# Patient Record
Sex: Male | Born: 1935
Health system: Southern US, Community
[De-identification: ages and names within clinical notes are randomized; demographics above are authoritative.]

## PROBLEM LIST (undated history)

## (undated) DIAGNOSIS — I714 Abdominal aortic aneurysm, without rupture, unspecified: Secondary | ICD-10-CM

## (undated) DIAGNOSIS — I493 Ventricular premature depolarization: Secondary | ICD-10-CM

## (undated) DIAGNOSIS — I4891 Unspecified atrial fibrillation: Secondary | ICD-10-CM

## (undated) DIAGNOSIS — N529 Male erectile dysfunction, unspecified: Secondary | ICD-10-CM

## (undated) DIAGNOSIS — I4510 Unspecified right bundle-branch block: Secondary | ICD-10-CM

## (undated) DIAGNOSIS — E785 Hyperlipidemia, unspecified: Secondary | ICD-10-CM

## (undated) DIAGNOSIS — I499 Cardiac arrhythmia, unspecified: Secondary | ICD-10-CM

## (undated) DIAGNOSIS — I77810 Thoracic aortic ectasia: Secondary | ICD-10-CM

## (undated) DIAGNOSIS — E669 Obesity, unspecified: Secondary | ICD-10-CM

## (undated) DIAGNOSIS — G4730 Sleep apnea, unspecified: Secondary | ICD-10-CM

## (undated) DIAGNOSIS — Z95828 Presence of other vascular implants and grafts: Secondary | ICD-10-CM

## (undated) DIAGNOSIS — I2510 Atherosclerotic heart disease of native coronary artery without angina pectoris: Secondary | ICD-10-CM

## (undated) DIAGNOSIS — Z8679 Personal history of other diseases of the circulatory system: Secondary | ICD-10-CM

## (undated) DIAGNOSIS — I429 Cardiomyopathy, unspecified: Secondary | ICD-10-CM

## (undated) DIAGNOSIS — Z9289 Personal history of other medical treatment: Secondary | ICD-10-CM

## (undated) DIAGNOSIS — R739 Hyperglycemia, unspecified: Secondary | ICD-10-CM

## (undated) DIAGNOSIS — K449 Diaphragmatic hernia without obstruction or gangrene: Secondary | ICD-10-CM

## (undated) DIAGNOSIS — E039 Hypothyroidism, unspecified: Secondary | ICD-10-CM

## (undated) DIAGNOSIS — N189 Chronic kidney disease, unspecified: Secondary | ICD-10-CM

## (undated) DIAGNOSIS — I10 Essential (primary) hypertension: Secondary | ICD-10-CM

## (undated) DIAGNOSIS — C679 Malignant neoplasm of bladder, unspecified: Secondary | ICD-10-CM

## (undated) DIAGNOSIS — M1990 Unspecified osteoarthritis, unspecified site: Secondary | ICD-10-CM

## (undated) DIAGNOSIS — C61 Malignant neoplasm of prostate: Secondary | ICD-10-CM

## (undated) DIAGNOSIS — J449 Chronic obstructive pulmonary disease, unspecified: Secondary | ICD-10-CM

## (undated) HISTORY — DX: Ventricular premature depolarization: I49.3

## (undated) HISTORY — DX: Atherosclerotic heart disease of native coronary artery without angina pectoris: I25.10

## (undated) HISTORY — DX: Cardiomyopathy, unspecified: I42.9

## (undated) HISTORY — DX: Essential (primary) hypertension: I10

## (undated) HISTORY — DX: Personal history of other medical treatment: Z92.89

## (undated) HISTORY — PX: CARDIOVASCULAR STRESS TEST: SHX262

## (undated) HISTORY — DX: Male erectile dysfunction, unspecified: N52.9

## (undated) HISTORY — DX: Abdominal aortic aneurysm, without rupture, unspecified: I71.40

## (undated) HISTORY — DX: Thoracic aortic ectasia: I77.810

## (undated) HISTORY — DX: Unspecified atrial fibrillation: I48.91

## (undated) HISTORY — DX: Unspecified right bundle-branch block: I45.10

## (undated) HISTORY — DX: Hyperglycemia, unspecified: R73.9

## (undated) HISTORY — DX: Hyperlipidemia, unspecified: E78.5

## (undated) HISTORY — DX: Hypothyroidism, unspecified: E03.9

## (undated) HISTORY — DX: Chronic obstructive pulmonary disease, unspecified: J44.9

## (undated) HISTORY — DX: Abdominal aortic aneurysm, without rupture: I71.4

## (undated) HISTORY — DX: Obesity, unspecified: E66.9

---

## 1990-02-28 HISTORY — PX: HIATAL HERNIA REPAIR: SHX195

## 1990-03-31 HISTORY — PX: CARDIAC CATHETERIZATION: SHX172

## 1990-05-30 HISTORY — PX: CORONARY ARTERY BYPASS GRAFT: SHX141

## 1995-03-01 HISTORY — PX: INGUINAL HERNIA REPAIR: SUR1180

## 1999-04-29 ENCOUNTER — Encounter: Payer: Self-pay | Admitting: Family Medicine

## 1999-04-29 LAB — CONVERTED CEMR LAB: PSA: 0.4 ng/mL

## 2000-04-28 ENCOUNTER — Encounter: Payer: Self-pay | Admitting: Family Medicine

## 2000-04-28 LAB — CONVERTED CEMR LAB: PSA: 0.33 ng/mL

## 2000-10-31 HISTORY — PX: OTHER SURGICAL HISTORY: SHX169

## 2001-05-29 ENCOUNTER — Encounter: Payer: Self-pay | Admitting: Family Medicine

## 2001-05-29 LAB — CONVERTED CEMR LAB: PSA: 0.3 ng/mL

## 2002-03-28 ENCOUNTER — Encounter: Payer: Self-pay | Admitting: Cardiology

## 2002-03-28 ENCOUNTER — Encounter: Payer: Self-pay | Admitting: Internal Medicine

## 2002-03-28 ENCOUNTER — Inpatient Hospital Stay (HOSPITAL_COMMUNITY): Admission: EM | Admit: 2002-03-28 | Discharge: 2002-03-29 | Payer: Self-pay | Admitting: Emergency Medicine

## 2002-03-28 HISTORY — PX: OTHER SURGICAL HISTORY: SHX169

## 2003-06-29 ENCOUNTER — Encounter: Payer: Self-pay | Admitting: Family Medicine

## 2003-06-29 LAB — CONVERTED CEMR LAB: PSA: 0.3 ng/mL

## 2003-12-31 ENCOUNTER — Ambulatory Visit: Payer: Self-pay | Admitting: Family Medicine

## 2004-06-28 ENCOUNTER — Ambulatory Visit: Payer: Self-pay | Admitting: Family Medicine

## 2004-06-28 LAB — CONVERTED CEMR LAB: PSA: 0.32 ng/mL

## 2004-06-29 ENCOUNTER — Ambulatory Visit: Payer: Self-pay | Admitting: Family Medicine

## 2004-07-09 ENCOUNTER — Ambulatory Visit: Payer: Self-pay | Admitting: Family Medicine

## 2005-01-04 ENCOUNTER — Ambulatory Visit: Payer: Self-pay | Admitting: Family Medicine

## 2005-07-05 ENCOUNTER — Ambulatory Visit: Payer: Self-pay | Admitting: Family Medicine

## 2005-07-05 LAB — CONVERTED CEMR LAB: PSA: 0.42 ng/mL

## 2005-07-06 ENCOUNTER — Ambulatory Visit: Payer: Self-pay | Admitting: Family Medicine

## 2005-10-24 ENCOUNTER — Ambulatory Visit: Payer: Self-pay | Admitting: Gastroenterology

## 2005-11-07 ENCOUNTER — Ambulatory Visit: Payer: Self-pay | Admitting: Gastroenterology

## 2006-01-09 ENCOUNTER — Ambulatory Visit: Payer: Self-pay | Admitting: Family Medicine

## 2006-06-29 ENCOUNTER — Encounter: Payer: Self-pay | Admitting: Family Medicine

## 2006-06-29 DIAGNOSIS — E039 Hypothyroidism, unspecified: Secondary | ICD-10-CM | POA: Insufficient documentation

## 2006-06-29 DIAGNOSIS — R739 Hyperglycemia, unspecified: Secondary | ICD-10-CM

## 2006-06-29 DIAGNOSIS — I10 Essential (primary) hypertension: Secondary | ICD-10-CM

## 2006-06-29 DIAGNOSIS — I2510 Atherosclerotic heart disease of native coronary artery without angina pectoris: Secondary | ICD-10-CM

## 2006-06-29 DIAGNOSIS — I4821 Permanent atrial fibrillation: Secondary | ICD-10-CM

## 2006-06-29 DIAGNOSIS — E669 Obesity, unspecified: Secondary | ICD-10-CM | POA: Insufficient documentation

## 2006-07-04 ENCOUNTER — Ambulatory Visit: Payer: Self-pay | Admitting: Family Medicine

## 2006-07-04 LAB — CONVERTED CEMR LAB
ALT: 15 units/L (ref 0–40)
AST: 15 units/L (ref 0–37)
Albumin: 3.8 g/dL (ref 3.5–5.2)
Alkaline Phosphatase: 63 units/L (ref 39–117)
BUN: 10 mg/dL (ref 6–23)
Bilirubin, Direct: 0.1 mg/dL (ref 0.0–0.3)
CO2: 28 meq/L (ref 19–32)
Calcium: 9.2 mg/dL (ref 8.4–10.5)
Chloride: 106 meq/L (ref 96–112)
Cholesterol: 168 mg/dL (ref 0–200)
Creatinine, Ser: 1 mg/dL (ref 0.4–1.5)
Creatinine,U: 155.3 mg/dL
Direct LDL: 94.3 mg/dL
Free T4: 0.7 ng/dL (ref 0.6–1.6)
GFR calc Af Amer: 95 mL/min
GFR calc non Af Amer: 79 mL/min
Glucose, Bld: 99 mg/dL (ref 70–99)
HDL: 32.8 mg/dL — ABNORMAL LOW (ref >39.0)
Microalb Creat Ratio: 5.8 mg/g (ref 0.0–30.0)
Microalb, Ur: 0.9 mg/dL (ref 0.0–1.9)
PSA: 0.44 ng/mL (ref 0.10–4.00)
Potassium: 4.7 meq/L (ref 3.5–5.1)
Sodium: 141 meq/L (ref 135–145)
TSH: 3.11 microintl units/mL (ref 0.35–5.50)
Total Bilirubin: 1.1 mg/dL (ref 0.3–1.2)
Total CHOL/HDL Ratio: 5.1
Total Protein: 7.4 g/dL (ref 6.0–8.3)
Triglycerides: 215 mg/dL (ref 0–149)
VLDL: 43 mg/dL — ABNORMAL HIGH (ref 0–40)

## 2006-07-11 ENCOUNTER — Ambulatory Visit: Payer: Self-pay | Admitting: Family Medicine

## 2006-09-27 ENCOUNTER — Ambulatory Visit: Payer: Self-pay | Admitting: Family Medicine

## 2007-10-09 ENCOUNTER — Ambulatory Visit: Payer: Self-pay | Admitting: Family Medicine

## 2007-10-11 LAB — CONVERTED CEMR LAB
ALT: 14 units/L (ref 0–53)
AST: 14 units/L (ref 0–37)
Albumin: 4 g/dL (ref 3.5–5.2)
Alkaline Phosphatase: 63 units/L (ref 39–117)
BUN: 13 mg/dL (ref 6–23)
Basophils Absolute: 0.1 10*3/uL (ref 0.0–0.1)
Basophils Relative: 0.8 % (ref 0.0–3.0)
Bilirubin, Direct: 0.2 mg/dL (ref 0.0–0.3)
CO2: 25 meq/L (ref 19–32)
Calcium: 9.2 mg/dL (ref 8.4–10.5)
Chloride: 106 meq/L (ref 96–112)
Cholesterol: 164 mg/dL (ref 0–200)
Creatinine, Ser: 1 mg/dL (ref 0.4–1.5)
Creatinine,U: 226.8 mg/dL
Eosinophils Absolute: 0.1 10*3/uL (ref 0.0–0.7)
Eosinophils Relative: 2.2 % (ref 0.0–5.0)
GFR calc Af Amer: 95 mL/min
GFR calc non Af Amer: 78 mL/min
Glucose, Bld: 97 mg/dL (ref 70–99)
HCT: 42.4 % (ref 39.0–52.0)
HDL: 32.9 mg/dL — ABNORMAL LOW (ref >39.0)
Hemoglobin: 14.6 g/dL (ref 13.0–17.0)
Hgb A1c MFr Bld: 6 % (ref 4.6–6.0)
LDL Cholesterol: 92 mg/dL (ref 0–99)
Lymphocytes Relative: 26.8 % (ref 12.0–46.0)
MCHC: 34.4 g/dL (ref 30.0–36.0)
MCV: 91.2 fL (ref 78.0–100.0)
Microalb Creat Ratio: 7.1 mg/g (ref 0.0–30.0)
Microalb, Ur: 1.6 mg/dL (ref 0.0–1.9)
Monocytes Absolute: 0.5 10*3/uL (ref 0.1–1.0)
Monocytes Relative: 8.3 % (ref 3.0–12.0)
Neutro Abs: 4 10*3/uL (ref 1.4–7.7)
Neutrophils Relative %: 61.9 % (ref 43.0–77.0)
PSA: 0.31 ng/mL (ref 0.10–4.00)
Platelets: 250 10*3/uL (ref 150–400)
Potassium: 4 meq/L (ref 3.5–5.1)
RBC: 4.65 M/uL (ref 4.22–5.81)
RDW: 13.9 % (ref 11.5–14.6)
Sodium: 140 meq/L (ref 135–145)
TSH: 3.33 microintl units/mL (ref 0.35–5.50)
Total Bilirubin: 1.2 mg/dL (ref 0.3–1.2)
Total CHOL/HDL Ratio: 5
Total Protein: 7.4 g/dL (ref 6.0–8.3)
Triglycerides: 195 mg/dL — ABNORMAL HIGH (ref 0–149)
VLDL: 39 mg/dL (ref 0–40)
WBC: 6.4 10*3/uL (ref 4.5–10.5)

## 2007-10-16 ENCOUNTER — Ambulatory Visit: Payer: Self-pay | Admitting: Family Medicine

## 2007-11-02 ENCOUNTER — Ambulatory Visit: Payer: Self-pay | Admitting: Family Medicine

## 2007-11-02 ENCOUNTER — Encounter (INDEPENDENT_AMBULATORY_CARE_PROVIDER_SITE_OTHER): Payer: Self-pay | Admitting: *Deleted

## 2007-11-02 LAB — FECAL OCCULT BLOOD, GUAIAC: Fecal Occult Blood: NEGATIVE

## 2007-11-02 LAB — CONVERTED CEMR LAB
OCCULT 1: NEGATIVE
OCCULT 2: NEGATIVE
OCCULT 3: NEGATIVE

## 2008-03-13 ENCOUNTER — Telehealth: Payer: Self-pay | Admitting: Family Medicine

## 2008-03-18 ENCOUNTER — Ambulatory Visit: Payer: Self-pay | Admitting: Family Medicine

## 2008-10-16 ENCOUNTER — Ambulatory Visit: Payer: Self-pay | Admitting: Family Medicine

## 2008-10-16 LAB — CONVERTED CEMR LAB
ALT: 13 units/L (ref 0–53)
Albumin: 4.1 g/dL (ref 3.5–5.2)
Alkaline Phosphatase: 63 units/L (ref 39–117)
Basophils Absolute: 0 10*3/uL (ref 0.0–0.1)
Calcium: 9 mg/dL (ref 8.4–10.5)
Cholesterol: 160 mg/dL (ref 0–200)
Creatinine,U: 168.9 mg/dL
Direct LDL: 91.5 mg/dL
Eosinophils Absolute: 0.1 10*3/uL (ref 0.0–0.7)
GFR calc non Af Amer: 77.92 mL/min (ref >60)
HDL: 33.6 mg/dL — ABNORMAL LOW (ref >39.00)
Hemoglobin: 14.6 g/dL (ref 13.0–17.0)
Lymphocytes Relative: 31.2 % (ref 12.0–46.0)
MCHC: 33.2 g/dL (ref 30.0–36.0)
Microalb Creat Ratio: 7.1 mg/g (ref 0.0–30.0)
Monocytes Relative: 7.4 % (ref 3.0–12.0)
Neutro Abs: 3.7 10*3/uL (ref 1.4–7.7)
Neutrophils Relative %: 59.4 % (ref 43.0–77.0)
PSA: 0.39 ng/mL (ref 0.10–4.00)
Potassium: 3.8 meq/L (ref 3.5–5.1)
RBC: 4.77 M/uL (ref 4.22–5.81)
RDW: 13 % (ref 11.5–14.6)
Sodium: 139 meq/L (ref 135–145)
Total Protein: 7.7 g/dL (ref 6.0–8.3)
VLDL: 43.4 mg/dL — ABNORMAL HIGH (ref 0.0–40.0)

## 2008-10-23 ENCOUNTER — Ambulatory Visit: Payer: Self-pay | Admitting: Family Medicine

## 2008-12-18 ENCOUNTER — Ambulatory Visit: Payer: Self-pay | Admitting: Family Medicine

## 2009-10-06 ENCOUNTER — Encounter (INDEPENDENT_AMBULATORY_CARE_PROVIDER_SITE_OTHER): Payer: Self-pay | Admitting: *Deleted

## 2009-10-20 ENCOUNTER — Ambulatory Visit: Payer: Self-pay | Admitting: Family Medicine

## 2009-10-20 ENCOUNTER — Telehealth (INDEPENDENT_AMBULATORY_CARE_PROVIDER_SITE_OTHER): Payer: Self-pay | Admitting: *Deleted

## 2009-10-20 LAB — CONVERTED CEMR LAB
ALT: 14 units/L (ref 0–53)
AST: 16 units/L (ref 0–37)
Albumin: 4.3 g/dL (ref 3.5–5.2)
Alkaline Phosphatase: 68 units/L (ref 39–117)
BUN: 13 mg/dL (ref 6–23)
CO2: 28 meq/L (ref 19–32)
Chloride: 105 meq/L (ref 96–112)
Cholesterol: 172 mg/dL (ref 0–200)
GFR calc non Af Amer: 80.49 mL/min (ref >60)
Glucose, Bld: 100 mg/dL — ABNORMAL HIGH (ref 70–99)
Potassium: 4.4 meq/L (ref 3.5–5.1)
Sodium: 143 meq/L (ref 135–145)
Total Protein: 7.5 g/dL (ref 6.0–8.3)
VLDL: 48.6 mg/dL — ABNORMAL HIGH (ref 0.0–40.0)

## 2009-10-27 ENCOUNTER — Ambulatory Visit: Payer: Self-pay | Admitting: Family Medicine

## 2010-02-28 HISTORY — PX: ABDOMINAL AORTIC ANEURYSM REPAIR: SUR1152

## 2010-04-01 NOTE — Letter (Signed)
Summary: Nadara Eaton letter  [place] at [facility]  [address] [place], [place] [address]   Phone: [phone]  Fax: [phone]       10/06/2009 MRN: [id number]  Zhyon Fernando [address] [place], [place]  [address]  Dear Mr. Lyla [facility] [place], and [facility] announce the retirement of Arta Silence, M.D., from full-time practice at the [facility] office effective August 27, 2009 and his plans of returning part-time.  It is important to Dr. Hetty Ely and to our practice that you understand that [facility] has seven physicians in our office for your health care needs.  We will continue to offer the same exceptional care that you have today.    Dr. Hetty Ely has spoken to many of you about his plans for retirement and returning part-time in the fall.   We will continue to work with you through the transition to schedule appointments for you in the office and meet the high standards that [place] is committed to.   Again, it is with great pleasure that we share the news that Dr. Hetty Ely will return to [facility] at [facility] in October of 2011 with a reduced schedule.    If you have any questions, or would like to request an appointment with one of our physicians, please call us at [phone] and press the option for Scheduling an appointment.  We take pleasure in providing you with excellent patient care and look forward to seeing you at your next office visit.  Our [facility] Physicians are:  Tillman Abide, M.D. Laurita Quint, M.D. Roxy Manns, M.D. Kerby Nora, M.D. Hannah Beat, M.D. Ruthe Mannan, M.D. We proudly welcomed Raechel Ache, M.D. and Eustaquio Boyden, M.D. to the practice in July/August 2011.  Sincerely,  [place] [facility]

## 2010-04-01 NOTE — Progress Notes (Signed)
----   Converted from flag ---- ---- 10/19/2009 9:58 PM, Crawford Givens MD wrote: tsh 244.9 psa v76.44 cmet 272.4 lipid 272.4  ---- 10/19/2009 10:51 AM, Mills Koller wrote: This patient is scheduled for CPX with you, I need lab orders with dx, please. Thanks, Terri ------------------------------

## 2010-04-01 NOTE — Assessment & Plan Note (Signed)
Summary: YEARLY CPX... PREVIOUS SCHALLER PT   Vital Signs:  Patient profile:   77 year old male Height:      71 inches Weight:      245.25 pounds BMI:     34.33 Temp:     98.3 degrees F oral Pulse rate:   80 / minute Pulse rhythm:   regular BP sitting:   130 / 80  (left arm) Cuff size:   large  Vitals Entered By: Delilah Shan CMA Duncan Dull) (October 27, 2009 8:31 AM) CC: CPX (RNS pt), Preventive Care  Vision Screening:Left eye with correction: 20 / 15 Right eye with correction: 20 / 15 Both eyes with correction: 20 / 15        Vision Entered By: Delilah Shan CMA (AAMA) (October 27, 2009 9:30 AM)   History of Present Illness: Here for Medicare AWV:  1.   Risk factors based on Past M, S, F history:see below 2.   Physical Activities: not impaired 3.   Depression/mood: no sx 4.   Hearing: intact 5.   ADL's: independent 6.   Fall Risk: low 7.   Home Safety: low risk  8.   Height, weight, &visual acuity: as below 9.   Counseling: see plan 10.   Labs ordered based on risk factors: see labs 11.           Referral Coordination: see plan 12.           Care Plan= see plan 13.           Cognitive Impairment assessment: Memory, motor skills, A&Ox 3, language test and attention span all intact   Hypertension:      Using medication without problems or lightheadedness: yes Chest pain with exertion:no Edema:no Short of breath:no Average home BPs:not checked except rarely at drug store- it was not elevated then Other issues: no  Elevated Cholesterol: Using medications without problems:yes Muscle aches: no Other complaints: no  Hypothyroid- no dysphagia, pain with swallowing.  Occ with "Petrovic" in throat, esp after cutting grass.  Occ hoarse after prolonged talking.  Former smoker.    CAD- Dr. Riley Kill, doing well w/o CP.   Allergies: No Known Drug Allergies  Past History:  Past Surgical History: Last updated: 07/11/2006 HOSP R/O 1988 L HERNIORRAPHY 1997 Flex. negative  06/97 CATH POS  02/92 CABG x 7 04/92 ETT WNL 10/31/00 HOSP AFIB 03/28/02 SPECT WALL MOTION CARDIOLITE NEG  INFEROBAS DEFECT  HTNSVE   2003 Colonoscopy multi polyps bx. neg., int. hemorrhoids 10/14/02 and 2007 Colonoscopy WNL 11/07/05          2012  Family History: Last updated: 06/29/2006 FATHER DEC UNKNOWN MOTHER DEC 68 DM HTN OBESITY SISTER DEC 46 ETOH  Social History: Last updated: 10/27/2009 Occupation: [employer]  Retired 2001 Married, wife had lung CA 2009 Divorced Remarried lives w/ wife 3 children (first Sports administrator) From [place] quit smoking 1992 minimal alcohol likes to play golf, travels with motor home  Past Medical History: Coronary artery disease- Dr. Riley Kill Hyperlipidemia Hypertension Hypothyroidism  Family History: Reviewed history from 06/29/2006 and no changes required. FATHER DEC UNKNOWN MOTHER DEC 68 DM HTN OBESITY SISTER DEC 46 ETOH  Social History: Reviewed history from 06/29/2006 and no changes required. Occupation: [employer]  Retired 2001 Married, wife had lung CA 2009 Divorced Remarried lives w/ wife 3 children (first marr) From [place] quit smoking 1992 minimal alcohol likes to play golf, travels with motor home  Review of Systems  See HPI.  Otherwise negative.    Physical Exam  General:  GEN: nad, alert and oriented HEENT: mucous membranes moist NECK: supple w/o LA CV: rrr.  no murmur PULM: ctab, no inc wob ABD: soft, +bs EXT: no edema SKIN: no acute rash  Rectal:  No external abnormalities noted. Normal sphincter tone. No rectal masses or tenderness. Prostate:  Prostate gland firm and smooth, mild enlargement,  but no nodularity, tenderness, mass, asymmetry or induration.     Impression & Recommendations:  Problem # 1:  Preventive Health Care (ICD-V70.0) See prev med.  I offered ENT eval today for pharyngeal symptoms even though exam was benign.  His symptoms are improving and he wanted to see if  they totally resolve.  If they don't, he'll call  back to set up referral.   Problem # 2:  SCREENING FOR MALIGNANT NEOPLASM, PROSTATE (ICD-V76.44) PSA wnl.  D/ wpatient   Problem # 3:  HYPOTHYROIDISM NOS (ICD-244.9) No change in meds.  His updated medication list for this problem includes:    Levoxyl 137 Mcg Tabs (Levothyroxine sodium) .Marland Kitchen... Take one by mouth daily  Problem # 4:  HYPERTENSION (ICD-401.9) Controlled and labs reviewed with patient.  His updated medication list for this problem includes:    Verapamil Hcl Cr 240 Mg Tbcr (Verapamil hcl) .Marland Kitchen... Take one by mouth daily    Altace 5 Mg Caps (Ramipril) .Marland Kitchen... 1 daily by mouth  Problem # 5:  HYPERLIPIDEMIA- (214/TRIG490) (ICD-272.4) d/w patient.  No change in meds.  Pt to increase his exercise routine.   His updated medication list for this problem includes:    Lipitor 40 Mg Tabs (Atorvastatin calcium) .Marland Kitchen... Takes 1/2 tablet at bedtime by mouth  Problem # 6:  CORONARY ARTERY DISEASE (ICD-414.00) Stable by symptoms.  No change in meds.  His updated medication list for this problem includes:    Verapamil Hcl Cr 240 Mg Tbcr (Verapamil hcl) .Marland Kitchen... Take one by mouth daily    Aspirin 325 Mg Tabs (Aspirin) .Marland Kitchen... Take one by mouth every other day    Altace 5 Mg Caps (Ramipril) .Marland Kitchen... 1 daily by mouth  Complete Medication List: 1)  Lipitor 40 Mg Tabs (Atorvastatin calcium) .... Takes 1/2 tablet at bedtime by mouth 2)  Verapamil Hcl Cr 240 Mg Tbcr (Verapamil hcl) .... Take one by mouth daily 3)  Levoxyl 137 Mcg Tabs (Levothyroxine sodium) .... Take one by mouth daily 4)  Aspirin 325 Mg Tabs (Aspirin) .... Take one by mouth every other day 5)  Altace 5 Mg Caps (Ramipril) .Marland Kitchen.. 1 daily by mouth  Other Orders: Medicare -1st Annual Wellness Visit [phone]) Flu Vaccine 58yrs + [phone]) Administration Flu vaccine - MCR [phone])  Colorectal Screening:  Current Recommendations:    Hemoccult: NEG X 1 today  Colonoscopy Results:    Date of  Exam: 11/07/2005    Results: benign polyps, int hemorrhoids  Next Colonoscopy Due:    11/08/2010  PSA Screening:    PSA: 0.47  (10/20/2009)  Immunization & Chemoprophylaxis:    Tetanus vaccine: Td  (09/27/2006)    Influenza vaccine: Fluvax 3+  (10/27/2009)    Pneumovax: Pneumovax  (03/01/1999)  Patient Instructions: 1)  Check with your insurance to see if they will cover the shingles shot.  Let me know if the symptoms in your throat don't resolve totally.  We can set you up with ENT at that point.  Take care.  Glad to see you.   2)  Please schedule a follow-up  appointment in 6 month with fasting lipid before OV (dx 272.0)   Current Allergies (reviewed today): No known allergies      Prevention & Chronic Care Immunizations   Influenza vaccine: Fluvax 3+  (10/27/2009)    Tetanus booster: 09/27/2006: Td   Tetanus booster due: 09/26/2016    Pneumococcal vaccine: Pneumovax  (03/01/1999)    H. zoster vaccine: Not documented  Colorectal Screening   Hemoccult: negative  (11/02/2007)   Hemoccult action/deferral: NEG X 1 today  (10/27/2009)    Colonoscopy: benign polyps, int hemorrhoids  (11/07/2005)   Colonoscopy due: 10/2010  Other Screening   PSA: 0.47  (10/20/2009)   PSA due due: 10/21/2010   Smoking status: quit  (10/23/2008)  Lipids   Total Cholesterol: 172  (10/20/2009)   LDL: 92  (10/09/2007)   LDL Direct: 99.5  (10/20/2009)   HDL: 36.10  (10/20/2009)   Triglycerides: 243.0  (10/20/2009)    SGOT (AST): 16  (10/20/2009)   SGPT (ALT): 14  (10/20/2009)   Alkaline phosphatase: 68  (10/20/2009)   Total bilirubin: 1.3  (10/20/2009)    Lipid flowsheet reviewed?: Yes  Hypertension   Last Blood Pressure: 130 / 80  (10/27/2009)   Serum creatinine: 1.0  (10/20/2009)   Serum potassium 4.4  (10/20/2009)    Hypertension flowsheet reviewed?: Yes   Progress toward BP goal: At goal  Self-Management Support :    Hypertension self-management support: Not  documented    Lipid self-management support: Not documented   Flu Vaccine Consent Questions     Do you have a history of severe allergic reactions to this vaccine? no    Any prior history of allergic reactions to egg and/or gelatin? no    Do you have a sensitivity to the preservative Thimersol? no    Do you have a past history of Guillan-Barre Syndrome? no    Do you currently have an acute febrile illness? no    Have you ever had a severe reaction to latex? no    Vaccine information given and explained to patient? yes    Are you currently pregnant? no    Lot Number:[id number]   Exp Date:08/28/2010   Site Given  Left Deltoid IMlu Lugene Fuquay CMA (AAMA)  October 27, 2009 9:31 AM

## 2010-04-26 ENCOUNTER — Other Ambulatory Visit (INDEPENDENT_AMBULATORY_CARE_PROVIDER_SITE_OTHER): Payer: Medicare Other

## 2010-04-26 ENCOUNTER — Encounter (INDEPENDENT_AMBULATORY_CARE_PROVIDER_SITE_OTHER): Payer: Self-pay | Admitting: *Deleted

## 2010-04-26 ENCOUNTER — Other Ambulatory Visit: Payer: Self-pay | Admitting: Family Medicine

## 2010-04-26 DIAGNOSIS — E785 Hyperlipidemia, unspecified: Secondary | ICD-10-CM

## 2010-04-26 LAB — LIPID PANEL
Cholesterol: 162 mg/dL (ref 0–200)
HDL: 34.4 mg/dL — ABNORMAL LOW (ref >39.00)
LDL Cholesterol: 91 mg/dL (ref 0–99)
Total CHOL/HDL Ratio: 5
Triglycerides: 185 mg/dL — ABNORMAL HIGH (ref 0.0–149.0)

## 2010-04-29 ENCOUNTER — Ambulatory Visit (INDEPENDENT_AMBULATORY_CARE_PROVIDER_SITE_OTHER): Payer: Medicare Other | Admitting: Family Medicine

## 2010-04-29 ENCOUNTER — Encounter: Payer: Self-pay | Admitting: Family Medicine

## 2010-04-29 DIAGNOSIS — I10 Essential (primary) hypertension: Secondary | ICD-10-CM

## 2010-04-29 DIAGNOSIS — N529 Male erectile dysfunction, unspecified: Secondary | ICD-10-CM | POA: Insufficient documentation

## 2010-04-29 DIAGNOSIS — I2510 Atherosclerotic heart disease of native coronary artery without angina pectoris: Secondary | ICD-10-CM

## 2010-04-29 DIAGNOSIS — Z23 Encounter for immunization: Secondary | ICD-10-CM

## 2010-04-29 DIAGNOSIS — E785 Hyperlipidemia, unspecified: Secondary | ICD-10-CM

## 2010-05-04 ENCOUNTER — Telehealth (INDEPENDENT_AMBULATORY_CARE_PROVIDER_SITE_OTHER): Payer: Self-pay | Admitting: *Deleted

## 2010-05-06 NOTE — Assessment & Plan Note (Signed)
Summary: 6 MTH FU/RBH   Vital Signs:  Patient profile:   77 year old male Height:      71 inches Weight:      237.50 pounds BMI:     33.24 Temp:     97.9 degrees F oral Pulse rate:   76 / minute Pulse rhythm:   regular BP sitting:   130 / 62  (left arm) Cuff size:   large  Vitals Entered By: Delilah Shan CMA Yordi Krager Dull) (April 29, 2010 8:07 AM) CC: 6 months follow up   History of Present Illness: ED- libido is good but function is not improved.  Dec in AM erection, but this is better than at night.   Elevated Cholesterol: Using medications without problems:yes Muscle aches: no Other complaints:no  Hypertension:      Using medication without problems or lightheadedness: yes Chest pain with exertion:no Edema:no Short of breath:no Other issues:no Exercising with walking and golf  Occ knee pain with exercise.  He will occ taking some advil, very minimal use.  Gi caution for nsaids.    PAF was only after CABG  ~20 years ago.  None in the meantime (for years) since he cut out caffeine.    Allergies: No Known Drug Allergies  Past History:  Past Medical History: ORGANIC IMPOTENCE (ICD-607.84) HEALTH MAINTENANCE EXAM (ICD-V70.0) SPECIAL SCREENING MALIG NEOPLASMS OTHER SITES (ICD-V76.49) SCREENING FOR MALIGNANT NEOPLASM, PROSTATE (ICD-V76.44) VENTRICULAR ECTOPY- MILD (ICD-427.69) HYPERGLYCEMIA (ICD-790.6) OBESITY (ICD-278.00) PAROXYSMAL ATRIAL FIBRILLATION (ICD-427.31) HYPOTHYROIDISM NOS (ICD-244.9) HYPERTENSION (ICD-401.9) HYPERLIPIDEMIA- (214/TRIG490) (ICD-272.4) CORONARY ARTERY DISEASE (ICD-414.00)-Dr. Stuckey    Past Surgical History: HOSP R/O 1988 L HERNIORRAPHY 1997 Flex. negative 06/97 CATH POS  02/92 CABG x 7 04/92 ETT WNL 10/31/00 HOSP AFIB 03/28/02 SPECT WALL MOTION CARDIOLITE NEG  INFEROBAS DEFECT  HTNSVE   2003 Colonoscopy multi polyps bx. neg., int. hemorrhoids 10/14/02 and 2007 Colonoscopy WNL 11/07/05  Social History: Reviewed history from  10/27/2009 and no changes required. Occupation: [employer]  Retired 2001 Married, wife had lung CA 2009 Divorced Remarried lives w/ wife 3 children (first marr) From [place] quit smoking 1992 minimal alcohol likes to play golf, travels with motor home  Review of Systems       See HPI.  Otherwise negative.    Physical Exam  General:  GEN: nad, alert and oriented HEENT: mucous membranes moist NECK: supple w/o LA CV: rrr.  no murmur PULM: ctab, no inc wob ABD: soft, +bs EXT: no edema SKIN: no acute rash    Impression & Recommendations:  Problem # 1:  HYPERTENSION (ICD-401.9) controlled, no change in med.s   His updated medication list for this problem includes:    Verapamil Hcl Cr 240 Mg Tbcr (Verapamil hcl) .Marland Kitchen... Take one by mouth daily    Altace 5 Mg Caps (Ramipril) .Marland Kitchen... 1 daily by mouth  Problem # 2:  HYPERLIPIDEMIA- (214/TRIG490) (ICD-272.4) labs reviewed with patient.  No change in meds.  His updated medication list for this problem includes:    Lipitor 40 Mg Tabs (Atorvastatin calcium) .Marland Kitchen... Takes 1/2 tablet at bedtime by mouth  Problem # 3:  CORONARY ARTERY DISEASE (ICD-414.00) will [facility] to [facility] to reest care/contact.  Feeling well.  His updated medication list for this problem includes:    Verapamil Hcl Cr 240 Mg Tbcr (Verapamil hcl) .Marland Kitchen... Take one by mouth daily    Aspirin 325 Mg Tabs (Aspirin) .Marland Kitchen... Take one by mouth every other day    Altace 5 Mg Caps (Ramipril) .Marland Kitchen... 1 daily  by mouth  Orders: Cardiology Referral (Cardiology)  Problem # 4:  ORGANIC IMPOTENCE (ICD-607.84) Routine instructions given.  No NTG use and no contraindications.  follow up as needed for this, he agrees. His updated medication list for this problem includes:    Viagra 50 Mg Tabs (Sildenafil citrate) .Marland Kitchen... 1 by mouth once daily as needed  Orders: Prescription Created Electronically [phone])  Complete Medication List: 1)  Lipitor 40 Mg Tabs (Atorvastatin calcium)  .... Takes 1/2 tablet at bedtime by mouth 2)  Verapamil Hcl Cr 240 Mg Tbcr (Verapamil hcl) .... Take one by mouth daily 3)  Levoxyl 137 Mcg Tabs (Levothyroxine sodium) .... Take one by mouth daily 4)  Aspirin 325 Mg Tabs (Aspirin) .... Take one by mouth every other day 5)  Altace 5 Mg Caps (Ramipril) .Marland Kitchen.. 1 daily by mouth 6)  Viagra 50 Mg Tabs (Sildenafil citrate) .Marland Kitchen.. 1 by mouth once daily as needed  Other Orders: Zoster (Shingles) Vaccine Live [phone]) Admin 1st Vaccine (78469)  Patient Instructions: 1)  Physical in 6 months.  2)  See Shirlee Limerick about your referral before your leave today.  3)  Don't change your meds (except for the viagra).  Let me know if you have any difficulties with the viagra use.   4)  Take care.  Keep exercising.  Glad to see you today.  Prescriptions: VIAGRA 50 MG TABS (SILDENAFIL CITRATE) 1 by mouth once daily as needed  #10 x 5   Entered and Authorized by:   Crawford Givens MD   Signed by:   Crawford Givens MD on 04/29/2010   Method used:   Electronically to        [facility]  [address] [id number] (retail[facility]       [address]       [place], [place]  [address]       Ph: [phone] or [phone]       Fax: [phone]   RxID:   [id number]    Orders Added: 1)  Est. Patient Level IV [01601] 2)  Prescription Created Electronically [G8553] 3)  Zoster (Shingles) Vaccine Live [90736] 4)  Admin 1st Vaccine [90471] 5)  Cardiology Referral [Cardiology]   Immunizations Administered:  Zostavax # 1:    Vaccine Type: Zostavax    Site: Left arm    Mfr: Merck    Dose: 0.5 ml    Route: [place]    Given by: Delilah Shan CMA (AAMA)    Exp. Date: 10/16/2010    Lot #: [id number]    VIS given: 12/10/04 given April 29, 2010.   Immunizations Administered:  Zostavax # 1:    Vaccine Type: Zostavax    Site: Left arm    Mfr: Merck    Dose: 0.5 ml    Route:     Given by: Delilah Shan CMA (AAMA)    Exp. Date: 10/16/2010    Lot #: [id number]    VIS given: 12/10/04  given April 29, 2010.  Current Allergies (reviewed today): No known allergies

## 2010-05-11 NOTE — Progress Notes (Signed)
----   Converted from flag ---- ---- 04/30/2010 10:54 AM, Julieta Gutting, RN, BSN wrote: Dr Riley Kill will accept him back as a new pt consult.  Dr Riley Kill currently does not have availability until April for appointments.   Lauren RN  ---- 04/29/2010 8:57 AM, Daine Gip wrote: Per [name], This pt [name] has been a pt of Dr Riley Kill in years past (10 yrs) ago. His PCP is request a Cardiology consult.  Need to know if Dr. Gwenevere Abbot will accept him back? [name] requested that I send you this email.Marland KitchenMarland KitchenMarland KitchenAram Beecham  Dr Crawford Givens PCP,  [place].... ------------------------------

## 2010-06-01 ENCOUNTER — Encounter: Payer: Self-pay | Admitting: Cardiology

## 2010-06-07 ENCOUNTER — Encounter: Payer: Self-pay | Admitting: Cardiology

## 2010-06-09 ENCOUNTER — Ambulatory Visit (INDEPENDENT_AMBULATORY_CARE_PROVIDER_SITE_OTHER): Payer: Medicare Other | Admitting: Cardiology

## 2010-06-09 ENCOUNTER — Encounter: Payer: Self-pay | Admitting: Cardiology

## 2010-06-09 VITALS — BP 156/94 | HR 64 | Ht 72.0 in | Wt 238.0 lb

## 2010-06-09 DIAGNOSIS — I10 Essential (primary) hypertension: Secondary | ICD-10-CM

## 2010-06-09 DIAGNOSIS — I4891 Unspecified atrial fibrillation: Secondary | ICD-10-CM

## 2010-06-09 DIAGNOSIS — I2510 Atherosclerotic heart disease of native coronary artery without angina pectoris: Secondary | ICD-10-CM

## 2010-06-09 DIAGNOSIS — E039 Hypothyroidism, unspecified: Secondary | ICD-10-CM

## 2010-06-09 DIAGNOSIS — E785 Hyperlipidemia, unspecified: Secondary | ICD-10-CM

## 2010-06-09 LAB — TSH: TSH: 0.79 u[IU]/mL (ref 0.35–5.50)

## 2010-06-09 MED ORDER — PRAVASTATIN SODIUM 80 MG PO TABS: 80.0000 mg | ORAL_TABLET | Freq: Every day | ORAL | Status: DC

## 2010-06-09 NOTE — Patient Instructions (Signed)
Your physician has requested that you have an echocardiogram. Echocardiography is a painless test that uses sound waves to create images of your heart. It provides your doctor with information about the size and shape of your heart and how well your heart's chambers and valves are working. This procedure takes approximately one hour. There are no restrictions for this procedure.  Your physician recommends that you have lab work today: TSH, FREE T4 and FREE T3  Your physician recommends that you schedule a follow-up appointment in: 2 WEEKS  Your physician has recommended you make the following change in your medication: STOP Atorvastatin (Lipitor), START Pravastatin 80mg  one tablet by mouth at bedtime

## 2010-06-13 DIAGNOSIS — E785 Hyperlipidemia, unspecified: Secondary | ICD-10-CM | POA: Insufficient documentation

## 2010-06-13 NOTE — Assessment & Plan Note (Signed)
He has not had chest pain or shortness of breath.  Had CABG by Dr. Andrey Campanile and no further problems.  He has remained asymptomatic.

## 2010-06-13 NOTE — Assessment & Plan Note (Signed)
Will need to check thyroid levels, and control with atrial fib diagnosis.  I would suspect he is clinically euthyroid.

## 2010-06-13 NOTE — Assessment & Plan Note (Signed)
Is on atorva and verapamil.  As such, will change to pravachol, assess response to treatment, and go from there.

## 2010-06-13 NOTE — Progress Notes (Signed)
HPI:  [name] is seen in followup as a new evaluation.  Notably, he had not been seen in many years.  He has had a prior PCI, and then apparently CABG,  but that predates E chart, and his paper chart is not available, and we are requesting it.  He is not having chest pain, or shortness of breath.  He is able to do most anything.  Of interest he is able to get around, but we note today that he in fact is in atrial fibrillation.  He was unaware.  He has been maintained on ASA in the past.  He denies syncope.  He does not feel palpitations.   Current Outpatient Prescriptions  Medication Sig Dispense Refill  . aspirin 325 MG tablet Take 325 mg by mouth every other day.       . levothyroxine (SYNTHROID, LEVOTHROID) 137 MCG tablet Take 137 mcg by mouth daily.        . ramipril (ALTACE) 5 MG capsule Take 5 mg by mouth daily.        . sildenafil (VIAGRA) 50 MG tablet Take 50 mg by mouth daily as needed.        . verapamil (COVERA HS) 240 MG (CO) 24 hr tablet Take 240 mg by mouth at bedtime.        . pravastatin (PRAVACHOL) 80 MG tablet Take 1 tablet (80 mg total) by mouth daily.  90 tablet  3    No Known Allergies  Past Medical History  Diagnosis Date  . Special screening for malignant neoplasm of prostate   . Ventricular ectopy     mild  . Hyperglycemia   . Obesity   . Paroxysmal atrial fibrillation   . Hypothyroidism   . Hypertension   . Hyperlipidemia   . Coronary artery disease     Past Surgical History  Procedure Date  . Hernia repair 1997    left  . Coronary artery bypass graft 05-1990    x7   . Cardiac catheterization 03-1990  . Ett     Family History  Problem Relation Age of Onset  . Obesity Mother 42    deceased  . Hypertension Mother   . Diabetes Mother   . Alcohol abuse Sister 42    deceased  . Other Father     deceased unknown    History   Social History  . Marital Status: Married    Spouse Name: N/A    Number of Children: N/A  . Years of Education: N/A    Occupational History  . retired     2001. Likes to play golf . Travels with motor home   Social History Main Topics  . Smoking status: Former Smoker    Quit date: 02/28/1990  . Smokeless tobacco: Not on file  . Alcohol Use: Yes     minimal  . Drug Use: No  . Sexually Active: Not on file   Other Topics Concern  . Not on file   Social History Narrative  . No narrative on file    ROS: Denies visual or hearing issues.  Denies chest pain, shortness of breath, or syncope.  Denies hemoptysis, melena, or hematemesis.  No joint issues.  No TIA or stroke.  No abdominal pain.  No unusual weight gain or loss.  No heat or cold intolerance.    All other systems reviewed and negative.  PHYSICAL EXAM:  BP 156/94  Pulse 64  Ht 6' (1.829 m)  Wt 238  lb (107.956 kg)  BMI 32.28 kg/m2  General: Well developed, well nourished, in no acute distress. Head:  Normocephalic and atraumatic. HEENT:  Supple neck, no carotid bruits.   Neck: no JVD Lungs: Clear to auscultation and percussion. Heart:  Irregularly irregular rhythm.  Normal S1 and S2.  No definite murmur, gallop, or rub.  Abdomen:  Normal bowel sounds; soft; non tender; no organomegaly Pulses: Pulses normal in all 4 extremities. Extremities: No clubbing or cyanosis. No edema. Skin: no significant rashes.  Neurologic: Alert and oriented x 3.  No focal deficits.  EKG:  Atrial fibrillation with controlled ventricular response.  Incomplete RBBB.  Leftward axis.    ASSESSMENT AND PLAN:

## 2010-06-13 NOTE — Assessment & Plan Note (Addendum)
He was totally unaware of this at the present time.  He carries a CHADS VASC2 score of 3,4 based on vascular disease, hypertension, age, and gender.  His CHADS 2 is probably about 1.8 given his age.  Role of anticoagulation discussed in detail with patient.  Has mild systolic murmur, so will need labs and echocardiogram to assess.  He does need anticoagulation, but will defer until his studies are complete as he has likely been like this for sometime, and his CHADS scores are on the borderline but do favor anticoagulation.  His rate is currently controlled at present. He is on Verapamil, but also on atorvastatin, so will change lipid agent.

## 2010-06-13 NOTE — Assessment & Plan Note (Signed)
He is mildly hypertensive, and we will need to keep and eye on these numbers as we address other issues.

## 2010-06-18 ENCOUNTER — Other Ambulatory Visit (HOSPITAL_COMMUNITY): Payer: Medicare Other | Admitting: Radiology

## 2010-06-21 ENCOUNTER — Encounter: Payer: Self-pay | Admitting: Cardiology

## 2010-06-21 ENCOUNTER — Ambulatory Visit (HOSPITAL_COMMUNITY): Payer: Medicare Other | Attending: Cardiology

## 2010-06-21 ENCOUNTER — Other Ambulatory Visit (HOSPITAL_COMMUNITY): Payer: Medicare Other | Admitting: Radiology

## 2010-06-21 ENCOUNTER — Ambulatory Visit (INDEPENDENT_AMBULATORY_CARE_PROVIDER_SITE_OTHER): Payer: Medicare Other | Admitting: Cardiology

## 2010-06-21 VITALS — BP 138/80 | HR 68 | Resp 18 | Ht 72.0 in | Wt 238.1 lb

## 2010-06-21 DIAGNOSIS — I10 Essential (primary) hypertension: Secondary | ICD-10-CM

## 2010-06-21 DIAGNOSIS — I379 Nonrheumatic pulmonary valve disorder, unspecified: Secondary | ICD-10-CM | POA: Insufficient documentation

## 2010-06-21 DIAGNOSIS — E785 Hyperlipidemia, unspecified: Secondary | ICD-10-CM | POA: Insufficient documentation

## 2010-06-21 DIAGNOSIS — I4891 Unspecified atrial fibrillation: Secondary | ICD-10-CM

## 2010-06-21 DIAGNOSIS — I080 Rheumatic disorders of both mitral and aortic valves: Secondary | ICD-10-CM | POA: Insufficient documentation

## 2010-06-21 DIAGNOSIS — I2510 Atherosclerotic heart disease of native coronary artery without angina pectoris: Secondary | ICD-10-CM

## 2010-06-21 DIAGNOSIS — R011 Cardiac murmur, unspecified: Secondary | ICD-10-CM | POA: Insufficient documentation

## 2010-06-21 DIAGNOSIS — I079 Rheumatic tricuspid valve disease, unspecified: Secondary | ICD-10-CM | POA: Insufficient documentation

## 2010-06-21 MED ORDER — DABIGATRAN ETEXILATE MESYLATE 150 MG PO CAPS: 150.0000 mg | ORAL_CAPSULE | Freq: Two times a day (BID) | ORAL | Status: DC

## 2010-06-21 NOTE — Patient Instructions (Signed)
Your physician has recommended you make the following change in your medication: DECREASE Aspirin to 81mg  once a day, START Pradaxa 150mg  take one by mouth two times a day  Your physician recommends that you return for lab work in: 10 days (CBC 427.31)  Your physician has recommended that you have a sleep study. This test records several body functions during sleep, including: brain activity, eye movement, oxygen and carbon dioxide blood levels, heart rate and rhythm, breathing rate and rhythm, the flow of air through your mouth and nose, snoring, body muscle movements, and chest and belly movement.  Your physician recommends that you schedule a follow-up appointment in: 6-8 WEEKS

## 2010-06-23 NOTE — Progress Notes (Signed)
HPI:  The patient is in for a follow up visit.  He is accompanied by his wife, and we discussed a variety of issues.  Some had to do with the issue of weight and potential for OSA.  His wife thinks he weighs too much, and says he definitely snores.  I reviewed with them the potential of the relationship with af, and they were understanding.    Current Outpatient Prescriptions  Medication Sig Dispense Refill  . aspirin 81 MG tablet Take 1 tablet (81 mg total) by mouth daily.      Marland Kitchen levothyroxine (SYNTHROID, LEVOTHROID) 137 MCG tablet Take 137 mcg by mouth daily.        . pravastatin (PRAVACHOL) 80 MG tablet Take 1 tablet (80 mg total) by mouth daily.  90 tablet  3  . ramipril (ALTACE) 5 MG capsule Take 5 mg by mouth daily.        . sildenafil (VIAGRA) 50 MG tablet Take 50 mg by mouth daily as needed.        . verapamil (COVERA HS) 240 MG (CO) 24 hr tablet Take 240 mg by mouth at bedtime.        . dabigatran (PRADAXA) 150 MG CAPS Take 1 capsule (150 mg total) by mouth every 12 (twelve) hours.  180 capsule  3    No Known Allergies  Past Medical History  Diagnosis Date  . Special screening for malignant neoplasm of prostate   . Ventricular ectopy     mild  . Hyperglycemia   . Obesity   . Paroxysmal atrial fibrillation   . Hypothyroidism   . Hypertension   . Hyperlipidemia   . Coronary artery disease     Past Surgical History  Procedure Date  . Hernia repair 1997    left  . Coronary artery bypass graft 05-1990    x7   . Cardiac catheterization 03-1990  . Ett     Family History  Problem Relation Age of Onset  . Obesity Mother 50    deceased  . Hypertension Mother   . Diabetes Mother   . Alcohol abuse Sister 12    deceased  . Other Father     deceased unknown    History   Social History  . Marital Status: Married    Spouse Name: N/A    Number of Children: N/A  . Years of Education: N/A   Occupational History  . retired     2001. Likes to play golf . Travels with  motor home   Social History Main Topics  . Smoking status: Former Smoker    Quit date: 02/28/1990  . Smokeless tobacco: Not on file  . Alcohol Use: Yes     minimal  . Drug Use: No  . Sexually Active: Not on file   Other Topics Concern  . Not on file   Social History Narrative  . No narrative on file    ROS: Please see the HPI.  All other systems reviewed and negative.  PHYSICAL EXAM:  BP 138/80  Pulse 68  Resp 18  Ht 6' (1.829 m)  Wt 238 lb 1.9 oz (108.011 kg)  BMI 32.30 kg/m2  General: Well developed, well nourished, in no acute distress. Head:  Normocephalic and atraumatic. Neck: no JVD Lungs: Clear to auscultation and percussion. Heart: Normal S1 and S2.  iregularly irregular rhythm.   Abdomen:  Normal bowel sounds; soft; non tender; no organomegaly Pulses: Pulses normal in all 4 extremities. Extremities: No  clubbing or cyanosis. No edema. Neurologic: Alert and oriented x 3.  EKG:  Atrial fib with controlled ventricular response.  ASSESSMENT AND PLAN:

## 2010-06-23 NOTE — Assessment & Plan Note (Signed)
No active symptoms at present.  He will reduce his ASA to 81mg .

## 2010-06-23 NOTE — Assessment & Plan Note (Signed)
He is on prava.  It is a start.  It may not adequately control him, but he is on verap for rate control.

## 2010-06-23 NOTE — Assessment & Plan Note (Signed)
Controlled at present.  

## 2010-06-23 NOTE — Assessment & Plan Note (Addendum)
His rate is controlled.  His CHADS 2 and CHADS Vasc2 scores were reveiwed.  He is nearly 77 years of age which we will count in our calculation.  Anticoagulation is recommended.  I also think he should also have sleep study. We reveiwed dabigatran and its use, potential complications, and issues. He will start on this, and if he has any issues will notify us immediately.  We will recommend fairly early follow up for this, and he is understanding.  His wife is also aware.

## 2010-06-25 ENCOUNTER — Ambulatory Visit (HOSPITAL_BASED_OUTPATIENT_CLINIC_OR_DEPARTMENT_OTHER): Payer: Medicare Other | Attending: Cardiology

## 2010-06-25 DIAGNOSIS — G4733 Obstructive sleep apnea (adult) (pediatric): Secondary | ICD-10-CM | POA: Insufficient documentation

## 2010-06-25 DIAGNOSIS — I4891 Unspecified atrial fibrillation: Secondary | ICD-10-CM | POA: Insufficient documentation

## 2010-06-28 ENCOUNTER — Other Ambulatory Visit: Payer: Self-pay | Admitting: Family Medicine

## 2010-07-01 ENCOUNTER — Other Ambulatory Visit (INDEPENDENT_AMBULATORY_CARE_PROVIDER_SITE_OTHER): Payer: Medicare Other | Admitting: *Deleted

## 2010-07-01 DIAGNOSIS — I4891 Unspecified atrial fibrillation: Secondary | ICD-10-CM

## 2010-07-01 LAB — CBC WITH DIFFERENTIAL/PLATELET
Basophils Relative: 0.7 % (ref 0.0–3.0)
Eosinophils Relative: 2.7 % (ref 0.0–5.0)
Lymphocytes Relative: 26.8 % (ref 12.0–46.0)
MCV: 92.5 fl (ref 78.0–100.0)
Monocytes Relative: 7.6 % (ref 3.0–12.0)
Neutrophils Relative %: 62.2 % (ref 43.0–77.0)
Platelets: 254 10*3/uL (ref 150.0–400.0)
RBC: 4.51 Mil/uL (ref 4.22–5.81)
WBC: 6.2 10*3/uL (ref 4.5–10.5)

## 2010-07-02 DIAGNOSIS — G4733 Obstructive sleep apnea (adult) (pediatric): Secondary | ICD-10-CM

## 2010-07-02 DIAGNOSIS — I4891 Unspecified atrial fibrillation: Secondary | ICD-10-CM

## 2010-07-02 NOTE — Procedures (Addendum)
[name], MERRIOTT NO.:  [id number]  MEDICAL RECORD NO.:  [id number]          PATIENT TYPE:  OUT  LOCATION:  SLEEP CENTER                 FACILITY:  [facility]  PHYSICIAN:  Barbaraann Share, MD,FCCPDATE OF BIRTH:  October 05[birth year] 1935  DATE OF STUDY:  06/25/2010                           NOCTURNAL POLYSOMNOGRAM  REFERRING PHYSICIAN:  THOMAS STUCKEY  INDICATION FOR STUDY:  Hypersomnia with sleep apnea.  EPWORTH SLEEPINESS SCORE:  7.  MEDICATIONS:  SLEEP ARCHITECTURE:  The patient had a total sleep time of 218 minutes with decreased slow wave sleep for age and also only 25 minutes of REM. Sleep onset latency was normal at 20 minutes and REM onset was normal at 90 minutes.  Sleep efficiency was poor at 52%.  RESPIRATORY DATA:  The patient was found to have 7 obstructive apneas and 17 obstructive hypopneas giving him an apnea/hypopnea index of 7 events per hour.  The events occurred more commonly in the supine position and during REM, and there was loud snoring noted throughout. The patient did not meet split night criteria secondary to the small numbers of events.  OXYGEN DATA:  There was O2 desaturation transiently as low as 88% with the patient's obstructive events.  CARDIAC DATA:  The patient was noted to be in atrial fibrillation with a controlled ventricular response, and also noted to have occasional PVCs.  MOVEMENT-PARASOMNIA:  The patient had moderate numbers of periodic leg movements without arousal or awakenings.  There were no abnormal behavior seen.  IMPRESSIONS-RECOMMENDATIONS: 1. Mild obstructive sleep apnea/hypopnea syndrome with an AHI of only     7 events per hour and O2 desaturation transiently as low as 88%.     The patient did not meet split night protocol secondary to the     small numbers of events.  The decision to treat this degree of     sleep apnea should be based upon its impact to the patient's     quality of life, and whether it is felt  to be complicating comorbid     medical conditions.  Clinical correlation is suggested. 2. The patient was noted to be in atrial fibrillation with controlled     ventricular response throughout.  He did have an occasional PVC.     Barbaraann Share, MD,FCCP Diplomate, American Board of Sleep Medicine Electronically Signed    [facility]/MEDQ  D:  07/02/2010 08:06:42  T:  07/02/2010 11:31:34  Job:  [id number]

## 2010-07-16 NOTE — Discharge Summary (Signed)
NAME:  [name], [name]                         ACCOUNT NO.:  [id number]   MEDICAL RECORD NO.:  [id number]                   PATIENT TYPE:  INP   LOCATION:  4731                                 FACILITY:  [facility]   PHYSICIAN:  Thomas C. Wall, M.D. [facility]            DATE OF BIRTH:  10/01/1933   DATE OF ADMISSION:  03/28/2002  DATE OF DISCHARGE:  03/29/2002                                 DISCHARGE SUMMARY   DISCHARGE DIAGNOSES:  1. Paroxysmal atrial fibrillation converting to normal sinus rhythm prior to     discharge.  2. Coronary artery disease.  3. Status post coronary artery bypass graft in 1982 with saphenous vein     graft to diagonal, ramus intermedius; saphenous vein graft to AM,     posterior descending artery, and posterolateral; saphenous vein graft to     obtuse marginal; left anterior descending.  4. Hypertension.  5. Hyperlipidemia.  6. Chronic obstructive pulmonary disease.  7. Hypothyroidism.   HISTORY OF PRESENT ILLNESS:  Please see the dictated admission history and  physical by Jesse Sans. Wall, M.D., from March 28, 2002.  Briefly, this 77-  year-old male with known coronary artery disease was admitted through the  emergency room in atrial fibrillation on March 28, 2002.  He had presented  with a presyncopal event x 1.   HOSPITAL COURSE:  The patient was continued on his verapamil.  He was  monitored overnight.  His cardiac enzymes were negative x 2 for myocardial  infarction.  On the morning of March 29, 2002, he was found to be back in  a normal sinus rhythm.  Therefore, it was felt that he was ready for  discharge to home.  He was sent up for an outpatient Cardiolite in our  office in [place], [place].  He was also instructed to follow up  with his primary care physician to check his thyroid level.  He will be  maintained on the same medications that he was on prior to admission.   LABORATORY DATA:  White blood cell count 7100, hemoglobin 13.9,  hematocrit  39.8, platelet count 203,000.  INR 1.0.  Sodium 138, potassium 4.3, chloride  105, CO2 26, glucose 12, creatinine 1.0, calcium 9.3, total protein 7.7,  albumin 4.0, AST 20, ALT 19, alkaline phosphatase 62, total bilirubin 0.8.  Cardiac enzymes negative x 3.  Total cholesterol 146, triglycerides 191, HDL  41, LDL 67.  TSH 6.128.  Chest x-ray:  Heart size prominent, no active  disease.   DISCHARGE MEDICATIONS:  1. Altace 5 mg a day.  2. Verapamil 240 mg a day.  3. Lipitor 20 mg a day.  4. Levoxyl 125 mcg a day.  5. Aspirin 325 mg every other day.  6. Zetia 10 mg daily.    FOLLOW-UP:  The patient was scheduled for a Cardiolite on March 29, 2002,  at our office in [place], [place].  Tereso Newcomer, P.A.                        Thomas C. Daleen Squibb, M.D. [facility]    SW/MEDQ  D:  05/13/2002  T:  05/13/2002  Job:  [id number]   cc:   Laurita Quint, M.D.  [address] [place]  [place] [address]  Fax: [phone]

## 2010-07-16 NOTE — H&P (Signed)
NAME:  [name], FIORITO                         ACCOUNT NO.:  [id number]   MEDICAL RECORD NO.:  [id number]                   PATIENT TYPE:  INP   LOCATION:  1824                                 FACILITY:  [facility]   PHYSICIAN:  Thomas C. Wall, M.D. [facility]            DATE OF BIRTH:  09-19-1933   DATE OF ADMISSION:  03/28/2002  DATE OF DISCHARGE:                                HISTORY & PHYSICAL   CHIEF COMPLAINT:  I nearly passed out.   HISTORY OF PRESENT ILLNESS:  The patient is a 77 year old male with a  history of coronary disease, status post coronary artery bypass grafting who  had a presyncopal event today after having a loose bowel movement and  abdominal discomfort and nausea.   He was at a [employer] station and the observers called EMS.  They were unable to  obtain a blood pressure radial pulse at first.  He responded to 250 cc of  fluid.   He was brought to the emergency room and was found to be atrial  fibrillation.  He has had a history of postoperative atrial fibrillation in  the past but rarely since then.  He was maintained on Verapamil 240 mg a  day.   Of note, he has been on a strict diet and has lost about 15 pounds recently.  He obviously is not drinking enough fluid but did eat a light breakfast this  morning.  He said he felt he was somewhat hungry before he felt presyncopal.   He is not diabetic.  He has not had any previous seizure or neurological  event.   PAST MEDICAL HISTORY:  Coronary artery bypass grafting in 1992 with vein  graft to diagonal I and ramus intermedius, vein graft to acute marginal, PDA  and posterolateral; and vein graft to OM, LIMA to LAD.  He has good LV  function. He has history of hyperlipidemia and hypertension.  He has COPD,  hypothyroidism, and last stress test was in February of 2003.  He had  previous hernia repair.   MEDICATIONS ON ADMISSION:  1. Altace 5 mg a day.  2. Verapamil 240 a day.  3. Lipitor 20 a day.  4. Levoxyl 125  mcg a day.  5. Aspirin 325 a day.  6. Zetia 10 mg a day.   SOCIAL HISTORY:  The patient lives in [place], [place], with his  wife.  He has greater than 60-pack-year smoking history and quit in 1992.  He has cut out caffeine which use to initiate his atrial fibrillation.  He  retired but works part time at a [employer].   FAMILY HISTORY:  This is noncontributory.   REVIEW OF SYSTEMS:  See Timmie Foerster complete evaluation for details.  Also see the history of present illness.   PHYSICAL EXAMINATION:  GENERAL APPEARANCE:  The patient is in no acute  distress, very pleasant male who is overweight.  VITAL  SIGNS:  Blood pressure 123/81, pulse 90 and irregular.  He is in  atrial fibrillation by telemetry.  Respiratory rate is 20 and unlabored.  Temperature is 97.8, weight is 225.  HEENT:  Unremarkable.  NECK:  No JVD.  Carotid upstroke __________ without bruits.  There is no  thyromegaly.  There is no obvious lymphadenopathy.  LUNGS:  Clear to auscultation and percussion.  CARDIOVASCULAR:  There is an irregular rate and rhythm with no obvious  murmurs, rubs, or gallops.  SKIN:  No rashes or lesions.  ABDOMEN:  Soft with good bowel sounds and no hepatomegaly.  There was no  tenderness.  EXTREMITIES:  No clubbing, cyanosis, or edema.  Pulses were present in both  lower extremities and normal.  NEUROLOGIC:  Grossly intact.   Chest x-ray is pending.   EKG shows atrial fibrillation and no acute changes.   LABORATORY DATA:  This is pending.   ASSESSMENT AND PLAN:  1. Paroxysmal atrial fibrillation.  I suspect that he had a vasovagal event     but it could have been related to atrial fibrillation.  More than likely     with his strict diet and decreased fluid intake, he may have been     somewhat volume depleted as well.  We will admit him for observation and     continue his Verapamil 240 a day which he took this morning.  We may need     to increase the dose.  If he  converts and he rules out for infarct, we     will discharge him home for outpatient Cardiolite in the [place],     [place], office.  2. In the meantime, we will anticoagulate him with heparin.  3. Hypothyroidism.  Check TSH.                                               Thomas C. Daleen Squibb, M.D. [facility]    TCW/MEDQ  D:  03/28/2002  T:  03/28/2002  Job:  [id number]   cc:   Laurita Quint, M.D.  [address] [place]  [place] [address]  Fax: [phone]   Arturo Morton. Riley Kill, M.D. [facility]  [address]  [place]  [place] [address]  Fax: 1

## 2010-08-17 ENCOUNTER — Telehealth: Payer: Self-pay | Admitting: Cardiology

## 2010-08-17 NOTE — Telephone Encounter (Signed)
Pt was put on pradaxa and he had some bleeding last night in his urine and he wants to talk to someone about it because he didn't take a pill today he wants to talk to someone first

## 2010-08-17 NOTE — Telephone Encounter (Signed)
SPOKE WITH PT C/O EPISODE  OF BLOOD IN HIS URINE YESTERDAY  AND NOTED SOME IN UNDERGARMENTS ONLY 1 X FEELS OKAY WORKED TODAY AND HAS TO WORK TOMORROW DISCUSSED WITH DR Graciela Husbands PER DR Graciela Husbands  STOP ASA AND HOLD PRADAXA AND TO F/U WITH PMD PER PT HAS NO UROLOGISTS. ALSO PT INSTRUCTED TO KEEP 08/25/10 APPT WITH DR Riley Kill .[name]

## 2010-08-23 NOTE — Telephone Encounter (Signed)
Pt returning call back to Dr. Riley Kill on yesterday. Wanted to let know him know he O.K will see him on Wednesday.

## 2010-08-25 ENCOUNTER — Ambulatory Visit (INDEPENDENT_AMBULATORY_CARE_PROVIDER_SITE_OTHER): Payer: Medicare Other | Admitting: Cardiology

## 2010-08-25 ENCOUNTER — Encounter: Payer: Self-pay | Admitting: Cardiology

## 2010-08-25 DIAGNOSIS — I4891 Unspecified atrial fibrillation: Secondary | ICD-10-CM

## 2010-08-25 DIAGNOSIS — R319 Hematuria, unspecified: Secondary | ICD-10-CM | POA: Insufficient documentation

## 2010-08-25 DIAGNOSIS — I2510 Atherosclerotic heart disease of native coronary artery without angina pectoris: Secondary | ICD-10-CM

## 2010-08-25 LAB — URINALYSIS, ROUTINE W REFLEX MICROSCOPIC
Ketones, ur: NEGATIVE
Leukocytes, UA: NEGATIVE
Specific Gravity, Urine: 1.02 (ref 1.000–1.030)
Urobilinogen, UA: 0.2 (ref 0.0–1.0)

## 2010-08-25 NOTE — Assessment & Plan Note (Signed)
Suggested that he see a urologist.  However, he will think about it.  Will check with [facility].

## 2010-08-25 NOTE — Telephone Encounter (Signed)
I saw patient in the office, and we discussed in detail.  See encounter.  TS

## 2010-08-25 NOTE — Assessment & Plan Note (Signed)
No current symptoms

## 2010-08-25 NOTE — Assessment & Plan Note (Signed)
Rate controlled.  Does not want to take Pradaxa, and would rather take his chances.

## 2010-08-25 NOTE — Assessment & Plan Note (Signed)
Controlled.  No symptoms at present.  Continue current treatment.

## 2010-08-25 NOTE — Progress Notes (Signed)
HPI:  Patient is in for follow up.  He had an episode of hematuria which resolved.  Does not want to take Pradaxa, and wants to limit ASA.  Also had sleep apnea, mild by criteria.  He and I discussed in detail today.  No chest pain.  We discussed need for urology visit, and he discussed wanting to "think about it".    Current Outpatient Prescriptions  Medication Sig Dispense Refill  . aspirin 81 MG tablet Take 1 tablet (81 mg total) by mouth daily.      Marland Kitchen LEVOXYL 137 MCG tablet TAKE 1 TABLET EVERY DAY BY MOUTH  90 tablet  1  . pravastatin (PRAVACHOL) 80 MG tablet Take 1 tablet (80 mg total) by mouth daily.  90 tablet  3  . ramipril (ALTACE) 5 MG capsule Take 5 mg by mouth daily.        . sildenafil (VIAGRA) 50 MG tablet Take 50 mg by mouth daily as needed.        . verapamil (COVERA HS) 240 MG (CO) 24 hr tablet Take 240 mg by mouth at bedtime.        Marland Kitchen DISCONTD: dabigatran (PRADAXA) 150 MG CAPS Take 1 capsule (150 mg total) by mouth every 12 (twelve) hours.  180 capsule  3    No Known Allergies  Past Medical History  Diagnosis Date  . Special screening for malignant neoplasm of prostate   . Ventricular ectopy     mild  . Hyperglycemia   . Obesity   . Paroxysmal atrial fibrillation   . Hypothyroidism   . Hypertension   . Hyperlipidemia   . Coronary artery disease     Past Surgical History  Procedure Date  . Hernia repair 1997    left  . Coronary artery bypass graft 05-1990    x7   . Cardiac catheterization 03-1990  . Ett     Family History  Problem Relation Age of Onset  . Obesity Mother 29    deceased  . Hypertension Mother   . Diabetes Mother   . Alcohol abuse Sister 66    deceased  . Other Father     deceased unknown    History   Social History  . Marital Status: Married    Spouse Name: N/A    Number of Children: N/A  . Years of Education: N/A   Occupational History  . retired     2001. Likes to play golf . Travels with motor home   Social History Main  Topics  . Smoking status: Former Smoker    Quit date: 02/28/1990  . Smokeless tobacco: Not on file  . Alcohol Use: Yes     minimal  . Drug Use: No  . Sexually Active: Not on file   Other Topics Concern  . Not on file   Social History Narrative  . No narrative on file    ROS: Please see the HPI.  All other systems reviewed and negative.  PHYSICAL EXAM:  BP 110/68  Pulse 60  Ht 6' (1.829 m)  Wt 240 lb (108.863 kg)  BMI 32.55 kg/m2  General: Well developed, well nourished, in no acute distress. Head:  Normocephalic and atraumatic. Neck: no JVD Lungs: Clear to auscultation and percussion. Heart: Normal S1 and S2.  No murmur, rubs or gallops. Irregularly irregular rhythm. Pulses: Pulses normal in all 4 extremities. Extremities: No clubbing or cyanosis. No edema. Neurologic: Alert and oriented x 3.  EKG:  ASSESSMENT AND PLAN:

## 2010-08-25 NOTE — Patient Instructions (Signed)
Your physician recommends that you have lab work today: U/A  Your physician recommends that you schedule a follow-up appointment in: 6-8 weeks.  Your physician has recommended you make the following change in your medication:  1) Stop Pradaxa. 2) Continue aspirin at one tablet every day.

## 2010-09-02 ENCOUNTER — Telehealth: Payer: Self-pay | Admitting: Cardiology

## 2010-09-02 NOTE — Telephone Encounter (Signed)
Patient is aware of urinalysis results. He currently does not wish to see a urologist. He has a physical with his primary care in September and would rather just repeat the urinalysis then. He was taking Pradaxa but has currently stopped.

## 2010-09-02 NOTE — Telephone Encounter (Signed)
Returning call back to whitney.

## 2010-10-11 ENCOUNTER — Encounter: Payer: Self-pay | Admitting: Cardiology

## 2010-10-11 ENCOUNTER — Ambulatory Visit (INDEPENDENT_AMBULATORY_CARE_PROVIDER_SITE_OTHER): Payer: Medicare Other | Admitting: Cardiology

## 2010-10-11 VITALS — BP 142/98 | HR 62 | Ht 76.0 in | Wt 239.0 lb

## 2010-10-11 DIAGNOSIS — I4891 Unspecified atrial fibrillation: Secondary | ICD-10-CM

## 2010-10-11 DIAGNOSIS — E785 Hyperlipidemia, unspecified: Secondary | ICD-10-CM

## 2010-10-11 DIAGNOSIS — I2510 Atherosclerotic heart disease of native coronary artery without angina pectoris: Secondary | ICD-10-CM

## 2010-10-11 DIAGNOSIS — I10 Essential (primary) hypertension: Secondary | ICD-10-CM

## 2010-10-11 NOTE — Progress Notes (Signed)
HPI:  Is scheduled to see Dr. Para March for physical in the near future.  He does not want to take Pradaxa---had some trouble, and also saw TV adds regardling lawsuits.  He has taken verapamil for more that twenty years.  His BP is generally pretty good overall.  He is working at [employer] and he understands that exercise will not hurt him in general.  Can walk up to 2-2.5 miles, and basically no difficulty.   Current Outpatient Prescriptions  Medication Sig Dispense Refill  . aspirin EC 325 MG tablet One tablet by mouth every other day      . LEVOXYL 137 MCG tablet TAKE 1 TABLET EVERY DAY BY MOUTH  90 tablet  1  . pravastatin (PRAVACHOL) 80 MG tablet Take 1 tablet (80 mg total) by mouth daily.  90 tablet  3  . ramipril (ALTACE) 5 MG capsule Take 5 mg by mouth daily.        . sildenafil (VIAGRA) 50 MG tablet Take 50 mg by mouth daily as needed.        . verapamil (COVERA HS) 240 MG (CO) 24 hr tablet Take 240 mg by mouth at bedtime.          No Known Allergies  Past Medical History  Diagnosis Date  . Special screening for malignant neoplasm of prostate   . Ventricular ectopy     mild  . Hyperglycemia   . Obesity   . Paroxysmal atrial fibrillation   . Hypothyroidism   . Hypertension   . Hyperlipidemia   . Coronary artery disease     Past Surgical History  Procedure Date  . Hernia repair 1997    left  . Coronary artery bypass graft 05-1990    x7   . Cardiac catheterization 03-1990  . Ett     Family History  Problem Relation Age of Onset  . Obesity Mother 70    deceased  . Hypertension Mother   . Diabetes Mother   . Alcohol abuse Sister 81    deceased  . Other Father     deceased unknown    History   Social History  . Marital Status: Married    Spouse Name: N/A    Number of Children: N/A  . Years of Education: N/A   Occupational History  . retired     2001. Likes to play golf . Travels with motor home   Social History Main Topics  . Smoking status: Former  Smoker    Quit date: 02/28/1990  . Smokeless tobacco: Not on file  . Alcohol Use: Yes     minimal  . Drug Use: No  . Sexually Active: Not on file   Other Topics Concern  . Not on file   Social History Narrative  . No narrative on file    ROS: Please see the HPI.  All other systems reviewed and negative.  PHYSICAL EXAM:  BP 142/98  Pulse 62  Ht 6\' 4"  (1.93 m)  Wt 239 lb (108.41 kg)  BMI 29.09 kg/m2  General: Well developed, well nourished, in no acute distress. Head:  Normocephalic and atraumatic. Neck: no JVD Lungs: Clear to auscultation and percussion. Heart: Normal S1 and S2.  No murmur, rubs or gallops.  Abdomen:  Normal bowel sounds; soft; non tender; no organomegaly Pulses: Pulses normal in all 4 extremities. Extremities: No clubbing or cyanosis. No edema. Neurologic: Alert and oriented x 3.  EKG:  Afib with controlled V response.  Rate controlled.  Nonspecific T abnormality. ASSESSMENT AND PLAN:

## 2010-10-11 NOTE — Patient Instructions (Signed)
Your physician recommends that you schedule a follow-up appointment in: 3 MONTHS WITH DR STUCKEY Your physician recommends that you continue on your current medications as directed. Please refer to the Current Medication list given to you today. 

## 2010-10-14 NOTE — Assessment & Plan Note (Signed)
Rechecked today.  BP was 126/90.  Has taken the Verap for years.  Might be worth adding, or considering an alternative.  Weight loss would be helpful particularly in light of potential of OSA.

## 2010-10-14 NOTE — Assessment & Plan Note (Signed)
His rate is controlled.  Has CHADS 2 of 2 bordering on 3.  Have discussed anticoagulation. Prefers ASA for now.

## 2010-10-14 NOTE — Assessment & Plan Note (Signed)
Currently LDL is in the 90s.  Will defer to Dr. Para March.  In general, goal would be 70.  With verapamil, cannot use simvastatin as alternative, and need to watch Lipitor as well.

## 2010-10-14 NOTE — Assessment & Plan Note (Signed)
No symptoms at present.   Denies any chest pain.

## 2010-10-29 ENCOUNTER — Other Ambulatory Visit: Payer: Medicare Other

## 2010-11-01 ENCOUNTER — Other Ambulatory Visit: Payer: Self-pay | Admitting: Family Medicine

## 2010-11-01 DIAGNOSIS — E039 Hypothyroidism, unspecified: Secondary | ICD-10-CM

## 2010-11-01 DIAGNOSIS — E7800 Pure hypercholesterolemia, unspecified: Secondary | ICD-10-CM

## 2010-11-01 DIAGNOSIS — E789 Disorder of lipoprotein metabolism, unspecified: Secondary | ICD-10-CM

## 2010-11-04 ENCOUNTER — Encounter: Payer: Medicare Other | Admitting: Family Medicine

## 2010-11-09 ENCOUNTER — Encounter: Payer: Medicare Other | Admitting: Family Medicine

## 2010-11-16 ENCOUNTER — Other Ambulatory Visit: Payer: Medicare Other

## 2010-11-23 ENCOUNTER — Encounter: Payer: Medicare Other | Admitting: Family Medicine

## 2010-12-03 ENCOUNTER — Encounter: Payer: Self-pay | Admitting: Gastroenterology

## 2010-12-21 ENCOUNTER — Other Ambulatory Visit (INDEPENDENT_AMBULATORY_CARE_PROVIDER_SITE_OTHER): Payer: Medicare Other

## 2010-12-21 DIAGNOSIS — E039 Hypothyroidism, unspecified: Secondary | ICD-10-CM

## 2010-12-21 DIAGNOSIS — E7800 Pure hypercholesterolemia, unspecified: Secondary | ICD-10-CM

## 2010-12-21 LAB — LDL CHOLESTEROL, DIRECT: Direct LDL: 125.5 mg/dL

## 2010-12-21 LAB — COMPREHENSIVE METABOLIC PANEL WITH GFR
ALT: 13 U/L (ref 0–53)
AST: 17 U/L (ref 0–37)
Albumin: 4.1 g/dL (ref 3.5–5.2)
Alkaline Phosphatase: 56 U/L (ref 39–117)
BUN: 14 mg/dL (ref 6–23)
CO2: 27 meq/L (ref 19–32)
Calcium: 9 mg/dL (ref 8.4–10.5)
Chloride: 106 meq/L (ref 96–112)
Creatinine, Ser: 1 mg/dL (ref 0.4–1.5)
GFR: 81.19 mL/min
Glucose, Bld: 87 mg/dL (ref 70–99)
Potassium: 4.1 meq/L (ref 3.5–5.1)
Sodium: 141 meq/L (ref 135–145)
Total Bilirubin: 0.8 mg/dL (ref 0.3–1.2)
Total Protein: 7.4 g/dL (ref 6.0–8.3)

## 2010-12-21 LAB — TSH: TSH: 0.53 u[IU]/mL (ref 0.35–5.50)

## 2010-12-21 LAB — LIPID PANEL: VLDL: 46 mg/dL — ABNORMAL HIGH (ref 0.0–40.0)

## 2010-12-24 ENCOUNTER — Encounter: Payer: Self-pay | Admitting: Family Medicine

## 2010-12-27 ENCOUNTER — Encounter: Payer: Medicare Other | Admitting: Family Medicine

## 2010-12-27 ENCOUNTER — Encounter: Payer: Self-pay | Admitting: Family Medicine

## 2010-12-28 ENCOUNTER — Encounter: Payer: Self-pay | Admitting: Family Medicine

## 2010-12-28 ENCOUNTER — Ambulatory Visit (INDEPENDENT_AMBULATORY_CARE_PROVIDER_SITE_OTHER): Payer: Medicare Other | Admitting: Family Medicine

## 2010-12-28 VITALS — BP 130/76 | HR 63 | Temp 97.7°F | Wt 243.1 lb

## 2010-12-28 DIAGNOSIS — N529 Male erectile dysfunction, unspecified: Secondary | ICD-10-CM

## 2010-12-28 DIAGNOSIS — I10 Essential (primary) hypertension: Secondary | ICD-10-CM

## 2010-12-28 DIAGNOSIS — E039 Hypothyroidism, unspecified: Secondary | ICD-10-CM

## 2010-12-28 DIAGNOSIS — Z1211 Encounter for screening for malignant neoplasm of colon: Secondary | ICD-10-CM

## 2010-12-28 DIAGNOSIS — R7989 Other specified abnormal findings of blood chemistry: Secondary | ICD-10-CM

## 2010-12-28 DIAGNOSIS — Z23 Encounter for immunization: Secondary | ICD-10-CM

## 2010-12-28 DIAGNOSIS — R319 Hematuria, unspecified: Secondary | ICD-10-CM

## 2010-12-28 DIAGNOSIS — E785 Hyperlipidemia, unspecified: Secondary | ICD-10-CM

## 2010-12-28 DIAGNOSIS — I4891 Unspecified atrial fibrillation: Secondary | ICD-10-CM

## 2010-12-28 LAB — POCT URINALYSIS DIPSTICK
Nitrite, UA: NEGATIVE
Protein, UA: NEGATIVE
Spec Grav, UA: 1.01
Urobilinogen, UA: NEGATIVE

## 2010-12-28 MED ORDER — SILDENAFIL CITRATE 50 MG PO TABS: 50.0000 mg | ORAL_TABLET | Freq: Every day | ORAL | Status: DC | PRN

## 2010-12-28 MED ORDER — LEVOXYL 137 MCG PO TABS: 137.0000 ug | ORAL_TABLET | Freq: Every day | ORAL | Status: DC

## 2010-12-28 MED ORDER — VERAPAMIL HCL 240 MG (CO) PO TB24: 240.0000 mg | ORAL_TABLET | Freq: Every day | ORAL | Status: DC

## 2010-12-28 MED ORDER — PRAVASTATIN SODIUM 80 MG PO TABS: 80.0000 mg | ORAL_TABLET | Freq: Every day | ORAL | Status: DC

## 2010-12-28 MED ORDER — RAMIPRIL 5 MG PO CAPS: 5.0000 mg | ORAL_CAPSULE | Freq: Every day | ORAL | Status: DC

## 2010-12-28 NOTE — Patient Instructions (Addendum)
See Shirlee Limerick about your referral before your leave today. Don't change your meds for now.  Keep working on your diet and exercise more.  Let me know if you have concerns in the meantime. Recheck labs next year at a physical.  Please call about the colonoscopy.  Glad to see you.

## 2010-12-28 NOTE — Progress Notes (Signed)
Hypertension:    Using medication without problems or lightheadedness: yes Chest pain with exertion:no Edema:no Short of breath:no  Elevated Cholesterol: Using medications without problems:yes Muscle aches: no Diet compliance: "I lost some weight and then it got away from me."   Exercise:yes  Thyroid. No ant neck pain.  No ADE from medicine. Compliant.  No neck mass.    Had AF but no recent palpitations. Had blood in urine on pradaxa, none noted by pt since stopping the medicine.   Meds, vitals, and allergies reviewed.   PMH and SH reviewed  ROS: See HPI.  Otherwise negative.    GEN: nad, alert and oriented HEENT: mucous membranes moist NECK: supple w/o LA CV: rrr. PULM: ctab, no inc wob ABD: soft, +bs EXT: no edema SKIN: no acute rash Prostate gland firm and smooth, no enlargement, nodularity, tenderness, mass, asymmetry or induration.

## 2010-12-29 DIAGNOSIS — Z1211 Encounter for screening for malignant neoplasm of colon: Secondary | ICD-10-CM | POA: Insufficient documentation

## 2010-12-29 NOTE — Assessment & Plan Note (Signed)
No change in meds, labs d/w pt along with diet/exercise.

## 2010-12-29 NOTE — Assessment & Plan Note (Signed)
No change in meds, labs d/w pt along with diet/exercise.   

## 2010-12-29 NOTE — Assessment & Plan Note (Signed)
tsh wnl, no change in meds.  

## 2010-12-29 NOTE — Assessment & Plan Note (Signed)
Improved on recent lab, continue work on diet.

## 2010-12-29 NOTE — Assessment & Plan Note (Signed)
Off pradax but with blood on dip today.  Prostate w/o mass and no pain on urination.  I would like uro eval as pt is a prev smoker and still on ASA.

## 2010-12-29 NOTE — Assessment & Plan Note (Signed)
Continue current meds 

## 2010-12-29 NOTE — Assessment & Plan Note (Signed)
tsh wnl, no palpitations, see below about blood in urine and pradaxa.

## 2010-12-29 NOTE — Assessment & Plan Note (Signed)
Pt to call about f/u for this- he just got a note from GI about scheduling his visit.

## 2011-01-18 ENCOUNTER — Encounter: Payer: Self-pay | Admitting: Cardiology

## 2011-01-18 ENCOUNTER — Ambulatory Visit (INDEPENDENT_AMBULATORY_CARE_PROVIDER_SITE_OTHER): Payer: Medicare Other | Admitting: Cardiology

## 2011-01-18 DIAGNOSIS — I4891 Unspecified atrial fibrillation: Secondary | ICD-10-CM

## 2011-01-18 DIAGNOSIS — E785 Hyperlipidemia, unspecified: Secondary | ICD-10-CM

## 2011-01-18 DIAGNOSIS — E669 Obesity, unspecified: Secondary | ICD-10-CM

## 2011-01-18 NOTE — Assessment & Plan Note (Signed)
Scheduled to see Urology.  Agree with this.  Will remain on ASA at present time.

## 2011-01-18 NOTE — Progress Notes (Signed)
Patient ID: [name], male   DOB: 03-Oct-1933, 77 y.o.   MRN: [id number]

## 2011-01-18 NOTE — Assessment & Plan Note (Signed)
Has mild OSA.  Weight reduction benefits discussed.

## 2011-01-18 NOTE — Assessment & Plan Note (Signed)
Remains in atrial fib.  Does not want to take pradaxa.  Currently on ASA at 325 mg.  Could use DAPT with lower dose ASA as alternative vis chest guidelines, but awaiting urologic discussion to see what he has before considering any of these.  Will see back in three months.

## 2011-01-18 NOTE — Progress Notes (Signed)
HPI:  He is doing pretty well.  He has been on ASA for many years and does not want to change.  He does have some hematuria, which is microscopic, and is scheduled to see a urologist.  We reviewed his sleep and echo studies, discussed mechanisms, and treatment options.    Current Outpatient Prescriptions  Medication Sig Dispense Refill  . aspirin EC 325 MG tablet One tablet by mouth every other day      . LEVOXYL 137 MCG tablet Take 1 tablet (137 mcg total) by mouth daily.  90 tablet  3  . pravastatin (PRAVACHOL) 80 MG tablet Take 1 tablet (80 mg total) by mouth daily.  90 tablet  3  . ramipril (ALTACE) 5 MG capsule Take 1 capsule (5 mg total) by mouth daily.  90 capsule  3  . sildenafil (VIAGRA) 50 MG tablet Take 1 tablet (50 mg total) by mouth daily as needed.  10 tablet  12  . verapamil (COVERA HS) 240 MG (CO) 24 hr tablet Take 1 tablet (240 mg total) by mouth at bedtime.  90 tablet  3    Allergies  Allergen Reactions  . Pradaxa     Blood in urine    Past Medical History  Diagnosis Date  . Special screening for malignant neoplasm of prostate   . Ventricular ectopy     mild  . Hyperglycemia   . Obesity   . Paroxysmal atrial fibrillation   . Hypothyroidism   . Hypertension   . Hyperlipidemia   . Coronary artery disease   . Organic impotence     Past Surgical History  Procedure Date  . Hernia repair 1997    left  . Coronary artery bypass graft 05-1990    x7   . Cardiac catheterization 03-1990    Positive  . Ett 10/31/00    WNL  . Hospital - afib 03/28/02  . Hospital 1988  . Cardiolite 2003    Spect Wall Motion - Neg.  Inferobas defect HTNSVE    Family History  Problem Relation Age of Onset  . Obesity Mother 35    deceased  . Hypertension Mother   . Diabetes Mother   . Alcohol abuse Sister 4    deceased  . Other Father     deceased unknown  . Colon cancer Neg Hx   . Prostate cancer Neg Hx     History   Social History  . Marital Status: Married   Spouse Name: N/A    Number of Children: N/A  . Years of Education: N/A   Occupational History  . retired - Energy manager.     2001. Likes to play golf . Travels with motor home   Social History Main Topics  . Smoking status: Former Smoker    Types: Cigarettes    Quit date: 02/28/1990  . Smokeless tobacco: Not on file  . Alcohol Use: Yes     minimal  . Drug Use: No  . Sexually Active: Not on file   Other Topics Concern  . Not on file   Social History Narrative   From [place].Married, 2nd wife had lung CA 2009Divorced, remarried, lives with wife, 3 children (1st marriage)Likes to play golf, travels with motor home.Prev 4 sport athlete in HS, football fan.     ROS: Please see the HPI.  All other systems reviewed and negative.  PHYSICAL EXAM:  BP 138/62  Pulse 70  Resp 18  Ht 6' (1.829 m)  Wt [phone]  kg (240 lb)  BMI 32.55 kg/m2  General: Well developed, well nourished, in no acute distress. Head:  Normocephalic and atraumatic. Neck: no JVD Lungs: Clear to auscultation and percussion. Heart: Normal S1 and S2.  No murmur, rubs or gallops.  Abdomen:  Normal bowel sounds; soft; non tender; no organomegaly Pulses: Pulses normal in all 4 extremities. Extremities: No clubbing or cyanosis. No edema. Neurologic: Alert and oriented x 3.  EKG:  Atrial fib with controlled ventricular response.  ECHO:  05/2010  Reviewed with patient    - Left ventricle: The cavity size was normal. Wall thickness was increased in a pattern of moderate LVH. Systolic function was normal. The estimated ejection fraction was in the range of 55% to 65%. Wall motion was normal; there were no regional wall motion abnormalities. - Aortic valve: Mild regurgitation. - Mitral valve: Mild regurgitation. - Left atrium: The atrium was moderately dilated. - Right atrium: The atrium was mildly dilated. - Atrial septum: No defect or patent foramen ovale was identified. - Tricuspid valve:  Moderate-severe regurgitation. - Pulmonary arteries: PA peak pressure: 45mm Hg (S).     ASSESSMENT AND PLAN:

## 2011-01-18 NOTE — Patient Instructions (Signed)
Your physician recommends that you schedule a follow-up appointment in: 3-4 months. 

## 2011-01-18 NOTE — Assessment & Plan Note (Signed)
Dr. Para March has seen and plans to review with patient at time of office visit.  Options would include increase in statin dose.

## 2011-01-28 ENCOUNTER — Telehealth: Payer: Self-pay | Admitting: Cardiology

## 2011-01-28 ENCOUNTER — Encounter: Payer: Self-pay | Admitting: Vascular Surgery

## 2011-01-28 ENCOUNTER — Ambulatory Visit (INDEPENDENT_AMBULATORY_CARE_PROVIDER_SITE_OTHER): Payer: Medicare Other | Admitting: Vascular Surgery

## 2011-01-28 ENCOUNTER — Other Ambulatory Visit (INDEPENDENT_AMBULATORY_CARE_PROVIDER_SITE_OTHER): Payer: Medicare Other | Admitting: *Deleted

## 2011-01-28 VITALS — BP 183/119 | HR 84 | Resp 14 | Ht 72.0 in | Wt 242.0 lb

## 2011-01-28 DIAGNOSIS — I4891 Unspecified atrial fibrillation: Secondary | ICD-10-CM

## 2011-01-28 DIAGNOSIS — I714 Abdominal aortic aneurysm, without rupture: Secondary | ICD-10-CM

## 2011-01-28 DIAGNOSIS — I2510 Atherosclerotic heart disease of native coronary artery without angina pectoris: Secondary | ICD-10-CM

## 2011-01-28 NOTE — Telephone Encounter (Signed)
Dr Riley Kill needs to sign 01/18/11 office note.  Dr Riley Kill did speak with Dr Vernie Ammons about this patient and he needs to document if the pt is cleared.

## 2011-01-28 NOTE — Telephone Encounter (Signed)
New problem:  From office visit on 11/20 can patient be clear for surgery.

## 2011-01-28 NOTE — Progress Notes (Signed)
[facility]  Referred by: Garnett Farm, MD [address] [place], [place] [address]  Reason for referral: AAA  History of Present Illness  The patient is a 77 y.o. male who presents with chief complaint: AAA.  Pt underwent CT abd/pelvis for possible kidney stone, given recent hematuria.  A large AAA was found during that study.  The patient does not have back or abdominal pain.  The patient does not history of embolic episodes from the AAA.  The patient's risk factors for AAA included: male, age, and prior smoking history.  The patient not currently smoke cigarettes.  Past Medical History  Diagnosis Date  . Special screening for malignant neoplasm of prostate   . Ventricular ectopy     mild  . Hyperglycemia   . Obesity   . Paroxysmal atrial fibrillation   . Hypothyroidism   . Hypertension   . Hyperlipidemia   . Coronary artery disease   . Organic impotence     Past Surgical History  Procedure Date  . Hernia repair 1997    left  . Coronary artery bypass graft 05-1990    x7   . Cardiac catheterization 03-1990    Positive  . Ett 10/31/00    WNL  . Hospital - afib 03/28/02  . Hospital 1988  . Cardiolite 2003    Spect Wall Motion - Neg.  Inferobas defect HTNSVE    History   Social History  . Marital Status: Married    Spouse Name: N/A    Number of Children: N/A  . Years of Education: N/A   Occupational History  . retired - Energy manager.     2001. Likes to play golf . Travels with motor home   Social History Main Topics  . Smoking status: Former Smoker    Types: Cigarettes    Quit date: 02/28/1990  . Smokeless tobacco: Not on file  . Alcohol Use: Yes     minimal  . Drug Use: No  . Sexually Active: Not on file   Other Topics Concern  . Not on file   Social History Narrative   From [place].Married, 2nd wife had lung CA 2009Divorced, remarried, lives with wife, 3 children (1st marriage)Likes to play golf, travels  with motor home.Prev 4 sport athlete in HS, football fan.     Family History  Problem Relation Age of Onset  . Obesity Mother 76    deceased  . Hypertension Mother   . Diabetes Mother   . Alcohol abuse Sister 66    deceased  . Other Father     deceased unknown  . Colon cancer Neg Hx   . Prostate cancer Neg Hx     Current Outpatient Prescriptions on File Prior to Visit  Medication Sig Dispense Refill  . aspirin EC 325 MG tablet One tablet by mouth every other day      . LEVOXYL 137 MCG tablet Take 1 tablet (137 mcg total) by mouth daily.  90 tablet  3  . pravastatin (PRAVACHOL) 80 MG tablet Take 1 tablet (80 mg total) by mouth daily.  90 tablet  3  . ramipril (ALTACE) 5 MG capsule Take 1 capsule (5 mg total) by mouth daily.  90 capsule  3  . sildenafil (VIAGRA) 50 MG tablet Take 1 tablet (50 mg total) by mouth daily as needed.  10 tablet  12  . verapamil (COVERA HS) 240 MG (CO) 24 hr tablet Take 1 tablet (240 mg  total) by mouth at bedtime.  90 tablet  3    Allergies  Allergen Reactions  . Pradaxa     Blood in urine    REVIEW OF SYSTEMS:  (Positives checked otherwise negative)  CARDIOVASCULAR: [ ]  chest pain    [ ]  chest pressure    [ ]  palpitations    [ ]  orthopnea   [ ]  dyspnea on exert. [ ]  claudication    [ ]  rest pain     [ ]  DVT     [ ]  phlebitis  PULMONARY:    [ ]  productive cough [ ]  asthma  [ ]  wheezing  NEUROLOGIC:    [ ]  weakness    [ ]  paresthesias   [ ]  aphasia    [ ]  amaurosis    [ ]  dizziness  HEMATOLOGIC:    [ ]  bleeding problems  [ ]  clotting disorders  MUSCULOSKEL: [ ]  joint pain     [ ]  joint swelling  GASTROINTEST:  [ ]   blood in stool   [ ]   hematemesis  GENITOURINARY:   [ ]   dysuria    [x]   hematuria  PSYCHIATRIC:   [ ]  history of major depression  INTEGUMENTARY: [ ]  rashes    [ ]  ulcers  CONSTITUTIONAL:  [ ]  fever     [ ]  chills  Physical Examination  Filed Vitals:   01/28/11 0934  BP: 183/119  Pulse: 84  Resp: 14  Height: 6' (1.829  m)  Weight: 242 lb (109.77 kg)  SpO2: 99%   Body mass index is 32.82 kg/(m^2).  General: A&O x 3, WDWN, obese  Head: [place]/AT  Ear/Nose/Throat: Hearing grossly intact, nares w/o erythema or drainage, oropharynx w/o Erythema/Exudate  Eyes: PERRLA, EOMI  Neck: Supple, no nuchal rigidity, no palpable LAD  Pulmonary: Sym exp, good air movt, CTAB, no rales, rhonchi, & wheezing  Cardiac: RRR, Nl S1, S2, no Murmurs, rubs or gallops  Vascular: Vessel Right Left  Radial Palpable Palpable  Brachial Palpable Palpable  Carotid Palpable, without bruit Palpable, with bruit  Aorta Palpable N/A  Femoral Palpable Palpable  Popliteal Non-palpable Non-palpable  PT Non-Palpable Non-Palpable  DP Non-Palpable Non-Palpable   Gastrointestinal: soft, NTND, -G/R, - HSM, - masses, - CVAT B, obese, palpable pulsatile mass  Musculoskeletal: M/S 5/5 throughout , Extremities without ischemic changes   Neurologic: CN 2-12 intact , Pain and light touch intact in extremities , Motor exam as listed above  Psychiatric: Judgment intact, Mood & affect appropriate for pt's clinical situation  Dermatologic: See M/S exam for extremity exam, no rashes otherwise noted  Lymph : No Cervical, Axillary, or Inguinal lymphadenopathy   Non-Invasive Vascular Imaging  BLE ABI (Date: 01/28/11)  RLE: 1.21, PT and AT: biphasic  LLE: 1.10, PT and AT: biphasic  CT Abd/Pelvis w/ and w/o contrast (01/28/11) 1. Bilateral calcifications within kidneys. Especially on the right, favored to be vascular. A punctate left renal collecting system stone cannot be excluded. 2. Soft tissue density within the dependent urinary bladder. Concurrent prostatomegaly. This could represent prominent median lobe of the prostate. An adjacent bladder mass cannot be excluded.  Consider correlation with cystoscopy. 3. No other explanation for hematuria. 4. 7.1 cm abdominal aortic aneurysm with mild extension into the left common iliac  artery.  Based my review of the CT, the patient has a large AAA which appears to have adequate neck for a Gore C3.  There is some atherosclerosis in the iliac arterial systems, but they appear  large enough to accommodate an endograft.  There is some tortuousity but I think it is likely the main body could pass through the left iliac arterial system.  Outside Studies/Documentation 10 pages of outside documents were reviewed including: outside CT report and clinic workup to date  Medical Decision Making  The patient is a 77 y.o. male who presents with: asx large AAA.   Based on this patient's exam and diagnostic studies, he needs open AAA repair vs. EVAR.  The patient also needs preoperative cardiac clearance by his cardiologist, Dr. Riley Kill, which recently saw this patient.  I will have more Gore rep. review the CT on Monday to see his device is compatible with his patient's anatomy.  Regardless, the plan is to repair this patient within the next two weeks.  I emphasized the importance of maximal medical management including strict control of blood pressure, blood glucose, and lipid levels, obtaining regular exercise, and cessation of smoking.    He will follow up with me this coming Friday to finalize the plan and complete preoperative counseling.  Thank you for allowing [place] to participate in this patient's care.  Leonides Sake, MD [facility] Office: [phone] Pager: [phone]  01/28/2011, 7:08 PM

## 2011-01-30 NOTE — Telephone Encounter (Signed)
He will need a pharmacologic stress evaluation prior to surgery, and we will need to pull his old chart and see him this week as an add on.  He has had treatment remotely for his CAD, with CABG in 1992.

## 2011-01-31 NOTE — Telephone Encounter (Signed)
I spoke with the pt and made him aware of appointments.  I did review the instructions for myoview.

## 2011-01-31 NOTE — Telephone Encounter (Signed)
F/U from previous call:  Status of cardiac clearance.

## 2011-01-31 NOTE — Telephone Encounter (Signed)
I called to speak with the pt but he is still playing golf.  I will try to reach the pt again later today.  I did make Kendal Hymen at [facility] aware of the plan.

## 2011-01-31 NOTE — Telephone Encounter (Addendum)
I spoke with the pt's wife and made her aware that Dr Riley Kill would like the pt to have a Lexiscan Myoview and OV this week to discuss cardiac clearance. I will attempt to get these appointments arranged and call the pt back after 3:30. Myoview scheduled 02/01/11 at 7:30.

## 2011-02-01 ENCOUNTER — Encounter: Payer: Self-pay | Admitting: Family Medicine

## 2011-02-01 ENCOUNTER — Ambulatory Visit (INDEPENDENT_AMBULATORY_CARE_PROVIDER_SITE_OTHER): Payer: Medicare Other | Admitting: Cardiology

## 2011-02-01 ENCOUNTER — Ambulatory Visit (HOSPITAL_COMMUNITY): Payer: Medicare Other | Attending: Cardiology | Admitting: Radiology

## 2011-02-01 ENCOUNTER — Encounter: Payer: Self-pay | Admitting: Cardiology

## 2011-02-01 DIAGNOSIS — Z01810 Encounter for preprocedural cardiovascular examination: Secondary | ICD-10-CM | POA: Insufficient documentation

## 2011-02-01 DIAGNOSIS — I714 Abdominal aortic aneurysm, without rupture: Secondary | ICD-10-CM

## 2011-02-01 DIAGNOSIS — Z951 Presence of aortocoronary bypass graft: Secondary | ICD-10-CM | POA: Insufficient documentation

## 2011-02-01 DIAGNOSIS — I2510 Atherosclerotic heart disease of native coronary artery without angina pectoris: Secondary | ICD-10-CM | POA: Insufficient documentation

## 2011-02-01 DIAGNOSIS — I10 Essential (primary) hypertension: Secondary | ICD-10-CM | POA: Insufficient documentation

## 2011-02-01 DIAGNOSIS — I4891 Unspecified atrial fibrillation: Secondary | ICD-10-CM

## 2011-02-01 DIAGNOSIS — R0602 Shortness of breath: Secondary | ICD-10-CM | POA: Insufficient documentation

## 2011-02-01 DIAGNOSIS — Z87891 Personal history of nicotine dependence: Secondary | ICD-10-CM | POA: Insufficient documentation

## 2011-02-01 DIAGNOSIS — E785 Hyperlipidemia, unspecified: Secondary | ICD-10-CM | POA: Insufficient documentation

## 2011-02-01 DIAGNOSIS — E669 Obesity, unspecified: Secondary | ICD-10-CM | POA: Insufficient documentation

## 2011-02-01 DIAGNOSIS — R002 Palpitations: Secondary | ICD-10-CM | POA: Insufficient documentation

## 2011-02-01 MED ORDER — TECHNETIUM TC 99M TETROFOSMIN IV KIT
11.0000 | PACK | Freq: Once | INTRAVENOUS | Status: AC | PRN
  Administered 2011-02-01: 11 via INTRAVENOUS

## 2011-02-01 MED ORDER — REGADENOSON 0.4 MG/5ML IV SOLN
0.4000 mg | Freq: Once | INTRAVENOUS | Status: AC
  Administered 2011-02-01: 0.4 mg via INTRAVENOUS

## 2011-02-01 MED ORDER — TECHNETIUM TC 99M TETROFOSMIN IV KIT
33.0000 | PACK | Freq: Once | INTRAVENOUS | Status: AC | PRN
  Administered 2011-02-01: 33 via INTRAVENOUS

## 2011-02-01 NOTE — Progress Notes (Signed)
[facility] SITE 3 NUCLEAR MED [address] [place] [address] [phone]  Cardiology Nuclear Med Study  [name] is a 77 y.o. male [id number] [birth year]   Nuclear Med Background Indication for Stress Test:  Evaluation for Ischemia, Surgical Clearance: Pending AAA (7.1cm) Repair vs EVAR by Dr. Leonides Galvan and Graft Patency History: '92 CABG, 04/12 Echo: EF 55-65% mild AR MR mod to severe TR, '02 GXT: (-) and '04 Myocardial Perfusion Study: EF 62% Infero-basal defect (-) ischemia Cardiac Risk Factors: History of Smoking, Hypertension, Lipids and Obesity  Symptoms:  Palpitations and SOB   Nuclear Pre-Procedure Caffeine/Decaff Intake:  None NPO After: 7:00pm   Lungs:  clear IV 0.9% NS with Angio Cath:  20g  IV Site: R Antecubital x 1, tolerated well IV Started by:  Johnny Hong, RN  Chest Size (in):  44 Cup Size: n/a  Height: 6' (1.829 m)  Weight:  240 lb (108.863 kg)  BMI:  Body mass index is 32.55 kg/(m^2). Tech Comments:  N/A    Nuclear Med Study 1 or 2 day study: 1 day  Stress Test Type:  Lexiscan  Reading MD: Johnny Ancona, MD  Order Authorizing Provider:  Shawnie Pons, MD  Resting Radionuclide: Technetium 36m Tetrofosmin  Resting Radionuclide Dose: 11.0 mCi   Stress Radionuclide:  Technetium 23m Tetrofosmin  Stress Radionuclide Dose: 33.0 mCi           Stress Protocol Rest HR: 79 Stress HR: 82  Rest BP: 160/100 Stress BP: 155/84  Exercise Time (min): n/a METS: n/a   Predicted Max HR: 146 bpm % Max HR: 56.16 bpm Rate Pressure Product: 21308   Dose of Adenosine (mg):  n/a Dose of Lexiscan: 0.4 mg  Dose of Atropine (mg): n/a Dose of Dobutamine: n/a mcg/kg/min (at max HR)  Stress Test Technologist: Milana Na, EMT-P  Nuclear Technologist:  Domenic Polite, CNMT     Rest Procedure:  Myocardial perfusion imaging was performed at rest 45 minutes following the intravenous administration of Technetium 16m Tetrofosmin. Rest ECG:  Atrial Fibrilliation  Stress Procedure:  The patient received IV Lexiscan 0.4 mg over 15-seconds.  Technetium 70m Tetrofosmin injected at 30-seconds.  There were no significant changes, + sob, and rare pvcs with Lexiscan.  Quantitative spect images were obtained after a 45 minute delay. Stress ECG: No significant change from baseline ECG  QPS Raw Data Images:  Normal; no motion artifact; normal heart/lung ratio. Stress Images:  Small basal inferior perfusion defect and small apical perfusion defect.  Rest Images:  Small basal inferior perfusion defect.  Small apical perfusion defect, less marked than on stress images. Subtraction (SDS):  Small fixed basal inferior perfusion defect.  Small, partially reversible apical perfusion defect.  Transient Ischemic Dilatation (Normal <1.22):  1.18 Lung/Heart Ratio (Normal <0.45):  0.34  Quantitative Gated Spect Images QGS EDV:  n/a QGS ESV:  n/a QGS cine images:  Study not gated QGS EF: Study not gated  Impression Exercise Capacity:  Lexiscan with no exercise. BP Response:  Normal Clinical Symptoms:  Short of breath.  ECG Impression:  Atrial fibrillation, no significant changes with infusion.  Comparison with Prior Nuclear Study: Similar to prior study (2004).   Overall Impression:  Low risk stress nuclear study.  Small fixed basal inferior perfusion defect suggestive of possible prior MI versus diaphragmatic attenuation.  Small partially reversible apical perfusion defect.  This could be a small area of infarction with peri-infarct ischemia.  Probably no significant change from prior  study.   [name] [employer]

## 2011-02-01 NOTE — Patient Instructions (Signed)
Your physician recommends that you continue on your current medications as directed. Please refer to the Current Medication list given to you today.  We will determine if further testing is needed after the patient is seen by Dr Imogene Burn on 02/04/11.

## 2011-02-03 ENCOUNTER — Encounter: Payer: Self-pay | Admitting: Physician Assistant

## 2011-02-04 ENCOUNTER — Ambulatory Visit (INDEPENDENT_AMBULATORY_CARE_PROVIDER_SITE_OTHER): Payer: Medicare Other | Admitting: Vascular Surgery

## 2011-02-04 ENCOUNTER — Encounter: Payer: Self-pay | Admitting: Vascular Surgery

## 2011-02-04 ENCOUNTER — Ambulatory Visit: Payer: Medicare Other

## 2011-02-04 VITALS — BP 148/88 | HR 87 | Resp 20 | Ht 72.0 in | Wt 242.0 lb

## 2011-02-04 DIAGNOSIS — I714 Abdominal aortic aneurysm, without rupture: Secondary | ICD-10-CM

## 2011-02-04 NOTE — Progress Notes (Signed)
[facility] [place]  Established Abdominal Aortic Aneurysm  History of Present Illness  The patient is a 77 y.o. male who presents with chief complaint: follow up cardiac w/u preop for AAA repair.  I discussed with the patient's cardiologist the plan to proceed with EVAR.  At this point, the cardiologist thinks he is okay to proceed with EVAR.  The patient's PMH, PSH, SH, FamHx, Med, Allergies and ROS are unchanged from 01/28/11.  Physical Examination  Filed Vitals:   02/04/11 1210  BP: 148/88  Pulse: 87  Resp: 20  Height: 6' (1.829 m)  Weight: 242 lb (109.77 kg)   Body mass index is 32.82 kg/(m^2).  General: A&O x 3, WDWN  Pulmonary: Sym exp, good air movt, CTAB, no rales, rhonchi, & wheezing  Cardiac: RRR, Nl S1, S2, no Murmurs, rubs or gallops  Vascular: Vessel Right Left  Radial Palpable Palpable  Brachial Palpable Palpable  Carotid Palpable, without bruit Palpable, without bruit  Aorta Non-palpable N/A  Femoral Palpable Palpable  Popliteal Non-palpable Non-palpable  PT Palpable Palpable  DP Palpable Palpable   Gastrointestinal: soft, NTND, -G/R, - HSM, - masses, - CVAT B  Musculoskeletal: M/S 5/5 throughout , Extremities without ischemic changes   Neurologic: Pain and light touch intact in extremities , Motor exam as listed above  Medical Decision Making  The patient is a 77 y.o. male who presents with: asx large AAA.   Based on this patient's exam and diagnostic studies, he needs EVAR, which is scheduled for 18 DEC 12 The patient is aware the risks of endovascular aortic surgery include but are not limited to: bleeding, need for transfusion, infection, death, stroke, paralysis, wound complications, bowel injuries, impotence, bowel ischemia, extended ventilation, future hernias, and possible future need for secondary interventions.  Overall, I cited a mortality rate of 1-2% and morbidity rate of 10-15%.  I emphasized the importance of  maximal medical management including strict control of blood pressure, blood glucose, and lipid levels, obtaining regular exercise, and cessation of smoking.    Thank you for allowing [place] to participate in this patient's care.  Leonides Sake, MD [facility] [place] Office: [phone] Pager: [phone]  02/04/2011, 1:45 PM

## 2011-02-06 NOTE — Assessment & Plan Note (Signed)
No current angina.  See the results of his nuclear scan.   A small apical perfusion defect is precisely what would be expected at that time, and there is not evidence of mutlizone ischemia currently.  In the absence of progressive symptoms, cath would not be indicated at this time.  With an aortic stent graft, it is necessary to proceed.  If surgery is needed, would favor cath.  Will await final recommendation.  Stent graft would not obviate an interventional approach at this point.

## 2011-02-06 NOTE — Assessment & Plan Note (Signed)
He currently does not have angina, and his nuclear study is read as low risk.  If he has an open repair, based on his 1992 anatomy, I would favor repeat cardiac cath given the size and scope of the procedure.  He would likely have more than the multiple lesion disease he had in 1992.  If a stent graft is planned, he likely can have this in the cath lab.  With any symptoms, it would not obviate the ability to approach his disease invasively.  Dr. Imogene Burn and I discussed this and he will let us know about the ultimate plan, but he thinks it can be done this way.

## 2011-02-06 NOTE — Assessment & Plan Note (Signed)
He is on rate control medications.  He had some mild hematuria on Pradaxa, and has decided he would like to avoid stroke prophylaxis therapy.  ASA is currently being administered on a multi use basis, CAD and others.

## 2011-02-06 NOTE — Progress Notes (Signed)
HPI:  To summarize, [name] is in for follow up.  He had hematuria after anticoagulation was started for atrial fib, was referred to Urology.  Of note, the patient then was then referred to Dr. Rennis Golden when a triple AAA was noted.  He sent him to the vascular surgeons, and he has seen Dr. Imogene Burn.  I have spoken with Dr. Imogene Burn who will be seeing him, and thinks he may be a candidate for a stent graft procedure.  He is not having angina.  He had a cath in 1992 with an angioplasty.  The cath diagram, which I have, shows an occluded intermediate, a 90% OM which was dilated, and scattered lesions with a 50% distal LAD, ectasia in the RCA and tandem 50% lesions in the RCA, and 60% in the PDA.  The films are not available as [facility] was getting rid of them at 7 years, and the report was on microfiche.  He has been stable.  His nuclear study is listed below in the note.  It was read as a low risk study, and the findings are as expected for what we knew his anatomy to be.    Current Outpatient Prescriptions  Medication Sig Dispense Refill  . aspirin EC 325 MG tablet One tablet by mouth every other day      . LEVOXYL 137 MCG tablet Take 1 tablet (137 mcg total) by mouth daily.  90 tablet  3  . pravastatin (PRAVACHOL) 80 MG tablet Take 1 tablet (80 mg total) by mouth daily.  90 tablet  3  . ramipril (ALTACE) 5 MG capsule Take 1 capsule (5 mg total) by mouth daily.  90 capsule  3  . sildenafil (VIAGRA) 50 MG tablet Take 1 tablet (50 mg total) by mouth daily as needed.  10 tablet  12  . verapamil (COVERA HS) 240 MG (CO) 24 hr tablet Take 1 tablet (240 mg total) by mouth at bedtime.  90 tablet  3    Allergies  Allergen Reactions  . Pradaxa     Blood in urine    Past Medical History  Diagnosis Date  . Special screening for malignant neoplasm of prostate   . Ventricular ectopy     mild  . Hyperglycemia   . Obesity   . Paroxysmal atrial fibrillation   . Hypothyroidism   . Hypertension   . Hyperlipidemia     . Coronary artery disease   . Organic impotence     Past Surgical History  Procedure Date  . Hernia repair 1997    left  . Coronary artery bypass graft 05-1990    x7   . Cardiac catheterization 03-1990    Positive  . Ett 10/31/00    WNL  . Hospital - afib 03/28/02  . Hospital 1988  . Cardiolite 2003    Spect Wall Motion - Neg.  Inferobas defect HTNSVE    Family History  Problem Relation Age of Onset  . Obesity Mother 66    deceased  . Hypertension Mother   . Diabetes Mother   . Alcohol abuse Sister 24    deceased  . Other Father     deceased unknown  . Colon cancer Neg Hx   . Prostate cancer Neg Hx     History   Social History  . Marital Status: Married    Spouse Name: N/A    Number of Children: N/A  . Years of Education: N/A   Occupational History  . retired -  [employer]     2001. Likes to play golf . Travels with motor home   Social History Main Topics  . Smoking status: Former Smoker -- 30 years    Types: Cigarettes    Quit date: 02/28/1990  . Smokeless tobacco: Never Used  . Alcohol Use: Yes     minimal  . Drug Use: No  . Sexually Active: Not on file   Other Topics Concern  . Not on file   Social History Narrative   From [place].Married, 2nd wife had lung CA 2009Divorced, remarried, lives with wife, 3 children (1st marriage)Likes to play golf, travels with motor home.Prev 4 sport athlete in HS, football fan.     ROS: Please see the HPI.  All other systems reviewed and negative.  PHYSICAL EXAM:  BP 130/68  Pulse 64  Ht 6' (1.829 m)  Wt 110.224 kg (243 lb)  BMI 32.96 kg/m2  General: Well developed, well nourished, in no acute distress. Head:  Normocephalic and atraumatic. Neck: no JVD Lungs: Clear to auscultation and percussion. Heart: Normal S1 and S2.  Irregularly irregular rhythm, no def murmur.   Abdomen:  Normal bowel sounds; soft; non tender; obese and I cannot appreciate an aneurysmal bulge.  Pulses: Pulses normal  in all 4 extremities. Extremities: No clubbing or cyanosis. No edema. Neurologic: Alert and oriented x 3.  Nuclear Study  02/01/2011  Rest Procedure: Myocardial perfusion imaging was performed at rest 45 minutes following the intravenous administration of Technetium 63m Tetrofosmin.  Rest ECG: Atrial Fibrilliation  Stress Procedure: The patient received IV Lexiscan 0.4 mg over 15-seconds. Technetium 19m Tetrofosmin injected at 30-seconds. There were no significant changes, + sob, and rare pvcs with Lexiscan. Quantitative spect images were obtained after a 45 minute delay.  Stress ECG: No significant change from baseline ECG  QPS  Raw Data Images: Normal; no motion artifact; normal heart/lung ratio.  Stress Images: Small basal inferior perfusion defect and small apical perfusion defect.  Rest Images: Small basal inferior perfusion defect. Small apical perfusion defect, less marked than on stress images.  Subtraction (SDS): Small fixed basal inferior perfusion defect. Small, partially reversible apical perfusion defect.  Transient Ischemic Dilatation (Normal <1.22): 1.18  Lung/Heart Ratio (Normal <0.45): 0.34  Quantitative Gated Spect Images  QGS EDV: n/a  QGS ESV: n/a  QGS cine images: Study not gated  QGS EF: Study not gated  Impression  Exercise Capacity: Lexiscan with no exercise.  BP Response: Normal  Clinical Symptoms: Short of breath.  ECG Impression: Atrial fibrillation, no significant changes with infusion.  Comparison with Prior Nuclear Study: Similar to prior study (2004).  Overall Impression: Low risk stress nuclear study. Small fixed basal inferior perfusion defect suggestive of possible prior MI versus diaphragmatic attenuation. Small partially reversible apical perfusion defect. This could be a small area of infarction with peri-infarct ischemia. Probably no significant change from prior study.  Marca Ancona      ASSESSMENT AND PLAN:

## 2011-02-07 ENCOUNTER — Other Ambulatory Visit: Payer: Self-pay

## 2011-02-07 ENCOUNTER — Encounter: Payer: Self-pay | Admitting: Family Medicine

## 2011-02-07 ENCOUNTER — Encounter (HOSPITAL_COMMUNITY): Payer: Self-pay

## 2011-02-10 ENCOUNTER — Encounter (HOSPITAL_COMMUNITY): Payer: Self-pay

## 2011-02-10 ENCOUNTER — Encounter (HOSPITAL_COMMUNITY)
Admission: RE | Admit: 2011-02-10 | Discharge: 2011-02-10 | Disposition: A | Discharge status: Home / Self Care | Payer: Medicare Other | Source: Ambulatory Visit | Attending: Anesthesiology | Admitting: Anesthesiology

## 2011-02-10 ENCOUNTER — Other Ambulatory Visit (HOSPITAL_COMMUNITY): Payer: Medicare Other

## 2011-02-10 ENCOUNTER — Encounter (HOSPITAL_COMMUNITY)
Admission: RE | Admit: 2011-02-10 | Discharge: 2011-02-10 | Disposition: A | Discharge status: Home / Self Care | Payer: Medicare Other | Source: Ambulatory Visit | Attending: Vascular Surgery | Admitting: Vascular Surgery

## 2011-02-10 HISTORY — DX: Diaphragmatic hernia without obstruction or gangrene: K44.9

## 2011-02-10 HISTORY — DX: Sleep apnea, unspecified: G47.30

## 2011-02-10 HISTORY — DX: Chronic kidney disease, unspecified: N18.9

## 2011-02-10 LAB — COMPREHENSIVE METABOLIC PANEL
ALT: 12 U/L (ref 0–53)
Alkaline Phosphatase: 65 U/L (ref 39–117)
BUN: 14 mg/dL (ref 6–23)
CO2: 22 mEq/L (ref 19–32)
Chloride: 106 mEq/L (ref 96–112)
GFR calc Af Amer: >90 mL/min (ref >90)
GFR calc non Af Amer: 83 mL/min — ABNORMAL LOW (ref >90)
Glucose, Bld: 105 mg/dL — ABNORMAL HIGH (ref 70–99)
Potassium: 4.3 mEq/L (ref 3.5–5.1)
Sodium: 140 mEq/L (ref 135–145)
Total Bilirubin: 0.5 mg/dL (ref 0.3–1.2)
Total Protein: 7.8 g/dL (ref 6.0–8.3)

## 2011-02-10 LAB — URINE MICROSCOPIC-ADD ON

## 2011-02-10 LAB — PROTIME-INR
INR: 1.09 (ref 0.00–1.49)
Prothrombin Time: 14.3 seconds (ref 11.6–15.2)

## 2011-02-10 LAB — ABO/RH: ABO/RH(D): AB POS

## 2011-02-10 LAB — URINALYSIS, ROUTINE W REFLEX MICROSCOPIC
Bilirubin Urine: NEGATIVE
Glucose, UA: NEGATIVE mg/dL
Specific Gravity, Urine: 1.017 (ref 1.005–1.030)
pH: 5.5 (ref 5.0–8.0)

## 2011-02-10 LAB — DIFFERENTIAL
Basophils Relative: 1 % (ref 0–1)
Eosinophils Absolute: 0.2 10*3/uL (ref 0.0–0.7)
Eosinophils Relative: 2 % (ref 0–5)
Lymphs Abs: 2.2 10*3/uL (ref 0.7–4.0)

## 2011-02-10 LAB — BLOOD GAS, ARTERIAL
Bicarbonate: 23.2 mEq/L (ref 20.0–24.0)
Drawn by: 3443841
FIO2: 0.21 %
pCO2 arterial: 35.6 mmHg (ref 35.0–45.0)
pO2, Arterial: 97.5 mmHg (ref 80.0–100.0)

## 2011-02-10 LAB — CBC
HCT: 45.3 % (ref 39.0–52.0)
Hemoglobin: 15.2 g/dL (ref 13.0–17.0)
RBC: 5 MIL/uL (ref 4.22–5.81)

## 2011-02-10 LAB — APTT: aPTT: 32 seconds (ref 24–37)

## 2011-02-10 LAB — TYPE AND SCREEN: ABO/RH(D): AB POS

## 2011-02-10 MED ORDER — DEXTROSE 5 % IV SOLN: 1.5000 g | INTRAVENOUS | Status: DC

## 2011-02-10 MED ORDER — SODIUM CHLORIDE 0.9 % IV SOLN: INTRAVENOUS | Status: DC

## 2011-02-10 NOTE — Pre-Procedure Instructions (Signed)
22 [name]  02/10/2011   Your procedure is scheduled on:  Tues.Dec 18 @ 0730  Report to [facility] at 0530 AM.  Call this number if you have problems the morning of surgery: [phone]   Remember:   Do not eat food:After Midnight.  May have clear liquids: up to 4 Hours before arrival.(until 1:30am)  Clear liquids include soda, tea, black coffee, apple or grape juice, broth.  Take these medicines the morning of surgery with A SIP OF WATER: Levoxyl   Do not wear jewelry, make-up or nail polish.  Do not wear lotions, powders, or perfumes. You may wear deodorant.  Do not shave 48 hours prior to surgery.  Do not bring valuables to the hospital.  Contacts, dentures or bridgework may not be worn into surgery.  Leave suitcase in the car. After surgery it may be brought to your room.  For patients admitted to the hospital, checkout time is 11:00 AM the day of discharge.   Patients discharged the day of surgery will not be allowed to drive home.  Name and phone number of your driver:   Special Instructions: CHG Shower Use Special Wash: 1/2 bottle night before surgery and 1/2 bottle morning of surgery.   Please read over the following fact sheets that you were given: Pain Booklet, Coughing and Deep Breathing, Blood Transfusion Information, MRSA Information and Surgical Site Infection Prevention

## 2011-02-14 ENCOUNTER — Other Ambulatory Visit: Payer: Self-pay | Admitting: Urology

## 2011-02-14 ENCOUNTER — Telehealth: Payer: Self-pay | Admitting: Cardiology

## 2011-02-14 MED ORDER — CIPROFLOXACIN IN D5W 200 MG/100ML IV SOLN
200.0000 mg | INTRAVENOUS | Status: AC
  Administered 2011-02-15: 400 mg via INTRAVENOUS
  Filled 2011-02-14: qty 100

## 2011-02-14 NOTE — Telephone Encounter (Signed)
New message:  Pt is to have bladder tumor removed in January.  Dr. Vernie Ammons wants him to d/c ASA 325 five days prior to this procedure.  Will this be ok for him to do?  Please call him back and advise.   Please call [phone] leave message if no answer.

## 2011-02-14 NOTE — Telephone Encounter (Signed)
Informed pt that we will check with Dr. Riley Kill and call back.

## 2011-02-15 ENCOUNTER — Encounter (HOSPITAL_COMMUNITY)
Admission: RE | Disposition: A | Discharge status: Home / Self Care | Payer: Self-pay | Source: Ambulatory Visit | Attending: Vascular Surgery

## 2011-02-15 ENCOUNTER — Inpatient Hospital Stay (HOSPITAL_COMMUNITY)
Admission: RE | Admit: 2011-02-15 | Discharge: 2011-02-16 | DRG: 238 | Disposition: A | Discharge status: Home / Self Care | Payer: Medicare Other | Source: Ambulatory Visit | Attending: Vascular Surgery | Admitting: Vascular Surgery

## 2011-02-15 ENCOUNTER — Encounter (HOSPITAL_COMMUNITY): Payer: Self-pay | Admitting: Anesthesiology

## 2011-02-15 ENCOUNTER — Inpatient Hospital Stay (HOSPITAL_COMMUNITY): Payer: Medicare Other

## 2011-02-15 ENCOUNTER — Inpatient Hospital Stay (HOSPITAL_COMMUNITY): Payer: Medicare Other | Admitting: Anesthesiology

## 2011-02-15 DIAGNOSIS — Z01812 Encounter for preprocedural laboratory examination: Secondary | ICD-10-CM

## 2011-02-15 DIAGNOSIS — I714 Abdominal aortic aneurysm, without rupture, unspecified: Principal | ICD-10-CM | POA: Diagnosis present

## 2011-02-15 DIAGNOSIS — I2510 Atherosclerotic heart disease of native coronary artery without angina pectoris: Secondary | ICD-10-CM

## 2011-02-15 DIAGNOSIS — G4730 Sleep apnea, unspecified: Secondary | ICD-10-CM | POA: Diagnosis present

## 2011-02-15 DIAGNOSIS — Z951 Presence of aortocoronary bypass graft: Secondary | ICD-10-CM

## 2011-02-15 DIAGNOSIS — R319 Hematuria, unspecified: Secondary | ICD-10-CM | POA: Diagnosis present

## 2011-02-15 DIAGNOSIS — Z01818 Encounter for other preprocedural examination: Secondary | ICD-10-CM

## 2011-02-15 DIAGNOSIS — E039 Hypothyroidism, unspecified: Secondary | ICD-10-CM | POA: Diagnosis present

## 2011-02-15 DIAGNOSIS — N529 Male erectile dysfunction, unspecified: Secondary | ICD-10-CM | POA: Diagnosis present

## 2011-02-15 DIAGNOSIS — E785 Hyperlipidemia, unspecified: Secondary | ICD-10-CM | POA: Diagnosis present

## 2011-02-15 DIAGNOSIS — E669 Obesity, unspecified: Secondary | ICD-10-CM | POA: Diagnosis present

## 2011-02-15 DIAGNOSIS — I10 Essential (primary) hypertension: Secondary | ICD-10-CM | POA: Diagnosis present

## 2011-02-15 DIAGNOSIS — Z7982 Long term (current) use of aspirin: Secondary | ICD-10-CM

## 2011-02-15 DIAGNOSIS — R7309 Other abnormal glucose: Secondary | ICD-10-CM | POA: Diagnosis present

## 2011-02-15 DIAGNOSIS — Z79899 Other long term (current) drug therapy: Secondary | ICD-10-CM

## 2011-02-15 DIAGNOSIS — I4891 Unspecified atrial fibrillation: Secondary | ICD-10-CM

## 2011-02-15 HISTORY — PX: ENDOVASCULAR STENT INSERTION: SHX5161

## 2011-02-15 LAB — PROTIME-INR
INR: 1.26 (ref 0.00–1.49)
Prothrombin Time: 16.1 seconds — ABNORMAL HIGH (ref 11.6–15.2)

## 2011-02-15 LAB — CBC
Hemoglobin: 12.3 g/dL — ABNORMAL LOW (ref 13.0–17.0)
MCH: 29.9 pg (ref 26.0–34.0)
MCH: 29.8 pg (ref 26.0–34.0)
MCHC: 32.8 g/dL (ref 30.0–36.0)
MCHC: 32.8 g/dL (ref 30.0–36.0)
Platelets: 172 10*3/uL (ref 150–400)
Platelets: 148 10*3/uL — ABNORMAL LOW (ref 150–400)
RBC: 4.11 MIL/uL — ABNORMAL LOW (ref 4.22–5.81)
RBC: 3.82 MIL/uL — ABNORMAL LOW (ref 4.22–5.81)
RDW: 14 % (ref 11.5–15.5)

## 2011-02-15 LAB — GLUCOSE, CAPILLARY: Glucose-Capillary: 100 mg/dL — ABNORMAL HIGH (ref 70–99)

## 2011-02-15 LAB — BASIC METABOLIC PANEL
BUN: 11 mg/dL (ref 6–23)
Calcium: 7.4 mg/dL — ABNORMAL LOW (ref 8.4–10.5)
GFR calc Af Amer: >90 mL/min (ref >90)
GFR calc non Af Amer: 89 mL/min — ABNORMAL LOW (ref >90)
Potassium: 3.5 mEq/L (ref 3.5–5.1)

## 2011-02-15 SURGERY — ENDOVASCULAR STENT GRAFT INSERTION
Anesthesia: General | Wound class: Clean

## 2011-02-15 MED ORDER — HYDROMORPHONE HCL PF 1 MG/ML IJ SOLN: 0.5000 mg | INTRAMUSCULAR | Status: DC | PRN

## 2011-02-15 MED ORDER — SODIUM CHLORIDE 0.9 % IR SOLN
Status: DC | PRN
  Administered 2011-02-15 (×2)

## 2011-02-15 MED ORDER — PHENOL 1.4 % MT LIQD: 1.0000 | OROMUCOSAL | Status: DC | PRN

## 2011-02-15 MED ORDER — HEPARIN SODIUM (PORCINE) 1000 UNIT/ML IJ SOLN
INTRAMUSCULAR | Status: DC | PRN
  Administered 2011-02-15: 8000 [IU] via INTRAVENOUS

## 2011-02-15 MED ORDER — PROPOFOL 10 MG/ML IV EMUL
INTRAVENOUS | Status: DC | PRN
  Administered 2011-02-15: 160 mg via INTRAVENOUS

## 2011-02-15 MED ORDER — ENOXAPARIN SODIUM 40 MG/0.4ML ~~LOC~~ SOLN
40.0000 mg | Freq: Every day | SUBCUTANEOUS | Status: DC
  Administered 2011-02-15: 40 mg via SUBCUTANEOUS
  Filled 2011-02-15 (×2): qty 0.4

## 2011-02-15 MED ORDER — ONDANSETRON HCL 4 MG/2ML IJ SOLN: 4.0000 mg | Freq: Four times a day (QID) | INTRAMUSCULAR | Status: DC | PRN

## 2011-02-15 MED ORDER — ASPIRIN EC 325 MG PO TBEC
325.0000 mg | DELAYED_RELEASE_TABLET | ORAL | Status: DC
  Administered 2011-02-15: 325 mg via ORAL
  Filled 2011-02-15: qty 1

## 2011-02-15 MED ORDER — GUAIFENESIN-DM 100-10 MG/5ML PO SYRP: 15.0000 mL | ORAL_SOLUTION | ORAL | Status: DC | PRN

## 2011-02-15 MED ORDER — LACTATED RINGERS IV SOLN: INTRAVENOUS | Status: DC

## 2011-02-15 MED ORDER — SIMVASTATIN 20 MG PO TABS
20.0000 mg | ORAL_TABLET | Freq: Every day | ORAL | Status: DC
  Filled 2011-02-15: qty 1

## 2011-02-15 MED ORDER — DOCUSATE SODIUM 100 MG PO CAPS
100.0000 mg | ORAL_CAPSULE | Freq: Every day | ORAL | Status: DC
  Administered 2011-02-16: 100 mg via ORAL
  Filled 2011-02-15: qty 1

## 2011-02-15 MED ORDER — MIDAZOLAM HCL 5 MG/5ML IJ SOLN
INTRAMUSCULAR | Status: DC | PRN
  Administered 2011-02-15: 2 mg via INTRAVENOUS

## 2011-02-15 MED ORDER — FAMOTIDINE IN NACL 20-0.9 MG/50ML-% IV SOLN
20.0000 mg | Freq: Two times a day (BID) | INTRAVENOUS | Status: DC
  Administered 2011-02-15: 20 mg via INTRAVENOUS
  Filled 2011-02-15: qty 50

## 2011-02-15 MED ORDER — OXYCODONE-ACETAMINOPHEN 5-325 MG PO TABS
1.0000 | ORAL_TABLET | ORAL | Status: DC | PRN
  Filled 2011-02-15: qty 2

## 2011-02-15 MED ORDER — SODIUM CHLORIDE 0.9 % IV SOLN: INTRAVENOUS | Status: DC

## 2011-02-15 MED ORDER — LACTATED RINGERS IV SOLN
INTRAVENOUS | Status: DC | PRN
  Administered 2011-02-15 (×2): via INTRAVENOUS

## 2011-02-15 MED ORDER — VERAPAMIL HCL 240 MG (CO) PO TB24
240.0000 mg | ORAL_TABLET | Freq: Every day | ORAL | Status: DC
  Administered 2011-02-15: 240 mg via ORAL
  Filled 2011-02-15 (×2): qty 1

## 2011-02-15 MED ORDER — METOPROLOL TARTRATE 1 MG/ML IV SOLN: 2.0000 mg | INTRAVENOUS | Status: DC | PRN

## 2011-02-15 MED ORDER — ALUM & MAG HYDROXIDE-SIMETH 200-200-20 MG/5ML PO SUSP: 15.0000 mL | ORAL | Status: DC | PRN

## 2011-02-15 MED ORDER — SODIUM CHLORIDE 0.9 % IV SOLN: 500.0000 mL | Freq: Once | INTRAVENOUS | Status: AC | PRN

## 2011-02-15 MED ORDER — HYDROMORPHONE HCL PF 1 MG/ML IJ SOLN: 0.2500 mg | INTRAMUSCULAR | Status: DC | PRN

## 2011-02-15 MED ORDER — RAMIPRIL 5 MG PO CAPS
5.0000 mg | ORAL_CAPSULE | Freq: Every day | ORAL | Status: DC
  Administered 2011-02-15 – 2011-02-16 (×2): 5 mg via ORAL
  Filled 2011-02-15 (×2): qty 1

## 2011-02-15 MED ORDER — DOPAMINE-DEXTROSE 3.2-5 MG/ML-% IV SOLN: 3.0000 ug/kg/min | INTRAVENOUS | Status: DC

## 2011-02-15 MED ORDER — PHENYLEPHRINE HCL 10 MG/ML IJ SOLN
10.0000 mg | INTRAVENOUS | Status: DC | PRN
  Administered 2011-02-15: 20 ug/min via INTRAVENOUS

## 2011-02-15 MED ORDER — SODIUM CHLORIDE 0.9 % IV SOLN
INTRAVENOUS | Status: DC | PRN
  Administered 2011-02-15 (×2): via INTRAVENOUS

## 2011-02-15 MED ORDER — ACETAMINOPHEN 325 MG PO TABS: 325.0000 mg | ORAL_TABLET | ORAL | Status: DC | PRN

## 2011-02-15 MED ORDER — IODIXANOL 320 MG/ML IV SOLN
INTRAVENOUS | Status: DC | PRN
  Administered 2011-02-15: 100.9 mL via INTRAVENOUS

## 2011-02-15 MED ORDER — MORPHINE SULFATE 2 MG/ML IJ SOLN: 0.0500 mg/kg | INTRAMUSCULAR | Status: DC | PRN

## 2011-02-15 MED ORDER — CIPROFLOXACIN IN D5W 400 MG/200ML IV SOLN
INTRAVENOUS | Status: AC
  Filled 2011-02-15: qty 200

## 2011-02-15 MED ORDER — LEVOTHYROXINE SODIUM 137 MCG PO TABS
137.0000 ug | ORAL_TABLET | Freq: Every day | ORAL | Status: DC
  Administered 2011-02-16: 137 ug via ORAL
  Filled 2011-02-15 (×2): qty 1

## 2011-02-15 MED ORDER — POTASSIUM CHLORIDE CRYS ER 20 MEQ PO TBCR: 20.0000 meq | EXTENDED_RELEASE_TABLET | Freq: Once | ORAL | Status: AC | PRN

## 2011-02-15 MED ORDER — MEPERIDINE HCL 25 MG/ML IJ SOLN: 6.2500 mg | INTRAMUSCULAR | Status: DC | PRN

## 2011-02-15 MED ORDER — ROCURONIUM BROMIDE 100 MG/10ML IV SOLN
INTRAVENOUS | Status: DC | PRN
  Administered 2011-02-15 (×2): 10 mg via INTRAVENOUS
  Administered 2011-02-15: 20 mg via INTRAVENOUS
  Administered 2011-02-15: 50 mg via INTRAVENOUS

## 2011-02-15 MED ORDER — ACETAMINOPHEN 650 MG RE SUPP: 325.0000 mg | RECTAL | Status: DC | PRN

## 2011-02-15 MED ORDER — DEXTROSE 5 % IV SOLN
1.5000 g | Freq: Two times a day (BID) | INTRAVENOUS | Status: AC
  Administered 2011-02-15 – 2011-02-16 (×2): 1.5 g via INTRAVENOUS
  Filled 2011-02-15 (×2): qty 1.5

## 2011-02-15 MED ORDER — MAGNESIUM SULFATE 40 MG/ML IJ SOLN: 2.0000 g | Freq: Once | INTRAMUSCULAR | Status: AC | PRN

## 2011-02-15 MED ORDER — GLYCOPYRROLATE 0.2 MG/ML IJ SOLN
INTRAMUSCULAR | Status: DC | PRN
  Administered 2011-02-15: .6 mg via INTRAVENOUS

## 2011-02-15 MED ORDER — FENTANYL CITRATE 0.05 MG/ML IJ SOLN
INTRAMUSCULAR | Status: DC | PRN
  Administered 2011-02-15: 200 ug via INTRAVENOUS
  Administered 2011-02-15: 50 ug via INTRAVENOUS

## 2011-02-15 MED ORDER — ROSUVASTATIN CALCIUM 5 MG PO TABS
5.0000 mg | ORAL_TABLET | Freq: Every day | ORAL | Status: DC
  Administered 2011-02-15: 5 mg via ORAL
  Filled 2011-02-15 (×2): qty 1

## 2011-02-15 MED ORDER — LABETALOL HCL 5 MG/ML IV SOLN: 10.0000 mg | INTRAVENOUS | Status: DC | PRN

## 2011-02-15 MED ORDER — LACTATED RINGERS IV SOLN
INTRAVENOUS | Status: DC | PRN
  Administered 2011-02-15 (×2): via INTRAVENOUS

## 2011-02-15 MED ORDER — NEOSTIGMINE METHYLSULFATE 1 MG/ML IJ SOLN
INTRAMUSCULAR | Status: DC | PRN
  Administered 2011-02-15: 4 mg via INTRAVENOUS

## 2011-02-15 MED ORDER — HYDRALAZINE HCL 20 MG/ML IJ SOLN
10.0000 mg | INTRAMUSCULAR | Status: DC | PRN
  Filled 2011-02-15: qty 0.5

## 2011-02-15 MED ORDER — LEVOTHYROXINE SODIUM 137 MCG PO TABS
137.0000 ug | ORAL_TABLET | Freq: Every day | ORAL | Status: DC
  Filled 2011-02-15: qty 1

## 2011-02-15 MED ORDER — PROMETHAZINE HCL 25 MG/ML IJ SOLN: 6.2500 mg | INTRAMUSCULAR | Status: DC | PRN

## 2011-02-15 MED ORDER — SODIUM CHLORIDE 0.9 % IV SOLN
INTRAVENOUS | Status: DC
  Administered 2011-02-15: 16:00:00 via INTRAVENOUS

## 2011-02-15 MED ORDER — SODIUM CHLORIDE 0.9 % IR SOLN
Status: DC | PRN
  Administered 2011-02-15: 1000 mL

## 2011-02-15 SURGICAL SUPPLY — 73 items
ADH SKN CLS APL DERMABOND .7 (GAUZE/BANDAGES/DRESSINGS) ×1
BAG DECANTER FOR FLEXI CONT (MISCELLANEOUS) IMPLANT
BALLN CODA OCL 2-9.0-35-120-3 (BALLOONS)
BALLOON COD OCL 2-9.0-35-120-3 (BALLOONS) IMPLANT
CANISTER SUCTION 2500CC (MISCELLANEOUS) ×2 IMPLANT
CATH BEACON 5.038 65CM KMP-01 (CATHETERS) ×1 IMPLANT
CATH EMB 4FR 80CM (CATHETERS) ×1 IMPLANT
CATH HEADHUNTER 5FR 65CM (MISCELLANEOUS) ×1 IMPLANT
CATH OMNI FLUSH .035X70CM (CATHETERS) ×1 IMPLANT
CLIP TI MEDIUM 24 (CLIP) ×1 IMPLANT
CLIP TI WIDE RED SMALL 24 (CLIP) ×1 IMPLANT
CLOTH BEACON ORANGE TIMEOUT ST (SAFETY) ×2 IMPLANT
COVER MAYO STAND STRL (DRAPES) ×2 IMPLANT
COVER PROBE W GEL 5X96 (DRAPES) ×1 IMPLANT
COVER SURGICAL LIGHT HANDLE (MISCELLANEOUS) ×4 IMPLANT
DERMABOND ADVANCED (GAUZE/BANDAGES/DRESSINGS) ×1
DERMABOND ADVANCED .7 DNX12 (GAUZE/BANDAGES/DRESSINGS) ×1 IMPLANT
DEVICE TORQUE 50000 (MISCELLANEOUS) ×1 IMPLANT
DRAIN CHANNEL 10F 3/8 F FF (DRAIN) IMPLANT
DRAIN CHANNEL 10M FLAT 3/4 FLT (DRAIN) IMPLANT
DRAPE C-ARM 42X72 X-RAY (DRAPES) ×2 IMPLANT
DRAPE TABLE COVER HEAVY DUTY (DRAPES) ×2 IMPLANT
ELECT CAUTERY BLADE 6.4 (BLADE) ×2 IMPLANT
ELECT REM PT RETURN 9FT ADLT (ELECTROSURGICAL) ×4
ELECTRODE REM PT RTRN 9FT ADLT (ELECTROSURGICAL) ×2 IMPLANT
EVACUATOR 3/16  PVC DRAIN (DRAIN)
EVACUATOR 3/16 PVC DRAIN (DRAIN) IMPLANT
EVACUATOR SILICONE 100CC (DRAIN) IMPLANT
EXCLUDER TRUNK (Endovascular Graft) ×1 IMPLANT
GLOVE BIO SURGEON STRL SZ7 (GLOVE) ×2 IMPLANT
GLOVE BIOGEL PI IND STRL 6.5 (GLOVE) IMPLANT
GLOVE BIOGEL PI IND STRL 7.5 (GLOVE) ×1 IMPLANT
GLOVE BIOGEL PI INDICATOR 6.5 (GLOVE) ×2
GLOVE BIOGEL PI INDICATOR 7.5 (GLOVE) ×4
GLOVE SS N UNI LF 7.5 STRL (GLOVE) ×1 IMPLANT
GOWN STRL NON-REIN LRG LVL3 (GOWN DISPOSABLE) ×8 IMPLANT
GRAFT BALLN CATH 65CM (STENTS) IMPLANT
GRAFT EXCLUDER LEG 14.5X12 (Endovascular Graft) ×1 IMPLANT
GUIDEWIRE ANGLED .035X150CM (WIRE) ×1 IMPLANT
INTRODUCER PERFORM 12 30 .038 (SHEATH) ×1 IMPLANT
KIT BASIN OR (CUSTOM PROCEDURE TRAY) ×2 IMPLANT
KIT ROOM TURNOVER OR (KITS) ×2 IMPLANT
NDL PERC 18GX7CM (NEEDLE) ×1 IMPLANT
NEEDLE PERC 18GX7CM (NEEDLE) ×2 IMPLANT
NS IRRIG 1000ML POUR BTL (IV SOLUTION) ×4 IMPLANT
PACK AORTA (CUSTOM PROCEDURE TRAY) ×2 IMPLANT
PAD ARMBOARD 7.5X6 YLW CONV (MISCELLANEOUS) ×4 IMPLANT
PENCIL BUTTON HOLSTER BLD 10FT (ELECTRODE) ×1 IMPLANT
SHEATH AVANTI 11CM 8FR (MISCELLANEOUS) ×1 IMPLANT
SHEATH BRITE TIP 8FR 23CM (MISCELLANEOUS) ×1 IMPLANT
SHEATH INTRODUCER 18X30 (VASCULAR PRODUCTS) ×2
SHEATH PERFORMER 18FRX30 (VASCULAR PRODUCTS) IMPLANT
STAPLER VISISTAT 35W (STAPLE) IMPLANT
STENT GRAFT BALLN CATH 65CM (STENTS) ×1
STOPCOCK MORSE 400PSI 3WAY (MISCELLANEOUS) ×2 IMPLANT
SUT ETHILON 3 0 PS 1 (SUTURE) IMPLANT
SUT MNCRL AB 4-0 PS2 18 (SUTURE) ×4 IMPLANT
SUT PROLENE 5 0 C 1 24 (SUTURE) IMPLANT
SUT VIC AB 2-0 CTX 36 (SUTURE) IMPLANT
SUT VIC AB 3-0 SH 27 (SUTURE)
SUT VIC AB 3-0 SH 27X BRD (SUTURE) IMPLANT
SYR 20CC LL (SYRINGE) ×4 IMPLANT
SYR 30ML LL (SYRINGE) IMPLANT
SYR 3ML LL SCALE MARK (SYRINGE) ×1 IMPLANT
SYR MEDRAD MARK V 150ML (SYRINGE) ×2 IMPLANT
SYRINGE 10CC LL (SYRINGE) ×6 IMPLANT
TOWEL OR 17X24 6PK STRL BLUE (TOWEL DISPOSABLE) ×4 IMPLANT
TOWEL OR 17X26 10 PK STRL BLUE (TOWEL DISPOSABLE) ×4 IMPLANT
TRAY FOLEY CATH 14FRSI W/METER (CATHETERS) ×2 IMPLANT
TUBING HIGH PRESSURE 120CM (CONNECTOR) ×2 IMPLANT
WATER STERILE IRR 1000ML POUR (IV SOLUTION) ×2 IMPLANT
WIRE AMPLATZ SS-J .035X180CM (WIRE) ×2 IMPLANT
WIRE BENTSON .035X145CM (WIRE) ×2 IMPLANT

## 2011-02-15 NOTE — Op Note (Addendum)
OPERATIVE NOTE   PROCEDURE:  1. Bilateral common femoral artery cutdown 2. Placement of catheter in aorta x 2 3. Aortogram 4. Repair of aorta with modular bifurcated prosthesis with one limb 5. Placement of right iliac limb 6. Radiology S&I  PRE-OPERATIVE DIAGNOSIS: large abdominal aortic aneurysm   POST-OPERATIVE DIAGNOSIS: same as above   CO-SURGEONS: Leonides Sake, MD; Cari Caraway, MD   ANESTHESIA: general   ESTIMATED BLOOD LOSS: 100 cc   FINDING(S):  1. No type I endoleak at end of case 2. Bilateral renal arteries widely patent at end of case 3. Successful exclusion of abdominal aortic aneurysm  4. Dopplerable bilateral posterior tibial artery signals  INDICATIONS:  [name] is 77 y.o. male who presents with asymptomatic large abdominal aortic aneurysm (7.1 cm).  Based on size criteria, I felt he needed repair of his abdominal aortic aneurysm. The patient is aware the risks of aortic surgery include but are not limited to: bleeding, need for transfusion, infection, death, stroke, paralysis, wound complications, impotence, bowel ischemia, extended ventilation and need for secondary procedures. Overall, I cited a mortality rate of 1-2% and morbidity rate of 15%.   DESCRIPTION:After full informed written consent was obtained from the patient, he was brought back to the operating room, amd placed supine upon the operating table. He already had a A-Galvan place. After obtaining adequate anesthesia, a Foley catheter was placed to monitor urine output in the operating room. He was prepped and draped in the standard fashion for either open aneurysm repair or endovascular aneurysm repair. I turned my attention to the left groin.  I made a oblique incision and dissected down to the common femoral artery.  I cannulated it with a 18-gauge needle and passed a Bentson wire up into the iliac system. The needle was exchanged for a 8-French sheath which was loaded over the wire into the common  femoral artery.  Then using a Kumpfe catheter and Benson wire, I was able to get up into the aorta and exchange the wire out for a Amplatz wire.   In a similar fashion, Dr. Edilia Bo gained access to the right common femoral artery, cannulated and placed a long 8-Fr sheath.  The patient was given 8000 units of Heparin which was a therapeutic bolus for the patient. I then exchanged the left sheath for a 18-Fr sheath.  Over the wire, I delivered the main body device, which was a 26-mm x 14-mm x 18-cm, which I delivered up to the level about L1. On Dr. Adele Dan side, then he loaded a pigtail catheter, this was connected to the power injector circuit and a power injector aortogram was completed to identify the renal arteries. The lowest artery was the right renal artery. I pulled the main body device to below this level and deployed it, taking care to take into account the angulation here. I was able to deploy the graft exactly below the level of the right renal artery avoiding covering it. At this point, Dr. Edilia Bo pulled back the pigtail catheter and wire below the flow divider and exchanged the wire for a Glidewire and the catheter for a H1 catheter.  Using this combination, he was able to cannulate the contralateral gate which was in a cross limb configuration. He was then able to advance both of these through the graft. The catheter was then exchanged for a pigtail catheter.  The pigtail was then pulled down into the graft and rotated to demonstrate successful cannulation of the graft. He then pulled  the catheter down to the flow bifurcation and then pulled the sheath back on the left side and did a retrograde injection from the sheath demonstrating the level of the internal iliac artery. Then subsequently based on the measurements, a 14-mm x 12 cm contralateral limb was selected. At this point, the left 8-French sheath was exchanged out for a 12-French sheath which was then advanced up to level of flow divider and  the device. The contralateral limb was then placed through this sheath and then the sheath was pulled back to appropriate location. The iliac device was then deployed with adequate overlap. He then removed the sheath deployment system. At this point, I fully deployed the main body, fully extending the partially constrained limb and also releasing the device from the delivery system. The delivery system was then removed. We then obtained a molding balloon which was used to mold the top, the flow divider and the ends of each iliac limb. The pigtail catheter was replaced on the left side and then a completion aortogram was completed. This demonstrated no type I endoleak but inadequate wall apposition of the left side of the graft against the aorta.  Subsequently, I reballooned the top of the graft.  The completion on this run demonstrated a small type I endoleak.  I replaced the balloon and ballooned more robustly the superior aspect the of the graft and the main body of the graft.  There was no further endoleak at this point.  The left side of the graft is still not fully apposed to the wall, but there is greater than 2 cm of sealed neck adjacent to this area.  At this point, we turned our attention to the groins.  On the left side, I pulled the sheath while holding pressure.  The blood pressure remained unchanged, so I pulled the wire and clamped off the common femoral artery proximally and distally.  The left common femoral artery was repaired with a running stitch of 5-0 Prolene.  Prior to this repair, I allowed the proximal and distal common femoral artery to backbleed.  There was no clot and there was good pulsatile bleeding from both ends.  In a similar fashion, the sheath was removed from the right common femoral artery by Dr. Edilia Bo.  Again there was no drop in blood pressure with removing the sheath.  The right common femoral artery was repaired in a similar fashion.  Both groins were then washed out with  sterile saline, and there was no active bleeding present.  Each groin was repaired with a double layer of 2-0 Vicryl and a double layer of 3-0 Vicryl and finally the skin was repaired with a running subcuticular of 4-0 Monocryl.  The skin was clean, dried, and reinforced with Dermabond.  Each foot was then tested: both had dopplerable posterior tibial signals.   SPECIMEN(S): none   COMPLICATIONS: none   CONDITION: stable   Leonides Sake, MD [facility] Office: [phone] Pager: [phone]  02/15/2011, 10:30 AM

## 2011-02-15 NOTE — Anesthesia Preprocedure Evaluation (Addendum)
Anesthesia Evaluation  Patient identified by MRN, date of birth, ID band Patient awake    Reviewed: Allergy & Precautions, H&P , NPO status , Patient's Chart, lab work & pertinent test results  Airway Mallampati: II  Neck ROM: Full    Dental  (+) Edentulous Upper, Edentulous Lower and Dental Advisory Given   Pulmonary sleep apnea ,    Pulmonary exam normal       Cardiovascular Exercise Tolerance: Good hypertension, + CAD + dysrhythmias Atrial Fibrillation Irregular Normal    Neuro/Psych PSYCHIATRIC DISORDERS Anxiety Negative Neurological ROS     GI/Hepatic Neg liver ROS, hiatal hernia, Small on CT   Endo/Other  Hypothyroidism   Renal/GU negative Renal ROS Bladder dysfunction  Hx of hematuria    Musculoskeletal negative musculoskeletal ROS (+)   Abdominal   Peds  Hematology negative hematology ROS (+)   Anesthesia Other Findings   Reproductive/Obstetrics negative OB ROS                         Anesthesia Physical Anesthesia Plan  ASA: III  Anesthesia Plan: General   Post-op Pain Management:    Induction: Intravenous  Airway Management Planned: Oral ETT  Additional Equipment: Arterial line and CVP  Intra-op Plan:   Post-operative Plan: Possible Post-op intubation/ventilation  Informed Consent: I have reviewed the patients History and Physical, chart, labs and discussed the procedure including the risks, benefits and alternatives for the proposed anesthesia with the patient or authorized representative who has indicated his/her understanding and acceptance.   Dental advisory given  Plan Discussed with: CRNA  Anesthesia Plan Comments:        Anesthesia Quick Evaluation

## 2011-02-15 NOTE — Plan of Care (Signed)
Problem: Consults Goal: Diagnosis CEA/CES/AAA Stent Outcome: Completed/Met Date Met:  02/15/11 Abdominal Aortic Aneurysm Stent (AAA)

## 2011-02-15 NOTE — H&P (View-Only) (Signed)
[facility]  Referred by: Mark C Ottelin, MD [address] [place], [place] [address]  Reason for referral: AAA  History of Present Illness  The patient is a 77 y.o. male who presents with chief complaint: AAA.  Pt underwent CT abd/pelvis for possible kidney stone, given recent hematuria.  A large AAA was found during that study.  The patient does not have back or abdominal pain.  The patient does not history of embolic episodes from the AAA.  The patient's risk factors for AAA included: male, age, and prior smoking history.  The patient not currently smoke cigarettes.  Past Medical History  Diagnosis Date  . Special screening for malignant neoplasm of prostate   . Ventricular ectopy     mild  . Hyperglycemia   . Obesity   . Paroxysmal atrial fibrillation   . Hypothyroidism   . Hypertension   . Hyperlipidemia   . Coronary artery disease   . Organic impotence     Past Surgical History  Procedure Date  . Hernia repair 1997    left  . Coronary artery bypass graft 05-1990    x7   . Cardiac catheterization 03-1990    Positive  . Ett 10/31/00    WNL  . Hospital - afib 03/28/02  . Hospital 1988  . Cardiolite 2003    Spect Wall Motion - Neg.  Inferobas defect HTNSVE    History   Social History  . Marital Status: Married    Spouse Name: N/A    Number of Children: N/A  . Years of Education: N/A   Occupational History  . retired - [employer]     2001. Likes to play golf . Travels with motor home   Social History Main Topics  . Smoking status: Former Smoker    Types: Cigarettes    Quit date: 02/28/1990  . Smokeless tobacco: Not on file  . Alcohol Use: Yes     minimal  . Drug Use: No  . Sexually Active: Not on file   Other Topics Concern  . Not on file   Social History Narrative   From [place].Married, 2nd wife had lung CA 2009Divorced, remarried, lives with wife, 3 children (1st marriage)Likes to play golf, travels  with motor home.Prev 4 sport athlete in HS, football fan.     Family History  Problem Relation Age of Onset  . Obesity Mother 68    deceased  . Hypertension Mother   . Diabetes Mother   . Alcohol abuse Sister 46    deceased  . Other Father     deceased unknown  . Colon cancer Neg Hx   . Prostate cancer Neg Hx     Current Outpatient Prescriptions on File Prior to Visit  Medication Sig Dispense Refill  . aspirin EC 325 MG tablet One tablet by mouth every other day      . LEVOXYL 137 MCG tablet Take 1 tablet (137 mcg total) by mouth daily.  90 tablet  3  . pravastatin (PRAVACHOL) 80 MG tablet Take 1 tablet (80 mg total) by mouth daily.  90 tablet  3  . ramipril (ALTACE) 5 MG capsule Take 1 capsule (5 mg total) by mouth daily.  90 capsule  3  . sildenafil (VIAGRA) 50 MG tablet Take 1 tablet (50 mg total) by mouth daily as needed.  10 tablet  12  . verapamil (COVERA HS) 240 MG (CO) 24 hr tablet Take 1 tablet (240 mg   total) by mouth at bedtime.  90 tablet  3    Allergies  Allergen Reactions  . Pradaxa     Blood in urine    REVIEW OF SYSTEMS:  (Positives checked otherwise negative)  CARDIOVASCULAR: [ ] chest pain    [ ] chest pressure    [ ] palpitations    [ ] orthopnea   [ ] dyspnea on exert. [ ] claudication    [ ] rest pain     [ ] DVT     [ ] phlebitis  PULMONARY:    [ ] productive cough [ ] asthma  [ ] wheezing  NEUROLOGIC:    [ ] weakness    [ ] paresthesias   [ ] aphasia    [ ] amaurosis    [ ] dizziness  HEMATOLOGIC:    [ ] bleeding problems  [ ] clotting disorders  MUSCULOSKEL: [ ] joint pain     [ ] joint swelling  GASTROINTEST:  [ ]  blood in stool   [ ]  hematemesis  GENITOURINARY:   [ ]  dysuria    [x]  hematuria  PSYCHIATRIC:   [ ] history of major depression  INTEGUMENTARY: [ ] rashes    [ ] ulcers  CONSTITUTIONAL:  [ ] fever     [ ] chills  Physical Examination  Filed Vitals:   01/28/11 0934  BP: 183/119  Pulse: 84  Resp: 14  Height: 6' (1.829  m)  Weight: 242 lb (109.77 kg)  SpO2: 99%   Body mass index is 32.82 kg/(m^2).  General: A&O x 3, WDWN, obese  Head: [place]/AT  Ear/Nose/Throat: Hearing grossly intact, nares w/o erythema or drainage, oropharynx w/o Erythema/Exudate  Eyes: PERRLA, EOMI  Neck: Supple, no nuchal rigidity, no palpable LAD  Pulmonary: Sym exp, good air movt, CTAB, no rales, rhonchi, & wheezing  Cardiac: RRR, Nl S1, S2, no Murmurs, rubs or gallops  Vascular: Vessel Right Left  Radial Palpable Palpable  Brachial Palpable Palpable  Carotid Palpable, without bruit Palpable, with bruit  Aorta Palpable N/A  Femoral Palpable Palpable  Popliteal Non-palpable Non-palpable  PT Non-Palpable Non-Palpable  DP Non-Palpable Non-Palpable   Gastrointestinal: soft, NTND, -G/R, - HSM, - masses, - CVAT B, obese, palpable pulsatile mass  Musculoskeletal: M/S 5/5 throughout , Extremities without ischemic changes   Neurologic: CN 2-12 intact , Pain and light touch intact in extremities , Motor exam as listed above  Psychiatric: Judgment intact, Mood & affect appropriate for pt's clinical situation  Dermatologic: See M/S exam for extremity exam, no rashes otherwise noted  Lymph : No Cervical, Axillary, or Inguinal lymphadenopathy   Non-Invasive Vascular Imaging  BLE ABI (Date: 01/28/11)  RLE: 1.21, PT and AT: biphasic  LLE: 1.10, PT and AT: biphasic  CT Abd/Pelvis w/ and w/o contrast (01/28/11) 1. Bilateral calcifications within kidneys. Especially on the right, favored to be vascular. A punctate left renal collecting system stone cannot be excluded. 2. Soft tissue density within the dependent urinary bladder. Concurrent prostatomegaly. This could represent prominent median lobe of the prostate. An adjacent bladder mass cannot be excluded.  Consider correlation with cystoscopy. 3. No other explanation for hematuria. 4. 7.1 cm abdominal aortic aneurysm with mild extension into the left common iliac  artery.  Based my review of the CT, the patient has a large AAA which appears to have adequate neck for a Gore C3.  There is some atherosclerosis in the iliac arterial systems, but they appear   large enough to accommodate an endograft.  There is some tortuousity but I think it is likely the main body could pass through the left iliac arterial system.  Outside Studies/Documentation 10 pages of outside documents were reviewed including: outside CT report and clinic workup to date  Medical Decision Making  The patient is a 77 y.o. male who presents with: asx large AAA.   Based on this patient's exam and diagnostic studies, he needs open AAA repair vs. EVAR.  The patient also needs preoperative cardiac clearance by his cardiologist, Dr. Stuckey, which recently saw this patient.  I will have more Gore rep. review the CT on Monday to see his device is compatible with his patient's anatomy.  Regardless, the plan is to repair this patient within the next two weeks.  I emphasized the importance of maximal medical management including strict control of blood pressure, blood glucose, and lipid levels, obtaining regular exercise, and cessation of smoking.    He will follow up with me this coming Friday to finalize the plan and complete preoperative counseling.  Thank you for allowing us to participate in this patient's care.  Harinder Romas, MD [facility] Office: [phone] Pager: [phone]  01/28/2011, 7:08 PM      

## 2011-02-15 NOTE — Anesthesia Procedure Notes (Addendum)
Procedure Name: Intubation Date/Time: 02/15/2011 8:06 AM Performed by: Tyrone Nine Pre-anesthesia Checklist: Patient identified, Emergency Drugs available, Suction available and Patient being monitored Patient Re-evaluated:Patient Re-evaluated prior to inductionOxygen Delivery Method: Circle System Utilized Preoxygenation: Pre-oxygenation with 100% oxygen Intubation Type: IV induction Ventilation: Oral airway inserted - appropriate to patient size and Mask ventilation with difficulty Laryngoscope Size: Mac and 3 Grade View: Grade I Tube type: Oral Tube size: 8.0 mm Number of attempts: 1 Airway Equipment and Method: stylet Placement Confirmation: ETT inserted through vocal cords under direct vision,  breath sounds checked- equal and bilateral and positive ETCO2 Secured at: 23 cm Tube secured with: Tape Dental Injury: Teeth and Oropharynx as per pre-operative assessment    Procedures: Right IJ Theone Murdoch Catheter Insertion: [id number]  The patient was identified and consent obtained.  TO was performed, and full barrier precautions were used.  The skin was anesthetized with lidocaine-4cc plain with 25g needle.  Once the vein was located with the 22 ga. needle using ultrasound guidance , the wire was inserted into the vein.  The wire location was confirmed with ultrasound.  The tissue was dilated and the 8.5 [place] cordis catheter was carefully inserted. Sterile dressing applied. The patient tolerated the procedure well.

## 2011-02-15 NOTE — Addendum Note (Signed)
Addendum  created 02/15/11 1213 by Remonia Richter, MD   Modules edited:Anesthesia LDA

## 2011-02-15 NOTE — Anesthesia Postprocedure Evaluation (Signed)
Anesthesia Post Note  Patient: [name]  Procedure(s) Performed:  ENDOVASCULAR STENT GRAFT INSERTION - Insertion of endovascular stent graft   Anesthesia type: General  Patient location: PACU  Post pain: Pain level controlled  Post assessment: Patient's Cardiovascular Status Stable  Last Vitals:  Filed Vitals:   02/15/11 1130  BP:   Pulse: 52  Temp:   Resp: 18    Post vital signs: Reviewed and stable  Level of consciousness: sedated  Complications: No apparent anesthesia complications

## 2011-02-15 NOTE — Op Note (Signed)
NAME: [name]      MRN: [id number] DOB: Nov 11, 1933    DATE OF OPERATION: 02/15/2011  PREOP DIAGNOSIS: abdominal aortic aneurysm  POSTOP DIAGNOSIS: same  PROCEDURE:  1. Exposure of right common femoral artery 2. Placement of contralateral limb for EVAR 3. Primary closure of right common femoral artery  COSURGEONS: Leonides Sake, MD, Chrristopher Edilia Bo, MD  ANESTHESIA: Gen.   EBL: minimal  TECHNIQUE: The patient was brought to the operating room after monitoring lines were placed by anesthesia. Both groins the abdomen and lower extremities were prepped and draped in the usual sterile fashion. On the right side, a transverse incision was made just above the inguinal crease below the inguinal ligament. Through this incision the common femoral, deep femoral, and superficial femoral arteries were dissected free and controlled with vessel loops. A small counter incision was made laterally and superiorly to allow retraction on the loop. A long 8 French sheath was introduced over a wire on the right side using Seldinger technique. This was advanced into the suprarenal aorta and then a pigtail catheter positioned from the side. As dictated separately by Dr. Imogene Burn, the cutdown on the left and introduction of the main body ipsilateral limb were introduced as per his dictation. Once the main body ipsilateral limb had been deployed the contralateral gate was cannulated using a page 1 catheter and angled Glidewire. The wire was then advanced into the graft and exchanged over the catheter for a Amplatz wire. The pigtail catheter was then positioned within the trunk and turned to be sure that it was intraluminal. Next wire was readvanced and the 8 [place] sheath exchanged for a 12 [place] sheath. This was positioned into the contralateral limb. The 14 x 12 Gore contralateral limb was introduced over the wire with 3 cm of overlap. This was then deployed without difficulty. At the completion the overlap area  between the 2 grafts in the distal aspect of the graft was ballooned under fluoroscopic vision. At the completion of the procedure the wire and sheath were removed from the right groin. The artery was clamped proximally and distally. There was a small amount of clot which was performed at the arteriotomy site this was flushed vigorously with heparinized saline. It did pass a #4 Fogarty catheter approximately 25 cm before meeting a chronic obstruction. No clot was retrieved from the superficial femoral artery. The arteriotomy was then closed with 2 5-0 Prolene sutures. There was excellent Doppler flow at completion. The groin incision was then irrigated and hemostasis obtained. The wound was closed with 2 deep layers of 2-0 Vicryl the skin closed with 40 on-call suture. A small counter incision was closed with a 4-0 Monocryl suture. Sterile dressing was applied patient tolerated procedure well and was transferred to the recovery room in stable condition.  Waverly Ferrari, MD, FACS [facility]  DATE OF DICTATION:   02/15/2011

## 2011-02-15 NOTE — Interval H&P Note (Signed)
--    [facility] [place]  History and Physical Update  The patient was interviewed and re-examined.  The patient's History and Physical has been reviewed and is unchanged except for: interval update on 02/04/11.  There is no change in the plan of care.  The patient's cardiologist has cleared this patient for an EVAR.  Leonides Sake, MD [facility] [place] Office: [phone] Pager: [phone]  02/15/2011, 7:14 AM

## 2011-02-15 NOTE — Progress Notes (Signed)
DR Imogene Burn AT BEDSIDE, AWARE HOW POSITIONAL RIJ CL IS, CAPPED AND NSL. AWARE NO DOPPLER DP ON RIGHT

## 2011-02-15 NOTE — Preoperative (Signed)
Beta Blockers   Reason not to administer Beta Blockers:Not Applicable 

## 2011-02-15 NOTE — Addendum Note (Signed)
Addendum  created 02/15/11 1213 by Rodrick Payson Daniel Jac Romulus, MD   Modules edited:Anesthesia LDA    

## 2011-02-15 NOTE — Transfer of Care (Signed)
Immediate Anesthesia Transfer of Care Note  Patient: [name]  Procedure(s) Performed:  ENDOVASCULAR STENT GRAFT INSERTION - Insertion of endovascular stent graft   Patient Location: PACU  Anesthesia Type: General  Level of Consciousness: awake, alert , oriented and patient cooperative  Airway & Oxygen Therapy: Patient Spontanous Breathing and Patient connected to nasal cannula oxygen  Post-op Assessment: Report given to PACU RN and Post -op Vital signs reviewed and stable  Post vital signs: Reviewed and stable  Complications: No apparent anesthesia complications

## 2011-02-16 ENCOUNTER — Other Ambulatory Visit: Payer: Self-pay | Admitting: Physician Assistant

## 2011-02-16 ENCOUNTER — Encounter (HOSPITAL_COMMUNITY): Payer: Self-pay | Admitting: Vascular Surgery

## 2011-02-16 DIAGNOSIS — I714 Abdominal aortic aneurysm, without rupture: Secondary | ICD-10-CM

## 2011-02-16 LAB — BASIC METABOLIC PANEL
Chloride: 106 mEq/L (ref 96–112)
GFR calc Af Amer: >90 mL/min (ref >90)
GFR calc non Af Amer: 83 mL/min — ABNORMAL LOW (ref >90)
Potassium: 3.9 mEq/L (ref 3.5–5.1)
Sodium: 138 mEq/L (ref 135–145)

## 2011-02-16 LAB — CBC
MCHC: 32.4 g/dL (ref 30.0–36.0)
Platelets: 149 10*3/uL — ABNORMAL LOW (ref 150–400)
RDW: 14 % (ref 11.5–15.5)
WBC: 7.7 10*3/uL (ref 4.0–10.5)

## 2011-02-16 MED ORDER — OXYCODONE-ACETAMINOPHEN 5-325 MG PO TABS: 1.0000 | ORAL_TABLET | ORAL | Status: AC | PRN

## 2011-02-16 NOTE — Progress Notes (Signed)
Pt discharged to home per MD order.  Provided pt with discharge instructions.  Pt verbalized understanding and denied any questions or concerns.  Pt taken to vehicle via W/C by RN.  Vitals WNL.

## 2011-02-16 NOTE — Telephone Encounter (Signed)
Per verbal order from Dr Riley Kill this pt can hold ASA five days prior to Urology procedure. I made the pt's wife aware of Dr Rosalyn Charters recommendation.  She said that they may be postponing the Urology procedure.

## 2011-02-16 NOTE — Discharge Summary (Signed)
[facility] Discharge Summary  [name] 17-Jul-1933 77 y.o. male  [id number]  Admission Date: 02/15/2011  Discharge Date: 02/16/11  Physician: Nilda Simmer, MD  Admission Diagnosis: Abdominal Aortic Aneurysm   HPI:   This is a 77 y.o. male who presents with chief complaint: AAA. Pt underwent CT abd/pelvis for possible kidney stone, given recent hematuria. A large AAA was found during that study. The patient does not have back or abdominal pain. The patient does not history of embolic episodes from the AAA. The patient's risk factors for AAA included: male, age, and prior smoking history. The patient not currently smoke cigarettes.    Hospital Course: The patient was admitted to the hospital and taken to the operating room on 02/15/2011 and underwent: 1.  Bilateral common femoral artery cutdown 2. Placement of catheter in aorta x 2 3. Aortogram 4. Repair of aorta with modular bifurcated prosthesis with one limb 5. Placement of right iliac limb 6. Radiology S&I .  The pt tolerated the procedure well and was transported to the PACU in good condition. By POD 1, he is doing quite well.  His groins are c/d/i without hematoma. The remainder of the hospital course consisted of increasing ambulation and increasing intake of solids without difficulty.    Basename 02/16/11 0400 02/15/11 1524  NA 138 138  K 3.9 3.5  CL 106 109  CO2 24 22  GLUCOSE 103* 83  BUN 10 11  CALCIUM 8.3* 7.4*    Basename 02/16/11 0400 02/15/11 1524  WBC 7.7 8.6  HGB 12.1* 11.4*  HCT 37.4* 34.8*  PLT 149* 148*    Basename 02/15/11 1100  INR 1.26     Discharge Instructions:   The patient is discharged to home with extensive instructions on wound care and progressive ambulation.  They are instructed not to drive or perform any heavy lifting until returning to see the physician in his office.  Discharge Orders    Future Appointments: Provider: Department: Dept  Phone: Center:   05/05/2011 9:45 AM Shawnie Pons, MD [facility] [phone] [facility]     Future Orders Please Complete By Expires   Resume previous diet      Driving Restrictions      Comments:   No driving for 4 weeks   Lifting restrictions      Comments:   No lifting for 6 weeks   Call MD for:  temperature >100.5      Call MD for:  redness, tenderness, or signs of infection (pain, swelling, bleeding, redness, odor or green/yellow discharge around incision site)      Call MD for:  severe or increased pain, loss or decreased feeling  in affected limb(s)      may wash over wound with mild soap and water      Scheduling Instructions:   Shower daily with soap and water starting February 17, 2011   ABDOMINAL PROCEDURE/ANEURYSM REPAIR/AORTO-BIFEMORAL BYPASS:  Call MD for increased abdominal pain; cramping diarrhea; nausea/vomiting         Discharge Diagnosis:  Abdominal Aortic Aneurysm  Secondary Diagnosis: Patient Active Problem List  Diagnoses  . HYPOTHYROIDISM NOS  . OBESITY  . HYPERTENSION  . CORONARY ARTERY DISEASE  . PAROXYSMAL ATRIAL FIBRILLATION  . HYPERGLYCEMIA  . ORGANIC IMPOTENCE  . Hyperlipidemia  . Hematuria  . Colon cancer screening  . AAA (abdominal aortic aneurysm)   Past Medical History  Diagnosis Date  . Special screening for malignant neoplasm of prostate   .  Hyperglycemia   . Obesity   . Hypothyroidism   . Hyperlipidemia   . Organic impotence   . Coronary artery disease 1992     7 Bypasses  . Hypertension   . Ventricular ectopy     mild  . Paroxysmal atrial fibrillation   . Sleep apnea     mild- tested  2012  . Chronic kidney disease     blood in urine- sees Dr. Wynelle Link  . Hiatal hernia     repaired in 1992       [place], [place]  Home Medication Instructions [id number]   Printed on:02/16/11 8119  Medication Information                    aspirin EC 325 MG tablet Take 325 mg by mouth every other day. One tablet by  mouth every other day           LEVOXYL 137 MCG tablet Take 1 tablet (137 mcg total) by mouth daily.           pravastatin (PRAVACHOL) 80 MG tablet Take 1 tablet (80 mg total) by mouth daily.           ramipril (ALTACE) 5 MG capsule Take 1 capsule (5 mg total) by mouth daily.           verapamil (COVERA HS) 240 MG (CO) 24 hr tablet Take 1 tablet (240 mg total) by mouth at bedtime.           sildenafil (VIAGRA) 50 MG tablet Take 50 mg by mouth daily as needed. For erectile dysfunction.            oxyCODONE-acetaminophen (PERCOCET) 5-325 MG per tablet Take 1-2 tablets by mouth every 4 (four) hours as needed.             Disposition: home   Patient's condition: is Good  Follow up:  Dr. Imogene Burn 4 weeks.   Newton Pigg, PA-C [facility] [phone] 02/16/2011  8:04 AM  Addendum  I have independently interviewed and examined the patient, and I agree with the physician assistant's findings.    Leonides Sake, MD [facility] [place] Office: [phone] Pager: [phone]  02/16/2011, 8:46 AM

## 2011-02-16 NOTE — Progress Notes (Addendum)
[facility] Progress Note  02/16/2011 7:55 AM POD 1  No complaints; states he bled a little when foley removed.  Filed Vitals:   02/16/11 0719  BP:   Pulse:   Temp: 99.1 F (37.3 C)  Resp:     Incisions:  Bilateral groin incisions are c/d/i.  No hematoma Extremities:  Warm; + palpable DP on left; + palpable PT on the right Abdomen:  Soft; NT/ND  CBC    Component Value Date/Time   WBC 7.7 02/16/2011 0400   RBC 4.05* 02/16/2011 0400   HGB 12.1* 02/16/2011 0400   HCT 37.4* 02/16/2011 0400   PLT 149* 02/16/2011 0400   MCV 92.3 02/16/2011 0400   MCH 29.9 02/16/2011 0400   MCHC 32.4 02/16/2011 0400   RDW 14.0 02/16/2011 0400   LYMPHSABS 2.2 02/10/2011 0849   MONOABS 0.6 02/10/2011 0849   EOSABS 0.2 02/10/2011 0849   BASOSABS 0.1 02/10/2011 0849    BMET    Component Value Date/Time   NA 138 02/16/2011 0400   K 3.9 02/16/2011 0400   CL 106 02/16/2011 0400   CO2 24 02/16/2011 0400   GLUCOSE 103* 02/16/2011 0400   BUN 10 02/16/2011 0400   CREATININE 0.87 02/16/2011 0400   CALCIUM 8.3* 02/16/2011 0400   GFRNONAA 83* 02/16/2011 0400   GFRAA >90 02/16/2011 0400    INR    Component Value Date/Time   INR 1.26 02/15/2011 1100     Intake/Output Summary (Last 24 hours) at 02/16/11 0755 Last data filed at 02/16/11 0700  Gross per 24 hour  Intake   5070 ml  Output   2800 ml  Net   2270 ml     Assessment/Plan:  77 y.o. male is s/p:  1. Bilateral common femoral artery cutdown 2. Placement of catheter in aorta x 2 3. Aortogram 4. Repair of aorta with modular bifurcated prosthesis with one limb 5. Placement of right iliac limb 6. Radiology S&I  POD 1 -doing well.  Has ambulated.  Needs to void before d/c -BUN/Cr normal with good UOP -d/c home today. -f/u one month with endograft protocol with Dr. Jani Files, PA-C [facility] [phone] 02/16/2011 7:55 AM  Addendum  I have independently  interviewed and examined the patient, and I agree with the physician assistant's findings.  Groin incisions are clean and patient has faintly palpable PT pulses: L > R, unchanged from prior to procedure.  Hematuria not unexpected given presence of bladder tumor, for which patient is scheduled for urologic surgery next month.  Leonides Sake, MD [facility] [place] Office: [phone] Pager: [phone]  02/16/2011, 8:16 AM

## 2011-02-17 ENCOUNTER — Telehealth: Payer: Self-pay

## 2011-02-17 ENCOUNTER — Other Ambulatory Visit: Payer: Self-pay | Admitting: Vascular Surgery

## 2011-02-17 DIAGNOSIS — N390 Urinary tract infection, site not specified: Secondary | ICD-10-CM

## 2011-02-17 LAB — URINALYSIS, MICROSCOPIC ONLY
Casts: NONE SEEN
Crystals: NONE SEEN

## 2011-02-17 LAB — URINALYSIS, ROUTINE W REFLEX MICROSCOPIC
Leukocytes, UA: NEGATIVE
Nitrite: NEGATIVE
Specific Gravity, Urine: 1.017 (ref 1.005–1.030)
Urobilinogen, UA: 1 mg/dL (ref 0.0–1.0)
pH: 6.5 (ref 5.0–8.0)

## 2011-02-17 MED ORDER — CIPROFLOXACIN HCL 500 MG PO TABS: 500.0000 mg | ORAL_TABLET | Freq: Two times a day (BID) | ORAL | Status: AC

## 2011-02-17 NOTE — Telephone Encounter (Signed)
Pt. called  w/ c/o urgency, frequency, and painful urination.  Is s/p AAA repair of 12/18.  Pt. had foley catheter during procedure. Has hx of bladder tumor.  States had some chilling last evening, in addition to difficulty urinating. Also reports some redness in bilat. groin areas and a red area on penis.  States that he noted some swelling on upper thighs, bilat.  Denies swelling of feet or lower legs.  Denies SOB.  Discussed w/ Dr. Darrick Penna.  Received verbal order for pt. To submit UA for culture, and to start Cipro 500 mg BID x 5 days, and to report his symptoms to his urologist.  Advised pt. Of Dr. Darrick Penna recommendations and to monitor redness in groin and genital area, temperature, and to report worsening of symptoms to our office.  Verb. Understanding.

## 2011-02-19 LAB — URINE CULTURE: Colony Count: NO GROWTH

## 2011-02-23 ENCOUNTER — Encounter: Payer: Self-pay | Admitting: Family Medicine

## 2011-03-04 ENCOUNTER — Ambulatory Visit: Payer: Medicare Other | Admitting: Vascular Surgery

## 2011-03-17 ENCOUNTER — Encounter: Payer: Self-pay | Admitting: Vascular Surgery

## 2011-03-18 ENCOUNTER — Encounter (HOSPITAL_BASED_OUTPATIENT_CLINIC_OR_DEPARTMENT_OTHER): Payer: Self-pay | Admitting: *Deleted

## 2011-03-18 ENCOUNTER — Other Ambulatory Visit: Payer: Self-pay | Admitting: Urology

## 2011-03-18 ENCOUNTER — Ambulatory Visit (INDEPENDENT_AMBULATORY_CARE_PROVIDER_SITE_OTHER): Payer: Medicare Other | Admitting: Vascular Surgery

## 2011-03-18 ENCOUNTER — Encounter: Payer: Self-pay | Admitting: Vascular Surgery

## 2011-03-18 VITALS — BP 147/76 | HR 74 | Temp 97.9°F | Ht 72.0 in | Wt 240.0 lb

## 2011-03-18 DIAGNOSIS — I714 Abdominal aortic aneurysm, without rupture: Secondary | ICD-10-CM

## 2011-03-18 NOTE — Progress Notes (Signed)
[facility] [place]  Post-operative EVAR  History of Present Illness  [name] is a 78 y.o. male who presents post-op s/p EVAR (Date: 02/15/11).  The patient has not had back or abdominal pain.  The patient groin wound have healed.  Past Medical History, Past Surgical History, Social History, Family History, Medications, Allergies, and Review of Systems are unchanged from previous evaluation on 02/15/11.  Physical Examination  Filed Vitals:   03/18/11 0900  BP: 147/76  Pulse: 74  Temp: 97.9 F (36.6 C)   Vascular: B femoral palpable, no pedal pulse palpable (unchanged)  Gastrointestinal: soft, NTND, -G/R, - HSM, - masses, - CVAT B, no pulsatile abd mass, +ventral hernia  Medical Decision Making  [name] is a 78 y.o. male who presents s/p EVAR.  Pt is asymptomatic currently  I discussed with the patient the importance of surveillance of the endograft.  The next CT will be scheduled for 1 month.  There was some miscommunication so the s/p 1 month CT was not available for this postop appt  The patient will follow up with [place] in 1 month with this study.  Thank you for allowing [place] to participate in this patient's care.  Leonides Sake, MD [facility] [place] Office: [phone] Pager: [phone]

## 2011-03-21 ENCOUNTER — Telehealth: Payer: Self-pay | Admitting: Cardiology

## 2011-03-21 NOTE — Telephone Encounter (Signed)
New problem:  Per pam status of cardiac clearance- surgery on 1/25. Bladder tumor.

## 2011-03-21 NOTE — Telephone Encounter (Signed)
New problem:  Per pam status of clearance for bladder tumor- surgery on 1/25.

## 2011-03-21 NOTE — Telephone Encounter (Signed)
Anesthesia is requiring a cardiac clearance for [name] before his TURBT.  Surgery is scheduled for 03/25/11.  They are aware that Dr Riley Kill is off today.

## 2011-03-21 NOTE — Progress Notes (Signed)
NOTED PT POST OP 02-15-2011 AAA REPAIR VIA EVAR.  CALLED PAM AT DR Vernie Ammons OFFICE, NO CARDIOLOGIST CLEARANCE REQUESTED BECAUSE THEY WERE UNAWARE OF PT HAVING REPAIR.  REVIEWED CHART W/ DR EWELL MDA, STATES PT NEEDS CARDIAC CLEARANCE BEFORE PROCEDURE THAT IS SCHEDULED ON 03-25-11.  CALLED PAM BACK AND TOLD HER NEEDING CLEARANCE AND SHE IS LETTING DR Vernie Ammons AWARE OF AAA REPAIR.

## 2011-03-22 NOTE — Telephone Encounter (Signed)
I spoke with patient about his procedure.  He should be at fairly low risk.   We talked about options. He plans to proceed.   TS

## 2011-03-22 NOTE — Telephone Encounter (Signed)
I spoke with Pam and will fax this phone note to [phone].

## 2011-03-22 NOTE — Telephone Encounter (Signed)
[name] is under my care.  He is stable at the present time.  He recently underwent an AAA repair in the OR with Dr. Johny Drilling.  This was uncomplicated.  Below are my recommendations at that time.  I have subsequently spoken with Dr. Vernie Ammons today about the nature of the procedure.  No open procedure is anticipated, and he should be able to go home that evening.  His ASA will need to be held.  He has had prior cath many years ago, and his recent nuclear scan was read as a low risk scan with mild apical ischemia in a peri infarct area.  Below are my recommendations prior to the vascular surgery intervention.    ASSESSMENT AND PLAN: AAA (abdominal aortic aneurysm) - Shawnie Pons, MD 02/06/2011 5:55 PM Signed  He currently does not have angina, and his nuclear study is read as low risk. If he has an open repair, based on his 1992 anatomy, I would favor repeat cardiac cath given the size and scope of the procedure. He would likely have more than the multiple lesion disease he had in 1992. If a stent graft is planned, he likely can have this in the cath lab. With any symptoms, it would not obviate the ability to approach his disease invasively. Dr. Imogene Burn and I discussed this and he will let us know about the ultimate plan, but he thinks it can be done this way.  CORONARY ARTERY DISEASE - Shawnie Pons, MD 02/06/2011 5:59 PM Signed  No current angina. See the results of his nuclear scan. A small apical perfusion defect is precisely what would be expected at that time, and there is not evidence of mutlizone ischemia currently. In the absence of progressive symptoms, cath would not be indicated at this time. With an aortic stent graft, it is necessary to proceed. If surgery is needed, would favor cath. Will await final recommendation. Stent graft would not obviate an interventional approach at this point.  PAROXYSMAL ATRIAL FIBRILLATION - Shawnie Pons, MD 02/06/2011 6:01 PM Signed  He is on rate control medications.  He had some mild hematuria on Pradaxa, and has decided he would like to avoid stroke prophylaxis therapy. ASA is currently being administered on a multi use basis, CAD and others.  He is schedule for surgery on the 25th, and should be stable for this less invasive procedure.    Bonnee Quin, MD Shawnie Pons 11:36 AM 03/22/2011

## 2011-03-23 ENCOUNTER — Encounter (HOSPITAL_BASED_OUTPATIENT_CLINIC_OR_DEPARTMENT_OTHER): Payer: Self-pay | Admitting: *Deleted

## 2011-03-23 NOTE — Progress Notes (Signed)
RECEIVED OFFICE PROGRESS NOTE FROM DR Vernie Ammons WHO SPOKE W/ DR Riley Kill STATING SINCE PROCEDURE IS NOT AN OPEN PROCEDURE, NO FURTHER CARDIAC WORK-UP IS NEEDED.   SO, REVIEWED THIS WITH DR FORTUNE MDA, OK TO PROCEED.  AT THIS TIME LM FOR PT TO DO HEALTH HX.

## 2011-03-24 ENCOUNTER — Encounter (HOSPITAL_BASED_OUTPATIENT_CLINIC_OR_DEPARTMENT_OTHER): Payer: Self-pay | Admitting: *Deleted

## 2011-03-24 NOTE — Progress Notes (Signed)
NPO AFTER MN. ARRIVES AT 0615. NEEDS ISTAT. CURRENT EKG AND CXR IN EPIC. WILL TAKE SYNTHROID AM OF SURG. W/ SIP WATER.   PT HAS BILATERAL GOIN SURG. INCISION SITE'S PT STATES HAS SURG. GLUE ON IT AND CLEAN, DRY AND INTACT. NO REDDNESS.

## 2011-03-24 NOTE — H&P (Signed)
History of Present Illness            [name] is a 78 year old male patient who returns for completion of his workup of gross hematuria. He reported that about 2 months ago he had an episode of gross, painless hematuria. He attributed this to the Pradaxa which he just started taking just before the hematuria occurred. He stopped the Pradaxa and he did not seen any further gross hematuria. He has however had microscopic hematuria detected.   He does have some mild obstructive symptoms consisting of slowing of his urinary stream as well as nocturia but that's been stable for a number of years.   Organic erectile dysfunction: This is been managed with Viagra at 50 mg.  Interval history: He came in for his CT scan to evaluate the source of his hematuria and was found to have a 7.1 cm infrarenal AAA. He was not having any back pain or other symptoms. I got him into see a vascular surgeon that day. He is now scheduled for stenting of his aneurysm.   Past Medical History Problems  1. History of  Aortic Aneurysm 441.9 2. History of  Atrial Fibrillation 427.31 3. History of  Heart Disease 429.9 4. History of  Hyperlipidemia 272.4 5. History of  Hypertension 401.9 6. History of  Sleep Apnea 780.57  Surgical History Problems  1. History of  CABG (CABG) V45.81 2. History of  Hernia Repair Left 3. History of aortic aneurysm repair with endovascular tube graft  Current Meds 1. Aspirin 325 MG Oral Tablet; Therapy: (Recorded:27Nov2012) to 2. Levoxyl 137 MCG Oral Tablet; Therapy: 30Apr2012 to 3. Pravastatin Sodium 80 MG Oral Tablet; Therapy: 11Apr2012 to 4. Ramipril 5 MG Oral Capsule; Therapy: 29Nov2011 to 5. Verapamil HCl ER 240 MG Oral Tablet Extended Release; Therapy: 30Oct2012 to 6. Viagra 50 MG Oral Tablet; Therapy: (Recorded:27Nov2012) to  Allergies Medication  1. No Known Drug Allergies  Family History Problems  1. Maternal history of  Diabetes Mellitus V18.0 2. Family history of   Family Health Status Number Of Children 3: 1 son and 2 daughters 3. Family history of  Father Deceased At Age ____ 42: Heart Disease 4. Maternal history of  Heart Disease V17.49 5. Family history of  Mother Deceased At Age ____ 57: Heart Disease  Social History Problems  1. Former Smoker V15.82 Smoked 1-1.5 packs per day; Smoked for 30 years; Quit smoking in 1992 (approximately 20 years ago; Denies any other forms of tobacco use 2. Marital History - Currently Married 3. Occupation: Retired Denied  4. History of  Alcohol Use 5. History of  Caffeine Use Review of Systems Genitourinary, constitutional, skin, eye, otolaryngeal, hematologic/lymphatic, cardiovascular, pulmonary, endocrine, musculoskeletal, gastrointestinal, neurological and psychiatric system(s) were reviewed and pertinent findings if present are noted.  Genitourinary: urinary frequency, nocturia, weak urinary stream, post-void dribbling, hematuria and erectile dysfunction.  Hematologic/Lymphatic: a tendency to easily bruise.    Vitals Vital Signs  BMI Calculated: 32.51 BSA Calculated: 2.3 Height: 6 ft  Weight: 240 lb  Blood Pressure: 149 / 85, RUE, Sitting Temperature: 97 F, Oral Heart Rate: 69  Physical Exam Constitutional: Well nourished and well developed . No acute distress.  ENT:. The ears and nose are normal in appearance.  Neck: The appearance of the neck is normal and no neck mass is present.  Pulmonary: No respiratory distress and normal respiratory rhythm and effort.  Cardiovascular: Heart rate and rhythm are normal . No peripheral edema.  Abdomen: The abdomen is soft  and nontender. No masses are palpated. No CVA tenderness. No hernias are palpable. No hepatosplenomegaly noted.  Rectal: Rectal exam demonstrates normal sphincter tone, no tenderness and no masses. Estimated prostate size is 1+. The prostate has no nodularity and is not tender. The left seminal vesicle is nonpalpable. The right seminal  vesicle is nonpalpable. The perineum is normal on inspection.  Genitourinary: Examination of the penis demonstrates no discharge, no masses, no lesions and a normal meatus. The penis is uncircumcised. The scrotum is without lesions. The right epididymis is palpably normal and non-tender. The left epididymis is palpably normal and non-tender. The right testis is non-tender and without masses. The left testis is non-tender and without masses.  Lymphatics: The femoral and inguinal nodes are not enlarged or tender.  Skin: Normal skin turgor, no visible rash and no visible skin lesions.  Neuro/Psych:. Mood and affect are appropriate.    Results/Data Urine   COLOR: YELLOW  Reference Range YELLOW APPEARANCE: CLEAR  Reference Range CLEAR SPECIFIC GRAVITY: 1.025  Reference Range 1.005-1.030 pH: 6.0  Reference Range 5.0-8.0 GLUCOSE: NEG mg/dL Reference Range NEG BILIRUBIN: NEG  Reference Range NEG KETONE: NEG mg/dL Reference Range NEG BLOOD: MOD  Abnormal Reference Range NEG PROTEIN: NEG mg/dL Reference Range NEG UROBILINOGEN: 0.2 mg/dL Reference Range 1.6-1.0 NITRITE: NEG  Reference Range NEG LEUKOCYTE ESTERASE: NEG  Reference Range NEG SQUAMOUS EPITHELIAL/HPF: NONE SEEN  Reference Range RARE WBC: 0-3 WBC/hpf Reference Range <4 RBC: NONE SEEN RBC/hpf Reference Range <4 BACTERIA: NONE SEEN  Reference Range RARE CRYSTALS: NONE SEEN  Reference Range NEG CASTS: NONE SEEN  Reference Range NEG  Assessment Assessed  1. Probable  Noninvasive Papillary Transitional Cell Carcinoma Of The Bladder 233.7 2. Benign Prostatic Hypertrophy With Urinary Obstruction 600.01 3. Gross Hematuria 599.71       I went over his CT scan results with him which has revealed what appeared to be vascular calcifications associated with both kidneys. It is difficult to determine if there is a punctate stone in the left collecting system however no other abnormalities of the upper tract were noted. The area that was noted on  his CT scan and felt to be a median lobe of the prostate is in fact a median lobe recruiting into the bladder however that was not seen on the CT scan is a small, papillary bladder tumor located just over the intramural ureter on the right-hand side. No other lesions were identified within the bladder. I therefore have discussed the need for transurethral resectional biopsy of this lesion under anesthesia as an outpatient. I went over the procedure with him in detail including its risks and complications and the alternatives. We also discussed the probability of success. This certainly is something that can be performed once he has his aneurysm stented.   Plan Health Maintenance (V70.0)  1. UA With REFLEX  Done: 13Dec2012 10:13AM Noninvasive Papillary Transitional Cell Carcinoma Of The Bladder (233.7)  2. Follow-up Schedule Surgery Office  Follow-up  Requested for: 13Dec2012   1. He underwent stenting of his aneurysm. 2. Now that he is over that I will perform outpatient transurethral resection of his bladder tumor.

## 2011-03-25 ENCOUNTER — Encounter (HOSPITAL_BASED_OUTPATIENT_CLINIC_OR_DEPARTMENT_OTHER): Payer: Self-pay | Admitting: Anesthesiology

## 2011-03-25 ENCOUNTER — Encounter (HOSPITAL_BASED_OUTPATIENT_CLINIC_OR_DEPARTMENT_OTHER)
Admission: RE | Disposition: A | Discharge status: Home / Self Care | Payer: Self-pay | Source: Ambulatory Visit | Attending: Urology

## 2011-03-25 ENCOUNTER — Ambulatory Visit (HOSPITAL_BASED_OUTPATIENT_CLINIC_OR_DEPARTMENT_OTHER): Payer: Medicare Other | Admitting: Anesthesiology

## 2011-03-25 ENCOUNTER — Other Ambulatory Visit: Payer: Self-pay | Admitting: Urology

## 2011-03-25 ENCOUNTER — Encounter (HOSPITAL_BASED_OUTPATIENT_CLINIC_OR_DEPARTMENT_OTHER): Payer: Self-pay | Admitting: *Deleted

## 2011-03-25 ENCOUNTER — Ambulatory Visit (HOSPITAL_BASED_OUTPATIENT_CLINIC_OR_DEPARTMENT_OTHER)
Admission: RE | Admit: 2011-03-25 | Discharge: 2011-03-25 | Disposition: A | Discharge status: Home / Self Care | Payer: Medicare Other | Source: Ambulatory Visit | Attending: Urology | Admitting: Urology

## 2011-03-25 DIAGNOSIS — N401 Enlarged prostate with lower urinary tract symptoms: Secondary | ICD-10-CM | POA: Insufficient documentation

## 2011-03-25 DIAGNOSIS — D494 Neoplasm of unspecified behavior of bladder: Secondary | ICD-10-CM | POA: Diagnosis not present

## 2011-03-25 DIAGNOSIS — R310 Gross hematuria: Secondary | ICD-10-CM | POA: Insufficient documentation

## 2011-03-25 DIAGNOSIS — I4891 Unspecified atrial fibrillation: Secondary | ICD-10-CM | POA: Diagnosis not present

## 2011-03-25 DIAGNOSIS — N138 Other obstructive and reflux uropathy: Secondary | ICD-10-CM | POA: Insufficient documentation

## 2011-03-25 DIAGNOSIS — D303 Benign neoplasm of bladder: Secondary | ICD-10-CM | POA: Diagnosis not present

## 2011-03-25 DIAGNOSIS — D090 Carcinoma in situ of bladder: Secondary | ICD-10-CM | POA: Diagnosis not present

## 2011-03-25 DIAGNOSIS — I10 Essential (primary) hypertension: Secondary | ICD-10-CM | POA: Insufficient documentation

## 2011-03-25 DIAGNOSIS — Z951 Presence of aortocoronary bypass graft: Secondary | ICD-10-CM | POA: Diagnosis not present

## 2011-03-25 DIAGNOSIS — Z7982 Long term (current) use of aspirin: Secondary | ICD-10-CM | POA: Insufficient documentation

## 2011-03-25 DIAGNOSIS — Z79899 Other long term (current) drug therapy: Secondary | ICD-10-CM | POA: Insufficient documentation

## 2011-03-25 DIAGNOSIS — E785 Hyperlipidemia, unspecified: Secondary | ICD-10-CM | POA: Diagnosis not present

## 2011-03-25 DIAGNOSIS — D4989 Neoplasm of unspecified behavior of other specified sites: Secondary | ICD-10-CM | POA: Diagnosis not present

## 2011-03-25 DIAGNOSIS — G4730 Sleep apnea, unspecified: Secondary | ICD-10-CM | POA: Insufficient documentation

## 2011-03-25 HISTORY — PX: TRANSURETHRAL RESECTION OF BLADDER TUMOR: SHX2575

## 2011-03-25 HISTORY — DX: Presence of other vascular implants and grafts: Z95.828

## 2011-03-25 HISTORY — DX: Personal history of other diseases of the circulatory system: Z86.79

## 2011-03-25 LAB — POCT I-STAT 4, (NA,K, GLUC, HGB,HCT)
Glucose, Bld: 108 mg/dL — ABNORMAL HIGH (ref 70–99)
HCT: 41 % (ref 39.0–52.0)
Hemoglobin: 13.9 g/dL (ref 13.0–17.0)
Potassium: 4.9 mEq/L (ref 3.5–5.1)
Sodium: 141 mEq/L (ref 135–145)

## 2011-03-25 SURGERY — TURBT (TRANSURETHRAL RESECTION OF BLADDER TUMOR)
Anesthesia: General | Site: Bladder | Wound class: Clean Contaminated

## 2011-03-25 MED ORDER — SODIUM CHLORIDE 0.9 % IR SOLN
Status: DC | PRN
  Administered 2011-03-25: 6000 mL

## 2011-03-25 MED ORDER — PROMETHAZINE HCL 25 MG/ML IJ SOLN: 6.2500 mg | INTRAMUSCULAR | Status: DC | PRN

## 2011-03-25 MED ORDER — PROPOFOL 10 MG/ML IV EMUL
INTRAVENOUS | Status: DC | PRN
  Administered 2011-03-25: 150 mg via INTRAVENOUS

## 2011-03-25 MED ORDER — LIDOCAINE HCL (CARDIAC) 20 MG/ML IV SOLN
INTRAVENOUS | Status: DC | PRN
  Administered 2011-03-25: 60 mg via INTRAVENOUS

## 2011-03-25 MED ORDER — 0.9 % SODIUM CHLORIDE (POUR BTL) OPTIME
TOPICAL | Status: DC | PRN
  Administered 2011-03-25: 500 mL

## 2011-03-25 MED ORDER — PHENAZOPYRIDINE HCL 200 MG PO TABS: 200.0000 mg | ORAL_TABLET | Freq: Three times a day (TID) | ORAL | Status: AC | PRN

## 2011-03-25 MED ORDER — FENTANYL CITRATE 0.05 MG/ML IJ SOLN: 25.0000 ug | INTRAMUSCULAR | Status: DC | PRN

## 2011-03-25 MED ORDER — CIPROFLOXACIN IN D5W 200 MG/100ML IV SOLN
200.0000 mg | INTRAVENOUS | Status: AC
  Administered 2011-03-25: 200 mg via INTRAVENOUS

## 2011-03-25 MED ORDER — FENTANYL CITRATE 0.05 MG/ML IJ SOLN
INTRAMUSCULAR | Status: DC | PRN
  Administered 2011-03-25 (×2): 25 ug via INTRAVENOUS
  Administered 2011-03-25: 50 ug via INTRAVENOUS

## 2011-03-25 MED ORDER — DEXAMETHASONE SODIUM PHOSPHATE 4 MG/ML IJ SOLN
INTRAMUSCULAR | Status: DC | PRN
  Administered 2011-03-25: 4 mg via INTRAVENOUS

## 2011-03-25 MED ORDER — LACTATED RINGERS IV SOLN
INTRAVENOUS | Status: DC
  Administered 2011-03-25: 100 mL/h via INTRAVENOUS

## 2011-03-25 MED ORDER — HYDROCODONE-ACETAMINOPHEN 7.5-325 MG PO TABS: 1.0000 | ORAL_TABLET | ORAL | Status: AC | PRN

## 2011-03-25 MED ORDER — PHENAZOPYRIDINE HCL 200 MG PO TABS
200.0000 mg | ORAL_TABLET | ORAL | Status: DC | PRN
  Administered 2011-03-25: 200 mg via ORAL

## 2011-03-25 MED ORDER — ONDANSETRON HCL 4 MG/2ML IJ SOLN
INTRAMUSCULAR | Status: DC | PRN
  Administered 2011-03-25: 4 mg via INTRAVENOUS

## 2011-03-25 MED ORDER — LACTATED RINGERS IV SOLN: INTRAVENOUS | Status: DC

## 2011-03-25 SURGICAL SUPPLY — 35 items
BAG DRAIN URO-CYSTO SKYTR STRL (DRAIN) ×2 IMPLANT
BAG DRN ANRFLXCHMBR STRAP LEK (BAG)
BAG DRN UROCATH (DRAIN) ×1
BAG URINE DRAINAGE (UROLOGICAL SUPPLIES) IMPLANT
BAG URINE LEG 19OZ MD ST LTX (BAG) IMPLANT
CANISTER SUCT LVC 12 LTR MEDI- (MISCELLANEOUS) ×2 IMPLANT
CATH FOLEY 2WAY SLVR  5CC 20FR (CATHETERS)
CATH FOLEY 2WAY SLVR  5CC 22FR (CATHETERS)
CATH FOLEY 2WAY SLVR  5CC 24FR (CATHETERS) ×1
CATH FOLEY 2WAY SLVR 5CC 20FR (CATHETERS) IMPLANT
CATH FOLEY 2WAY SLVR 5CC 22FR (CATHETERS) IMPLANT
CATH FOLEY 2WAY SLVR 5CC 24FR (CATHETERS) ×1 IMPLANT
CLOTH BEACON ORANGE TIMEOUT ST (SAFETY) ×2 IMPLANT
DRAPE CAMERA CLOSED 9X96 (DRAPES) ×2 IMPLANT
ELECT BUTTON BIOP 24F 90D PLAS (MISCELLANEOUS) IMPLANT
ELECT LOOP HF 26F 30D .35MM (CUTTING LOOP) IMPLANT
ELECT LOOP MED HF 24F 12D CBL (CLIP) ×1 IMPLANT
ELECT REM PT RETURN 9FT ADLT (ELECTROSURGICAL)
ELECTRODE REM PT RTRN 9FT ADLT (ELECTROSURGICAL) ×1 IMPLANT
EVACUATOR MICROVAS BLADDER (UROLOGICAL SUPPLIES) IMPLANT
GLOVE BIO SURGEON STRL SZ8 (GLOVE) ×2 IMPLANT
GLOVE BIOGEL PI IND STRL 7.5 (GLOVE) IMPLANT
GLOVE BIOGEL PI INDICATOR 7.5 (GLOVE) ×1
GLOVE INDICATOR 6.5 STRL GRN (GLOVE) ×3 IMPLANT
GOWN PREVENTION PLUS LG XLONG (DISPOSABLE) ×2 IMPLANT
GOWN PREVENTION PLUS XLARGE (GOWN DISPOSABLE) ×1 IMPLANT
GOWN STRL NON-REIN LRG LVL3 (GOWN DISPOSABLE) ×3 IMPLANT
GOWN STRL REIN XL XLG (GOWN DISPOSABLE) ×2 IMPLANT
HOLDER FOLEY CATH W/STRAP (MISCELLANEOUS) IMPLANT
IV NS IRRIG 3000ML ARTHROMATIC (IV SOLUTION) ×2 IMPLANT
KIT ASPIRATION TUBING (SET/KITS/TRAYS/PACK) ×1 IMPLANT
LOOP CUTTING 24FR OLYMPUS (CUTTING LOOP) IMPLANT
PACK CYSTOSCOPY (CUSTOM PROCEDURE TRAY) ×2 IMPLANT
PLUG CATH AND CAP STER (CATHETERS) IMPLANT
WATER STERILE IRR 3000ML UROMA (IV SOLUTION) IMPLANT

## 2011-03-25 NOTE — Anesthesia Procedure Notes (Signed)
Procedure Name: LMA Insertion Date/Time: 03/25/2011 7:40 AM Performed by: Huel Coventry Pre-anesthesia Checklist: Patient identified, Emergency Drugs available, Suction available and Patient being monitored Patient Re-evaluated:Patient Re-evaluated prior to inductionOxygen Delivery Method: Circle System Utilized Preoxygenation: Pre-oxygenation with 100% oxygen Intubation Type: IV induction Ventilation: Mask ventilation without difficulty LMA: LMA with gastric port inserted LMA Size: 5.0 Number of attempts: 1 Placement Confirmation: positive ETCO2 Tube secured with: Tape Dental Injury: Teeth and Oropharynx as per pre-operative assessment

## 2011-03-25 NOTE — Anesthesia Postprocedure Evaluation (Signed)
  Anesthesia Post-op Note  Patient: [name]  Procedure(s) Performed:  TRANSURETHRAL RESECTION OF BLADDER TUMOR (TURBT)  Patient Location: PACU  Anesthesia Type: General  Level of Consciousness: awake and alert   Airway and Oxygen Therapy: Patient Spontanous Breathing  Post-op Pain: mild  Post-op Assessment: Post-op Vital signs reviewed, Patient's Cardiovascular Status Stable, Respiratory Function Stable, Patent Airway and No signs of Nausea or vomiting  Post-op Vital Signs: stable  Complications: No apparent anesthesia complications

## 2011-03-25 NOTE — Addendum Note (Signed)
Addendum  created 03/25/11 0851 by Meloney Feld L Dnyla Antonetti, MD   Modules edited:Anesthesia Responsible Staff    

## 2011-03-25 NOTE — Transfer of Care (Signed)
Immediate Anesthesia Transfer of Care Note  Patient: [name]  Procedure(s) Performed:  TRANSURETHRAL RESECTION OF BLADDER TUMOR (TURBT)  Patient Location: PACU  Anesthesia Type: General  Level of Consciousness: awake, alert  and oriented  Airway & Oxygen Therapy: Patient Spontanous Breathing and Patient connected to face mask oxygen  Post-op Assessment: Report given to PACU RN and Post -op Vital signs reviewed and stable  Post vital signs: Reviewed and stable  Complications: No apparent anesthesia complications

## 2011-03-25 NOTE — OR Nursing (Signed)
Dentures returned.

## 2011-03-25 NOTE — Op Note (Signed)
PATIENT:  [name]  PRE-OPERATIVE DIAGNOSIS: Bladder tumor  POST-OPERATIVE DIAGNOSIS: Same  PROCEDURE:  Procedure(s): TRANSURETHRAL RESECTION OF BLADDER TUMOR (TURBT) (0.6cm.)  SURGEON:  Surgeon(s): Garnett Farm  ANESTHESIA:   General  EBL:  Minimal  DRAINS: none  SPECIMEN:  Source of Specimen:  Bladder tumor  DISPOSITION OF SPECIMEN:  PATHOLOGY  Indication: [name] is a 78 year old male patient who was seen initially for gross hematuria. He had been Pradaxa and after this was stopped he saw no further hematuria. A CT scan was obtained to evaluate his upper tracts which were found to be free of lesion however a 7 cm AAA was identified which required more immediate attention. This was stented. I discover the source of his hematuria at the time of cystoscopy. A solitary papillary tumor was visualized within the bladder located just over the intramural ureter on the right-hand side. He is therefore brought to the operating room today for transurethral resection of his bladder tumor. Dr. Tedra Senegal, his cardiologist, felt that he would be of an acceptable risk for this form of surgery.  Description of operation: The patient was taken to the operating room and administered general anesthesia. He was then placed on the table and moved to the dorsal lithotomy position after which his genitalia was sterilely prepped and draped. An official timeout was then performed.  The urethral meatus was dilated with Sissy Hoff sounds to 30 [place]. The 26 French resectoscope with Timberlake obturator was then introduced into the bladder and the obturator was removed. The resectoscope element with 12 lens was then inserted and the bladder was fully and systematically inspected. Ureteral orifices were noted to be in the normal anatomic positions. He had a very large median lobe but minimal lateral lobe hypertrophy noted. The tumor was solitary and papillary in configuration. Was located just over the  right ureteral orifice but not involving the orifice itself.  I first began by resecting the tumor. I used pure cut as the tumor resection required by resecting the right ureteral orifice. Once the tumor was fully resected I fulgurated the edges of the mucosa but did not fulgurate anywhere near the opening of the right ureter into the bladder. Urine could be seen effluxing from this ureteral opening.  Reinspection of the bladder revealed all obvious tumor had been fully resected and there was no evidence of perforation. The Microvasive evacuator was then used to irrigate the bladder and remove all of the portions of bladder tumor which were sent to pathology. I then removed the resectoscope.  PLAN OF CARE: Discharge to home after PACU  PATIENT DISPOSITION:  PACU - hemodynamically stable.

## 2011-03-25 NOTE — Interval H&P Note (Signed)
History and Physical Interval Note:  03/25/2011 7:25 AM  [name]  has presented today for surgery, with the diagnosis of Bladder Tumor  The various methods of treatment have been discussed with the patient and family. After consideration of risks, benefits and other options for treatment, the patient has consented to  Procedure(s): TRANSURETHRAL RESECTION OF BLADDER TUMOR (TURBT) as a surgical intervention .  The patients' history has been reviewed, patient examined, no change in status, stable for surgery.  I have reviewed the patients' chart and labs.  Questions were answered to the patient's satisfaction.     Garnett Farm

## 2011-03-25 NOTE — Addendum Note (Signed)
Addendum  created 03/25/11 4098 by Gaetano Hawthorne, MD   Modules edited:Anesthesia Responsible Staff

## 2011-03-25 NOTE — Anesthesia Preprocedure Evaluation (Addendum)
Anesthesia Evaluation  Patient identified by MRN, date of birth, ID band Patient awake    Reviewed: Allergy & Precautions, H&P , NPO status , Patient's Chart, lab work & pertinent test results  Airway Mallampati: III TM Distance: >3 FB Neck ROM: full    Dental No notable dental hx. (+) Lower Dentures and Upper Dentures   Pulmonary sleep apnea ,  Mild sleep apnea clear to auscultation  Pulmonary exam normal       Cardiovascular Exercise Tolerance: Good hypertension, + CAD and + CABG + dysrhythmias Atrial Fibrillation regular Normal AAA repair several months ago.  Has clearance from cardiologist.  A cardiolite in 2012 was good.   Neuro/Psych Negative Neurological ROS  Negative Psych ROS   GI/Hepatic negative GI ROS, Neg liver ROS, hiatal hernia,   Endo/Other  Negative Endocrine ROSHypothyroidism   Renal/GU negative Renal ROS  Genitourinary negative   Musculoskeletal   Abdominal   Peds  Hematology negative hematology ROS (+)   Anesthesia Other Findings   Reproductive/Obstetrics negative OB ROS                       Anesthesia Physical Anesthesia Plan  ASA: III  Anesthesia Plan: General   Post-op Pain Management:    Induction: Intravenous  Airway Management Planned: LMA  Additional Equipment:   Intra-op Plan:   Post-operative Plan:   Informed Consent: I have reviewed the patients History and Physical, chart, labs and discussed the procedure including the risks, benefits and alternatives for the proposed anesthesia with the patient or authorized representative who has indicated his/her understanding and acceptance.   Dental Advisory Given  Plan Discussed with: CRNA and Surgeon  Anesthesia Plan Comments:        Anesthesia Quick Evaluation

## 2011-03-28 ENCOUNTER — Encounter (HOSPITAL_BASED_OUTPATIENT_CLINIC_OR_DEPARTMENT_OTHER): Payer: Self-pay | Admitting: Urology

## 2011-04-04 DIAGNOSIS — D090 Carcinoma in situ of bladder: Secondary | ICD-10-CM | POA: Diagnosis not present

## 2011-04-26 ENCOUNTER — Other Ambulatory Visit: Payer: Self-pay | Admitting: Vascular Surgery

## 2011-04-26 DIAGNOSIS — I714 Abdominal aortic aneurysm, without rupture: Secondary | ICD-10-CM | POA: Diagnosis not present

## 2011-04-29 ENCOUNTER — Ambulatory Visit
Admission: RE | Admit: 2011-04-29 | Discharge: 2011-04-29 | Disposition: A | Discharge status: Home / Self Care | Payer: Medicare Other | Source: Ambulatory Visit | Attending: Vascular Surgery | Admitting: Vascular Surgery

## 2011-04-29 ENCOUNTER — Encounter: Payer: Self-pay | Admitting: Vascular Surgery

## 2011-04-29 ENCOUNTER — Ambulatory Visit (INDEPENDENT_AMBULATORY_CARE_PROVIDER_SITE_OTHER): Payer: Medicare Other | Admitting: Vascular Surgery

## 2011-04-29 VITALS — BP 170/80 | HR 86 | Resp 20 | Ht 72.0 in | Wt 246.0 lb

## 2011-04-29 DIAGNOSIS — I719 Aortic aneurysm of unspecified site, without rupture: Secondary | ICD-10-CM | POA: Diagnosis not present

## 2011-04-29 DIAGNOSIS — I714 Abdominal aortic aneurysm, without rupture, unspecified: Secondary | ICD-10-CM

## 2011-04-29 DIAGNOSIS — K449 Diaphragmatic hernia without obstruction or gangrene: Secondary | ICD-10-CM | POA: Diagnosis not present

## 2011-04-29 DIAGNOSIS — I739 Peripheral vascular disease, unspecified: Secondary | ICD-10-CM | POA: Insufficient documentation

## 2011-04-29 MED ORDER — IOHEXOL 350 MG/ML SOLN
100.0000 mL | Freq: Once | INTRAVENOUS | Status: AC | PRN
  Administered 2011-04-29: 100 mL via INTRAVENOUS

## 2011-04-29 NOTE — Progress Notes (Signed)
[facility]  Post-operative EVAR  History of Present Illness  [name] is a 78 y.o. male who presents post-op s/p EVAR (Date: 02/15/11).  Most recent CTA (Date: 04/29/10) demonstrates: no endoleak and no change in sac size.  The patient has had no back or abdominal pain.  Physical Examination  Filed Vitals:   04/29/11 1152  BP: 170/80  Pulse: 86  Resp: 20   Vascular: palpable femoral pulses, well healing inguinal incisions  Gastrointestinal: soft, NTND, -G/R, - HSM, - masses, - CVAT B, no palpable pulsatile mass  Non-Invasive Vascular Imaging  CTA Abd & pelvis (Date: 04/29/10) 1. Interval bifurcated infrarenal aortic stent graft placement without endoleak or other apparent complication.  2. Stable 7.5 cm diameter native aneurysm sac.   Medical Decision Making  [name] is a 78 y.o. male who presents s/p successful EVAR.  Pt is asymptomatic with no change sac size.  No change in sac size is expected so soon after repair.  I discussed with the patient the importance of surveillance of the endograft.  The next endograft duplex will be scheduled for 3 months.  The next CT will be scheduled for 12 months.  The patient will follow up with [place] in 3 months with these studies.  Thank you for allowing [place] to participate in this patient's care.  Leonides Sake, MD [facility] Office: [phone] Pager: [phone]

## 2011-05-05 ENCOUNTER — Ambulatory Visit (INDEPENDENT_AMBULATORY_CARE_PROVIDER_SITE_OTHER): Payer: Medicare Other | Admitting: Cardiology

## 2011-05-05 ENCOUNTER — Encounter: Payer: Self-pay | Admitting: Cardiology

## 2011-05-05 VITALS — BP 142/68 | HR 67 | Ht 72.0 in | Wt 216.1 lb

## 2011-05-05 DIAGNOSIS — I2510 Atherosclerotic heart disease of native coronary artery without angina pectoris: Secondary | ICD-10-CM | POA: Diagnosis not present

## 2011-05-05 DIAGNOSIS — I4891 Unspecified atrial fibrillation: Secondary | ICD-10-CM

## 2011-05-05 DIAGNOSIS — R319 Hematuria, unspecified: Secondary | ICD-10-CM

## 2011-05-05 DIAGNOSIS — I714 Abdominal aortic aneurysm, without rupture: Secondary | ICD-10-CM | POA: Diagnosis not present

## 2011-05-05 NOTE — Assessment & Plan Note (Signed)
Appears more of permanent atrial fib.  We have discussed anticoagulation options, and he has opted for ASA alone.  Currently without symptoms, and rate is well controlled.  He will read more about anticoagulation, and let me know if he changes his mind.

## 2011-05-05 NOTE — Assessment & Plan Note (Signed)
Sp stent graft, without endoleak.

## 2011-05-05 NOTE — Progress Notes (Signed)
HPI:  He is stable.  He had a urologic procedure and also has had a repeat CT with no endo leak.  We discussed anticoagulation today in some detail.  He has chosen to avoid, but we did talk options for him.  Denies any chest pain.    Current Outpatient Prescriptions  Medication Sig Dispense Refill  . aspirin EC 325 MG tablet Take 325 mg by mouth every other day. One tablet by mouth every other day      . levothyroxine (LEVOXYL) 137 MCG tablet Take 137 mcg by mouth every morning.      . pravastatin (PRAVACHOL) 80 MG tablet Take 80 mg by mouth every evening.      . ramipril (ALTACE) 5 MG capsule Take 5 mg by mouth every morning.      . sildenafil (VIAGRA) 50 MG tablet Take 50 mg by mouth daily as needed. For erectile dysfunction.      . verapamil (COVERA HS) 240 MG (CO) 24 hr tablet Take 1 tablet (240 mg total) by mouth at bedtime.  90 tablet  3    Allergies  Allergen Reactions  . Pradaxa     Blood in urine    Past Medical History  Diagnosis Date  . Special screening for malignant neoplasm of prostate   . Hyperglycemia diet controlled  . Obesity   . Hypothyroidism   . Hyperlipidemia   . Organic impotence   . Hypertension   . Ventricular ectopy     mild  . Paroxysmal atrial fibrillation   . Hiatal hernia     repaired in 1992  . Bladder tumor   . Hematuria bladder tumor ---  followed by dr Vernie Ammons  . S/P CABG x 7 1992  . Coronary artery disease s/p cabg x7  1992    CARDIOLOGIST- DR Riley Kill-  VISIT NOTE 12-09-202 IN EPIC  . S/P AAA repair using bifurcation graft   . Chronic kidney disease   . Sleep apnea no rx cpap    mild- tested  2012    Past Surgical History  Procedure Date  . Coronary artery bypass graft 05-1990    x7   . Cardiac catheterization 03-1990    Positive  . Ett 10/31/00    WNL  . Hospital - afib 03/28/02  . Endovascular stent insertion 02/15/2011    Procedure: ENDOVASCULAR STENT GRAFT INSERTION;  Surgeon: Nilda Simmer, MD;  Location: [facility] OR;   Service: Vascular;  Laterality: N/A;  Insertion of endovascular stent graft   . Cardiovascular stress test 02-01-2011  NUCLEAR NO EXERCISE    LOW RISK STUDY.  SMALL APICAL DEFECT. NO EVIDENCE OF MULTIZONE ISCHEMIA.  . Inguinal hernia repair 1997    left  . Transurethral resection of bladder tumor 03/25/2011    Procedure: TRANSURETHRAL RESECTION OF BLADDER TUMOR (TURBT);  Surgeon: Garnett Farm, MD;  Location: [facility];  Service: Urology;  Laterality: N/A;    Family History  Problem Relation Age of Onset  . Obesity Mother 26    deceased  . Hypertension Mother   . Diabetes Mother   . Alcohol abuse Sister 66    deceased  . Other Father     deceased unknown  . Colon cancer Neg Hx   . Prostate cancer Neg Hx   . Anesthesia problems Neg Hx     History   Social History  . Marital Status: Married    Spouse Name: N/A    Number of Children: N/A  .  Years of Education: N/A   Occupational History  . retired - Energy manager.     2001. Likes to play golf . Travels with motor home   Social History Main Topics  . Smoking status: Former Smoker -- 30 years    Types: Cigarettes    Quit date: 02/28/1990  . Smokeless tobacco: Never Used  . Alcohol Use: Yes     rare  . Drug Use: No  . Sexually Active: Not on file   Other Topics Concern  . Not on file   Social History Narrative   From [place].Married, 2nd wife had lung CA 2009Divorced, remarried, lives with wife, 3 children (1st marriage)Likes to play golf, travels with motor home.Prev 4 sport athlete in HS, football fan.     ROS: Please see the HPI.  All other systems reviewed and negative.  PHYSICAL EXAM:  BP 142/68  Pulse 67  Ht 6' (1.829 m)  Wt 216 lb 1.9 oz (98.031 kg)  BMI 29.31 kg/m2  General: Well developed, well nourished, in no acute distress. Head:  Normocephalic and atraumatic. Neck: no JVD Lungs: Clear to auscultation and percussion. Heart: irregularly, irregular without  murmur. Pulses: Pulses normal in all 4 extremities. Extremities: No clubbing or cyanosis. No edema. Neurologic: Alert and oriented x 3.  EKG: Atrial fibrillation with controlled ventricular response.     ASSESSMENT AND PLAN:

## 2011-05-05 NOTE — Assessment & Plan Note (Signed)
No chest pain or recurrent symptoms.

## 2011-05-05 NOTE — Patient Instructions (Signed)
Your physician recommends that you schedule a follow-up appointment in: 3 MONTHS  Your physician recommends that you continue on your current medications as directed. Please refer to the Current Medication list given to you today.   

## 2011-05-05 NOTE — Assessment & Plan Note (Signed)
Has seen urology and had treatment.

## 2011-06-04 ENCOUNTER — Other Ambulatory Visit: Payer: Self-pay | Admitting: Cardiology

## 2011-06-27 ENCOUNTER — Telehealth: Payer: Self-pay | Admitting: *Deleted

## 2011-06-27 NOTE — Telephone Encounter (Signed)
Pharmacy says Levoxyl is not available from the manufacturer. Is it okay to use generic?

## 2011-06-28 NOTE — Telephone Encounter (Signed)
Faxed back to pharmacy

## 2011-06-28 NOTE — Telephone Encounter (Signed)
Yes, please change to generic, same sig, same dose.  Please send in 90d supply with 3 rf.

## 2011-07-26 DIAGNOSIS — D090 Carcinoma in situ of bladder: Secondary | ICD-10-CM | POA: Diagnosis not present

## 2011-07-26 DIAGNOSIS — N281 Cyst of kidney, acquired: Secondary | ICD-10-CM | POA: Diagnosis not present

## 2011-07-26 DIAGNOSIS — N401 Enlarged prostate with lower urinary tract symptoms: Secondary | ICD-10-CM | POA: Diagnosis not present

## 2011-07-29 ENCOUNTER — Encounter: Payer: Self-pay | Admitting: Family Medicine

## 2011-07-29 DIAGNOSIS — C679 Malignant neoplasm of bladder, unspecified: Secondary | ICD-10-CM | POA: Insufficient documentation

## 2011-08-02 DIAGNOSIS — H01009 Unspecified blepharitis unspecified eye, unspecified eyelid: Secondary | ICD-10-CM | POA: Diagnosis not present

## 2011-08-02 DIAGNOSIS — H25019 Cortical age-related cataract, unspecified eye: Secondary | ICD-10-CM | POA: Diagnosis not present

## 2011-08-04 ENCOUNTER — Encounter: Payer: Self-pay | Admitting: Neurosurgery

## 2011-08-05 ENCOUNTER — Ambulatory Visit (INDEPENDENT_AMBULATORY_CARE_PROVIDER_SITE_OTHER): Payer: Medicare Other | Admitting: Vascular Surgery

## 2011-08-05 ENCOUNTER — Ambulatory Visit: Payer: Medicare Other | Admitting: Vascular Surgery

## 2011-08-05 ENCOUNTER — Encounter: Payer: Self-pay | Admitting: Neurosurgery

## 2011-08-05 ENCOUNTER — Ambulatory Visit (INDEPENDENT_AMBULATORY_CARE_PROVIDER_SITE_OTHER): Payer: Medicare Other | Admitting: Neurosurgery

## 2011-08-05 VITALS — BP 157/89 | HR 66 | Resp 12 | Ht 72.0 in | Wt 241.2 lb

## 2011-08-05 DIAGNOSIS — I714 Abdominal aortic aneurysm, without rupture, unspecified: Secondary | ICD-10-CM

## 2011-08-05 DIAGNOSIS — Z48812 Encounter for surgical aftercare following surgery on the circulatory system: Secondary | ICD-10-CM

## 2011-08-05 NOTE — Progress Notes (Signed)
[facility] [place] HISTORY AND PHYSICAL   CC: AAA duplex status post EVAR December 2012 Referring Physician: Imogene Burn  History of Present Illness: 78 year old male patient of Dr. Imogene Burn who underwent an EVAR procedure in December of 2012 for a large AAA. Patient reports no postsurgical problems, he has no abdominal or back pain. Patient also denies any new medical diagnoses or recent surgeries.  Past Medical History  Diagnosis Date  . Special screening for malignant neoplasm of prostate   . Hyperglycemia diet controlled  . Obesity   . Hypothyroidism   . Hyperlipidemia   . Organic impotence   . Hypertension   . Ventricular ectopy     mild  . Paroxysmal atrial fibrillation   . Hiatal hernia     repaired in 1992  . Bladder tumor   . Hematuria bladder tumor ---  followed by Dr Vernie Ammons  . S/P CABG x 7 1992  . Coronary artery disease s/p cabg x7  1992    CARDIOLOGIST- DR Riley Kill-  VISIT NOTE 12-09-202 IN EPIC  . S/P AAA repair using bifurcation graft   . Chronic kidney disease   . Sleep apnea no rx cpap    mild- tested  2012  . AAA (abdominal aortic aneurysm) 04/29/11    ROS: [x]  Positive   [ ]  Denies    General: [ ]  Weight loss, [ ]  Fever, [ ]  chills Neurologic: [ ]  Dizziness, [ ]  Blackouts, [ ]  Seizure [ ]  Stroke, [ ]  "Mini stroke", [ ]  Slurred speech, [ ]  Temporary blindness; [ ]  weakness in arms or legs, [ ]  Hoarseness Cardiac: [ ]  Chest pain/pressure, [ ]  Shortness of breath at rest [ ]  Shortness of breath with exertion, [ ]  Atrial fibrillation or irregular heartbeat Vascular: [ ]  Pain in legs with walking, [ ]  Pain in legs at rest, [ ]  Pain in legs at night,  [ ]  Non-healing ulcer, [ ]  Blood clot in vein/DVT,   Pulmonary: [ ]  Home oxygen, [ ]  Productive cough, [ ]  Coughing up blood, [ ]  Asthma,  [ ]  Wheezing Musculoskeletal:  [ ]  Arthritis, [ ]  Low back pain, [ ]  Joint pain Hematologic: [ ]  Easy Bruising, [ ]  Anemia; [ ]  Hepatitis Gastrointestinal: [ ]   Blood in stool, [ ]  Gastroesophageal Reflux/heartburn, [ ]  Trouble swallowing Urinary: [ ]  chronic Kidney disease, [ ]  on HD - [ ]  MWF or [ ]  TTHS, [ ]  Burning with urination, [ ]  Difficulty urinating Skin: [ ]  Rashes, [ ]  Wounds Psychological: [ ]  Anxiety, [ ]  Depression   Social History History  Substance Use Topics  . Smoking status: Former Smoker -- 30 years    Types: Cigarettes    Quit date: 02/28/1990  . Smokeless tobacco: Never Used  . Alcohol Use: Yes     rare    Family History Family History  Problem Relation Age of Onset  . Obesity Mother 68    deceased  . Hypertension Mother   . Diabetes Mother   . Heart disease Mother     Heart Disease before age 2  . Hyperlipidemia Mother   . Alcohol abuse Sister 54    deceased  . Other Father     deceased unknown  . Colon cancer Neg Hx   . Prostate cancer Neg Hx   . Anesthesia problems Neg Hx     Allergies  Allergen Reactions  . Dabigatran Etexilate Mesylate     Blood in urine  Current Outpatient Prescriptions  Medication Sig Dispense Refill  . aspirin EC 325 MG tablet Take 325 mg by mouth every other day. One tablet by mouth every other day      . levothyroxine (LEVOXYL) 137 MCG tablet Take 137 mcg by mouth every morning.      . pravastatin (PRAVACHOL) 80 MG tablet Take 80 mg by mouth every evening.      . pravastatin (PRAVACHOL) 80 MG tablet TAKE 1 TABLET BY MOUTH ONCE A DAY  90 tablet  3  . ramipril (ALTACE) 5 MG capsule Take 5 mg by mouth every morning.      . sildenafil (VIAGRA) 50 MG tablet Take 50 mg by mouth daily as needed. For erectile dysfunction.      . verapamil (COVERA HS) 240 MG (CO) 24 hr tablet Take 1 tablet (240 mg total) by mouth at bedtime.  90 tablet  3    Physical Examination  Filed Vitals:   08/05/11 1001  BP: 157/89  Pulse: 66  Resp: 12    Body mass index is 32.71 kg/(m^2).  General:  WDWN in NAD Gait: Normal HEENT: WNL Eyes: Pupils equal Pulmonary: normal non-labored  breathing , without Rales, rhonchi,  wheezing Cardiac: RRR, without  Murmurs, rubs or gallops; No carotid bruits Abdomen: soft, NT, no masses Skin: no rashes, ulcers noted Vascular Exam/Pulses: Patient has 2+ femoral pulses bilaterally, 2+ radial pulses bilaterally, no abdominal mass palpated  Extremities without ischemic changes, no Gangrene , no cellulitis; no open wounds;  Musculoskeletal: no muscle wasting or atrophy  Neurologic: A&O X 3; Appropriate Affect ; SENSATION: normal; MOTOR FUNCTION:  moving all extremities equally. Speech is fluent/normal  Non-Invasive Vascular Imaging: Duplex today shows a sac size of 6.7x 7.8 with no endoleak, previous was 7.5 via CT in March 2012  ASSESSMENT/PLAN: The patient will return in 3 months for repeat duplex of the AAA, we will have a CT this coming December. The patient understands the need for this surveillance, he is in agreement with this plan.  Lauree Chandler ANP  Clinic M.D.: Imogene Burn

## 2011-08-05 NOTE — Progress Notes (Signed)
Addended by: Sharee Pimple on: 08/05/2011 11:31 AM   Modules accepted: Orders

## 2011-08-05 NOTE — Progress Notes (Signed)
AAA EVAR duplex performed @ [facility] 08/05/2011

## 2011-08-12 NOTE — Procedures (Unsigned)
VASCULAR LAB EXAM  INDICATION:  Follow up AAA endograft placed 02/15/2011.  HISTORY: Diabetes:  No. Cardiac:  No. Hypertension:  Yes.  EXAM:  AAA SAC SIZE:  6.7 cm AP, 7.8 cm TRV  PREVIOUS SAC SIZE:  CT dated 04/29/2010:  7.5 cm AP, __________ cm TRV   IMPRESSION: 1. The aorta and endograft appear patent. 2. No significant change in the size of the aneurysm sac surrounding     the endograft. 3. No evidence of endoleak was detected.   ___________________________________________ Fransisco Hertz, MD  SH/MEDQ  D:  08/05/2011  T:  08/05/2011  Job:  [id number]

## 2011-08-17 ENCOUNTER — Ambulatory Visit (INDEPENDENT_AMBULATORY_CARE_PROVIDER_SITE_OTHER): Payer: Medicare Other | Admitting: Cardiology

## 2011-08-17 ENCOUNTER — Encounter: Payer: Self-pay | Admitting: Cardiology

## 2011-08-17 VITALS — BP 130/88 | HR 70 | Ht 72.0 in | Wt 241.0 lb

## 2011-08-17 DIAGNOSIS — I2510 Atherosclerotic heart disease of native coronary artery without angina pectoris: Secondary | ICD-10-CM | POA: Diagnosis not present

## 2011-08-17 DIAGNOSIS — I714 Abdominal aortic aneurysm, without rupture, unspecified: Secondary | ICD-10-CM

## 2011-08-17 DIAGNOSIS — I4891 Unspecified atrial fibrillation: Secondary | ICD-10-CM

## 2011-08-17 NOTE — Assessment & Plan Note (Signed)
SP stent graft.  No endo leak on CT

## 2011-08-17 NOTE — Assessment & Plan Note (Signed)
No current symptoms.  Chart reviewed.

## 2011-08-17 NOTE — Assessment & Plan Note (Addendum)
Permanent at this point.  Rate controlled.  Options reviewed.  No exertional symptoms.  I cannot appreciate TR on exam.  Has opted against antithrombin therapy.  Remains on ASA.

## 2011-08-17 NOTE — Progress Notes (Signed)
HPI: He was doing well. He denies any major complaints. Prolonged discussion again about atrial fibrillation. He was refused anticoagulation therapy which is documented in the chart. He remains on aspirin and would like to continue this.  Current Outpatient Prescriptions  Medication Sig Dispense Refill  . aspirin EC 325 MG tablet Take 325 mg by mouth every other day. One tablet by mouth every other day      . levothyroxine (LEVOXYL) 137 MCG tablet Take 137 mcg by mouth every morning.      . pravastatin (PRAVACHOL) 80 MG tablet TAKE 1 TABLET BY MOUTH ONCE A DAY  90 tablet  3  . ramipril (ALTACE) 5 MG capsule Take 5 mg by mouth every morning.      . sildenafil (VIAGRA) 50 MG tablet Take 50 mg by mouth daily as needed. For erectile dysfunction.      . verapamil (COVERA HS) 240 MG (CO) 24 hr tablet Take 1 tablet (240 mg total) by mouth at bedtime.  90 tablet  3  . DISCONTD: pravastatin (PRAVACHOL) 80 MG tablet Take 80 mg by mouth every evening.        Allergies  Allergen Reactions  . Dabigatran Etexilate Mesylate     Blood in urine    Past Medical History  Diagnosis Date  . Special screening for malignant neoplasm of prostate   . Hyperglycemia diet controlled  . Obesity   . Hypothyroidism   . Hyperlipidemia   . Organic impotence   . Hypertension   . Ventricular ectopy     mild  . Paroxysmal atrial fibrillation   . Hiatal hernia     repaired in 1992  . Bladder tumor   . Hematuria bladder tumor ---  followed by Dr Vernie Ammons  . S/P CABG x 7 1992  . Coronary artery disease s/p cabg x7  1992    CARDIOLOGIST- DR Riley Kill-  VISIT NOTE 12-09-202 IN EPIC  . S/P AAA repair using bifurcation graft   . Chronic kidney disease   . Sleep apnea no rx cpap    mild- tested  2012  . AAA (abdominal aortic aneurysm) 04/29/11    Past Surgical History  Procedure Date  . Coronary artery bypass graft 05-1990    x7   . Cardiac catheterization 03-1990    Positive  . Ett 10/31/00    WNL  . Hospital  - afib 03/28/02  . Endovascular stent insertion 02/15/2011    Procedure: ENDOVASCULAR STENT GRAFT INSERTION;  Surgeon: Nilda Simmer, MD;  Location: [facility] OR;  Service: Vascular;  Laterality: N/A;  Insertion of endovascular stent graft   . Cardiovascular stress test 02-01-2011  NUCLEAR NO EXERCISE    LOW RISK STUDY.  SMALL APICAL DEFECT. NO EVIDENCE OF MULTIZONE ISCHEMIA.  . Inguinal hernia repair 1997    left  . Transurethral resection of bladder tumor 03/25/2011    Procedure: TRANSURETHRAL RESECTION OF BLADDER TUMOR (TURBT);  Surgeon: Garnett Farm, MD;  Location: [facility];  Service: Urology;  Laterality: N/A;    Family History  Problem Relation Age of Onset  . Obesity Mother 19    deceased  . Hypertension Mother   . Diabetes Mother   . Heart disease Mother     Heart Disease before age 32  . Hyperlipidemia Mother   . Alcohol abuse Sister 19    deceased  . Other Father     deceased unknown  . Colon cancer Neg Hx   . Prostate cancer  Neg Hx   . Anesthesia problems Neg Hx     History   Social History  . Marital Status: Married    Spouse Name: N/A    Number of Children: N/A  . Years of Education: N/A   Occupational History  . retired - Energy manager.     2001. Likes to play golf . Travels with motor home   Social History Main Topics  . Smoking status: Former Smoker -- 30 years    Types: Cigarettes    Quit date: 02/28/1990  . Smokeless tobacco: Never Used  . Alcohol Use: Yes     rare  . Drug Use: No  . Sexually Active: Not on file   Other Topics Concern  . Not on file   Social History Narrative   From [place].Married, 2nd wife had lung CA 2009Divorced, remarried, lives with wife, 3 children (1st marriage)Likes to play golf, travels with motor home.Prev 4 sport athlete in HS, football fan.     ROS: Please see the HPI.  All other systems reviewed and negative.  PHYSICAL EXAM:  BP 130/88  Pulse 70  Ht 6' (1.829 m)  Wt 241 lb  (109.317 kg)  BMI 32.69 kg/m2  General: Well developed, well nourished, in no acute distress. Head:  Normocephalic and atraumatic. Neck: no JVD Lungs: Clear to auscultation and percussion. Heart: irregularly irregular rhythm, no murmur.  Pulses: Pulses normal in all 4 extremities. Extremities: No clubbing or cyanosis. No edema. Neurologic: Alert and oriented x 3.  EKG:  Atrial fib and controlled VR.  Nonspecific ST and T abnormality ASSESSMENT AND PLAN:

## 2011-08-17 NOTE — Patient Instructions (Signed)
Your physician wants you to follow-up in: 6 MONTHS with Dr Stuckey.  You will receive a reminder letter in the mail two months in advance. If you don't receive a letter, please call our office to schedule the follow-up appointment.  Your physician recommends that you continue on your current medications as directed. Please refer to the Current Medication list given to you today.  

## 2011-11-03 ENCOUNTER — Encounter: Payer: Self-pay | Admitting: Neurosurgery

## 2011-11-04 ENCOUNTER — Other Ambulatory Visit (INDEPENDENT_AMBULATORY_CARE_PROVIDER_SITE_OTHER): Payer: Medicare Other | Admitting: *Deleted

## 2011-11-04 ENCOUNTER — Encounter: Payer: Self-pay | Admitting: Neurosurgery

## 2011-11-04 ENCOUNTER — Ambulatory Visit (INDEPENDENT_AMBULATORY_CARE_PROVIDER_SITE_OTHER): Payer: Medicare Other | Admitting: Neurosurgery

## 2011-11-04 VITALS — BP 137/74 | HR 65 | Ht 72.0 in | Wt 239.0 lb

## 2011-11-04 DIAGNOSIS — Z48812 Encounter for surgical aftercare following surgery on the circulatory system: Secondary | ICD-10-CM

## 2011-11-04 DIAGNOSIS — I714 Abdominal aortic aneurysm, without rupture: Secondary | ICD-10-CM

## 2011-11-04 NOTE — Addendum Note (Signed)
Addended by: Sharee Pimple on: 11/04/2011 01:55 PM   Modules accepted: Orders

## 2011-11-04 NOTE — Progress Notes (Signed)
[facility] [place] AAA/PAD/PVD Office Note  CC: Three-month AAA surveillance Referring Physician: Imogene Burn  History of Present Illness: 78 year old male patient of Dr. Imogene Burn followed status post endovascular repair of AAA in December 2012. The patient denies any unusual abdominal or back pain and states he is overall doing well.  Past Medical History  Diagnosis Date  . Special screening for malignant neoplasm of prostate   . Hyperglycemia diet controlled  . Obesity   . Hypothyroidism   . Hyperlipidemia   . Organic impotence   . Hypertension   . Ventricular ectopy     mild  . Paroxysmal atrial fibrillation   . Hiatal hernia     repaired in 1992  . Bladder tumor   . Hematuria bladder tumor ---  followed by Dr Vernie Ammons  . S/P CABG x 7 1992  . Coronary artery disease s/p cabg x7  1992    CARDIOLOGIST- DR Riley Kill-  VISIT NOTE 12-09-202 IN EPIC  . S/P AAA repair using bifurcation graft   . Chronic kidney disease   . Sleep apnea no rx cpap    mild- tested  2012  . AAA (abdominal aortic aneurysm) 04/29/11    ROS: [x]  Positive   [ ]  Denies    General: [ ]  Weight loss, [ ]  Fever, [ ]  chills Neurologic: [ ]  Dizziness, [ ]  Blackouts, [ ]  Seizure [ ]  Stroke, [ ]  "Mini stroke", [ ]  Slurred speech, [ ]  Temporary blindness; [ ]  weakness in arms or legs, [ ]  Hoarseness Cardiac: [ ]  Chest pain/pressure, [ ]  Shortness of breath at rest [ ]  Shortness of breath with exertion, [ ]  Atrial fibrillation or irregular heartbeat Vascular: [ ]  Pain in legs with walking, [ ]  Pain in legs at rest, [ ]  Pain in legs at night,  [ ]  Non-healing ulcer, [ ]  Blood clot in vein/DVT,   Pulmonary: [ ]  Home oxygen, [ ]  Productive cough, [ ]  Coughing up blood, [ ]  Asthma,  [ ]  Wheezing Musculoskeletal:  [ ]  Arthritis, [ ]  Low back pain, [ ]  Joint pain Hematologic: [ ]  Easy Bruising, [ ]  Anemia; [ ]  Hepatitis Gastrointestinal: [ ]  Blood in stool, [ ]  Gastroesophageal Reflux/heartburn, [ ]  Trouble  swallowing Urinary: [ ]  chronic Kidney disease, [ ]  on HD - [ ]  MWF or [ ]  TTHS, [ ]  Burning with urination, [ ]  Difficulty urinating Skin: [ ]  Rashes, [ ]  Wounds Psychological: [ ]  Anxiety, [ ]  Depression   Social History History  Substance Use Topics  . Smoking status: Former Smoker -- 30 years    Types: Cigarettes    Quit date: 02/28/1990  . Smokeless tobacco: Never Used  . Alcohol Use: Yes     rare    Family History Family History  Problem Relation Age of Onset  . Obesity Mother 42    deceased  . Hypertension Mother   . Diabetes Mother   . Heart disease Mother     Heart Disease before age 11  . Hyperlipidemia Mother   . Alcohol abuse Sister 48    deceased  . Other Father     deceased unknown  . Colon cancer Neg Hx   . Prostate cancer Neg Hx   . Anesthesia problems Neg Hx     Allergies  Allergen Reactions  . Dabigatran Etexilate Mesylate     Blood in urine    Current Outpatient Prescriptions  Medication Sig Dispense Refill  . aspirin EC 325 MG  tablet Take 325 mg by mouth every other day. One tablet by mouth every other day      . levothyroxine (LEVOXYL) 137 MCG tablet Take 137 mcg by mouth every morning.      . pravastatin (PRAVACHOL) 80 MG tablet TAKE 1 TABLET BY MOUTH ONCE A DAY  90 tablet  3  . ramipril (ALTACE) 5 MG capsule Take 5 mg by mouth every morning.      . sildenafil (VIAGRA) 50 MG tablet Take 50 mg by mouth daily as needed. For erectile dysfunction.      . verapamil (COVERA HS) 240 MG (CO) 24 hr tablet Take 1 tablet (240 mg total) by mouth at bedtime.  90 tablet  3    Physical Examination  Filed Vitals:   11/04/11 1032  BP: 137/74  Pulse: 65    Body mass index is 32.41 kg/(m^2).  General:  WDWN in NAD Gait: Normal HEENT: WNL Eyes: Pupils equal Pulmonary: normal non-labored breathing , without Rales, rhonchi,  wheezing Cardiac: RRR, without  Murmurs, rubs or gallops; No carotid bruits Abdomen: soft, NT, no masses Skin: no rashes,  ulcers noted Vascular Exam/Pulses: Palpable femoral pulses bilaterally, there is no abdominal mass palpated due to girth  Extremities without ischemic changes, no Gangrene , no cellulitis; no open wounds;  Musculoskeletal: no muscle wasting or atrophy  Neurologic: A&O X 3; Appropriate Affect ; SENSATION: normal; MOTOR FUNCTION:  moving all extremities equally. Speech is fluent/normal  Non-Invasive Vascular Imaging: AAA duplex today shows a maximum diameter of 6.6 x 7.0 which is virtually unchanged from previous when he was 6.7 x 7.8 in June 2013.  ASSESSMENT/PLAN: Asymptomatic patient is 9 months status post AAA repair without any evidence of endoleak. The patient will get a CT scan of the abdomen pelvis in December and return to see Dr. Imogene Burn. The patient questions were encouraged and answered, he is in agreement with this plan.  Lauree Chandler ANP   M.D.: Imogene Burn

## 2011-11-22 ENCOUNTER — Encounter: Payer: Self-pay | Admitting: Gastroenterology

## 2011-12-17 ENCOUNTER — Other Ambulatory Visit: Payer: Self-pay | Admitting: Family Medicine

## 2011-12-21 ENCOUNTER — Other Ambulatory Visit: Payer: Self-pay | Admitting: Family Medicine

## 2011-12-21 DIAGNOSIS — E7800 Pure hypercholesterolemia, unspecified: Secondary | ICD-10-CM

## 2011-12-21 DIAGNOSIS — E039 Hypothyroidism, unspecified: Secondary | ICD-10-CM

## 2011-12-22 ENCOUNTER — Other Ambulatory Visit: Payer: Self-pay | Admitting: Family Medicine

## 2011-12-22 ENCOUNTER — Other Ambulatory Visit (INDEPENDENT_AMBULATORY_CARE_PROVIDER_SITE_OTHER): Payer: Medicare Other

## 2011-12-22 DIAGNOSIS — E7800 Pure hypercholesterolemia, unspecified: Secondary | ICD-10-CM

## 2011-12-22 DIAGNOSIS — E039 Hypothyroidism, unspecified: Secondary | ICD-10-CM

## 2011-12-23 LAB — COMPREHENSIVE METABOLIC PANEL
ALT: 17 U/L (ref 0–53)
AST: 22 U/L (ref 0–37)
Albumin: 4.1 g/dL (ref 3.5–5.2)
Alkaline Phosphatase: 61 U/L (ref 39–117)
BUN: 20 mg/dL (ref 6–23)
CO2: 17 mEq/L — ABNORMAL LOW (ref 19–32)
Calcium: 9.3 mg/dL (ref 8.4–10.5)
Chloride: 111 mEq/L (ref 96–112)
Creatinine, Ser: 1.1 mg/dL (ref 0.4–1.5)
GFR: 67.09 mL/min (ref >60.00)
Glucose, Bld: 66 mg/dL — ABNORMAL LOW (ref 70–99)
Potassium: 4.3 mEq/L (ref 3.5–5.1)
Sodium: 146 mEq/L — ABNORMAL HIGH (ref 135–145)
Total Bilirubin: 0.8 mg/dL (ref 0.3–1.2)
Total Protein: 8.3 g/dL (ref 6.0–8.3)

## 2011-12-23 LAB — TSH: TSH: 1.33 u[IU]/mL (ref 0.35–5.50)

## 2011-12-23 LAB — LIPID PANEL
Cholesterol: 191 mg/dL (ref 0–200)
Triglycerides: 212 mg/dL — ABNORMAL HIGH (ref 0.0–149.0)

## 2011-12-29 ENCOUNTER — Ambulatory Visit (INDEPENDENT_AMBULATORY_CARE_PROVIDER_SITE_OTHER): Payer: Medicare Other | Admitting: Family Medicine

## 2011-12-29 ENCOUNTER — Encounter: Payer: Self-pay | Admitting: Family Medicine

## 2011-12-29 VITALS — BP 136/80 | HR 73 | Temp 97.7°F | Wt 241.0 lb

## 2011-12-29 DIAGNOSIS — Z0000 Encounter for general adult medical examination without abnormal findings: Secondary | ICD-10-CM

## 2011-12-29 DIAGNOSIS — Z23 Encounter for immunization: Secondary | ICD-10-CM

## 2011-12-29 DIAGNOSIS — I714 Abdominal aortic aneurysm, without rupture: Secondary | ICD-10-CM | POA: Diagnosis not present

## 2011-12-29 DIAGNOSIS — E039 Hypothyroidism, unspecified: Secondary | ICD-10-CM

## 2011-12-29 DIAGNOSIS — E7800 Pure hypercholesterolemia, unspecified: Secondary | ICD-10-CM

## 2011-12-29 DIAGNOSIS — I10 Essential (primary) hypertension: Secondary | ICD-10-CM

## 2011-12-29 DIAGNOSIS — I4891 Unspecified atrial fibrillation: Secondary | ICD-10-CM | POA: Diagnosis not present

## 2011-12-29 DIAGNOSIS — C679 Malignant neoplasm of bladder, unspecified: Secondary | ICD-10-CM | POA: Diagnosis not present

## 2011-12-29 DIAGNOSIS — E785 Hyperlipidemia, unspecified: Secondary | ICD-10-CM

## 2011-12-29 NOTE — Patient Instructions (Addendum)
Ask urology about checking a PSA.   Call about your colonoscopy.  Work on [employer], recheck lipids in 3 months- fasting.  We'll contact you with your lab report. Take care.  Glad to see you.

## 2011-12-30 DIAGNOSIS — Z0000 Encounter for general adult medical examination without abnormal findings: Secondary | ICD-10-CM | POA: Insufficient documentation

## 2011-12-30 NOTE — Assessment & Plan Note (Signed)
S/p stent w/ vasc f/u.

## 2011-12-30 NOTE — Assessment & Plan Note (Signed)
Rate controlled, on ASA and feeling well.

## 2011-12-30 NOTE — Assessment & Plan Note (Signed)
Continue meds, he'll work on diet and recheck lipids in a few months.

## 2011-12-30 NOTE — Assessment & Plan Note (Signed)
Controlled, continue replacement.  No TMG on exam.

## 2011-12-30 NOTE — Assessment & Plan Note (Signed)
Per uro.  No dysuria.

## 2011-12-30 NOTE — Assessment & Plan Note (Signed)
See scanned forms.  Routine anticipatory guidance given to patient.  See health maintenance. Tetanus 2008 Flu 2013 Shingles 2012 [id number] Colonoscopy- pt to call about f/u.   Prostate cancer screening per uro- I've asked pt to check on this with uro Advance directive d/w pt  Has a living will. Wife designated.

## 2011-12-30 NOTE — Progress Notes (Signed)
I have personally reviewed the Medicare Annual Wellness questionnaire and have noted 1. The patient's medical and social history 2. Their use of alcohol, tobacco or illicit drugs 3. Their current medications and supplements 4. The patient's functional ability including ADL's, fall risks, home safety risks and hearing or visual             impairment. 5. Diet and physical activities 6. Evidence for depression or mood disorders  The patients weight, height, BMI have been recorded in the chart and visual acuity is per eye clinic.  I have made referrals, counseling and provided education to the patient based review of the above and I have provided the pt with a written personalized care plan for preventive services.  See scanned forms.  Routine anticipatory guidance given to patient.  See health maintenance. Tetanus 2008 Flu 2013 Shingles 2012 [id number] Colonoscopy- pt to call about f/u.   Prostate cancer screening per uro- I've asked pt to check on this with uro Advance directive d/w pt  Has a living will. Wife designated.    AAA stented prev, w/o abd pain and has vasc f/u.   H/o bladder CA, sp resection and doing well w/o dysuria.    Hypothyroid.  No neck mass, dysphagia, ant neck pain  Hypertension:    Using medication without problems or lightheadedness: y Chest pain with exertion:n Edema:n Short of breath:n  Elevated Cholesterol: Using medications without problems:y Muscle aches: n Diet compliance: usually, but not recently. "It's my fault the numbers are up." Exercise: some  ED controlled with meds, no ADE, tolerated well.  Good effect.   PMH and SH reviewed  Meds, vitals, and allergies reviewed.   ROS: See HPI.  Otherwise negative.    GEN: nad, alert and oriented HEENT: mucous membranes moist NECK: supple w/o LA CV: IRR not tachy PULM: ctab, no inc wob ABD: soft, +bs EXT: no edema SKIN: no acute rash

## 2011-12-30 NOTE — Assessment & Plan Note (Signed)
Continue meds, controlled, work on diet.

## 2012-01-31 DIAGNOSIS — D090 Carcinoma in situ of bladder: Secondary | ICD-10-CM | POA: Diagnosis not present

## 2012-02-06 ENCOUNTER — Other Ambulatory Visit: Payer: Self-pay | Admitting: Neurosurgery

## 2012-02-06 DIAGNOSIS — I714 Abdominal aortic aneurysm, without rupture: Secondary | ICD-10-CM | POA: Diagnosis not present

## 2012-02-06 LAB — CREATININE, SERUM: Creat: 0.98 mg/dL (ref 0.50–1.35)

## 2012-02-09 ENCOUNTER — Encounter: Payer: Self-pay | Admitting: Vascular Surgery

## 2012-02-10 ENCOUNTER — Ambulatory Visit
Admission: RE | Admit: 2012-02-10 | Discharge: 2012-02-10 | Disposition: A | Discharge status: Home / Self Care | Payer: Medicare Other | Source: Ambulatory Visit | Attending: Neurosurgery | Admitting: Neurosurgery

## 2012-02-10 ENCOUNTER — Encounter: Payer: Self-pay | Admitting: Vascular Surgery

## 2012-02-10 ENCOUNTER — Ambulatory Visit (INDEPENDENT_AMBULATORY_CARE_PROVIDER_SITE_OTHER): Payer: Medicare Other | Admitting: Vascular Surgery

## 2012-02-10 VITALS — BP 154/85 | HR 57 | Ht 72.0 in | Wt 246.5 lb

## 2012-02-10 DIAGNOSIS — Z48812 Encounter for surgical aftercare following surgery on the circulatory system: Secondary | ICD-10-CM

## 2012-02-10 DIAGNOSIS — I714 Abdominal aortic aneurysm, without rupture, unspecified: Secondary | ICD-10-CM

## 2012-02-10 MED ORDER — IOHEXOL 350 MG/ML SOLN
80.0000 mL | Freq: Once | INTRAVENOUS | Status: AC | PRN
  Administered 2012-02-10: 80 mL via INTRAVENOUS

## 2012-02-10 NOTE — Progress Notes (Signed)
[facility]   Established EVAR  History of Present Illness  [name] is a 78 y.o. (12-07-1933) male who presents for routine follow up s/p EVAR (Date: 02/15/11).  Most recent EVAR duplex (Date: 08/05/11) demonstrates: no endoleak and no sac size change. Most recent CT (Date: 02/10/12) demonstrates: no endoleak and smaller sac size.  The patient has not had back or abdominal pain.  Past Medical History, Past Surgical History, Social History, Family History, Medications, Allergies, and Review of Systems are unchanged from previous evaluation on 08/05/11.  Physical Examination  Filed Vitals:   02/10/12 1204  BP: 154/85  Pulse: 57  Height: 6' (1.829 m)  Weight: 246 lb 8 oz (111.812 kg)  SpO2: 98%   Body mass index is 33.43 kg/(m^2).  General: A&O x 3, WDWN  Eyes: PERRLA, EOMI  Pulmonary: Sym exp, good air movt, CTAB, no rales, rhonchi, & wheezing  Cardiac: RRR, Nl S1, S2, no Murmurs, rubs or gallops  Vascular: Vessel Right Left  Radial Palpable Palpable  Ulnar Palpable Palpable  Brachial Palpable Palpable  Carotid Palpable, without bruit Palpable, without bruit  Aorta Not palpable N/A  Femoral Palpable Palpable  Popliteal Not palpable Not palpable  PT Not Palpable Not Palpable  DP Not Palpable Not Palpable   Gastrointestinal: soft, NTND, -G/R, - HSM, - masses, - CVAT B  Musculoskeletal: M/S 5/5 throughout , Extremities without ischemic changes   Neurologic: Pain and light touch intact in extremities , Motor exam as listed above  CTA Abd/Pelvis (Date: 02/10/12) Official read on available yet.  Based on my review of the CTA, no endoleak or migration, continue successful exclusion and minor sac shrink 0.5 cm-1 cm  Medical Decision Making  [name] is a 78 y.o. male who presents s/p EVAR.  Pt is asymptomatic with decreasing sac size.  I discussed with the patient the importance of surveillance of the endograft.  The next  endograft duplex will be scheduled for 6 months.  The next CT will be scheduled for 12 months.  The patient will follow up with [place] in 6 months with these studies.  Thank you for allowing [place] to participate in this patient's care.  Leonides Sake, MD [facility] [place] Office: [phone] Pager: [phone]  02/10/2012, 12:25 PM

## 2012-02-10 NOTE — Addendum Note (Signed)
Addended by: Sharee Pimple on: 02/10/2012 03:24 PM   Modules accepted: Orders

## 2012-02-14 ENCOUNTER — Encounter: Payer: Self-pay | Admitting: Cardiology

## 2012-02-14 ENCOUNTER — Ambulatory Visit (INDEPENDENT_AMBULATORY_CARE_PROVIDER_SITE_OTHER): Payer: Medicare Other | Admitting: Cardiology

## 2012-02-14 VITALS — BP 130/64 | HR 75 | Ht 75.0 in | Wt 243.0 lb

## 2012-02-14 DIAGNOSIS — I4821 Permanent atrial fibrillation: Secondary | ICD-10-CM

## 2012-02-14 DIAGNOSIS — I2510 Atherosclerotic heart disease of native coronary artery without angina pectoris: Secondary | ICD-10-CM | POA: Diagnosis not present

## 2012-02-14 DIAGNOSIS — I4891 Unspecified atrial fibrillation: Secondary | ICD-10-CM | POA: Diagnosis not present

## 2012-02-14 DIAGNOSIS — I714 Abdominal aortic aneurysm, without rupture, unspecified: Secondary | ICD-10-CM

## 2012-02-14 DIAGNOSIS — E785 Hyperlipidemia, unspecified: Secondary | ICD-10-CM | POA: Diagnosis not present

## 2012-02-14 DIAGNOSIS — C679 Malignant neoplasm of bladder, unspecified: Secondary | ICD-10-CM

## 2012-02-14 NOTE — Assessment & Plan Note (Signed)
Remains on ASA.  We have discussed and will discuss with Urology.  He has not favored anything more than ASA.

## 2012-02-14 NOTE — Patient Instructions (Addendum)
Your physician recommends that you schedule a follow-up appointment in: 3 MONTHS with Dr Stuckey  Your physician recommends that you continue on your current medications as directed. Please refer to the Current Medication list given to you today.  

## 2012-02-14 NOTE — Assessment & Plan Note (Signed)
Prior SG

## 2012-02-14 NOTE — Assessment & Plan Note (Signed)
Will discuss with Dr. Vernie Ammons risk of anticoagulation for him.

## 2012-02-14 NOTE — Progress Notes (Signed)
HPI:  The patient is doing well.  We had a long discussion today regarding possible anticoagulation which he has resisted because he developed hematuria on Pradaxa.  He got a clean bill of health from his urologist. He also saw [facility].  Of note, he had a little pain in his ankle which extended to his foot, but has resolved.  No chest pain.    Current Outpatient Prescriptions  Medication Sig Dispense Refill  . aspirin EC 325 MG tablet Take 325 mg by mouth every other day. One tablet by mouth every other day      . levothyroxine (LEVOXYL) 137 MCG tablet Take 137 mcg by mouth every morning.      . pravastatin (PRAVACHOL) 80 MG tablet TAKE 1 TABLET BY MOUTH ONCE A DAY  90 tablet  3  . ramipril (ALTACE) 5 MG capsule TAKE 1 CAPSULE BY MOUTH DAILY  90 capsule  3  . sildenafil (VIAGRA) 50 MG tablet Take 50 mg by mouth daily as needed. For erectile dysfunction.      . verapamil (CALAN-SR) 240 MG CR tablet TAKE 1 TABLET BY MOUTH AT BEDTIME  90 tablet  3    Allergies  Allergen Reactions  . Dabigatran Etexilate Mesylate     Blood in urine    Past Medical History  Diagnosis Date  . Special screening for malignant neoplasm of prostate   . Hyperglycemia diet controlled  . Obesity   . Hypothyroidism   . Hyperlipidemia   . Organic impotence   . Hypertension   . Ventricular ectopy     mild  . Paroxysmal atrial fibrillation   . Hiatal hernia     repaired in 1992  . Bladder tumor   . Hematuria bladder tumor ---  followed by Dr Vernie Ammons  . S/P CABG x 7 1992  . Coronary artery disease s/p cabg x7  1992    CARDIOLOGIST- DR Riley Kill-  VISIT NOTE 12-09-202 IN EPIC  . S/P AAA repair using bifurcation graft   . Chronic kidney disease   . Sleep apnea no rx cpap    mild- tested  2012  . AAA (abdominal aortic aneurysm) 04/29/11    Past Surgical History  Procedure Date  . Coronary artery bypass graft 05-1990    x7   . Cardiac catheterization 03-1990    Positive  . Ett 10/31/00    WNL  . Hospital -  afib 03/28/02  . Endovascular stent insertion 02/15/2011    Procedure: ENDOVASCULAR STENT GRAFT INSERTION;  Surgeon: Nilda Simmer, MD;  Location: [facility] OR;  Service: Vascular;  Laterality: N/A;  Insertion of endovascular stent graft   . Cardiovascular stress test 02-01-2011  NUCLEAR NO EXERCISE    LOW RISK STUDY.  SMALL APICAL DEFECT. NO EVIDENCE OF MULTIZONE ISCHEMIA.  . Inguinal hernia repair 1997    left  . Transurethral resection of bladder tumor 03/25/2011    Procedure: TRANSURETHRAL RESECTION OF BLADDER TUMOR (TURBT);  Surgeon: Garnett Farm, MD;  Location: [facility];  Service: Urology;  Laterality: N/A;    Family History  Problem Relation Age of Onset  . Obesity Mother 58    deceased  . Hypertension Mother   . Diabetes Mother   . Heart disease Mother     Heart Disease before age 44  . Hyperlipidemia Mother   . Alcohol abuse Sister 73    deceased  . Other Father     deceased unknown  . Colon cancer Neg Hx   .  Prostate cancer Neg Hx   . Anesthesia problems Neg Hx     History   Social History  . Marital Status: Married    Spouse Name: N/A    Number of Children: N/A  . Years of Education: N/A   Occupational History  . retired - Energy manager.     2001. Likes to play golf . Travels with motor home   Social History Main Topics  . Smoking status: Former Smoker -- 30 years    Types: Cigarettes    Quit date: 02/28/1990  . Smokeless tobacco: Never Used  . Alcohol Use: Yes     Comment: rare  . Drug Use: No  . Sexually Active: Not on file   Other Topics Concern  . Not on file   Social History Narrative   From [place], remarried, lives with wife, 3 children (1st marriage)Likes to play golf, travels with motor home.Prev 4 sport athlete in HS, football fan.     ROS: Please see the HPI.  All other systems reviewed and negative.  PHYSICAL EXAM:  BP 130/64  Pulse 75  Ht 6\' 3"  (1.905 m)  Wt 243 lb (110.224 kg)  BMI 30.37  kg/m2  SpO2 99%  General: Well developed, well nourished, in no acute distress. Head:  Normocephalic and atraumatic. Neck: no JVD Lungs: Clear to auscultation and percussion. Heart: Normal S1 and S2.  Irregularly irregular Abdomen:  Normal bowel sounds; soft; non tender; no organomegaly Pulses: Pulses normal in all 4 extremities.  R PT excellent with good refill.  Extremities: No clubbing or cyanosis. No edema. Neurologic: Alert and oriented x 3.  EKG:  Atrial fib.  Left axis deviation.  Nonspecific ST and T changes.  No acute changes.   ASSESSMENT AND PLAN:

## 2012-02-14 NOTE — Assessment & Plan Note (Signed)
Managed by Dr. Para March.  Patient going back in three months for re-discussion after weight loss

## 2012-02-14 NOTE — Assessment & Plan Note (Signed)
No chest pain

## 2012-03-09 ENCOUNTER — Other Ambulatory Visit: Payer: Self-pay | Admitting: Family Medicine

## 2012-03-09 NOTE — Telephone Encounter (Signed)
Electronic refill request.  Please advise. 

## 2012-03-10 NOTE — Telephone Encounter (Signed)
Sent!

## 2012-03-19 ENCOUNTER — Telehealth: Payer: Self-pay | Admitting: Vascular Surgery

## 2012-03-19 NOTE — Telephone Encounter (Addendum)
Message copied by Shari Prows on Mon Mar 19, 2012  3:28 PM ------      Message from: Sara Chu      Created: Mon Mar 19, 2012  3:11 PM      Regarding: Incorrect Lab       Drinda Butts - Could you reschedule the Aorto-Iliac you have scheduled to a 30 minute Aorta (AAA). Per discharge sheet the u/s is for EVAR which would be a 30 min AAA. The order is correct, they will always show iliac in the order.   I changed the vascular lab study to 30 mins. I did not notify the patient because the appt time/date was not affected. awt

## 2012-03-27 ENCOUNTER — Telehealth: Payer: Self-pay

## 2012-03-27 MED ORDER — APIXABAN 5 MG PO TABS: 5.0000 mg | ORAL_TABLET | Freq: Two times a day (BID) | ORAL | Status: DC

## 2012-03-27 NOTE — Telephone Encounter (Signed)
I left a message for the [name] to call back.  Please let the [name] know that I did receive his personal letter and Thank you.  Also, Dr Riley Kill did speak with Dr Vernie Ammons and the [name] can start Eliquis 5mg  two times a day.  When the [name] starts this medication he should STOP Aspirin and follow-up with Dr Riley Kill in 1 week for an appointment.

## 2012-03-27 NOTE — Telephone Encounter (Signed)
I spoke with the pt and about a week ago the pt noticed a few spots of blood in the front of his underwear.  The pt also noticed blood in his underwear about a month ago.  The pt denies any blood in his urine or bowel movements.  The pt would like to make Dr Riley Kill aware of this information before he picks up Eliquis from the pharmacy. I will forward this message to Dr Riley Kill.

## 2012-03-30 NOTE — Telephone Encounter (Signed)
Follow up call   Patient call back to speak with nurse Leotis Shames

## 2012-03-30 NOTE — Telephone Encounter (Signed)
I spoke with the pt and made him aware that I have spoken with Dr Riley Kill about the pt having blood in his underwear. Dr Riley Kill is going to review the pt's chart before making a decision about starting Eliquis.  At this time the pt will not pick-up Rx from from the pharmacy.

## 2012-04-16 NOTE — Telephone Encounter (Signed)
Per Dr Riley Kill the pt can start Eliquis at a lower dose. Eliquis 2.5mg  twice a day for 1 month and then increase to 5mg  twice a day if the pt remains stable.  The pt should stop ASA when starting Eliquis. The pt should contact our office with any issues.

## 2012-04-20 MED ORDER — APIXABAN 2.5 MG PO TABS: 2.5000 mg | ORAL_TABLET | Freq: Two times a day (BID) | ORAL | Status: DC

## 2012-04-20 NOTE — Telephone Encounter (Signed)
I spoke with the pt and sent in Rx for Eliquis 2.5mg  bid. The pt verbalized understanding to stop ASA when starting Eliquis. The pt is scheduled to see Dr Riley Kill 05/17/12 and we will decide at that time if the pt can increase to 5mg  bid. The pt is aware to contact our office with any questions or concerns.  The pt is aware to monitor for abnormal bleeding.

## 2012-05-17 ENCOUNTER — Encounter: Payer: Self-pay | Admitting: Cardiology

## 2012-05-17 ENCOUNTER — Ambulatory Visit (INDEPENDENT_AMBULATORY_CARE_PROVIDER_SITE_OTHER): Payer: Medicare Other | Admitting: Cardiology

## 2012-05-17 VITALS — BP 152/77 | HR 65 | Ht 72.0 in | Wt 247.0 lb

## 2012-05-17 DIAGNOSIS — E039 Hypothyroidism, unspecified: Secondary | ICD-10-CM

## 2012-05-17 DIAGNOSIS — E785 Hyperlipidemia, unspecified: Secondary | ICD-10-CM

## 2012-05-17 DIAGNOSIS — I10 Essential (primary) hypertension: Secondary | ICD-10-CM

## 2012-05-17 DIAGNOSIS — I2510 Atherosclerotic heart disease of native coronary artery without angina pectoris: Secondary | ICD-10-CM | POA: Diagnosis not present

## 2012-05-17 DIAGNOSIS — I4821 Permanent atrial fibrillation: Secondary | ICD-10-CM

## 2012-05-17 DIAGNOSIS — I4891 Unspecified atrial fibrillation: Secondary | ICD-10-CM

## 2012-05-17 LAB — BASIC METABOLIC PANEL
CO2: 27 mEq/L (ref 19–32)
Calcium: 9.2 mg/dL (ref 8.4–10.5)
GFR: 76.29 mL/min (ref >60.00)
Sodium: 138 mEq/L (ref 135–145)

## 2012-05-17 LAB — CBC
MCV: 89.6 fl (ref 78.0–100.0)
Platelets: 265 10*3/uL (ref 150.0–400.0)
RBC: 4.79 Mil/uL (ref 4.22–5.81)

## 2012-05-17 MED ORDER — APIXABAN 5 MG PO TABS: 5.0000 mg | ORAL_TABLET | Freq: Two times a day (BID) | ORAL | Status: DC

## 2012-05-17 NOTE — Patient Instructions (Addendum)
Your physician recommends that you have lab work today: BMP and CBC  Your physician has recommended you make the following change in your medication: INCREASE Eliquis to 5mg  take one by mouth twice a day  Your physician has requested that you regularly monitor and record your blood pressure readings at home. Please use the same machine at the same time of day to check your readings and record them to bring to your follow-up visit. Please call the office in a few weeks with your BP readings.   Your physician recommends that you schedule a follow-up appointment in: June with Dr Shirlee Latch (previous pt of Dr Riley Kill)

## 2012-05-17 NOTE — Progress Notes (Signed)
HPI:  He is doing well.  Tolerating new meds.  No bleeding.  Situation reviewed.     Current Outpatient Prescriptions  Medication Sig Dispense Refill  . apixaban (ELIQUIS) 2.5 MG TABS tablet Take 1 tablet (2.5 mg total) by mouth 2 (two) times daily.  60 tablet  0  . levothyroxine (LEVOXYL) 137 MCG tablet Take 137 mcg by mouth every morning.      . pravastatin (PRAVACHOL) 80 MG tablet TAKE 1 TABLET BY MOUTH ONCE A DAY  90 tablet  3  . ramipril (ALTACE) 5 MG capsule TAKE 1 CAPSULE BY MOUTH DAILY  90 capsule  3  . verapamil (CALAN-SR) 240 MG CR tablet TAKE 1 TABLET BY MOUTH AT BEDTIME  90 tablet  3  . VIAGRA 50 MG tablet TAKE 1 TABLET BY MOUTH DAILY AS NEEDED  10 tablet  12   No current facility-administered medications for this visit.    Allergies  Allergen Reactions  . Dabigatran Etexilate Mesylate     Blood in urine    Past Medical History  Diagnosis Date  . Special screening for malignant neoplasm of prostate   . Hyperglycemia diet controlled  . Obesity   . Hypothyroidism   . Hyperlipidemia   . Organic impotence   . Hypertension   . Ventricular ectopy     mild  . Paroxysmal atrial fibrillation   . Hiatal hernia     repaired in 1992  . Bladder tumor   . Hematuria bladder tumor ---  followed by Dr Vernie Ammons  . S/P CABG x 7 1992  . Coronary artery disease s/p cabg x7  1992    CARDIOLOGIST- DR Riley Kill-  VISIT NOTE 12-09-202 IN EPIC  . S/P AAA repair using bifurcation graft   . Chronic kidney disease   . Sleep apnea no rx cpap    mild- tested  2012  . AAA (abdominal aortic aneurysm) 04/29/11    Past Surgical History  Procedure Laterality Date  . Coronary artery bypass graft  05-1990    x7   . Cardiac catheterization  03-1990    Positive  . Ett  10/31/00    WNL  . Hospital - afib  03/28/02  . Endovascular stent insertion  02/15/2011    Procedure: ENDOVASCULAR STENT GRAFT INSERTION;  Surgeon: Nilda Simmer, MD;  Location: [facility] OR;  Service: Vascular;  Laterality:  N/A;  Insertion of endovascular stent graft   . Cardiovascular stress test  02-01-2011  NUCLEAR NO EXERCISE    LOW RISK STUDY.  SMALL APICAL DEFECT. NO EVIDENCE OF MULTIZONE ISCHEMIA.  . Inguinal hernia repair  1997    left  . Transurethral resection of bladder tumor  03/25/2011    Procedure: TRANSURETHRAL RESECTION OF BLADDER TUMOR (TURBT);  Surgeon: Garnett Farm, MD;  Location[facility];  Service: Urology;  Laterality: N/A;    Family History  Problem Relation Age of Onset  . Obesity Mother 31    deceased  . Hypertension Mother   . Diabetes Mother   . Heart disease Mother     Heart Disease before age 50  . Hyperlipidemia Mother   . Alcohol abuse Sister 47    deceased  . Other Father     deceased unknown  . Colon cancer Neg Hx   . Prostate cancer Neg Hx   . Anesthesia problems Neg Hx     History   Social History  . Marital Status: Married    Spouse Name: N/A  Number of Children: N/A  . Years of Education: N/A   Occupational History  . retired - Energy manager.     2001. Likes to play golf . Travels with motor home   Social History Main Topics  . Smoking status: Former Smoker -- 30 years    Types: Cigarettes    Quit date: 02/28/1990  . Smokeless tobacco: Never Used  . Alcohol Use: Yes     Comment: rare  . Drug Use: No  . Sexually Active: Not on file   Other Topics Concern  . Not on file   Social History Narrative   From [place]   Divorced, remarried, lives with wife, 3 children (1st marriage)   Likes to play golf, travels with motor home.   Prev 4 sport athlete in HS, football fan.     ROS: Please see the HPI.  All other systems reviewed and negative.  PHYSICAL EXAM:  BP 152/77  Pulse 65  Ht 6' (1.829 m)  Wt 247 lb (112.038 kg)  BMI 33.49 kg/m2  General: Well developed, well nourished, in no acute distress. Head:  Normocephalic and atraumatic. Neck: no JVD Lungs: Clear to auscultation and percussion. Heart:  irregularly irregularly.  No def murmur.   Abdomen:  Normal bowel sounds; soft; non tender; no organomegaly.  Cannot feel expansion Pulses: Pulses normal in all 4 extremities. Extremities: No clubbing or cyanosis. No edema. Neurologic: Alert and oriented x 3.  EKG:  Atrial fib.  Left axis deviation.    ASSESSMENT AND PLAN:  1.  Atrial fib controlled VR.  Will increase apixaban to 5mg  BID since tolerating 2.  Continuing FU with [facility] for AAA 3.  HTN  Not well controlled.    Needs to check numbers at home.

## 2012-05-20 NOTE — Assessment & Plan Note (Signed)
Remote PCI by me, prior to [facility].  Currently without symptoms.  Continue to monitor.

## 2012-05-20 NOTE — Assessment & Plan Note (Signed)
Last TSH is at target.

## 2012-05-20 NOTE — Assessment & Plan Note (Signed)
Have asked him to measure home BPs and let us know.

## 2012-05-20 NOTE — Assessment & Plan Note (Signed)
He is currently getting Eliquis.  Tolerating well.  Had bladder cancer identified previously, then AAA.  Tolerating well and risks of treatment and no treatment, as well as bleeding, covered with patient.  Will increase dose to therapeutic range.  He will follow

## 2012-05-20 NOTE — Assessment & Plan Note (Signed)
Not at target but tolerating medication currently. Under the care of his primary MD, Dr. Para March.

## 2012-06-04 ENCOUNTER — Telehealth: Payer: Self-pay | Admitting: Cardiology

## 2012-06-04 NOTE — Telephone Encounter (Signed)
Walk in pt Form " Blood Pressure" reports Dropped Off Sent to  Lauren 06/04/12/KM

## 2012-06-06 ENCOUNTER — Other Ambulatory Visit: Payer: Self-pay | Admitting: Family Medicine

## 2012-06-12 ENCOUNTER — Other Ambulatory Visit: Payer: Self-pay | Admitting: Family Medicine

## 2012-08-14 ENCOUNTER — Ambulatory Visit: Payer: Medicare Other | Admitting: Cardiology

## 2012-08-15 ENCOUNTER — Ambulatory Visit: Payer: Medicare Other | Admitting: Cardiology

## 2012-08-24 ENCOUNTER — Ambulatory Visit: Payer: Medicare Other | Admitting: Vascular Surgery

## 2012-09-18 DIAGNOSIS — N401 Enlarged prostate with lower urinary tract symptoms: Secondary | ICD-10-CM | POA: Diagnosis not present

## 2012-09-18 DIAGNOSIS — D090 Carcinoma in situ of bladder: Secondary | ICD-10-CM | POA: Diagnosis not present

## 2012-09-28 ENCOUNTER — Ambulatory Visit: Payer: Medicare Other | Admitting: Vascular Surgery

## 2012-11-01 ENCOUNTER — Encounter: Payer: Self-pay | Admitting: Vascular Surgery

## 2012-11-02 ENCOUNTER — Encounter (INDEPENDENT_AMBULATORY_CARE_PROVIDER_SITE_OTHER): Payer: Medicare Other | Admitting: *Deleted

## 2012-11-02 ENCOUNTER — Ambulatory Visit (INDEPENDENT_AMBULATORY_CARE_PROVIDER_SITE_OTHER): Payer: Medicare Other | Admitting: Vascular Surgery

## 2012-11-02 ENCOUNTER — Encounter: Payer: Self-pay | Admitting: Vascular Surgery

## 2012-11-02 VITALS — BP 114/71 | HR 75 | Ht 72.0 in | Wt 239.8 lb

## 2012-11-02 DIAGNOSIS — Z48812 Encounter for surgical aftercare following surgery on the circulatory system: Secondary | ICD-10-CM

## 2012-11-02 DIAGNOSIS — I739 Peripheral vascular disease, unspecified: Secondary | ICD-10-CM | POA: Diagnosis not present

## 2012-11-02 DIAGNOSIS — I714 Abdominal aortic aneurysm, without rupture: Secondary | ICD-10-CM | POA: Diagnosis not present

## 2012-11-02 NOTE — Progress Notes (Signed)
[facility] [place]  Established EVAR  History of Present Illness  [name] is a 79 y.o. (April 14, 1933) male who presents for routine follow up s/p EVAR (Date: 02/15/11).  Most recent EVAR duplex (Date: 11/02/2012) demonstrates: no endoleak and stable sac size.  Most recent CTA (Date: 02/10/12) demonstrates: no endoleak and smaller sac size.  The patient has had no back or abdominal pain.  The patient's PMH, PSH, SH, FamHx, Med, and Allergies are unchanged from 02/09/13.  On ROS today: no intermittent claudication or rest pain, no peripheral neurologic sx  Physical Examination  Filed Vitals:   11/02/12 1016  BP: 114/71  Pulse: 75  Height: 6' (1.829 m)  Weight: 239 lb 12.8 oz (108.773 kg)  SpO2: 99%   Body mass index is 32.52 kg/(m^2).  General: A&O x 3, WD, Obese,   Pulmonary: Sym exp, good air movt, CTAB, no rales, rhonchi, & wheezing  Cardiac: RRR, Nl S1, S2, no Murmurs, rubs or gallops  Vascular: Vessel Right Left  Radial Palpable Palpable  Brachial Palpable Palpable  Carotid Palpable, without bruit Palpable, without bruit  Aorta Not palpable N/A  Femoral Palpable Palpable  Popliteal Not palpable Not palpable  PT Not Palpable Not Palpable  DP Not Palpable Not Palpable   Gastrointestinal: soft, NTND, -G/R, - HSM, - masses, - CVAT B, no palpable AAA  Musculoskeletal: M/S 5/5 throughout , Extremities without ischemic changes   Neurologic: Pain and light touch intact in extremities , Motor exam as listed above  Non-Invasive Vascular Imaging  EVAR Duplex (Date: 11/02/2012)  AAA sac size: 6.0 cm x 6.6 cm (6.6 cm x 7.0 cm)  no endoleak detected  Medical Decision Making  [name] is a 79 y.o. male who presents s/p EVAR.  Pt is asymptomatic with stable sac size.  I discussed with the patient the importance of surveillance of the endograft.  The next CTA will be scheduled for 6 months.  The patient will follow up with [place] in 6  months with these studies.  I discussed with the patient going to annual EVAR duplex if the next CTA is stable.  At 5 years s/p EVAR, a final CTA will be obtain to check for late type II endoleak as suggested by the OVER trial.  Thank you for allowing [place] to participate in this patient's care.  Leonides Sake, MD [facility] Office: [phone] Pager: [phone]  11/02/2012, 10:52 AM

## 2012-11-02 NOTE — Addendum Note (Signed)
Addended by: Adria Dill L on: 11/02/2012 11:55 AM   Modules accepted: Orders

## 2012-11-29 ENCOUNTER — Encounter: Payer: Self-pay | Admitting: *Deleted

## 2012-12-04 ENCOUNTER — Other Ambulatory Visit (INDEPENDENT_AMBULATORY_CARE_PROVIDER_SITE_OTHER): Payer: Medicare Other

## 2012-12-04 DIAGNOSIS — E7800 Pure hypercholesterolemia, unspecified: Secondary | ICD-10-CM | POA: Diagnosis not present

## 2012-12-04 LAB — LIPID PANEL
Cholesterol: 183 mg/dL (ref 0–200)
HDL: 40.7 mg/dL (ref >39.00)
Triglycerides: 158 mg/dL — ABNORMAL HIGH (ref 0.0–149.0)

## 2012-12-05 ENCOUNTER — Ambulatory Visit (INDEPENDENT_AMBULATORY_CARE_PROVIDER_SITE_OTHER): Payer: Medicare Other | Admitting: Cardiology

## 2012-12-05 ENCOUNTER — Encounter: Payer: Self-pay | Admitting: Cardiology

## 2012-12-05 VITALS — BP 108/80 | HR 78 | Ht 72.0 in | Wt 232.0 lb

## 2012-12-05 DIAGNOSIS — I714 Abdominal aortic aneurysm, without rupture, unspecified: Secondary | ICD-10-CM

## 2012-12-05 DIAGNOSIS — E785 Hyperlipidemia, unspecified: Secondary | ICD-10-CM

## 2012-12-05 DIAGNOSIS — I2510 Atherosclerotic heart disease of native coronary artery without angina pectoris: Secondary | ICD-10-CM

## 2012-12-05 DIAGNOSIS — I4891 Unspecified atrial fibrillation: Secondary | ICD-10-CM

## 2012-12-05 MED ORDER — ATORVASTATIN CALCIUM 20 MG PO TABS: 20.0000 mg | ORAL_TABLET | Freq: Every day | ORAL | Status: DC

## 2012-12-05 NOTE — Patient Instructions (Signed)
Your physician recommends that you have  lab work today--CBCd/BMET  Stop pravastatin.   Start atorvastatin 20mg  daily.  Your physician recommends that you return for a FASTING lipid profile /liver profile in 2 months.  Your physician wants you to follow-up in: 6 months with Dr Shirlee Latch. (April 2015). You will receive a reminder letter in the mail two months in advance. If you don't receive a letter, please call our office to schedule the follow-up appointment.

## 2012-12-06 ENCOUNTER — Other Ambulatory Visit: Payer: Self-pay | Admitting: Family Medicine

## 2012-12-06 LAB — BASIC METABOLIC PANEL WITH GFR
BUN: 15 mg/dL (ref 6–23)
CO2: 28 meq/L (ref 19–32)
Calcium: 9.7 mg/dL (ref 8.4–10.5)
Chloride: 108 meq/L (ref 96–112)
Creatinine, Ser: 1.2 mg/dL (ref 0.4–1.5)
GFR: 61.84 mL/min
Glucose, Bld: 106 mg/dL — ABNORMAL HIGH (ref 70–99)
Potassium: 4.7 meq/L (ref 3.5–5.1)
Sodium: 143 meq/L (ref 135–145)

## 2012-12-06 LAB — CBC WITH DIFFERENTIAL/PLATELET
Basophils Absolute: 0 10*3/uL (ref 0.0–0.1)
Basophils Relative: 0.4 % (ref 0.0–3.0)
Eosinophils Absolute: 0.1 10*3/uL (ref 0.0–0.7)
Eosinophils Relative: 0.9 % (ref 0.0–5.0)
HCT: 42.8 % (ref 39.0–52.0)
Hemoglobin: 14.2 g/dL (ref 13.0–17.0)
Lymphocytes Relative: 24.1 % (ref 12.0–46.0)
Lymphs Abs: 1.8 10*3/uL (ref 0.7–4.0)
MCHC: 33.3 g/dL (ref 30.0–36.0)
MCV: 90.3 fl (ref 78.0–100.0)
Monocytes Absolute: 0.6 10*3/uL (ref 0.1–1.0)
Monocytes Relative: 8.7 % (ref 3.0–12.0)
Neutro Abs: 4.9 10*3/uL (ref 1.4–7.7)
Neutrophils Relative %: 65.9 % (ref 43.0–77.0)
Platelets: 251 10*3/uL (ref 150.0–400.0)
RBC: 4.73 Mil/uL (ref 4.22–5.81)
RDW: 15.2 % — ABNORMAL HIGH (ref 11.5–14.6)
WBC: 7.4 10*3/uL (ref 4.5–10.5)

## 2012-12-06 NOTE — Progress Notes (Signed)
Patient ID: [name], male   DOB: 1933-11-17, 79 y.o.   MRN: [id number] PCP: Dr. Para March  79 yo with history of chronic atrial fibrillation, CAD s/p CABG, AAA repair, and CKD presents for cardiology followup.  Patient had CABG in 1992 and has not had a cardiac cath since that time.  Last echo in 4/12 showed preserved EF.  He lost his wife last month, which has been hard.  He denies chest pain or exertional dyspnea.  He has had chronic atrial fibrillation for 2-3 years.  He does not feel tachypalpitations and rate is controlled.  He is on apixaban and has had no bleeding problems on this agent.    ECG: atrial fibrillation with left axis deviation.  Labs (3/14): K 4.3, creatinine 1.0, HCT 42.9 Labs (10/14): LDL 111, HDL 41  PMH: 1. Chronic atrial fibrillation: Hematuria on Pradaxa.  2. CAD: CABG x 7 in 1992 (Dr. Andrey Campanile).  Echo (4/12) with EF 55-60%, mild AI, mild MR, PA systolic pressure 45 mmHg.  3. AAA repair: stent graft.  4. CKD 5. OSA: Mild 6. Hypothyroidism 7. HTN 8. Bladder tumor 9. COPD  SH: Quit smoking in 1992, lives in [place] alone, widower, 3 children.   FH: No premature CAD.   ROS: All systems reviewed and negative except as per HPI.   Current Outpatient Prescriptions  Medication Sig Dispense Refill  . apixaban (ELIQUIS) 5 MG TABS tablet Take 1 tablet (5 mg total) by mouth 2 (two) times daily.  180 tablet  3  . ramipril (ALTACE) 5 MG capsule TAKE 1 CAPSULE BY MOUTH DAILY  90 capsule  3  . atorvastatin (LIPITOR) 20 MG tablet Take 1 tablet (20 mg total) by mouth daily.  90 tablet  0  . levothyroxine (SYNTHROID, LEVOTHROID) 137 MCG tablet TAKE 1 TABLET BY MOUTH DAILY  90 tablet  1  . ramipril (ALTACE) 5 MG capsule TAKE 1 CAPSULE BY MOUTH DAILY  90 capsule  3  . verapamil (CALAN-SR) 240 MG CR tablet TAKE 1 TABLET BY MOUTH AT BEDTIME  90 tablet  2   No current facility-administered medications for this visit.   BP 108/80  Pulse 78  Ht 6' (1.829 m)  Wt 232 lb  (105.235 kg)  BMI 31.46 kg/m2 General: NAD Neck: No JVD, no thyromegaly or thyroid nodule.  Lungs: Clear to auscultation bilaterally with normal respiratory effort. CV: Nondisplaced PMI.  Heart regular S1/S2, no S3/S4, no murmur.  1+ ankle edema.  No carotid bruit.  Normal pedal pulses.  Abdomen: Soft, nontender, no hepatosplenomegaly, no distention.  Skin: Intact without lesions or rashes.  Neurologic: Alert and oriented x 3.  Psych: Normal affect. Extremities: No clubbing or cyanosis.  Varicose veins lower legs.   Assessment/Plan: 1. CAD: s/p CABG.  No ischemic symptoms.  Conitnue statin, ACEI.  He is not on aspirin because he is on apixaban.  2. Hyperlipidemia: LDL higher than goal.  Stop pravastatin and start on atorvastatin 20 mg daily with lipids/LFTs in 2 months.   3. Chronic atrial fibrillation: Rate controlled with verapamil and on apixaban.  CBC and BMET today.  4. AAA stent graft repair: Follows at [facility].   Marca Ancona 12/06/2012 .

## 2012-12-11 ENCOUNTER — Encounter: Payer: Self-pay | Admitting: Family Medicine

## 2012-12-11 ENCOUNTER — Ambulatory Visit (INDEPENDENT_AMBULATORY_CARE_PROVIDER_SITE_OTHER): Payer: Medicare Other | Admitting: Family Medicine

## 2012-12-11 VITALS — BP 128/60 | HR 76 | Temp 98.3°F | Wt 235.0 lb

## 2012-12-11 DIAGNOSIS — E039 Hypothyroidism, unspecified: Secondary | ICD-10-CM | POA: Diagnosis not present

## 2012-12-11 DIAGNOSIS — Z23 Encounter for immunization: Secondary | ICD-10-CM | POA: Diagnosis not present

## 2012-12-11 DIAGNOSIS — Z634 Disappearance and death of family member: Secondary | ICD-10-CM

## 2012-12-11 NOTE — Patient Instructions (Signed)
Don't change your meds.  We'll see about getting your thyroid checked in 12/14.  Take care.  

## 2012-12-11 NOTE — Progress Notes (Deleted)
Don't change your meds.  We'll see about getting your thyroid checked in 12/14.  Take care.

## 2012-12-12 DIAGNOSIS — Z634 Disappearance and death of family member: Secondary | ICD-10-CM | POA: Insufficient documentation

## 2012-12-12 NOTE — Progress Notes (Signed)
Widower now.  He is staying busy, has family support.  We talked about getting through the holidays.  His is trying to work through this.   Hypothyroidism.  Due for TSH, we can recheck with other labs in the near future.  No ADE, no neck mass.  No sx suggesting under/over replacement.   Meds, vitals, and allergies reviewed.   ROS: See HPI.  Otherwise, noncontributory.  GEN: nad, alert and oriented HEENT: mucous membranes moist NECK: supple w/o LA, no TMG CV: rrr.  PULM: ctab, no inc wob ABD: soft, +bs EXT: no edema SKIN: no acute rash Speech and affect wnl.

## 2012-12-12 NOTE — Assessment & Plan Note (Signed)
He is staying busy, has family support.  We talked about getting through the holidays.  His is trying to work through this. I offered my support.

## 2012-12-12 NOTE — Assessment & Plan Note (Signed)
Will ask to have TSH done and routed to me when other labs are done at [facility].  Continue as is for now.

## 2012-12-13 ENCOUNTER — Other Ambulatory Visit: Payer: Self-pay | Admitting: Family Medicine

## 2012-12-13 DIAGNOSIS — E039 Hypothyroidism, unspecified: Secondary | ICD-10-CM

## 2013-02-07 ENCOUNTER — Telehealth: Payer: Self-pay | Admitting: Cardiology

## 2013-02-07 ENCOUNTER — Other Ambulatory Visit (INDEPENDENT_AMBULATORY_CARE_PROVIDER_SITE_OTHER): Payer: Medicare Other

## 2013-02-07 DIAGNOSIS — E785 Hyperlipidemia, unspecified: Secondary | ICD-10-CM | POA: Diagnosis not present

## 2013-02-07 DIAGNOSIS — E039 Hypothyroidism, unspecified: Secondary | ICD-10-CM | POA: Diagnosis not present

## 2013-02-07 DIAGNOSIS — I4891 Unspecified atrial fibrillation: Secondary | ICD-10-CM | POA: Diagnosis not present

## 2013-02-07 LAB — LIPID PANEL
HDL: 36.9 mg/dL — ABNORMAL LOW (ref >39.00)
LDL Cholesterol: 95 mg/dL (ref 0–99)
Total CHOL/HDL Ratio: 4
Triglycerides: 172 mg/dL — ABNORMAL HIGH (ref 0.0–149.0)
VLDL: 34.4 mg/dL (ref 0.0–40.0)

## 2013-02-07 LAB — HEPATIC FUNCTION PANEL
AST: 13 U/L (ref 0–37)
Albumin: 4.3 g/dL (ref 3.5–5.2)
Total Bilirubin: 1.1 mg/dL (ref 0.3–1.2)

## 2013-02-07 LAB — TSH: TSH: 3.04 u[IU]/mL (ref 0.35–5.50)

## 2013-02-07 MED ORDER — RIVAROXABAN 20 MG PO TABS: 20.0000 mg | ORAL_TABLET | Freq: Every day | ORAL | Status: DC

## 2013-02-07 NOTE — Telephone Encounter (Signed)
Pt advised  can use Xarelto 20mg  in the place of Eliquis 5mg  bid.

## 2013-02-07 NOTE — Telephone Encounter (Signed)
Patient let some documentation regarding his insurance not wanting to pay for Xarelto.  He wants to know if there is another Rx he can take instead.  You can call him anytime today.

## 2013-02-07 NOTE — Telephone Encounter (Signed)
LMTCB

## 2013-02-07 NOTE — Telephone Encounter (Signed)
That would be fine.  If GFR>50, can use Xarelto 20 mg daily.

## 2013-02-07 NOTE — Telephone Encounter (Signed)
Pt currently taking Eliquis 5mg  two times a day. His insurance will not cover Eliquis  but will cover Xarelto in 2015. Pt asking if OK to change from Eliquis to Xarelto after the first of the year. I will forward to Dr Shirlee Latch for review.

## 2013-02-08 ENCOUNTER — Encounter: Payer: Self-pay | Admitting: Family Medicine

## 2013-02-11 ENCOUNTER — Other Ambulatory Visit: Payer: Self-pay | Admitting: *Deleted

## 2013-02-11 DIAGNOSIS — I2510 Atherosclerotic heart disease of native coronary artery without angina pectoris: Secondary | ICD-10-CM

## 2013-02-11 DIAGNOSIS — E785 Hyperlipidemia, unspecified: Secondary | ICD-10-CM

## 2013-02-11 MED ORDER — ATORVASTATIN CALCIUM 40 MG PO TABS: 40.0000 mg | ORAL_TABLET | Freq: Every day | ORAL | Status: DC

## 2013-03-26 DIAGNOSIS — Z8551 Personal history of malignant neoplasm of bladder: Secondary | ICD-10-CM | POA: Diagnosis not present

## 2013-03-29 ENCOUNTER — Ambulatory Visit: Payer: Medicare Other | Admitting: Family Medicine

## 2013-03-29 ENCOUNTER — Telehealth: Payer: Self-pay | Admitting: Cardiology

## 2013-03-29 ENCOUNTER — Encounter (HOSPITAL_COMMUNITY): Payer: Self-pay | Admitting: Emergency Medicine

## 2013-03-29 ENCOUNTER — Emergency Department (HOSPITAL_COMMUNITY): Payer: Medicare Other

## 2013-03-29 ENCOUNTER — Ambulatory Visit: Payer: Medicare Other | Admitting: Internal Medicine

## 2013-03-29 ENCOUNTER — Emergency Department (HOSPITAL_COMMUNITY)
Admission: EM | Admit: 2013-03-29 | Discharge: 2013-03-29 | Disposition: A | Discharge status: Home / Self Care | Payer: Medicare Other | Attending: Emergency Medicine | Admitting: Emergency Medicine

## 2013-03-29 DIAGNOSIS — R0602 Shortness of breath: Secondary | ICD-10-CM | POA: Insufficient documentation

## 2013-03-29 DIAGNOSIS — I4891 Unspecified atrial fibrillation: Secondary | ICD-10-CM | POA: Diagnosis not present

## 2013-03-29 DIAGNOSIS — Z7901 Long term (current) use of anticoagulants: Secondary | ICD-10-CM | POA: Diagnosis not present

## 2013-03-29 DIAGNOSIS — Z87891 Personal history of nicotine dependence: Secondary | ICD-10-CM | POA: Diagnosis not present

## 2013-03-29 DIAGNOSIS — R7989 Other specified abnormal findings of blood chemistry: Secondary | ICD-10-CM

## 2013-03-29 DIAGNOSIS — I2510 Atherosclerotic heart disease of native coronary artery without angina pectoris: Secondary | ICD-10-CM | POA: Insufficient documentation

## 2013-03-29 DIAGNOSIS — R5381 Other malaise: Secondary | ICD-10-CM | POA: Insufficient documentation

## 2013-03-29 DIAGNOSIS — E785 Hyperlipidemia, unspecified: Secondary | ICD-10-CM | POA: Insufficient documentation

## 2013-03-29 DIAGNOSIS — R748 Abnormal levels of other serum enzymes: Secondary | ICD-10-CM | POA: Diagnosis not present

## 2013-03-29 DIAGNOSIS — R531 Weakness: Secondary | ICD-10-CM

## 2013-03-29 DIAGNOSIS — R5383 Other fatigue: Principal | ICD-10-CM

## 2013-03-29 DIAGNOSIS — Z9889 Other specified postprocedural states: Secondary | ICD-10-CM | POA: Diagnosis not present

## 2013-03-29 DIAGNOSIS — E669 Obesity, unspecified: Secondary | ICD-10-CM | POA: Insufficient documentation

## 2013-03-29 DIAGNOSIS — S0990XA Unspecified injury of head, initial encounter: Secondary | ICD-10-CM | POA: Diagnosis not present

## 2013-03-29 DIAGNOSIS — W19XXXA Unspecified fall, initial encounter: Secondary | ICD-10-CM | POA: Insufficient documentation

## 2013-03-29 DIAGNOSIS — Z951 Presence of aortocoronary bypass graft: Secondary | ICD-10-CM | POA: Diagnosis not present

## 2013-03-29 DIAGNOSIS — E039 Hypothyroidism, unspecified: Secondary | ICD-10-CM | POA: Diagnosis not present

## 2013-03-29 DIAGNOSIS — R799 Abnormal finding of blood chemistry, unspecified: Secondary | ICD-10-CM | POA: Diagnosis not present

## 2013-03-29 DIAGNOSIS — R404 Transient alteration of awareness: Secondary | ICD-10-CM | POA: Insufficient documentation

## 2013-03-29 DIAGNOSIS — Z79899 Other long term (current) drug therapy: Secondary | ICD-10-CM | POA: Insufficient documentation

## 2013-03-29 DIAGNOSIS — S298XXA Other specified injuries of thorax, initial encounter: Secondary | ICD-10-CM | POA: Diagnosis not present

## 2013-03-29 DIAGNOSIS — Z8546 Personal history of malignant neoplasm of prostate: Secondary | ICD-10-CM | POA: Diagnosis not present

## 2013-03-29 DIAGNOSIS — I4949 Other premature depolarization: Secondary | ICD-10-CM | POA: Diagnosis not present

## 2013-03-29 DIAGNOSIS — N189 Chronic kidney disease, unspecified: Secondary | ICD-10-CM | POA: Insufficient documentation

## 2013-03-29 DIAGNOSIS — I129 Hypertensive chronic kidney disease with stage 1 through stage 4 chronic kidney disease, or unspecified chronic kidney disease: Secondary | ICD-10-CM | POA: Insufficient documentation

## 2013-03-29 DIAGNOSIS — Y92009 Unspecified place in unspecified non-institutional (private) residence as the place of occurrence of the external cause: Secondary | ICD-10-CM | POA: Insufficient documentation

## 2013-03-29 DIAGNOSIS — Y9301 Activity, walking, marching and hiking: Secondary | ICD-10-CM | POA: Insufficient documentation

## 2013-03-29 LAB — BASIC METABOLIC PANEL
BUN: 14 mg/dL (ref 6–23)
CO2: 23 meq/L (ref 19–32)
CREATININE: 1.18 mg/dL (ref 0.50–1.35)
Calcium: 9.1 mg/dL (ref 8.4–10.5)
Chloride: 97 mEq/L (ref 96–112)
GFR calc Af Amer: 67 mL/min — ABNORMAL LOW (ref >90)
GFR calc non Af Amer: 58 mL/min — ABNORMAL LOW (ref >90)
GLUCOSE: 129 mg/dL — AB (ref 70–99)
Potassium: 3.7 mEq/L (ref 3.7–5.3)
Sodium: 133 mEq/L — ABNORMAL LOW (ref 137–147)

## 2013-03-29 LAB — URINALYSIS, ROUTINE W REFLEX MICROSCOPIC
Bilirubin Urine: NEGATIVE
GLUCOSE, UA: NEGATIVE mg/dL
Ketones, ur: NEGATIVE mg/dL
Nitrite: NEGATIVE
Protein, ur: NEGATIVE mg/dL
SPECIFIC GRAVITY, URINE: 1.01 (ref 1.005–1.030)
UROBILINOGEN UA: 0.2 mg/dL (ref 0.0–1.0)
pH: 5.5 (ref 5.0–8.0)

## 2013-03-29 LAB — CBC WITH DIFFERENTIAL/PLATELET
Basophils Absolute: 0 10*3/uL (ref 0.0–0.1)
Basophils Relative: 0 % (ref 0–1)
EOS PCT: 0 % (ref 0–5)
Eosinophils Absolute: 0 10*3/uL (ref 0.0–0.7)
HCT: 37.3 % — ABNORMAL LOW (ref 39.0–52.0)
HEMOGLOBIN: 12.7 g/dL — AB (ref 13.0–17.0)
LYMPHS ABS: 1.1 10*3/uL (ref 0.7–4.0)
LYMPHS PCT: 7 % — AB (ref 12–46)
MCH: 30.2 pg (ref 26.0–34.0)
MCHC: 34 g/dL (ref 30.0–36.0)
MCV: 88.8 fL (ref 78.0–100.0)
Monocytes Absolute: 1.5 10*3/uL — ABNORMAL HIGH (ref 0.1–1.0)
Monocytes Relative: 10 % (ref 3–12)
Neutro Abs: 12.4 10*3/uL — ABNORMAL HIGH (ref 1.7–7.7)
Neutrophils Relative %: 83 % — ABNORMAL HIGH (ref 43–77)
Platelets: 192 10*3/uL (ref 150–400)
RBC: 4.2 MIL/uL — AB (ref 4.22–5.81)
RDW: 14.4 % (ref 11.5–15.5)
WBC: 14.9 10*3/uL — ABNORMAL HIGH (ref 4.0–10.5)

## 2013-03-29 LAB — URINE MICROSCOPIC-ADD ON

## 2013-03-29 LAB — PRO B NATRIURETIC PEPTIDE: Pro B Natriuretic peptide (BNP): 1046 pg/mL — ABNORMAL HIGH (ref 0–450)

## 2013-03-29 LAB — TROPONIN I

## 2013-03-29 LAB — POCT I-STAT TROPONIN I: TROPONIN I, POC: 0 ng/mL (ref 0.00–0.08)

## 2013-03-29 MED ORDER — FUROSEMIDE 20 MG PO TABS: 20.0000 mg | ORAL_TABLET | Freq: Every day | ORAL | Status: DC

## 2013-03-29 NOTE — Discharge Instructions (Signed)
We saw you in the ER for the shortness of breath. All of our cardiac workup is normal, including labs, EKG and chest X-RAY are normal. We are not sure what is causing your discomfort, but we feel comfortable sending you home at this time. The workup in the ER is not complete, and so please see the Cardiologist as requested for further evaluation.   Heart Failure Heart failure is a condition in which the heart has trouble pumping blood. This means your heart does not pump blood efficiently for your body to work well. In some cases of heart failure, fluid may back up into your lungs or you may have swelling (edema) in your lower legs. Heart failure is usually a long-term (chronic) condition. It is important for you to take good care of yourself and follow your caregiver's treatment plan. CAUSES  Some health conditions can cause heart failure. Those health conditions include:  High blood pressure (hypertension) causes the heart muscle to work harder than normal. When pressure in the blood vessels is high, the heart needs to pump (contract) with more force in order to circulate blood throughout the body. High blood pressure eventually causes the heart to become stiff and weak.  Coronary artery disease (CAD) is the buildup of cholesterol and fat (plaque) in the arteries of the heart. The blockage in the arteries deprives the heart muscle of oxygen and blood. This can cause chest pain and may lead to a heart attack. High blood pressure can also contribute to CAD.  Heart attack (myocardial infarction) occurs when 1 or more arteries in the heart become blocked. The loss of oxygen damages the muscle tissue of the heart. When this happens, part of the heart muscle dies. The injured tissue does not contract as well and weakens the heart's ability to pump blood.  Abnormal heart valves can cause heart failure when the heart valves do not open and close properly. This makes the heart muscle pump harder to keep the  blood flowing.  Heart muscle disease (cardiomyopathy or myocarditis) is damage to the heart muscle from a variety of causes. These can include drug or alcohol abuse, infections, or unknown reasons. These can increase the risk of heart failure.  Lung disease makes the heart work harder because the lungs do not work properly. This can cause a strain on the heart, leading it to fail.  Diabetes increases the risk of heart failure. High blood sugar contributes to high fat (lipid) levels in the blood. Diabetes can also cause slow damage to tiny blood vessels that carry important nutrients to the heart muscle. When the heart does not get enough oxygen and food, it can cause the heart to become weak and stiff. This leads to a heart that does not contract efficiently.  Other conditions can contribute to heart failure. These include abnormal heart rhythms, thyroid problems, and low blood counts (anemia). Certain unhealthy behaviors can increase the risk of heart failure. Those unhealthy behaviors include:  Being overweight.  Smoking or chewing tobacco.  Eating foods high in fat and cholesterol.  Abusing illicit drugs or alcohol.  Lacking physical activity. SYMPTOMS  Heart failure symptoms may vary and can be hard to detect. Symptoms may include:  Shortness of breath with activity, such as climbing stairs.  Persistent cough.  Swelling of the feet, ankles, legs, or abdomen.  Unexplained weight gain.  Difficulty breathing when lying flat (orthopnea).  Waking from sleep because of the need to sit up and get more air.  Rapid heartbeat.  Fatigue and loss of energy.  Feeling lightheaded, dizzy, or close to fainting.  Loss of appetite.  Nausea.  Increased urination during the night (nocturia). DIAGNOSIS  A diagnosis of heart failure is based on your history, symptoms, physical examination, and diagnostic tests. Diagnostic tests for heart failure may  include:  Echocardiography.  Electrocardiography.  Chest X-ray.  Blood tests.  Exercise stress test.  Cardiac angiography.  Radionuclide scans. TREATMENT  Treatment is aimed at managing the symptoms of heart failure. Medicines, behavioral changes, or surgical intervention may be necessary to treat heart failure.  Medicines to help treat heart failure may include:  Angiotensin-converting enzyme (ACE) inhibitors. This type of medicine blocks the effects of a blood protein called angiotensin-converting enzyme. ACE inhibitors relax (dilate) the blood vessels and help lower blood pressure.  Angiotensin receptor blockers. This type of medicine blocks the actions of a blood protein called angiotensin. Angiotensin receptor blockers dilate the blood vessels and help lower blood pressure.  Water pills (diuretics). Diuretics cause the kidneys to remove salt and water from the blood. The extra fluid is removed through urination. This loss of extra fluid lowers the volume of blood the heart pumps.  Beta blockers. These prevent the heart from beating too fast and improve heart muscle strength.  Digitalis. This increases the force of the heartbeat.  Healthy behavior changes include:  Obtaining and maintaining a healthy weight.  Stopping smoking or chewing tobacco.  Eating heart healthy foods.  Limiting or avoiding alcohol.  Stopping illicit drug use.  Physical activity as directed by your caregiver.  Surgical treatment for heart failure may include:  A procedure to open blocked arteries, repair damaged heart valves, or remove damaged heart muscle tissue.  A pacemaker to improve heart muscle function and control certain abnormal heart rhythms.  An internal cardioverter defibrillator to treat certain serious abnormal heart rhythms.  A left ventricular assist device to assist the pumping ability of the heart. HOME CARE INSTRUCTIONS   Take your medicine as directed by your  caregiver. Medicines are important in reducing the workload of your heart, slowing the progression of heart failure, and improving your symptoms.  Do not stop taking your medicine unless directed by your caregiver.  Do not skip any dose of medicine.  Refill your prescriptions before you run out of medicine. Your medicines are needed every day.  Take over-the-counter medicine only as directed by your caregiver or pharmacist.  Engage in moderate physical activity if directed by your caregiver. Moderate physical activity can benefit some people. The elderly and people with severe heart failure should consult with a caregiver for physical activity recommendations.  Eat heart healthy foods. Food choices should be free of trans fat and low in saturated fat, cholesterol, and salt (sodium). Healthy choices include fresh or frozen fruits and vegetables, fish, lean meats, legumes, fat-free or low-fat dairy products, and whole grain or high fiber foods. Talk to a dietitian to learn more about heart healthy foods.  Limit sodium if directed by your caregiver. Sodium restriction may reduce symptoms of heart failure in some people. Talk to a dietitian to learn more about heart healthy seasonings.  Use healthy cooking methods. Healthy cooking methods include roasting, grilling, broiling, baking, poaching, steaming, or stir-frying. Talk to a dietitian to learn more about healthy cooking methods.  Limit fluids if directed by your caregiver. Fluid restriction may reduce symptoms of heart failure in some people.  Weigh yourself every day. Daily weights are important in the early  recognition of excess fluid. You should weigh yourself every morning after you urinate and before you eat breakfast. Wear the same amount of clothing each time you weigh yourself. Record your daily weight. Provide your caregiver with your weight record.  Monitor and record your blood pressure if directed by your caregiver.  Check your  pulse if directed by your caregiver.  Lose weight if directed by your caregiver. Weight loss may reduce symptoms of heart failure in some people.  Stop smoking or chewing tobacco. Nicotine makes your heart work harder by causing your blood vessels to constrict. Do not use nicotine gum or patches before talking to your caregiver.  Schedule and attend follow-up visits as directed by your caregiver. It is important to keep all your appointments.  Limit alcohol intake to no more than 1 drink per day for nonpregnant women and 2 drinks per day for men. Drinking more than that is harmful to your heart. Tell your caregiver if you drink alcohol several times a week. Talk with your caregiver about whether alcohol is safe for you. If your heart has already been damaged by alcohol or you have severe heart failure, drinking alcohol should be stopped completely.  Stop illicit drug use.  Stay up-to-date with immunizations. It is especially important to prevent respiratory infections through current pneumococcal and influenza immunizations.  Manage other health conditions such as hypertension, diabetes, thyroid disease, or abnormal heart rhythms as directed by your caregiver.  Learn to manage stress.  Plan rest periods when fatigued.  Learn strategies to manage high temperatures. If the weather is extremely hot:  Avoid vigorous physical activity.  Use air conditioning or fans or seek a cooler location.  Avoid caffeine and alcohol.  Wear loose-fitting, lightweight, and light-colored clothing.  Learn strategies to manage cold temperatures. If the weather is extremely cold:  Avoid vigorous physical activity.  Layer clothes.  Wear mittens or gloves, a hat, and a scarf when going outside.  Avoid alcohol.  Obtain ongoing education and support as needed.  Participate or seek rehabilitation as needed to maintain or improve independence and quality of life. SEEK MEDICAL CARE IF:   Your weight  increases by 03 lb/1.4 kg in 1 day or 05 lb/2.3 kg in a week.  You have increasing shortness of breath that is unusual for you.  You are unable to participate in your usual physical activities.  You tire easily.  You cough more than normal, especially with physical activity.  You have any or more swelling in areas such as your hands, feet, ankles, or abdomen.  You are unable to sleep because it is hard to breathe.  You feel like your heart is beating fast (palpitations).  You become dizzy or lightheaded upon standing up. SEEK IMMEDIATE MEDICAL CARE IF:   You have difficulty breathing.  There is a change in mental status such as decreased alertness or difficulty with concentration.  You have a pain or discomfort in your chest.  You have an episode of fainting (syncope). MAKE SURE YOU:   Understand these instructions.  Will watch your condition.  Will get help right away if you are not doing well or get worse. Document Released: 02/14/2005 Document Revised: 06/11/2012 Document Reviewed: 03/08/2012 [facility] Patient Information 2014 [place], [place].

## 2013-03-29 NOTE — ED Provider Notes (Signed)
CSN: [id number]     Arrival date & time 03/29/13  1310 History   First MD Initiated Contact with Patient 03/29/13 1426     Chief Complaint  Patient presents with  . Shortness of Breath  . Weakness   (Consider location/radiation/quality/duration/timing/severity/associated sxs/prior Treatment) HPI Comments: Pt with hx of AAA s/p graft placement, afib on pradaxa, htb, hl. Pt comes in with cc of fall, weakness. Pt states that he fell last night. Unknown cause for the fall - he doesn't think he fainted. Pt felt really weak after fall and it took him 15 minutes to get out of the shower. Pt has thereafter continued feeling weak and and his daughter thinks patient is having some dib. Pt doesn't feel that he is short of breath. He has no chest pain. PT also denies any coughing, recent chest pain, exertional dyspnea. He has no known CAd, no recent provocative testing.  Patient is a 80 y.o. male presenting with shortness of breath and weakness. The history is provided by the patient and medical records.  Shortness of Breath Associated symptoms: no abdominal pain, no chest pain and no cough   Weakness Associated symptoms include shortness of breath. Pertinent negatives include no chest pain and no abdominal pain.    Past Medical History  Diagnosis Date  . Special screening for malignant neoplasm of prostate   . Hyperglycemia diet controlled  . Obesity   . Hypothyroidism   . Hyperlipidemia   . Organic impotence   . Hypertension   . Ventricular ectopy     mild  . Paroxysmal atrial fibrillation   . Hiatal hernia     repaired in 1992  . Bladder tumor   . Hematuria bladder tumor ---  followed by Dr Karsten Ro  . S/P CABG x 7 1992  . Coronary artery disease s/p cabg x7  1992    CARDIOLOGIST- DR Lia Foyer-  VISIT NOTE 12-09-202 IN EPIC  . S/P AAA repair using bifurcation graft   . Chronic kidney disease   . Sleep apnea no rx cpap    mild- tested  2012  . AAA (abdominal aortic aneurysm) 04/29/11  .  Atrial fibrillation    Past Surgical History  Procedure Laterality Date  . Coronary artery bypass graft  05-1990    x7   . Cardiac catheterization  03-1990    Positive  . [place]  10/31/00    WNL  . [place]  03/28/02  . Endovascular stent insertion  02/15/2011    Procedure: ENDOVASCULAR STENT GRAFT INSERTION;  Surgeon: Hinda Lenis, MD;  Location: [facility];  Service: Vascular;  Laterality: N/A;  Insertion of endovascular stent graft   . Cardiovascular stress test  02-01-2011  NUCLEAR NO EXERCISE    LOW RISK STUDY.  SMALL APICAL DEFECT. NO EVIDENCE OF MULTIZONE ISCHEMIA.  . Inguinal hernia repair  1997    left  . Transurethral resection of bladder tumor  03/25/2011    Procedure: TRANSURETHRAL RESECTION OF BLADDER TUMOR (TURBT);  Surgeon: Claybon Jabs, MD;  Location: [facility];  Service: Urology;  Laterality: N/A;   Family History  Problem Relation Age of Onset  . Obesity Mother 73    deceased  . Hypertension Mother   . Diabetes Mother   . Heart disease Mother     before age 63  . Hyperlipidemia Mother   . Alcohol abuse Sister 14    deceased  . Other Father     deceased unknown  .  Colon cancer Neg Hx   . Prostate cancer Neg Hx   . Anesthesia problems Neg Hx    History  Substance Use Topics  . Smoking status: Former Smoker -- 30 years    Types: Cigarettes    Quit date: 02/28/1990  . Smokeless tobacco: Never Used  . Alcohol Use: Yes     Comment: rare    Review of Systems  Constitutional: Negative for activity change and appetite change.  Respiratory: Positive for shortness of breath. Negative for cough.   Cardiovascular: Negative for chest pain.  Gastrointestinal: Negative for abdominal pain.  Genitourinary: Negative for dysuria.  Neurological: Positive for weakness.    Allergies  Dabigatran etexilate mesylate  Home Medications   Current Outpatient Rx  Name  Route  Sig  Dispense  Refill  . atorvastatin (LIPITOR) 40 MG tablet   Oral    Take 40 mg by mouth every evening.         Marland Kitchen levothyroxine (SYNTHROID, LEVOTHROID) 137 MCG tablet   Oral   Take 137 mcg by mouth daily before breakfast.         . Menthol-Methyl Salicylate (MUSCLE RUB) 10-15 % CREA   Topical   Apply 1 application topically as needed for muscle pain.         . ramipril (ALTACE) 5 MG capsule   Oral   Take 5 mg by mouth daily.         . Rivaroxaban (XARELTO) 20 MG TABS tablet   Oral   Take 20 mg by mouth daily with supper.         . verapamil (CALAN-SR) 240 MG CR tablet   Oral   Take 240 mg by mouth at bedtime.          BP 130/68  Pulse 94  Temp(Src) 99.3 F (37.4 C) (Oral)  Resp 20  Ht 6' (1.829 m)  Wt 234 lb (106.142 kg)  BMI 31.73 kg/m2  SpO2 97% Physical Exam  Nursing note and vitals reviewed. Constitutional: He is oriented to person, place, and time. He appears well-developed.  HENT:  Head: Normocephalic and atraumatic.  Eyes: Conjunctivae and EOM are normal. Pupils are equal, round, and reactive to light.  Neck: Normal range of motion. Neck supple. No JVD present.  Cardiovascular: Normal rate and regular rhythm.   No murmur heard. Pulmonary/Chest: Effort normal and breath sounds normal.  Abdominal: Soft. Bowel sounds are normal. He exhibits no distension. There is no tenderness. There is no rebound and no guarding.  Musculoskeletal: He exhibits no edema and no tenderness.  Neurological: He is alert and oriented to person, place, and time.  Skin: Skin is warm.    ED Course  Procedures (including critical care time) Labs Review Labs Reviewed  CBC WITH DIFFERENTIAL - Abnormal; Notable for the following:    WBC 14.9 (*)    RBC 4.20 (*)    Hemoglobin 12.7 (*)    HCT 37.3 (*)    Neutrophils Relative % 83 (*)    Neutro Abs 12.4 (*)    Lymphocytes Relative 7 (*)    Monocytes Absolute 1.5 (*)    All other components within normal limits  BASIC METABOLIC PANEL - Abnormal; Notable for the following:    Sodium 133  (*)    Glucose, Bld 129 (*)    GFR calc non Af Amer 58 (*)    GFR calc Af Amer 67 (*)    All other components within normal limits  PRO B NATRIURETIC  PEPTIDE - Abnormal; Notable for the following:    Pro B Natriuretic peptide (BNP) 1046.0 (*)    All other components within normal limits  URINALYSIS, ROUTINE W REFLEX MICROSCOPIC  POCT I-STAT TROPONIN I   Imaging Review Dg Chest 2 View  03/29/2013   CLINICAL DATA:  Fall, shortness of Breath  EXAM: CHEST  2 VIEW  COMPARISON:  02/10/2012  FINDINGS: Cardiomediastinal silhouette is stable. No acute infiltrate or pleural effusion. No pulmonary edema. Status post median sternotomy.  IMPRESSION: No active cardiopulmonary disease.   Electronically Signed   By: Lahoma Crocker M.D.   On: 03/29/2013 14:12   Ct Head Wo Contrast  03/29/2013   CLINICAL DATA:  The patient fell last night. Shortness of breath. Weakness.  EXAM: CT HEAD WITHOUT CONTRAST  TECHNIQUE: Contiguous axial images were obtained from the base of the skull through the vertex without intravenous contrast.  COMPARISON:  None.  FINDINGS: No mass lesion. No midline shift. No acute hemorrhage or hematoma. No extra-axial fluid collections. No evidence of acute infarction. Brain parenchyma is normal. Osseous structures are normal.  IMPRESSION: Normal exam.   Electronically Signed   By: Rozetta Nunnery M.D.   On: 03/29/2013 14:06    EKG Interpretation    Date/Time:  Friday March 29 2013 13:20:02 EST Ventricular Rate:  101 PR Interval:    QRS Duration: 108 QT Interval:  358 QTC Calculation: 464 R Axis:   -23 Text Interpretation:  Atrial fibrillation with rapid ventricular response with premature ventricular or aberrantly conducted complexes Nonspecific ST abnormality Abnormal ECG Confirmed by Kathrynn Humble, MD, Dolores Mcgovern ([id number]) on 03/29/2013 2:16:39 PM Also confirmed by Kathrynn Humble, MD, Dabney Dever ([id number])  on 03/29/2013 5:19:24 PM            MDM  No diagnosis found.  Pt comes in with cc of fall, and  weakness. He has AAA hx, but is s/p repair. He has HTN, HL, afib as well - but no known CAD, CHF.  He comes in after a fall, and with generalized weakness. No infectious sx. Pt's daughter  States that patient is breathing a little heavy - pt doesn't feel so, and he deneis orthopnea, PND. On exam there is no fluid overload at this time, no jvd. And CXR shows no pulm edema.  Pt's BNP is > 1000 -not sure what to make of it, could be onset of mild CHF?  We ambulated patient in the ED, and he did well.  Plan is to get trop x 2. If neg, he will be given Cards f/u.  Imaging, including CT head is normal.   Varney Biles, MD 03/29/13 1733

## 2013-03-29 NOTE — Telephone Encounter (Signed)
New message         Pt daughter says father has not been his normal self. She states that it is not an emergency situation but would like for her father to be seen by dr Aundra Dubin before next weekend.

## 2013-03-29 NOTE — ED Notes (Signed)
Pt reports getting up to use bathroom early in the morning, falling in bathroom.  Pt unsure of why he fell, unsure of LOC.  Pt reports he was on the floor for a few minutes and unable to move extremities.  Pt was able to get up and called son, who went to pt's house.  Per daughter, pt was slow to respond to questions and this morning, was disoriented per daughter.

## 2013-03-29 NOTE — ED Notes (Signed)
Pt reporting last night he fell while walking to the bathroom there was no LOC. Pt reports he was not able to get up and had to crawl back into bedroom to call his son on the phone. Since then pt reports he has "just felt bad", no appetite, feeling weak. Pt taking deep breaths in triage appears SOB. Denies any cp. No neuro deficits at this time. Pt ambulatory. Speech is clear and concise. Talking with daughter. Pt with no grade temp, EKG AFIB.

## 2013-03-29 NOTE — ED Provider Notes (Signed)
Second troponin normal. Urinalysis does show some white cells and few bacteria. Discussed with patient and he had recent instrumentation by Dr. Karsten Ro.cultures sent in and will start antibiotics if it is positive.    [name]. [name], MD 03/29/13 [phone]

## 2013-03-29 NOTE — Telephone Encounter (Signed)
Pt's daughter is aware that Dr.Mclean is not in this office today. She is aware that Md will be in this office on Wednesday 2/4. Zigmund Daniel states will call pt's PCP Dr. Damita Dunnings so he can to be seen today.

## 2013-03-29 NOTE — ED Notes (Signed)
Pt ambulated in hall without difficulty. Pt O2sats remained above 96% ra.

## 2013-03-29 NOTE — Telephone Encounter (Signed)
Left [name] pt's daughter a message to call back.

## 2013-03-31 LAB — URINE CULTURE: Colony Count: >=100000

## 2013-04-01 ENCOUNTER — Telehealth: Payer: Self-pay | Admitting: Cardiology

## 2013-04-01 NOTE — Telephone Encounter (Signed)
New Problem:  Pt's daughter, Zigmund Daniel, is calling b/c the pt fell Thursday night. Daughter states her father is disoriented and she feels like fluid is building up around his heart. She also states he went to the hospital to get checked out. Zigmund Daniel is requesting he see Dr. Aundra Dubin. His next available appt is in mid March. Zigmund Daniel agreed to have her father come in tomorrow to see Richardson Dopp. However, She would like to know if there is anyway Dr. Aundra Dubin can work her father in sooner. Zigmund Daniel is aware Dr. Aundra Dubin is off today and tomorrow.

## 2013-04-01 NOTE — Telephone Encounter (Signed)
Spoke with pts daughter and confirmed that it is unlikely that Dr. Aundra Dubin can see the patient on Wednesday, his schedule is full and he is DOD also.  She is concerned that he is "having issues with his blood thinner" but specifically she states he is cold all of the time and just doesn't feel good.  He fell Thursday night and was evaluated at the ED and sent home with instructions to follow up with Dr. Aundra Dubin.  Pt's daughter agrees to keep appointment for him with Richardson Dopp tomorrow am.

## 2013-04-01 NOTE — Progress Notes (Signed)
ED Antimicrobial Stewardship Positive Culture Follow Up   [name] is an 80 y.o. male who presented to [facility] on 03/29/2013 with a chief complaint of  Chief Complaint  Patient presents with  . Shortness of Breath  . Weakness    Recent Results (from the past 720 hour(s))  URINE CULTURE     Status: None   Collection Time    03/29/13  2:57 PM      Result Value Range Status   Specimen Description URINE, RANDOM   Final   Special Requests NONE   Final   Culture  Setup Time     Final   Value: 03/29/2013 20:55     Performed at [facility]     Final   Value: >=100,000 COLONIES/ML     Performed at [facility]   Culture     Final   Value: ESCHERICHIA COLI     Performed at [facility]   Report Status 03/31/2013 FINAL   Final   Organism ID, Bacteria ESCHERICHIA COLI   Final     [x]  Patient discharged originally without antimicrobial agent and treatment is now indicated  New antibiotic prescription: Bactrim DS 1 tablet BID x 7 days  ED Provider: Delos Haring, PA-C   Candie Mile 04/01/2013, 10:11 AM Infectious Diseases Pharmacist Phone# [phone]

## 2013-04-01 NOTE — ED Notes (Signed)
Post ED Visit - Positive Culture Follow-up: Successful Patient Follow-Up  Culture assessed and recommendations reviewed by: []  Wes [place], Pharm.D., BCPS [x]  Heide Guile, Pharm.D., BCPS []  Alycia Rossetti, Pharm.D., BCPS []  [place], Pharm.D., BCPS, AAHIVP []  Legrand [place], Pharm.D., BCPS, AAHIVP  [X]  Patient discharged originally without antimicrobial agent and treatment is now indicated  New antibiotic prescription: Bactrim DS 1 tablet BID x 7 days  ED Provider: Delos Haring, PA-C     Varney Baas 04/01/2013, 12:52 PM

## 2013-04-02 ENCOUNTER — Telehealth: Payer: Self-pay | Admitting: *Deleted

## 2013-04-02 ENCOUNTER — Ambulatory Visit (INDEPENDENT_AMBULATORY_CARE_PROVIDER_SITE_OTHER): Payer: Medicare Other | Admitting: Physician Assistant

## 2013-04-02 ENCOUNTER — Encounter: Payer: Self-pay | Admitting: Physician Assistant

## 2013-04-02 VITALS — BP 137/73 | HR 83 | Ht 72.0 in | Wt 243.0 lb

## 2013-04-02 DIAGNOSIS — R0602 Shortness of breath: Secondary | ICD-10-CM | POA: Diagnosis not present

## 2013-04-02 DIAGNOSIS — E785 Hyperlipidemia, unspecified: Secondary | ICD-10-CM

## 2013-04-02 DIAGNOSIS — I10 Essential (primary) hypertension: Secondary | ICD-10-CM | POA: Diagnosis not present

## 2013-04-02 DIAGNOSIS — I2510 Atherosclerotic heart disease of native coronary artery without angina pectoris: Secondary | ICD-10-CM | POA: Diagnosis not present

## 2013-04-02 DIAGNOSIS — I4891 Unspecified atrial fibrillation: Secondary | ICD-10-CM

## 2013-04-02 DIAGNOSIS — N189 Chronic kidney disease, unspecified: Secondary | ICD-10-CM | POA: Diagnosis not present

## 2013-04-02 DIAGNOSIS — N390 Urinary tract infection, site not specified: Secondary | ICD-10-CM

## 2013-04-02 LAB — BASIC METABOLIC PANEL
BUN: 17 mg/dL (ref 6–23)
CALCIUM: 8.5 mg/dL (ref 8.4–10.5)
CO2: 26 mEq/L (ref 19–32)
CREATININE: 1.4 mg/dL (ref 0.4–1.5)
Chloride: 97 mEq/L (ref 96–112)
GFR: 51.79 mL/min — ABNORMAL LOW (ref >60.00)
Glucose, Bld: 113 mg/dL — ABNORMAL HIGH (ref 70–99)
Potassium: 3.6 mEq/L (ref 3.5–5.1)
Sodium: 132 mEq/L — ABNORMAL LOW (ref 135–145)

## 2013-04-02 LAB — BRAIN NATRIURETIC PEPTIDE: PRO B NATRI PEPTIDE: 127 pg/mL — AB (ref 0.0–100.0)

## 2013-04-02 MED ORDER — APIXABAN 5 MG PO TABS: 5.0000 mg | ORAL_TABLET | Freq: Two times a day (BID) | ORAL | Status: DC

## 2013-04-02 NOTE — Telephone Encounter (Signed)
s/w pt's son who has been notified about lab results with verbal understanding to Plan of Care

## 2013-04-02 NOTE — Patient Instructions (Signed)
START ELIQUIS 5 MG 1 TABLET TWICE DAILY; RX SENT IN  STOP XARELTO  LAB WORK TODAY; BMET, BNP  Your physician has requested that you have an echocardiogram. Echocardiography is a painless test that uses sound waves to create images of your heart. It provides your doctor with information about the size and shape of your heart and how well your heart's chambers and valves are working. This procedure takes approximately one hour. There are no restrictions for this procedure.  Your physician recommends that you schedule a follow-up appointment in: 2-3 [place], [place] IS IN THE OFFICE

## 2013-04-02 NOTE — Progress Notes (Signed)
[address] [place],   [address] Phone: [phone] Fax:  [phone]  Date:  04/02/2013   ID:  [name], DOB 1933-07-20, MRN [id number]  PCP:  Elsie Stain, MD  Cardiologist:  Dr.  Bing Quarry => Dr. Loralie Champagne    History of Present Illness: [name] is a 80 y.o. male with a hx of chronic atrial fibrillation, CAD s/p CABG, AAA repair, and CKD. Patient had CABG in 1992 and has not had a cardiac cath since that time.  He has had chronic atrial fibrillation for 2-3 years. He is on apixaban.  Myoview (01/2011): Fixed inferior defect suggestive of prior MI versus diaphragmatic attenuation, apical reversibility possibly suspicious for small area of infarction with peri-infarct ischemia, no significant change from prior study, not gated, low risk study.  Echo (05/2010): Moderate LVH, EF 55-65%, mild AI, mild MR, moderate LAE, mild RAE, moderate to severe TR, PASP 45.  He established with Dr. Aundra Dubin 11/2012.   Patient was recently seen in the emergency room 03/29/2013 with dyspnea and weakness. Records indicate that he had fallen. He reported weakness. BNP was elevated at 1046. Chest x-ray was unremarkable. Head CT was normal.  Cardiac enzymes were negative.  Of note, urine culture was positive for >100K CFU of Escherichia coli.  The patient's insurance company changed his Eliquis to Xarelto several weeks ago. Since that time, he has noted chills. These seem to occur in the evening after taking Xarelto. He felt weak and fell while going to the bathroom in the middle of the night on the date that he went to the emergency room. He was unable to get up off of the floor for some time. He has noted some increased dyspnea.  This is more so with bending over than anything else. He describes NYHA class II symptoms. He denies orthopnea, PND or significant pedal edema. His weight has increased recently. He is not certain if he has noted increased abdominal girth. He denies any  chest discomfort. He denies syncope.  He changed the timing of the Xarelto several days ago. He noted symptoms of chills after taking the medication. He stopped taking it yesterday and has felt better since.  Recent Labs: 02/07/2013: ALT 13; HDL Cholesterol 36.90*; LDL (calc) 95; TSH 3.04  03/29/2013: Creatinine 1.18; Hemoglobin 12.7*; Potassium 3.7; Pro B Natriuretic peptide (BNP) 1046.0*   Wt Readings from Last 3 Encounters:  04/02/13 243 lb (110.224 kg)  03/29/13 234 lb (106.142 kg)  12/11/12 235 lb (106.595 kg)     Past Medical History  Diagnosis Date  . Hyperglycemia diet controlled  . Obesity   . Hyperlipidemia   . Organic impotence   . Hypertension   . Hiatal hernia     repaired in 1992  . Bladder tumor     s/p excision  . Hematuria bladder tumor ---  followed by Dr Karsten Ro  . S/P CABG x 7 1992  . S/P AAA repair using bifurcation graft   . Sleep apnea no rx cpap    mild- tested  2012  . Coronary artery disease s/p cabg x7  1992    a. s/p CABG in 1992 (X 7 - Dr. Redmond Pulling);  b.  Myoview (01/2011): Fixed inferior defect suggestive of prior MI versus diaphragmatic attenuation, apical reversibility possibly suspicious for small area of infarction with peri-infarct ischemia, no significant change from prior study, not gated, low risk study;  c.  Echo (05/2010): Mod LVH, EF 55-65%, mild  AI, mild MR, moderate LAE, mild RAE, moderate   . Ventricular ectopy     mild  . Atrial fibrillation     a. permanent;  b. Hematuria on Pradaxa;  c. cold intol and chills with Xarelto  . AAA (abdominal aortic aneurysm) 04/29/11    s/p stent graft repair 2012  . Chronic kidney disease   . Hypothyroidism   . COPD (chronic obstructive pulmonary disease)     Past Surgical History  Procedure Laterality Date  . Coronary artery bypass graft  05-1990    x7   . Cardiac catheterization  03-1990    Positive  . Ett  10/31/00    WNL  . [place]  03/28/02  . Endovascular stent insertion   02/15/2011    Procedure: ENDOVASCULAR STENT GRAFT INSERTION;  Surgeon: Hinda Lenis, MD;  Location: [facility];  Service: Vascular;  Laterality: N/A;  Insertion of endovascular stent graft   . Cardiovascular stress test  02-01-2011  NUCLEAR NO EXERCISE    LOW RISK STUDY.  SMALL APICAL DEFECT. NO EVIDENCE OF MULTIZONE ISCHEMIA.  . Inguinal hernia repair  1997    left  . Transurethral resection of bladder tumor  03/25/2011    Procedure: TRANSURETHRAL RESECTION OF BLADDER TUMOR (TURBT);  Surgeon: Claybon Jabs, MD;  Location[facility];  Service: Urology;  Laterality: N/A;    Current Outpatient Prescriptions  Medication Sig Dispense Refill  . atorvastatin (LIPITOR) 40 MG tablet Take 40 mg by mouth every evening.      . furosemide (LASIX) 20 MG tablet Take 1 tablet (20 mg total) by mouth daily.  14 tablet  0  . levothyroxine (SYNTHROID, LEVOTHROID) 137 MCG tablet Take 137 mcg by mouth daily before breakfast.      . Menthol-Methyl Salicylate (MUSCLE RUB) 10-15 % CREA Apply 1 application topically as needed for muscle pain.      . ramipril (ALTACE) 5 MG capsule Take 5 mg by mouth daily.      . Rivaroxaban (XARELTO) 20 MG TABS tablet Take 20 mg by mouth daily with supper.      . sulfamethoxazole-trimethoprim (BACTRIM DS) 800-160 MG per tablet       . verapamil (CALAN-SR) 240 MG CR tablet Take 240 mg by mouth at bedtime.       No current facility-administered medications for this visit.    Allergies:   Dabigatran etexilate mesylate   Social History:  The patient  reports that he quit smoking about 23 years ago. His smoking use included Cigarettes. He smoked 0.00 packs per day for 30 years. He has never used smokeless tobacco. He reports that he drinks alcohol. He reports that he does not use illicit drugs.   Family History:  The patient's family history includes Alcohol abuse (age of onset: 67) in his sister; Diabetes in his mother; Heart disease in his mother; Hyperlipidemia  in his mother; Hypertension in his mother; Obesity (age of onset: 54) in his mother; Other in his father. There is no history of Colon cancer, Prostate cancer, or Anesthesia problems.   ROS:  Please see the history of present illness.      All other systems reviewed and negative.   PHYSICAL EXAM: VS:  BP 137/73  Pulse 83  Ht 6' (1.829 m)  Wt 243 lb (110.224 kg)  BMI 32.95 kg/m2 Well nourished, well developed, in no acute distress HEENT: normal Neck: no JVD Cardiac:  normal S1, S2; irregularly irregular rhythm; no murmur  Lungs:  clear to auscultation bilaterally, no wheezing, rhonchi or rales Abd: soft, nontender, no hepatomegaly Ext: no edema Skin: warm and dry Neuro:  CNs 2-12 intact, no focal abnormalities noted  EKG:  Atrial fibrillation, HR 83, no ST changes     ASSESSMENT AND PLAN:  1. Dyspnea: He has had some increased dyspnea. He had an elevated BNP in the emergency room. He was placed on Lasix. He has not noticed much of a difference since that time. His weight is increased since last visit. Of note, he did increase fluids quite a bit after his recent cystoscopy with Dr. Karsten Ro.  Question if this was enough of a fluid challenge to explain an elevated BNP.  However, he does not look particularly volume overloaded on exam. I will obtain a follow up echocardiogram. I will repeat a basic metabolic panel and BNP today.  If his BNP remains elevated, I will increase his Lasix. I suspect a lot of his symptoms are probably related to his urinary tract infection as well as his intolerance to Xarelto. I have encouraged him to continue treatment with antibiotics until they are complete. We will change him back to Eliquis. 2. CAD:  No angina. Continue statin. He is not on aspirin as he is on anticoagulation. 3. Atrial Fibrillation:  Rate controlled. He had hematuria on Pradaxa. He is having symptoms of intolerance to Xarelto.  He stopped the medication on his own with resolution of symptoms. We  will try to arrange prior authorization with his insurance company. I will also give him samples of Eliquis today. He will start back on Eliquis 5 mg twice a day. 4. UTI:  Continue Bactrim until complete and follow up with urology. 5. Bladder Cancer:  F/u with urology as planned. 6. AAA, s/p Repair:  F/u with [facility]. 7. Chronic Kidney Disease:  Repeat BMET today.  8. Hypertension:  Controlled.  9. Hyperlipidemia:  Continue statin.  F/u L/L pending.  10. Disposition:  F/u with Dr. Loralie Champagne or me in 2-3 weeks.   Signed, Richardson Dopp, PA-C  04/02/2013 9:17 AM

## 2013-04-04 ENCOUNTER — Other Ambulatory Visit (INDEPENDENT_AMBULATORY_CARE_PROVIDER_SITE_OTHER): Payer: Medicare Other

## 2013-04-04 ENCOUNTER — Ambulatory Visit (HOSPITAL_COMMUNITY)
Admission: RE | Admit: 2013-04-04 | Discharge: 2013-04-04 | Disposition: A | Discharge status: Home / Self Care | Payer: Medicare Other | Source: Ambulatory Visit | Attending: Cardiovascular Disease | Admitting: Cardiovascular Disease

## 2013-04-04 ENCOUNTER — Encounter: Payer: Self-pay | Admitting: Physician Assistant

## 2013-04-04 ENCOUNTER — Telehealth: Payer: Self-pay | Admitting: *Deleted

## 2013-04-04 DIAGNOSIS — E785 Hyperlipidemia, unspecified: Secondary | ICD-10-CM

## 2013-04-04 DIAGNOSIS — I059 Rheumatic mitral valve disease, unspecified: Secondary | ICD-10-CM | POA: Insufficient documentation

## 2013-04-04 DIAGNOSIS — I379 Nonrheumatic pulmonary valve disorder, unspecified: Secondary | ICD-10-CM | POA: Diagnosis not present

## 2013-04-04 DIAGNOSIS — I4891 Unspecified atrial fibrillation: Secondary | ICD-10-CM | POA: Diagnosis not present

## 2013-04-04 DIAGNOSIS — I079 Rheumatic tricuspid valve disease, unspecified: Secondary | ICD-10-CM | POA: Insufficient documentation

## 2013-04-04 DIAGNOSIS — I10 Essential (primary) hypertension: Secondary | ICD-10-CM

## 2013-04-04 DIAGNOSIS — I2510 Atherosclerotic heart disease of native coronary artery without angina pectoris: Secondary | ICD-10-CM

## 2013-04-04 DIAGNOSIS — I369 Nonrheumatic tricuspid valve disorder, unspecified: Secondary | ICD-10-CM | POA: Diagnosis not present

## 2013-04-04 DIAGNOSIS — R0602 Shortness of breath: Secondary | ICD-10-CM

## 2013-04-04 LAB — LIPID PANEL
CHOLESTEROL: 106 mg/dL (ref 0–200)
HDL: 19 mg/dL — ABNORMAL LOW (ref >39.00)
LDL Cholesterol: 66 mg/dL (ref 0–99)
TRIGLYCERIDES: 103 mg/dL (ref 0.0–149.0)
Total CHOL/HDL Ratio: 6
VLDL: 20.6 mg/dL (ref 0.0–40.0)

## 2013-04-04 LAB — HEPATIC FUNCTION PANEL
ALBUMIN: 3 g/dL — AB (ref 3.5–5.2)
ALK PHOS: 144 U/L — AB (ref 39–117)
ALT: 66 U/L — AB (ref 0–53)
AST: 46 U/L — AB (ref 0–37)
BILIRUBIN DIRECT: 0.2 mg/dL (ref 0.0–0.3)
TOTAL PROTEIN: 7.4 g/dL (ref 6.0–8.3)
Total Bilirubin: 0.7 mg/dL (ref 0.3–1.2)

## 2013-04-04 MED ORDER — METOPROLOL SUCCINATE ER 100 MG PO TB24: 100.0000 mg | ORAL_TABLET | Freq: Every day | ORAL | Status: DC

## 2013-04-04 NOTE — Progress Notes (Signed)
2D Echo Performed 04/04/2013    Macaila Tahir, RCS  

## 2013-04-04 NOTE — Telephone Encounter (Signed)
lmptcb for echo results and recommendations to schedule lexiscan due to lower EF r/o progression of CAD.

## 2013-04-04 NOTE — Telephone Encounter (Signed)
°  Patient is returning your call, please call back.  °

## 2013-04-05 ENCOUNTER — Telehealth: Payer: Self-pay | Admitting: *Deleted

## 2013-04-05 NOTE — Telephone Encounter (Signed)
pt advised by phone about myoview instructions and a copy of instrucutions was also mailed out to him today. Pt vebalized understanding to instructions.

## 2013-04-08 ENCOUNTER — Other Ambulatory Visit: Payer: Self-pay | Admitting: *Deleted

## 2013-04-08 DIAGNOSIS — R748 Abnormal levels of other serum enzymes: Secondary | ICD-10-CM

## 2013-04-09 ENCOUNTER — Ambulatory Visit (HOSPITAL_COMMUNITY): Payer: Medicare Other | Attending: Internal Medicine | Admitting: Radiology

## 2013-04-09 ENCOUNTER — Other Ambulatory Visit: Payer: Medicare Other

## 2013-04-09 VITALS — BP 122/83 | HR 91 | Ht 72.0 in | Wt 230.0 lb

## 2013-04-09 DIAGNOSIS — I10 Essential (primary) hypertension: Secondary | ICD-10-CM | POA: Insufficient documentation

## 2013-04-09 DIAGNOSIS — I2510 Atherosclerotic heart disease of native coronary artery without angina pectoris: Secondary | ICD-10-CM | POA: Insufficient documentation

## 2013-04-09 DIAGNOSIS — Z951 Presence of aortocoronary bypass graft: Secondary | ICD-10-CM | POA: Insufficient documentation

## 2013-04-09 DIAGNOSIS — R0609 Other forms of dyspnea: Secondary | ICD-10-CM | POA: Diagnosis not present

## 2013-04-09 DIAGNOSIS — Z87891 Personal history of nicotine dependence: Secondary | ICD-10-CM | POA: Diagnosis not present

## 2013-04-09 DIAGNOSIS — R0989 Other specified symptoms and signs involving the circulatory and respiratory systems: Principal | ICD-10-CM | POA: Insufficient documentation

## 2013-04-09 DIAGNOSIS — R5381 Other malaise: Secondary | ICD-10-CM | POA: Insufficient documentation

## 2013-04-09 DIAGNOSIS — E785 Hyperlipidemia, unspecified: Secondary | ICD-10-CM | POA: Insufficient documentation

## 2013-04-09 DIAGNOSIS — J449 Chronic obstructive pulmonary disease, unspecified: Secondary | ICD-10-CM | POA: Diagnosis not present

## 2013-04-09 DIAGNOSIS — R0602 Shortness of breath: Secondary | ICD-10-CM | POA: Diagnosis not present

## 2013-04-09 DIAGNOSIS — R5383 Other fatigue: Secondary | ICD-10-CM | POA: Diagnosis not present

## 2013-04-09 DIAGNOSIS — J4489 Other specified chronic obstructive pulmonary disease: Secondary | ICD-10-CM | POA: Insufficient documentation

## 2013-04-09 MED ORDER — REGADENOSON 0.4 MG/5ML IV SOLN
0.4000 mg | Freq: Once | INTRAVENOUS | Status: AC
  Administered 2013-04-09: 0.4 mg via INTRAVENOUS

## 2013-04-09 MED ORDER — TECHNETIUM TC 99M SESTAMIBI GENERIC - CARDIOLITE
33.0000 | Freq: Once | INTRAVENOUS | Status: AC | PRN
  Administered 2013-04-09: 33 via INTRAVENOUS

## 2013-04-09 MED ORDER — TECHNETIUM TC 99M SESTAMIBI GENERIC - CARDIOLITE
11.0000 | Freq: Once | INTRAVENOUS | Status: AC | PRN
  Administered 2013-04-09: 11 via INTRAVENOUS

## 2013-04-09 NOTE — Progress Notes (Signed)
[facility] [address] [place], [place] [address] [phone]    Cardiology Nuclear Med Study  [name] is a 80 y.o. male     MRN : [id number]     DOB: [birth year]  Procedure Date: 04/09/2013  Nuclear Med Background Indication for Stress Test:  Evaluation for Ischemia, Graft Patency and Decreased EF on recent ECHO History:  CAD, Cath, CABG x7 in 1992, Echo 2015 40-45%, MPI 2012 (scart) 55-65%, COPD, AAA repair Cardiac Risk Factors: History of Smoking, Hypertension and Lipids  Symptoms:  DOE and weakness   Nuclear Pre-Procedure Caffeine/Decaff Intake:  None NPO After: 7:00pm   Lungs:  clear O2 Sat: 96% on room air. IV 0.9% NS with Angio Cath:  22g  IV Site: R Hand  IV Started by:  Matilde Haymaker, RN  Chest Size (in):  46 Cup Size: n/a  Height: 6' (1.829 m)  Weight:  230 lb (104.327 kg)  BMI:  Body mass index is 31.19 kg/(m^2). Tech Comments:  No Toprol x 12 hrs    Nuclear Med Study 1 or 2 day study: 1 day  Stress Test Type:  Treadmill/Lexiscan  Reading MD: n/a  Order Authorizing Provider:  Theador Hawthorne  Resting Radionuclide: Technetium 46m Sestamibi  Resting Radionuclide Dose: 10.9 mCi   Stress Radionuclide:  Technetium 88m Sestamibi  Stress Radionuclide Dose: 33.0 mCi           Stress Protocol Rest HR: 91 Stress HR: 134  Rest BP: 122/83 Stress BP: 131/79  Exercise Time (min): n/a METS: n/a           Dose of Adenosine (mg):  n/a Dose of Lexiscan: 0.4 mg  Dose of Atropine (mg): n/a Dose of Dobutamine: n/a mcg/kg/min (at max HR)  Stress Test Technologist: Glade Lloyd, BS-ES  Nuclear Technologist:  Annye Rusk, CNMT     Rest Procedure:  Myocardial perfusion imaging was performed at rest 45 minutes following the intravenous administration of Technetium 50m Sestamibi. Rest ECG: Afib with PVC  Stress Procedure:  The patient received IV Lexiscan 0.4 mg over 15-seconds with concurrent low level exercise and then  Technetium 48m Sestamibi was injected at 30-seconds while the patient continued walking one more minute.  Quantitative spect images were obtained after a 45-minute delay.  During the infusion of Lexiscan, the patient complained of slight wooziness.  This resolved in recovery.  Stress ECG: No significant change from baseline ECG  QPS Raw Data Images:  Patient motion noted. Stress Images:  Normal homogeneous uptake in all areas of the myocardium. Rest Images:  Normal homogeneous uptake in all areas of the myocardium. Subtraction (SDS):  No evidence of ischemia. Transient Ischemic Dilatation (Normal <1.22):  0.91 Lung/Heart Ratio (Normal <0.45):  0.38  Quantitative Gated Spect Images QGS EDV:  NA QGS ESV:  NA  Impression Exercise Capacity:  Lexiscan with low level exercise. BP Response:  Normal blood pressure response. Clinical Symptoms:  No significant symptoms noted. ECG Impression:  No significant ST segment change suggestive of ischemia. Comparison with Prior Nuclear Study: No images to compare  Overall Impression:  Normal stress nuclear study. The RV appears dilated  No gating due to afib  LV Ejection Fraction: Study not gated.  LV Wall Motion:  NA  Jenkins Rouge

## 2013-04-10 ENCOUNTER — Encounter: Payer: Self-pay | Admitting: Physician Assistant

## 2013-04-10 ENCOUNTER — Telehealth: Payer: Self-pay | Admitting: *Deleted

## 2013-04-10 ENCOUNTER — Encounter: Payer: Self-pay | Admitting: Cardiology

## 2013-04-10 NOTE — Telephone Encounter (Signed)
ptcb and has been notified about myoview results with verbal understanding

## 2013-04-10 NOTE — Telephone Encounter (Signed)
lmptcb on both home and cell #'s for myoview results

## 2013-04-11 ENCOUNTER — Ambulatory Visit (INDEPENDENT_AMBULATORY_CARE_PROVIDER_SITE_OTHER): Payer: Medicare Other | Admitting: Physician Assistant

## 2013-04-11 ENCOUNTER — Encounter: Payer: Self-pay | Admitting: Physician Assistant

## 2013-04-11 VITALS — BP 143/84 | HR 74 | Ht 72.0 in | Wt 235.0 lb

## 2013-04-11 DIAGNOSIS — N189 Chronic kidney disease, unspecified: Secondary | ICD-10-CM

## 2013-04-11 DIAGNOSIS — E785 Hyperlipidemia, unspecified: Secondary | ICD-10-CM

## 2013-04-11 DIAGNOSIS — I428 Other cardiomyopathies: Secondary | ICD-10-CM

## 2013-04-11 DIAGNOSIS — I4891 Unspecified atrial fibrillation: Secondary | ICD-10-CM

## 2013-04-11 DIAGNOSIS — I429 Cardiomyopathy, unspecified: Secondary | ICD-10-CM

## 2013-04-11 DIAGNOSIS — I2510 Atherosclerotic heart disease of native coronary artery without angina pectoris: Secondary | ICD-10-CM | POA: Diagnosis not present

## 2013-04-11 DIAGNOSIS — I10 Essential (primary) hypertension: Secondary | ICD-10-CM

## 2013-04-11 MED ORDER — RAMIPRIL 5 MG PO CAPS: 5.0000 mg | ORAL_CAPSULE | Freq: Two times a day (BID) | ORAL | Status: DC

## 2013-04-11 NOTE — Progress Notes (Signed)
[address] [place], [place]  [address] Phone: [phone] Fax:  [phone]  Date:  04/11/2013   ID:  [name], DOB [birth year], MRN [id number]  PCP:  Elsie Stain, MD  Cardiologist:  Dr.  Bing Quarry => Dr. Loralie Champagne    History of Present Illness: [name] is a 80 y.o. male with a hx of chronic atrial fibrillation, CAD s/p CABG, AAA repair, and CKD. Patient had CABG in 1992 and has not had a cardiac cath since that time.  He has had chronic atrial fibrillation for 2-3 years. He is on apixaban.  Myoview (01/2011): Fixed inferior defect suggestive of prior MI versus diaphragmatic attenuation, apical reversibility possibly suspicious for small area of infarction with peri-infarct ischemia, no significant change from prior study, not gated, low risk study.  Echo (05/2010): Moderate LVH, EF 55-65%, mild AI, mild MR, moderate LAE, mild RAE, moderate to severe TR, PASP 45.  He established with Dr. Aundra Dubin 11/2012.   Patient had recently been seen in the emergency room for weakness, dyspnea and a fall. Urine culture was positive and he was placed on antibiotics. His insurance company had also changed Eliquis to [place]. He felt he was having adverse effects from this and stopped it on his own. I saw him 04/02/13. He continued to note some increased dyspnea. He had been placed on Lasix in the emergency room without much improvement in his symptoms.  I suspected a lot of his symptoms were related to his UTI. I switched him back to Eliquis for anticoagulation.  I obtained an echocardiogram which demonstrated worsening LV function with an EF of 40-45% and inferior and inferolateral akinesis. Myoview study was also performed which demonstrated no ischemia.   Echo (03/2013): Inferior and inferolateral AK, EF 40-45%, mild MR, mod LAE, mod RAE, mod TR, mild PI, PASP 46 Lexiscan Myoview (03/2013):  No ischemia; not gated.  He returns for follow up.  He states he feels the best he  has felt in a long time.  He denies chest pain, dyspnea, syncope, orthopnea, PND, edema.  He has more energy and denies any significant fatigue.    Recent Labs: 02/07/2013: TSH 3.04  03/29/2013: Hemoglobin 12.7*  04/02/2013: Creatinine 1.4; Potassium 3.6; Pro B Natriuretic peptide (BNP) 127.0*  04/04/2013: ALT 66*; HDL Cholesterol 19.00*; LDL (calc) 66   Wt Readings from Last 3 Encounters:  04/09/13 230 lb (104.327 kg)  04/02/13 243 lb (110.224 kg)  03/29/13 234 lb (106.142 kg)     Past Medical History  Diagnosis Date  . Hyperglycemia diet controlled  . Obesity   . Hyperlipidemia   . Organic impotence   . Hypertension   . Hiatal hernia     repaired in 1992  . Bladder tumor     s/p excision  . Hematuria bladder tumor ---  followed by Dr Karsten Ro  . S/P CABG x 7 1992  . S/P AAA repair using bifurcation graft   . Sleep apnea no rx cpap    mild- tested  2012  . Coronary artery disease s/p cabg x7  1992    a. s/p CABG in 1992 (X 7 - Dr. Redmond Pulling);  b.  Myoview (01/2011): Fixed inferior defect suggestive of prior MI versus diaphragmatic attenuation, apical reversibility possibly suspicious for small area of infarction with peri-infarct ischemia, no significant change from prior study, not gated, low risk study;  c.  Echo (05/2010): Mod LVH, EF 55-65%, mild AI, mild MR, moderate LAE,  mild RAE, moderate   . Ventricular ectopy     mild  . Atrial fibrillation     a. permanent;  b. Hematuria on Pradaxa;  c. cold intol and chills with Xarelto  . AAA (abdominal aortic aneurysm) 04/29/11    s/p stent graft repair 2012  . Chronic kidney disease   . Hypothyroidism   . COPD (chronic obstructive pulmonary disease)   . Cardiomyopathy     a. Echo (03/2013): Inferior and inferolateral AK, EF 40-45%, mild MR, mod LAE, mod RAE, mod TR, mild PI, PASP 46  . Hx of cardiovascular stress test     Lexiscan Myoview (03/2013):  No ischemia; not gated.    Past Surgical History  Procedure Laterality Date  .  Coronary artery bypass graft  05-1990    x7   . Cardiac catheterization  03-1990    Positive  . Ett  10/31/00    WNL  . [place]  03/28/02  . Endovascular stent insertion  02/15/2011    Procedure: ENDOVASCULAR STENT GRAFT INSERTION;  Surgeon: Hinda Lenis, MD;  Location: [facility];  Service: Vascular;  Laterality: N/A;  Insertion of endovascular stent graft   . Cardiovascular stress test  02-01-2011  NUCLEAR NO EXERCISE    LOW RISK STUDY.  SMALL APICAL DEFECT. NO EVIDENCE OF MULTIZONE ISCHEMIA.  . Inguinal hernia repair  1997    left  . Transurethral resection of bladder tumor  03/25/2011    Procedure: TRANSURETHRAL RESECTION OF BLADDER TUMOR (TURBT);  Surgeon: Claybon Jabs, MD;  Location: [facility];  Service: Urology;  Laterality: N/A;    Current Outpatient Prescriptions  Medication Sig Dispense Refill  . apixaban (ELIQUIS) 5 MG TABS tablet Take 1 tablet (5 mg total) by mouth 2 (two) times daily.  60 tablet  11  . atorvastatin (LIPITOR) 40 MG tablet Take 40 mg by mouth every evening.      . furosemide (LASIX) 20 MG tablet Take 1 tablet (20 mg total) by mouth daily.  14 tablet  0  . levothyroxine (SYNTHROID, LEVOTHROID) 137 MCG tablet Take 137 mcg by mouth daily before breakfast.      . Menthol-Methyl Salicylate (MUSCLE RUB) 10-15 % CREA Apply 1 application topically as needed for muscle pain.      . metoprolol succinate (TOPROL-XL) 100 MG 24 hr tablet Take 1 tablet (100 mg total) by mouth daily. Take with or immediately following a meal.  30 tablet  11  . ramipril (ALTACE) 5 MG capsule Take 5 mg by mouth daily.      Marland Kitchen sulfamethoxazole-trimethoprim (BACTRIM DS) 800-160 MG per tablet        No current facility-administered medications for this visit.    Allergies:   Xarelto and Dabigatran etexilate mesylate   Social History:  The patient  reports that he quit smoking about 23 years ago. His smoking use included Cigarettes. He smoked 0.00 packs per day for 30  years. He has never used smokeless tobacco. He reports that he drinks alcohol. He reports that he does not use illicit drugs.   Family History:  The patient's family history includes Alcohol abuse (age of onset: 19) in his sister; Diabetes in his mother; Heart disease in his mother; Hyperlipidemia in his mother; Hypertension in his mother; Obesity (age of onset: 52) in his mother; Other in his father. There is no history of Colon cancer, Prostate cancer, or Anesthesia problems.   ROS:  Please see the history of present  illness.    All other systems reviewed and negative.   PHYSICAL EXAM: VS:  BP 143/84  Pulse 74  Ht 6' (1.829 m)  Wt 235 lb (106.595 kg)  BMI 31.86 kg/m2 Well nourished, well developed, in no acute distress HEENT: normal Neck: no JVD Cardiac:  normal S1, S2; irregularly irregular rhythm; no murmur Lungs:  clear to auscultation bilaterally, no wheezing, rhonchi or rales Abd: soft, nontender, no hepatomegaly Ext: no edema Skin: warm and dry Neuro:  CNs 2-12 intact, no focal abnormalities noted  EKG:   AFib, HR 74    ASSESSMENT AND PLAN:  1. Ischemic Cardiomyopathy:  EF is now 40-45% with inf and inf-lat AK.  The WMA is fairly c/w prior nuclear study findings.  Recent Myoview demonstrates no ischemia.  Volume is stable.  He is feeling well without chest pain or dyspnea.  Question if low EF related to Verapamil.  He is now on Toprol.  Will adjust Ramipril to 5 BID.  Check BMET in 1 week.  No further workup indicated at this time.  2. CAD:  Recent myoview without ischemia. Continue statin. He is not on aspirin as he is on anticoagulation. 3. Atrial Fibrillation:  Rate controlled.  He is not having any side effects to Eliquis.  4. UTI:  Resolved.  5. Bladder Cancer:  F/u with urology as planned. 6. AAA, s/p Repair:  F/u with [facility]. 7. Chronic Kidney Disease:  Repeat BMET in 1 week with adjustment in ACEI.   8. Hypertension:  Controlled.  9. Hyperlipidemia:  Continue statin.   He is set for repeat LFTs next week due to mildly elevated LFTs.  10. Disposition:  F/u with Dr. Loralie Champagne in 2 mos.    Signed, Richardson Dopp, PA-C  04/11/2013 9:03 AM

## 2013-04-11 NOTE — Patient Instructions (Signed)
Your physician has recommended you make the following change in your medication: 1. Increase Ramipril to 5 MG 1 tablet Twice a day.  Your physician recommends that you return for lab work on 04/18/13 for your fasting panel and BMET  Your physician recommends that you schedule a follow-up appointment in: 2 Months with Dr. Aundra Dubin

## 2013-04-18 ENCOUNTER — Other Ambulatory Visit: Payer: Medicare Other

## 2013-04-19 ENCOUNTER — Other Ambulatory Visit: Payer: Medicare Other

## 2013-04-22 ENCOUNTER — Other Ambulatory Visit (INDEPENDENT_AMBULATORY_CARE_PROVIDER_SITE_OTHER): Payer: Medicare Other

## 2013-04-22 DIAGNOSIS — Z48812 Encounter for surgical aftercare following surgery on the circulatory system: Secondary | ICD-10-CM

## 2013-04-22 DIAGNOSIS — I714 Abdominal aortic aneurysm, without rupture, unspecified: Secondary | ICD-10-CM

## 2013-04-22 DIAGNOSIS — R748 Abnormal levels of other serum enzymes: Secondary | ICD-10-CM | POA: Diagnosis not present

## 2013-04-22 DIAGNOSIS — I4891 Unspecified atrial fibrillation: Secondary | ICD-10-CM

## 2013-04-22 LAB — HEPATIC FUNCTION PANEL
ALT: 14 U/L (ref 0–53)
AST: 14 U/L (ref 0–37)
Albumin: 3.4 g/dL — ABNORMAL LOW (ref 3.5–5.2)
Alkaline Phosphatase: 75 U/L (ref 39–117)
Bilirubin, Direct: 0 mg/dL (ref 0.0–0.3)
Total Bilirubin: 0.6 mg/dL (ref 0.3–1.2)
Total Protein: 7.2 g/dL (ref 6.0–8.3)

## 2013-04-22 LAB — BASIC METABOLIC PANEL
BUN: 14 mg/dL (ref 6–23)
CALCIUM: 9.1 mg/dL (ref 8.4–10.5)
CO2: 27 mEq/L (ref 19–32)
CREATININE: 1 mg/dL (ref 0.4–1.5)
Chloride: 107 mEq/L (ref 96–112)
GFR: 76.1 mL/min (ref >60.00)
GLUCOSE: 93 mg/dL (ref 70–99)
Potassium: 4.3 mEq/L (ref 3.5–5.1)
SODIUM: 141 meq/L (ref 135–145)

## 2013-05-02 ENCOUNTER — Encounter: Payer: Self-pay | Admitting: Vascular Surgery

## 2013-05-03 ENCOUNTER — Other Ambulatory Visit: Payer: Self-pay | Admitting: Vascular Surgery

## 2013-05-03 ENCOUNTER — Ambulatory Visit
Admission: RE | Admit: 2013-05-03 | Discharge: 2013-05-03 | Disposition: A | Discharge status: Home / Self Care | Payer: Medicare Other | Source: Ambulatory Visit | Attending: Vascular Surgery | Admitting: Vascular Surgery

## 2013-05-03 ENCOUNTER — Encounter: Payer: Self-pay | Admitting: Vascular Surgery

## 2013-05-03 ENCOUNTER — Ambulatory Visit (INDEPENDENT_AMBULATORY_CARE_PROVIDER_SITE_OTHER): Payer: Medicare Other | Admitting: Vascular Surgery

## 2013-05-03 VITALS — BP 183/78 | HR 75 | Resp 16 | Ht 72.0 in | Wt 241.8 lb

## 2013-05-03 DIAGNOSIS — I714 Abdominal aortic aneurysm, without rupture, unspecified: Secondary | ICD-10-CM

## 2013-05-03 DIAGNOSIS — Z48812 Encounter for surgical aftercare following surgery on the circulatory system: Secondary | ICD-10-CM

## 2013-05-03 DIAGNOSIS — I7409 Other arterial embolism and thrombosis of abdominal aorta: Secondary | ICD-10-CM | POA: Diagnosis not present

## 2013-05-03 LAB — BUN: BUN: 11 mg/dL (ref 6–23)

## 2013-05-03 LAB — CREATININE, SERUM: Creat: 0.9 mg/dL (ref 0.50–1.35)

## 2013-05-03 MED ORDER — IOHEXOL 350 MG/ML SOLN
80.0000 mL | Freq: Once | INTRAVENOUS | Status: AC | PRN
  Administered 2013-05-03: 80 mL via INTRAVENOUS

## 2013-05-03 NOTE — Progress Notes (Signed)
    Established EVAR  History of Present Illness  [name] is a 80 y.o. (28-Apr-1933) male who presents for routine follow up s/p EVAR (Date: 02/15/11).  Most recent EVAR duplex (Date: 11/04/11) demonstrates: no endoleak and stable sac size.  Most recent CTA (Date: 05/03/2013) demonstrates: no endoleak and stable decrease sac size.  The patient has not had back or abdominal pain.  The patient's PMH, PSH, SH, FamHx, Med, and Allergies are unchanged from 11/03/11.  On ROS today: no intermittent claudication, no back or abd pain  Physical Examination  Filed Vitals:   05/03/13 1229  BP: 183/78  Pulse: 75  Resp: 16  Height: 6' (1.829 m)  Weight: 241 lb 12.8 oz (109.68 kg)   Body mass index is 32.79 kg/(m^2).  General: A&O x 3, WD, Obese,   Pulmonary: Sym exp, good air movt, CTAB, no rales, rhonchi, & wheezing  Cardiac: RRR, Nl S1, S2, no Murmurs, rubs or gallops  Vascular: Vessel Right Left  Aorta Not palpable N/A  Femoral Palpable Palpable  Popliteal Not palpable Not palpable  PT Not Palpable Not Palpable  DP Not Palpable Not Palpable   Gastrointestinal: soft, NTND, -G/R, - HSM, - masses, - CVAT B  Musculoskeletal: M/S 5/5 throughout , Extremities without ischemic changes   Neurologic: Pain and light touch intact in extremities , Motor exam as listed above  Non-Invasive Vascular Imaging  CTA Abd/Pelvis Duplex (Date: 05/03/2013 )  Stable patency and positioning of aortic endograft with no evidence of endoleak following EVAR. Thrombosed aneurysm sac shows decrease in size since prior CTA with maximal diameter of 6.4 cm.   No endoleak is identified by CTA.  Based on my review of the CTA, the endograft is in good position with no evidence of migration.  The sac appears thrombosed with no endoleak evident.  There is no evidence of limb dysfunction.  Medical Decision Making  [name] is a 80 y.o. male who presents s/p EVAR.  Pt is asymptomatic with stable decreased  sac size.  I discussed with the patient the importance of surveillance of the endograft.  The next endograft duplex will be scheduled for 12 months.  The next CTA will be scheduled at 5 years after repair.  The patient will follow up with [place] in 12 months with this studies.  Thank you for allowing [place] to participate in this patient's care.  Adele Barthel, MD [facility] Office: [phone] Pager: [phone]  05/03/2013, 12:50 PM

## 2013-05-06 NOTE — Addendum Note (Signed)
Addended by: Thresa Ross C on: 05/06/2013 02:19 PM   Modules accepted: Orders

## 2013-05-16 ENCOUNTER — Telehealth: Payer: Self-pay | Admitting: Cardiology

## 2013-05-16 DIAGNOSIS — N453 Epididymo-orchitis: Secondary | ICD-10-CM | POA: Diagnosis not present

## 2013-05-16 DIAGNOSIS — R300 Dysuria: Secondary | ICD-10-CM | POA: Diagnosis not present

## 2013-05-16 NOTE — Telephone Encounter (Signed)
New message     Patient calling need to talk with nurse having problem with blood thinner.    Stop taken blood thinner this am due to urinate blood started early this am .

## 2013-05-16 NOTE — Telephone Encounter (Signed)
Dr. Aundra Dubin reviewed. Pt can stop the Eliquis for 2 weeks. Then resume & call office if bleeding reoccurs.  Pt is aware & will restart Eliquis in 2 weeks. He will call back if bleeding restarts at that time. Horton Chin RN

## 2013-05-16 NOTE — Telephone Encounter (Signed)
Pt returns call & has 11:15 am appt today with Dr. Simone Curia assistant Horton Chin RN

## 2013-05-16 NOTE — Telephone Encounter (Signed)
Stop for 2 wks and retry. Bleeding likely due to UTI. If more bleeding will stop.

## 2013-05-16 NOTE — Telephone Encounter (Signed)
Pt returns call. He has seen the Urologist & was prescribed cipro 500mg  bid x 10 days. He did not take his Eliquis this morning. States that all urinary bleeding has stopped  Pt would like Dr. Aundra Dubin to know that if it is possible he would like to stop the Eliquis alltogether & resume his Ecotrin daily.  Forwarded to Dr. Alva Garnet RN

## 2013-05-16 NOTE — Telephone Encounter (Signed)
Pt calls b/c he is experiencing bright red blood when urinating that seems to have started last pm.  States he was up every 2 hours last night & noticed the blood this morning when urinating. This frequent urination at bedtime is not normal for him.  He did not take his Eliquis this morning b/c of the bloody urine. I will forward this to Dr. Aundra Dubin about the Eliquis. Pt will call Dr. Karsten Ro as well .

## 2013-06-14 ENCOUNTER — Other Ambulatory Visit: Payer: Self-pay | Admitting: Family Medicine

## 2013-06-14 ENCOUNTER — Other Ambulatory Visit: Payer: Self-pay | Admitting: Cardiology

## 2013-06-17 ENCOUNTER — Encounter (INDEPENDENT_AMBULATORY_CARE_PROVIDER_SITE_OTHER): Payer: Self-pay

## 2013-06-17 ENCOUNTER — Encounter: Payer: Self-pay | Admitting: Cardiology

## 2013-06-17 ENCOUNTER — Ambulatory Visit (INDEPENDENT_AMBULATORY_CARE_PROVIDER_SITE_OTHER): Payer: Medicare Other | Admitting: Cardiology

## 2013-06-17 VITALS — BP 179/95 | HR 63 | Ht 72.0 in | Wt 244.0 lb

## 2013-06-17 DIAGNOSIS — I714 Abdominal aortic aneurysm, without rupture, unspecified: Secondary | ICD-10-CM

## 2013-06-17 DIAGNOSIS — I2510 Atherosclerotic heart disease of native coronary artery without angina pectoris: Secondary | ICD-10-CM

## 2013-06-17 DIAGNOSIS — I10 Essential (primary) hypertension: Secondary | ICD-10-CM

## 2013-06-17 DIAGNOSIS — E785 Hyperlipidemia, unspecified: Secondary | ICD-10-CM

## 2013-06-17 DIAGNOSIS — I255 Ischemic cardiomyopathy: Secondary | ICD-10-CM | POA: Insufficient documentation

## 2013-06-17 DIAGNOSIS — I2589 Other forms of chronic ischemic heart disease: Secondary | ICD-10-CM

## 2013-06-17 DIAGNOSIS — I4891 Unspecified atrial fibrillation: Secondary | ICD-10-CM | POA: Diagnosis not present

## 2013-06-17 DIAGNOSIS — I4821 Permanent atrial fibrillation: Secondary | ICD-10-CM

## 2013-06-17 NOTE — Patient Instructions (Signed)
Restart Eliquis 5mg  two times a day Ramipril 5mg  two times a day Metoprolol 100mg  daily.   Your physician recommends that you schedule a follow-up appointment in: 3 months with Dr Aundra Dubin.

## 2013-06-17 NOTE — Progress Notes (Signed)
Patient ID: [name], male   DOB: 01-24-1934, 80 y.o.   MRN: [id number] PCP: Dr. Damita Dunnings  80 yo with history of chronic atrial fibrillation, CAD s/p CABG, AAA repair, and CKD presents for cardiology followup.  Patient had CABG in 1992 and has not had a cardiac cath since that time.  Last echo in 2/15 showed inferior and posterior hypokinesis with EF 40-45%.  Lexiscan Cardiolite in 2/15 showed no ischemia or infarction.  He denies chest pain or exertional dyspnea.  He has had chronic atrial fibrillation for 2-3 years.  He does not feel tachypalpitations and rate is controlled.   In 1/15, he developed a UTI/prostatitis apparently after a cystoscopy.  He had some hematuria with the UTI and stopped apixaban.   He no longer has hematuria.  BP is high today but patient recently ran out of Toprol XL and ramipril.   Labs (3/14): K 4.3, creatinine 1.0, HCT 42.9 Labs (10/14): LDL 111, HDL 41 Labs (2/15): LDL 66, HDL 19, AST 46 => normal, ALT 66 => normal Labs (3/15): creatinine 0.9  PMH: 1. Chronic atrial fibrillation: Hematuria on Pradaxa.  2. CAD: CABG x 7 in 1992 (Dr. Redmond Pulling).  Lexiscan Cardiolite (2/15): not gated, no ischemia or infarction.  3. AAA repair: stent graft.  4. CKD 5. OSA: Mild 6. Hypothyroidism 7. HTN 8. Bladder tumor 9. COPD 10. Ischemic cardiomyopathy: EF 40-45% with inferior and posterior hypokinesis, PA systolic pressure 46 mmHg.   SH: Quit smoking in 1992, lives in [place] alone, widower, 3 children.   FH: No premature CAD.   ROS: All systems reviewed and negative except as per HPI.   Current Outpatient Prescriptions  Medication Sig Dispense Refill  . atorvastatin (LIPITOR) 40 MG tablet TAKE 1 TABLET BY MOUTH DAILY.  30 tablet  0  . ciprofloxacin (CIPRO) 500 MG tablet Take 500 mg by mouth 2 (two) times daily.      Marland Kitchen levothyroxine (SYNTHROID, LEVOTHROID) 137 MCG tablet Take 137 mcg by mouth daily before breakfast.      . metoprolol succinate (TOPROL-XL) 100 MG 24  hr tablet Take 1 tablet (100 mg total) by mouth daily. Take with or immediately following a meal.  30 tablet  11  . ramipril (ALTACE) 5 MG capsule Take 1 capsule (5 mg total) by mouth 2 (two) times daily.  60 capsule  11  . apixaban (ELIQUIS) 5 MG TABS tablet Take 1 tablet (5 mg total) by mouth 2 (two) times daily.  60 tablet  11   No current facility-administered medications for this visit.   BP 179/95  Pulse 63  Ht 6' (1.829 m)  Wt 110.678 kg (244 lb)  BMI 33.09 kg/m2 General: NAD Neck: JVP 8 cm, no thyromegaly or thyroid nodule.  Lungs: Clear to auscultation bilaterally with normal respiratory effort. CV: Nondisplaced PMI.  Heart irregular S1/S2, no S3/S4, no murmur.  1+ right ankle edema.  No carotid bruit.  Normal pedal pulses.  Abdomen: Soft, nontender, no hepatosplenomegaly, no distention.  Skin: Intact without lesions or rashes.  Neurologic: Alert and oriented x 3.  Psych: Normal affect. Extremities: No clubbing or cyanosis.  Varicose veins lower legs.   Assessment/Plan: 1. CAD: s/p CABG.  No ischemic symptoms.  Lexiscan Cardiolite in 2/15 showed no ischemia or infarction. Continue statin, ACEI.  He is not on aspirin because he has been on apixaban.  2. Hyperlipidemia: Good LDL on atorvastatin.  HDL remains low.  Transaminases elevated initially in 2/15 but repeat check was  normal.   3. Chronic atrial fibrillation: Rate controlled with Toprol XL.   He has been off apixaban with hematuria.  This seems to have resolved a number of weeks ago.  I will have him restart apixaban at 5 mg bid.   4. AAA stent graft repair: Follows at [facility].  5. HTN: BP high today but he has been out of Toprol XL and ramipril.  I will refill these meds.  6. Ischemic cardiomyopathy: EF 40-45% on last echo.  He may be mildly volume overloaded.  For now, will manage with sodium restriction.  Continue Toprol XL and ramipril.    Larey Dresser 06/17/2013 .

## 2013-06-26 ENCOUNTER — Ambulatory Visit (INDEPENDENT_AMBULATORY_CARE_PROVIDER_SITE_OTHER): Payer: Medicare Other | Admitting: Family Medicine

## 2013-06-26 ENCOUNTER — Encounter: Payer: Self-pay | Admitting: Family Medicine

## 2013-06-26 VITALS — BP 137/68 | HR 60 | Temp 97.7°F | Wt 248.0 lb

## 2013-06-26 DIAGNOSIS — M25579 Pain in unspecified ankle and joints of unspecified foot: Secondary | ICD-10-CM

## 2013-06-26 DIAGNOSIS — I2510 Atherosclerotic heart disease of native coronary artery without angina pectoris: Secondary | ICD-10-CM | POA: Diagnosis not present

## 2013-06-26 DIAGNOSIS — M25571 Pain in right ankle and joints of right foot: Secondary | ICD-10-CM

## 2013-06-26 DIAGNOSIS — M7670 Peroneal tendinitis, unspecified leg: Secondary | ICD-10-CM

## 2013-06-26 DIAGNOSIS — M7750 Other enthesopathy of unspecified foot: Secondary | ICD-10-CM | POA: Diagnosis not present

## 2013-06-26 DIAGNOSIS — M76829 Posterior tibial tendinitis, unspecified leg: Secondary | ICD-10-CM

## 2013-06-26 NOTE — Progress Notes (Signed)
Pre visit review using our clinic review tool, if applicable. No additional management support is needed unless otherwise documented below in the visit note. 

## 2013-06-26 NOTE — Progress Notes (Signed)
[place] [address] Phone: [phone] Fax: [phone]  Patient ID: [name] MRN: [id number], DOB: March 23, 1933, 80 y.o. Date of Encounter: 06/26/2013  Primary Physician:  Elsie Stain, MD   Chief Complaint: Ankle Pain   Subjective:   History of Present Illness:  [name] is a 80 y.o. pleasant patient who presents with the following:  The patient has had intermittent ankle pain ongoing for at least multiple months to years. He denies any specific trauma or fracture to the affected ankle. This is all on the RIGHT side.  He has some pain of problem and the true ankle area as well as medially and light laterally, particularly as up and active. His wife did pass away and earlier of 2014.  He has multiple medical problems, and he is also on antiplatelet medication for atrial fibrillation. Multiple other medical problems including cardiac are detailed below.  He denies any tingling or numbness associated of the affected foot. ABI's in 01/2011 were perfectly normal.   Patient Active Problem List   Diagnosis Date Noted  . Cardiomyopathy, ischemic 2013/06/27  . Death of wife 22-Dec-2012  . AAA (abdominal aortic aneurysm) without rupture 11/02/2012  . Atrial fibrillation, permanent 02/14/2012  . Medicare annual wellness visit, initial 12/30/2011  . Bladder cancer 07/29/2011  . Abdominal aneurysm without mention of rupture 04/29/2011  . Intermittent claudication 04/29/2011  . AAA (abdominal aortic aneurysm) 02/01/2011  . Colon cancer screening 12/29/2010  . Hyperlipidemia 06/13/2010  . ORGANIC IMPOTENCE 04/29/2010  . HYPOTHYROIDISM NOS 06/29/2006  . OBESITY 06/29/2006  . HYPERTENSION 06/29/2006  . CORONARY ARTERY DISEASE 06/29/2006  . PAROXYSMAL ATRIAL FIBRILLATION 06/29/2006  . HYPERGLYCEMIA 06/29/2006    Past Medical History  Diagnosis Date  . Hyperglycemia diet controlled  . Obesity   . Hyperlipidemia   . Organic impotence   .  Hypertension   . Hiatal hernia     repaired in 1992  . Bladder tumor     s/p excision  . Hematuria bladder tumor ---  followed by Dr Karsten Ro  . S/P CABG x 7 1992  . S/P AAA repair using bifurcation graft   . Sleep apnea no rx cpap    mild- tested  2012  . Coronary artery disease s/p cabg x7  1992    a. s/p CABG in 1992 (X 7 - Dr. Redmond Pulling);  b.  Myoview (01/2011): Fixed inferior defect suggestive of prior MI versus diaphragmatic attenuation, apical reversibility possibly suspicious for small area of infarction with peri-infarct ischemia, no significant change from prior study, not gated, low risk study;  c.  Echo (05/2010): Mod LVH, EF 55-65%, mild AI, mild MR, moderate LAE, mild RAE, moderate   . Ventricular ectopy     mild  . Atrial fibrillation     a. permanent;  b. Hematuria on Pradaxa;  c. cold intol and chills with Xarelto  . AAA (abdominal aortic aneurysm) 04/29/11    s/p stent graft repair 2012  . Chronic kidney disease   . Hypothyroidism   . COPD (chronic obstructive pulmonary disease)   . Cardiomyopathy     a. Echo (03/2013): Inferior and inferolateral AK, EF 40-45%, mild MR, mod LAE, mod RAE, mod TR, mild PI, PASP 46  . Hx of cardiovascular stress test     Lexiscan Myoview (03/2013):  No ischemia; not gated.    Past Surgical History  Procedure Laterality Date  . Coronary artery bypass graft  05-1990    x7   .  Cardiac catheterization  03-1990    Positive  . [place]  10/31/00    WNL  . [place]  03/28/02  . Endovascular stent insertion  02/15/2011    Procedure: ENDOVASCULAR STENT GRAFT INSERTION;  Surgeon: Hinda Lenis, MD;  Location: [facility];  Service: Vascular;  Laterality: N/A;  Insertion of endovascular stent graft   . Cardiovascular stress test  02-01-2011  NUCLEAR NO EXERCISE    LOW RISK STUDY.  SMALL APICAL DEFECT. NO EVIDENCE OF MULTIZONE ISCHEMIA.  . Inguinal hernia repair  1997    left  . Transurethral resection of bladder tumor  03/25/2011    Procedure:  TRANSURETHRAL RESECTION OF BLADDER TUMOR (TURBT);  Surgeon: Claybon Jabs, MD;  Location: [facility];  Service: Urology;  Laterality: N/A;    History   Social History  . Marital Status: Married    Spouse Name: N/A    Number of Children: N/A  . Years of Education: N/A   Occupational History  . retired - Merchant [employer] officer.     2001. Likes to play golf . Travels with motor home   Social History Main Topics  . Smoking status: Former Smoker -- 30 years    Types: Cigarettes    Quit date: 02/28/1990  . Smokeless tobacco: Never Used  . Alcohol Use: Yes     Comment: rare  . Drug Use: No  . Sexual Activity: Not on file   Other Topics Concern  . Not on file   Social History Narrative   From [place]   Divorced, remarried then widowed, 3 children (from 1st marriage)   Likes to play golf, travels with motor home.   Prev 4 sport athlete in HS, football fan.     Family History  Problem Relation Age of Onset  . Obesity Mother 63    deceased  . Hypertension Mother   . Diabetes Mother   . Heart disease Mother     before age 110  . Hyperlipidemia Mother   . Alcohol abuse Sister 24    deceased  . Other Father     deceased unknown  . Colon cancer Neg Hx   . Prostate cancer Neg Hx   . Anesthesia problems Neg Hx     Allergies  Allergen Reactions  . Xarelto [Rivaroxaban] Shortness Of Breath and Other (See Comments)    SOB, CHILLS  . Dabigatran Etexilate Mesylate     Blood in urine    Medication list has been reviewed and updated.  Review of Systems:  GEN: No fevers, chills. Nontoxic. Primarily MSK c/o today. MSK: Detailed in the HPI GI: tolerating PO intake without difficulty Neuro: No numbness, parasthesias, or tingling associated. Otherwise the pertinent positives of the ROS are noted above.   Objective:   Physical Examination: BP 137/68  Pulse 60  Temp(Src) 97.7 F (36.5 C) (Oral)  Wt 248 lb (112.492 kg)  SpO2 97%  Ideal Body Weight:      GEN: WDWN, NAD, Non-toxic, Alert & Oriented x 3 HEENT: Atraumatic, Normocephalic.  Ears and Nose: No external deformity. EXTR: No clubbing/cyanosis/edema NEURO: Normal gait.  PSYCH: Normally interactive. Conversant. Not depressed or anxious appearing.  Calm demeanor.    [name] has some mild tenderness his worst MTP. No significant tenderness along his metatarsals. The patient's midfoot is grossly nontender. He is nontender lungs tibia and fibula. Nontender at the ATFL and CFL ligaments. Nontender at the deltoid ligament.  Patient has mild tenderness along his peroneal tendons  as well as posterior tibialis tendons. Nontender along his Achilles tendon. Nontender in the calcaneus.  Nontender with testing and anterior drawer test or with subtalar tilt testing.  He does have some moderate degree of forefoot breakdown with neutral to pes planus of the midfoot. No significant hindfoot breakdown.  Radiology: No results found.  Assessment & Plan:   Right ankle pain  Posterior tibial tendinitis  Peroneal tendinitis  I tried to get an x-ray of the patient's RIGHT ankle, but he LEFT prior to getting this. It took more than an hour for him to be an x-ray, and then he did not want to remain in our office.  The patient became quite angry in the hallway and in the examination room. I did not best to explain the situation, and calm everything down. Our staff was simply overworked with one person working and the blood and radiographic department with multiple people having procedures for both.  Most likely consistent with osteoarthritis of the ankle and 80 year old. Good vascular supply and normal ABIs relatively recently.  Mild peroneal and posterior tibialis tendinopathy. Reviewed some basic rehabilitation balance exercises with the patient.  Signed,  Maud Deed. Zitlaly Malson, MD, [place]  Patient's Medications  New Prescriptions   No medications on file  Previous Medications    APIXABAN (ELIQUIS) 5 MG TABS TABLET    Take 1 tablet (5 mg total) by mouth 2 (two) times daily.   ATORVASTATIN (LIPITOR) 40 MG TABLET    TAKE 1 TABLET BY MOUTH DAILY.   LEVOTHYROXINE (SYNTHROID, LEVOTHROID) 137 MCG TABLET    Take 137 mcg by mouth daily before breakfast.   METOPROLOL SUCCINATE (TOPROL-XL) 100 MG 24 HR TABLET    Take 1 tablet (100 mg total) by mouth daily. Take with or immediately following a meal.   RAMIPRIL (ALTACE) 5 MG CAPSULE    Take 1 capsule (5 mg total) by mouth 2 (two) times daily.  Modified Medications   No medications on file  Discontinued Medications   CIPROFLOXACIN (CIPRO) 500 MG TABLET    Take 500 mg by mouth 2 (two) times daily.

## 2013-07-17 DIAGNOSIS — S8263XA Displaced fracture of lateral malleolus of unspecified fibula, initial encounter for closed fracture: Secondary | ICD-10-CM | POA: Diagnosis not present

## 2013-07-17 DIAGNOSIS — M25559 Pain in unspecified hip: Secondary | ICD-10-CM | POA: Diagnosis not present

## 2013-07-24 DIAGNOSIS — M25579 Pain in unspecified ankle and joints of unspecified foot: Secondary | ICD-10-CM | POA: Diagnosis not present

## 2013-07-26 DIAGNOSIS — M25579 Pain in unspecified ankle and joints of unspecified foot: Secondary | ICD-10-CM | POA: Diagnosis not present

## 2013-07-27 ENCOUNTER — Other Ambulatory Visit: Payer: Self-pay | Admitting: Cardiology

## 2013-07-29 DIAGNOSIS — M25579 Pain in unspecified ankle and joints of unspecified foot: Secondary | ICD-10-CM | POA: Diagnosis not present

## 2013-08-20 DIAGNOSIS — M13879 Other specified arthritis, unspecified ankle and foot: Secondary | ICD-10-CM | POA: Diagnosis not present

## 2013-09-26 DIAGNOSIS — N401 Enlarged prostate with lower urinary tract symptoms: Secondary | ICD-10-CM | POA: Diagnosis not present

## 2013-09-26 DIAGNOSIS — N138 Other obstructive and reflux uropathy: Secondary | ICD-10-CM | POA: Diagnosis not present

## 2013-09-26 DIAGNOSIS — Z8551 Personal history of malignant neoplasm of bladder: Secondary | ICD-10-CM | POA: Diagnosis not present

## 2013-09-27 ENCOUNTER — Encounter: Payer: Self-pay | Admitting: Cardiology

## 2013-09-27 ENCOUNTER — Ambulatory Visit (INDEPENDENT_AMBULATORY_CARE_PROVIDER_SITE_OTHER): Payer: Medicare Other | Admitting: Cardiology

## 2013-09-27 VITALS — BP 140/90 | HR 71 | Ht 72.0 in | Wt 243.0 lb

## 2013-09-27 DIAGNOSIS — I10 Essential (primary) hypertension: Secondary | ICD-10-CM | POA: Diagnosis not present

## 2013-09-27 DIAGNOSIS — I4891 Unspecified atrial fibrillation: Secondary | ICD-10-CM | POA: Diagnosis not present

## 2013-09-27 DIAGNOSIS — I2589 Other forms of chronic ischemic heart disease: Secondary | ICD-10-CM

## 2013-09-27 DIAGNOSIS — I255 Ischemic cardiomyopathy: Secondary | ICD-10-CM

## 2013-09-27 DIAGNOSIS — I2510 Atherosclerotic heart disease of native coronary artery without angina pectoris: Secondary | ICD-10-CM | POA: Diagnosis not present

## 2013-09-27 DIAGNOSIS — I482 Chronic atrial fibrillation, unspecified: Secondary | ICD-10-CM

## 2013-09-27 LAB — CBC WITH DIFFERENTIAL/PLATELET
BASOS PCT: 0.4 % (ref 0.0–3.0)
Basophils Absolute: 0 10*3/uL (ref 0.0–0.1)
Eosinophils Absolute: 0.1 10*3/uL (ref 0.0–0.7)
Eosinophils Relative: 1.8 % (ref 0.0–5.0)
HEMATOCRIT: 44.1 % (ref 39.0–52.0)
Hemoglobin: 14.2 g/dL (ref 13.0–17.0)
Lymphocytes Relative: 20.1 % (ref 12.0–46.0)
Lymphs Abs: 1.5 10*3/uL (ref 0.7–4.0)
MCHC: 32.2 g/dL (ref 30.0–36.0)
MCV: 90.3 fl (ref 78.0–100.0)
MONO ABS: 0.7 10*3/uL (ref 0.1–1.0)
Monocytes Relative: 9.2 % (ref 3.0–12.0)
NEUTROS PCT: 68.5 % (ref 43.0–77.0)
Neutro Abs: 5.1 10*3/uL (ref 1.4–7.7)
Platelets: 249 10*3/uL (ref 150.0–400.0)
RBC: 4.88 Mil/uL (ref 4.22–5.81)
RDW: 15.6 % — ABNORMAL HIGH (ref 11.5–15.5)
WBC: 7.4 10*3/uL (ref 4.0–10.5)

## 2013-09-27 NOTE — Patient Instructions (Addendum)
Your physician recommends that you have  lab work today--CBCd  Your physician wants you to follow-up in: 6 months with Dr Aundra Dubin. (January 2016).  You will receive a reminder letter in the mail two months in advance. If you don't receive a letter, please call our office to schedule the follow-up appointment.   You should have lab, CBCd/BMET in 6 months when you see Dr Aundra Dubin.

## 2013-09-29 NOTE — Progress Notes (Signed)
Patient ID: NAZIRE FRUTH, male   DOB: 05-03-1933, 80 y.o.   MRN: [id number] PCP: Dr. Damita Dunnings  80 yo with history of chronic atrial fibrillation, CAD s/p CABG, AAA repair, and CKD presents for cardiology followup.  Patient had CABG in 1992 and has not had a cardiac cath since that time.  Last echo in 2/15 showed inferior and posterior hypokinesis with EF 40-45%.  Lexiscan Cardiolite in 2/15 showed no ischemia or infarction.  He denies chest pain or exertional dyspnea.  He has had chronic atrial fibrillation for several years.  He does not feel tachypalpitations and rate is controlled.   In 1/15, he developed a UTI/prostatitis apparently after a cystoscopy.  He had some hematuria with the UTI and stopped apixaban briefly.   He is taking apixaban again and has had no more hematuria.  Labs (3/14): K 4.3, creatinine 1.0, HCT 42.9 Labs (10/14): LDL 111, HDL 41 Labs (2/15): LDL 66, HDL 19, AST 46 => normal, ALT 66 => normal Labs (3/15): creatinine 0.9  PMH: 1. Chronic atrial fibrillation: Hematuria on Pradaxa.  2. CAD: CABG x 7 in 1992 (Dr. Redmond Pulling).  Lexiscan Cardiolite (2/15): not gated, no ischemia or infarction.  3. AAA repair: stent graft.  4. CKD 5. OSA: Mild 6. Hypothyroidism 7. HTN 8. Bladder tumor 9. COPD 10. Ischemic cardiomyopathy: EF 40-45% with inferior and posterior hypokinesis, PA systolic pressure 46 mmHg.   SH: Quit smoking in 1992, lives in [place] alone, widower, 3 children.   FH: No premature CAD.   ROS: All systems reviewed and negative except as per HPI.   Current Outpatient Prescriptions  Medication Sig Dispense Refill  . apixaban (ELIQUIS) 5 MG TABS tablet Take 1 tablet (5 mg total) by mouth 2 (two) times daily.  60 tablet  11  . atorvastatin (LIPITOR) 40 MG tablet TAKE 1 TABLET BY MOUTH DAILY.  30 tablet  3  . levothyroxine (SYNTHROID, LEVOTHROID) 137 MCG tablet Take 137 mcg by mouth daily before breakfast.      . metoprolol succinate (TOPROL-XL) 100 MG 24 hr  tablet Take 1 tablet (100 mg total) by mouth daily. Take with or immediately following a meal.  30 tablet  11  . ramipril (ALTACE) 5 MG capsule Take 1 capsule (5 mg total) by mouth 2 (two) times daily.  60 capsule  11   No current facility-administered medications for this visit.   BP 140/90  Pulse 71  Ht 6' (1.829 m)  Wt 243 lb (110.224 kg)  BMI 32.95 kg/m2 General: NAD Neck: JVP 7 cm, no thyromegaly or thyroid nodule.  Lungs: Clear to auscultation bilaterally with normal respiratory effort. CV: Nondisplaced PMI.  Heart irregular S1/S2, no S3/S4, no murmur.  No edema.  No carotid bruit.  Normal pedal pulses.  Abdomen: Soft, nontender, no hepatosplenomegaly, no distention.  Skin: Intact without lesions or rashes.  Neurologic: Alert and oriented x 3.  Psych: Normal affect. Extremities: No clubbing or cyanosis.  Varicose veins lower legs.   Assessment/Plan: 1. CAD: s/p CABG.  No ischemic symptoms.  Lexiscan Cardiolite in 2/15 showed no ischemia or infarction. Continue statin, ACEI.  He is not on aspirin because he has been on apixaban.  2. Hyperlipidemia: Good LDL on atorvastatin.  HDL remains low.  Transaminases elevated initially in 2/15 but repeat check was normal.   3. Chronic atrial fibrillation: Rate controlled with Toprol XL.   No further hematuria on apixaban.   4. AAA stent graft repair: Follows at [facility].  5.  HTN: BP reasonably controlled on current meds.  6. Ischemic cardiomyopathy: EF 40-45% on last echo.  He does not look volume overloaded on exam and has NYHA class I-II symptoms.  Continue Toprol XL and ramipril.    Loralie Champagne 09/29/2013 .

## 2013-10-23 DIAGNOSIS — M19079 Primary osteoarthritis, unspecified ankle and foot: Secondary | ICD-10-CM | POA: Diagnosis not present

## 2013-11-12 ENCOUNTER — Other Ambulatory Visit: Payer: Self-pay | Admitting: Family Medicine

## 2013-11-12 DIAGNOSIS — I10 Essential (primary) hypertension: Secondary | ICD-10-CM

## 2013-11-13 ENCOUNTER — Other Ambulatory Visit (INDEPENDENT_AMBULATORY_CARE_PROVIDER_SITE_OTHER): Payer: Medicare Other

## 2013-11-13 DIAGNOSIS — E039 Hypothyroidism, unspecified: Secondary | ICD-10-CM

## 2013-11-13 DIAGNOSIS — I10 Essential (primary) hypertension: Secondary | ICD-10-CM | POA: Diagnosis not present

## 2013-11-13 DIAGNOSIS — E785 Hyperlipidemia, unspecified: Secondary | ICD-10-CM

## 2013-11-13 DIAGNOSIS — R7989 Other specified abnormal findings of blood chemistry: Secondary | ICD-10-CM | POA: Diagnosis not present

## 2013-11-13 LAB — COMPREHENSIVE METABOLIC PANEL
ALBUMIN: 4 g/dL (ref 3.5–5.2)
ALT: 17 U/L (ref 0–53)
AST: 18 U/L (ref 0–37)
Alkaline Phosphatase: 77 U/L (ref 39–117)
BUN: 14 mg/dL (ref 6–23)
CO2: 29 meq/L (ref 19–32)
Calcium: 9.3 mg/dL (ref 8.4–10.5)
Chloride: 105 mEq/L (ref 96–112)
Creatinine, Ser: 1.1 mg/dL (ref 0.4–1.5)
GFR: 68.14 mL/min (ref >60.00)
GLUCOSE: 112 mg/dL — AB (ref 70–99)
POTASSIUM: 4.4 meq/L (ref 3.5–5.1)
SODIUM: 140 meq/L (ref 135–145)
TOTAL PROTEIN: 8 g/dL (ref 6.0–8.3)
Total Bilirubin: 1.1 mg/dL (ref 0.2–1.2)

## 2013-11-13 LAB — LIPID PANEL
Cholesterol: 148 mg/dL (ref 0–200)
HDL: 34.4 mg/dL — AB (ref >39.00)
LDL Cholesterol: 86 mg/dL (ref 0–99)
NonHDL: 113.6
Total CHOL/HDL Ratio: 4
Triglycerides: 137 mg/dL (ref 0.0–149.0)
VLDL: 27.4 mg/dL (ref 0.0–40.0)

## 2013-11-13 LAB — CBC WITH DIFFERENTIAL/PLATELET
Basophils Absolute: 0 10*3/uL (ref 0.0–0.1)
Basophils Relative: 0.6 % (ref 0.0–3.0)
EOS PCT: 2.1 % (ref 0.0–5.0)
Eosinophils Absolute: 0.1 10*3/uL (ref 0.0–0.7)
HCT: 45.4 % (ref 39.0–52.0)
Hemoglobin: 14.9 g/dL (ref 13.0–17.0)
Lymphocytes Relative: 28.2 % (ref 12.0–46.0)
Lymphs Abs: 2 10*3/uL (ref 0.7–4.0)
MCHC: 32.8 g/dL (ref 30.0–36.0)
MCV: 91.2 fl (ref 78.0–100.0)
MONOS PCT: 8.8 % (ref 3.0–12.0)
Monocytes Absolute: 0.6 10*3/uL (ref 0.1–1.0)
Neutro Abs: 4.4 10*3/uL (ref 1.4–7.7)
Neutrophils Relative %: 60.3 % (ref 43.0–77.0)
PLATELETS: 237 10*3/uL (ref 150.0–400.0)
RBC: 4.98 Mil/uL (ref 4.22–5.81)
RDW: 15.9 % — ABNORMAL HIGH (ref 11.5–15.5)
WBC: 7.2 10*3/uL (ref 4.0–10.5)

## 2013-11-13 LAB — TSH: TSH: 3.26 u[IU]/mL (ref 0.35–4.50)

## 2013-11-22 ENCOUNTER — Encounter: Payer: Self-pay | Admitting: Family Medicine

## 2013-11-22 ENCOUNTER — Ambulatory Visit (INDEPENDENT_AMBULATORY_CARE_PROVIDER_SITE_OTHER): Payer: Medicare Other | Admitting: Family Medicine

## 2013-11-22 VITALS — BP 152/88 | HR 72 | Temp 98.1°F | Ht 72.0 in | Wt 246.2 lb

## 2013-11-22 DIAGNOSIS — Z7189 Other specified counseling: Secondary | ICD-10-CM

## 2013-11-22 DIAGNOSIS — R7989 Other specified abnormal findings of blood chemistry: Secondary | ICD-10-CM

## 2013-11-22 DIAGNOSIS — E785 Hyperlipidemia, unspecified: Secondary | ICD-10-CM

## 2013-11-22 DIAGNOSIS — N529 Male erectile dysfunction, unspecified: Secondary | ICD-10-CM | POA: Diagnosis not present

## 2013-11-22 DIAGNOSIS — I2510 Atherosclerotic heart disease of native coronary artery without angina pectoris: Secondary | ICD-10-CM | POA: Diagnosis not present

## 2013-11-22 DIAGNOSIS — Z0000 Encounter for general adult medical examination without abnormal findings: Secondary | ICD-10-CM

## 2013-11-22 DIAGNOSIS — Z23 Encounter for immunization: Secondary | ICD-10-CM | POA: Diagnosis not present

## 2013-11-22 DIAGNOSIS — I4891 Unspecified atrial fibrillation: Secondary | ICD-10-CM | POA: Diagnosis not present

## 2013-11-22 DIAGNOSIS — E039 Hypothyroidism, unspecified: Secondary | ICD-10-CM

## 2013-11-22 DIAGNOSIS — I10 Essential (primary) hypertension: Secondary | ICD-10-CM | POA: Diagnosis not present

## 2013-11-22 DIAGNOSIS — I480 Paroxysmal atrial fibrillation: Secondary | ICD-10-CM

## 2013-11-22 MED ORDER — LEVOTHYROXINE SODIUM 137 MCG PO TABS: 137.0000 ug | ORAL_TABLET | Freq: Every day | ORAL | Status: DC

## 2013-11-22 MED ORDER — ATORVASTATIN CALCIUM 40 MG PO TABS: 40.0000 mg | ORAL_TABLET | Freq: Every day | ORAL | Status: DC

## 2013-11-22 MED ORDER — METOPROLOL SUCCINATE ER 100 MG PO TB24: 100.0000 mg | ORAL_TABLET | Freq: Every day | ORAL | Status: DC

## 2013-11-22 MED ORDER — RAMIPRIL 5 MG PO CAPS: 5.0000 mg | ORAL_CAPSULE | Freq: Two times a day (BID) | ORAL | Status: DC

## 2013-11-22 MED ORDER — SILDENAFIL CITRATE 100 MG PO TABS: 50.0000 mg | ORAL_TABLET | Freq: Every day | ORAL | Status: DC | PRN

## 2013-11-22 NOTE — Patient Instructions (Signed)
Take care.  Glad to see you.   Recheck in about one year.  Call back as needed.

## 2013-11-22 NOTE — Progress Notes (Signed)
Pre visit review using our clinic review tool, if applicable. No additional management support is needed unless otherwise documented below in the visit note.  I have personally reviewed the Medicare Annual Wellness questionnaire and have noted 1. The patient's medical and social history 2. Their use of alcohol, tobacco or illicit drugs 3. Their current medications and supplements 4. The patient's functional ability including ADL's, fall risks, home safety risks and hearing or visual             impairment. 5. Diet and physical activities 6. Evidence for depression or mood disorders  The patients weight, height, BMI have been recorded in the chart and visual acuity is per eye clinic.  I have made referrals, counseling and provided education to the patient based review of the above and I have provided the pt with a written personalized care plan for preventive services.  Provider list updated- see scanned forms.  Routine anticipatory guidance given to patient.  See health maintenance.  Flu 2015 Shingles 2012 PNA 2012 Tetanus 2008 Colonoscopy 2007 Prostate cancer screening per uro if needed, d/w pt.  He agrees.  Advance directive- daughter and son designated if patient were incapacitated.   Cognitive function addressed- see scanned forms- and if abnormal then additional documentation follows.   ED.  dw pt about viagra, would be reasonable to try again.  No contraindication.  Routine cautions given.  He is back in a relationship now after his wife had died.   Hypertension:    Using medication without problems or lightheadedness: yes Chest pain with exertion:no Edema:no Short of breath:no Average home BPs: lower than in clinic, controlled on home checks.   PAF.  Has been w/o tachycardia per patient.  Feels well. No CP, not SOB.  Compliant with meds.  Labs d/w pt.   Elevated Cholesterol: Using medications without problems:yes Muscle aches: not from meds, only from prev OA Diet  compliance:yes Exercise: as tolerated.   Hypothyroid.  TSH wnl.  Compliant with meds.  No neck mass.    He's going to f/u with ortho re: R ankle pain.   PMH and SH reviewed  Meds, vitals, and allergies reviewed.   ROS: See HPI.  Otherwise negative.    GEN: nad, alert and oriented HEENT: mucous membranes moist NECK: supple w/o LA CV: rrr. PULM: ctab, no inc wob ABD: soft, +bs EXT: no edema SKIN: no acute rash

## 2013-11-24 DIAGNOSIS — Z7189 Other specified counseling: Secondary | ICD-10-CM | POA: Insufficient documentation

## 2013-11-24 NOTE — Assessment & Plan Note (Signed)
Flu 2015 Shingles 2012 PNA 2012 Tetanus 2008 Colonoscopy 2007 Prostate cancer screening per uro if needed, d/w pt.  He agrees.  Advance directive- daughter and son designated if patient were incapacitated.   Cognitive function addressed- see scanned forms- and if abnormal then additional documentation follows.

## 2013-11-24 NOTE — Assessment & Plan Note (Signed)
Sounds to be RRR, continue as is.  Doing well.  Labs d/w pt.

## 2013-11-24 NOTE — Assessment & Plan Note (Signed)
D/w pt about diet and exercise, labs.

## 2013-11-24 NOTE — Assessment & Plan Note (Signed)
Restart viagra with routine cautions.  F/u prn.

## 2013-11-24 NOTE — Assessment & Plan Note (Signed)
tsh wnl, continue as is.   

## 2013-11-24 NOTE — Assessment & Plan Note (Signed)
Controlled out of clinic, no change in meds.

## 2013-11-24 NOTE — Assessment & Plan Note (Signed)
LDL okay, no change in meds.  Continue as is.

## 2014-01-22 DIAGNOSIS — M19071 Primary osteoarthritis, right ankle and foot: Secondary | ICD-10-CM | POA: Diagnosis not present

## 2014-03-06 ENCOUNTER — Telehealth: Payer: Self-pay | Admitting: Cardiology

## 2014-03-06 NOTE — Telephone Encounter (Signed)
Can hold Eliquis 3 days prior to surgery.

## 2014-03-06 NOTE — Telephone Encounter (Signed)
New message     Request for surgical clearance:  1. What type of surgery is being performed? Ankle surgery   2. When is this surgery scheduled? no  3. Are there any medications that need to be held prior to surgery and how long?  eliquis  4. Name of physician performing surgery? Dr Doran Durand  5. What is your office phone and fax number?  Abigail Butts at [phone].  They faxed a request in nov but have not heard from [place].  Will fax another request

## 2014-03-07 ENCOUNTER — Telehealth: Payer: Self-pay | Admitting: Cardiology

## 2014-03-07 NOTE — Telephone Encounter (Signed)
Was transferred to [facility] voice mail.  LM for Abigail Butts-  1-Per Dr Aundra Dubin- pt can hold Eliquis for 3 days prior to surgery.  2-Surgical clearance form completed by Dr Aundra Dubin and returned to HIM to be faxed to [facility].

## 2014-03-07 NOTE — Telephone Encounter (Signed)
Received request from Nurse fax box, documents faxed for surgical clearance. To: [employer] Fax number: [phone] Attention: 1.8.16/km

## 2014-03-27 ENCOUNTER — Ambulatory Visit (INDEPENDENT_AMBULATORY_CARE_PROVIDER_SITE_OTHER): Payer: Medicare Other | Admitting: Cardiology

## 2014-03-27 ENCOUNTER — Encounter: Payer: Self-pay | Admitting: Cardiology

## 2014-03-27 VITALS — BP 132/74 | HR 62 | Ht 72.0 in | Wt 250.0 lb

## 2014-03-27 DIAGNOSIS — I2510 Atherosclerotic heart disease of native coronary artery without angina pectoris: Secondary | ICD-10-CM

## 2014-03-27 DIAGNOSIS — I482 Chronic atrial fibrillation: Secondary | ICD-10-CM

## 2014-03-27 DIAGNOSIS — I10 Essential (primary) hypertension: Secondary | ICD-10-CM

## 2014-03-27 DIAGNOSIS — I4821 Permanent atrial fibrillation: Secondary | ICD-10-CM

## 2014-03-27 NOTE — Patient Instructions (Signed)
Your physician wants you to follow-up in: 6 months with Dr Aundra Dubin. (July 2016).  You will receive a reminder letter in the mail two months in advance. If you don't receive a letter, please call our office to schedule the follow-up appointment.

## 2014-03-27 NOTE — Progress Notes (Signed)
Patient ID: [name], male   DOB: 18-May-1933, 81 y.o.   MRN: [id number] PCP: Dr. Damita Dunnings  81 yo with history of chronic atrial fibrillation, CAD s/p CABG, AAA repair, and CKD presents for cardiology followup.  Patient had CABG in 1992 and has not had a cardiac cath since that time.  Last echo in 2/15 showed inferior and posterior hypokinesis with EF 40-45%.  Lexiscan Cardiolite in 2/15 showed no ischemia or infarction.  He denies chest pain or exertional dyspnea.  He has had chronic atrial fibrillation for several years.  He does not feel tachypalpitations and rate is controlled.   Patient is going to have to get his right ankle replaced, plan for February.  He has been less active due to the ankle pain and weight is up about 7 lbs.    Labs (3/14): K 4.3, creatinine 1.0, HCT 42.9 Labs (10/14): LDL 111, HDL 41 Labs (2/15): LDL 66, HDL 19, AST 46 => normal, ALT 66 => normal Labs (3/15): creatinine 0.9 Labs (9/15): K 4.4, creatinine 1.1, HCT 45.4, TSH normal, LDL 86, HDL 34  PMH: 1. Chronic atrial fibrillation: Hematuria on Pradaxa.  2. CAD: CABG x 7 in 1992 (Dr. Redmond Pulling).  Lexiscan Cardiolite (2/15): not gated, no ischemia or infarction.  3. AAA repair: stent graft.  4. CKD 5. OSA: Mild 6. Hypothyroidism 7. HTN 8. Bladder tumor 9. COPD 10. Ischemic cardiomyopathy: EF 40-45% with inferior and posterior hypokinesis, PA systolic pressure 46 mmHg.   SH: Quit smoking in 1992, lives in [place] alone, widower, 3 children.   FH: No premature CAD.   ROS: All systems reviewed and negative except as per HPI.   Current Outpatient Prescriptions  Medication Sig Dispense Refill  . apixaban (ELIQUIS) 5 MG TABS tablet Take 1 tablet (5 mg total) by mouth 2 (two) times daily. 60 tablet 11  . atorvastatin (LIPITOR) 40 MG tablet Take 1 tablet (40 mg total) by mouth daily at 6 PM. 90 tablet 3  . levothyroxine (SYNTHROID, LEVOTHROID) 137 MCG tablet Take 1 tablet (137 mcg total) by mouth daily before  breakfast. 90 tablet 3  . metoprolol succinate (TOPROL-XL) 100 MG 24 hr tablet Take 1 tablet (100 mg total) by mouth daily. Take with or immediately following a meal. 90 tablet 3  . ramipril (ALTACE) 5 MG capsule Take 1 capsule (5 mg total) by mouth 2 (two) times daily. 180 capsule 3  . sildenafil (VIAGRA) 100 MG tablet Take 0.5-1 tablets (50-100 mg total) by mouth daily as needed for erectile dysfunction. 5 tablet 11   No current facility-administered medications for this visit.   BP 132/74 mmHg  Pulse 62  Ht 6' (1.829 m)  Wt 250 lb (113.399 kg)  BMI 33.90 kg/m2 General: NAD Neck: JVP 7 cm, no thyromegaly or thyroid nodule.  Lungs: Clear to auscultation bilaterally with normal respiratory effort. CV: Nondisplaced PMI.  Heart irregular S1/S2, no S3/S4, no murmur.  No edema.  No carotid bruit.  Normal pedal pulses.  Abdomen: Soft, nontender, no hepatosplenomegaly, no distention.  Skin: Intact without lesions or rashes.  Neurologic: Alert and oriented x 3.  Psych: Normal affect. Extremities: No clubbing or cyanosis.  Varicose veins lower legs.   Assessment/Plan: 1. CAD: s/p CABG.  No ischemic symptoms.  Lexiscan Cardiolite in 2/15 showed no ischemia or infarction. Continue statin, ACEI.  He is not on aspirin because he has been on apixaban.  2. Hyperlipidemia: LDL ok on atorvastatin.  HDL remains low.   3.  Chronic atrial fibrillation: Rate controlled with Toprol XL.  No problems with apixaban.   4. AAA stent graft repair: Follows at [facility].  5. HTN: BP reasonably controlled on current meds.  6. Ischemic cardiomyopathy: EF 40-45% on last echo.  He does not look volume overloaded on exam and has NYHA class I-II symptoms.  Continue Toprol XL and ramipril.   7. Pre-operative evaluation: Patient needs a right ankle replacement.  I think that he can undergo this without undue risk.   He will need to hold Eliquis pre-procedure and continue Toprol XL peri-operatively.   Loralie Champagne 03/27/2014 .

## 2014-04-05 NOTE — Pre-Procedure Instructions (Signed)
[name]  04/05/2014   Your procedure is scheduled on:  February 18  Report to [facility] Admitting at 05:30 AM.  Call this number if you have problems the morning of surgery: [phone]   Remember:   Do not eat food or drink liquids after midnight.   Take these medicines the morning of surgery with A SIP OF WATER: Levothyroxine, Metoprolol   STOP Eliquis February 15   STOP/ Do not take Aspirin, Aleve, Naproxen, Advil, Ibuprofen, Motrin, Vitamins, Herbs, or Supplements starting February 11   Do not wear jewelry, make-up or nail polish.  Do not wear lotions, powders, or perfumes. You may wear deodorant.  Do not shave 48 hours prior to surgery. Men may shave face and neck.  Do not bring valuables to the hospital.  [facility] is not responsible for any belongings or valuables.               Contacts, dentures or bridgework may not be worn into surgery.  Leave suitcase in the car. After surgery it may be brought to your room.  For patients admitted to the hospital, discharge time is determined by your treatment team.               Patients discharged the day of surgery will not be allowed to drive home.  Name and phone number of your driver:   Special Instructions: [name] - Preparing for Surgery  Before surgery, you can play an important role.  Because skin is not sterile, your skin needs to be as free of germs as possible.  You can reduce the number of germs on you skin by washing with CHG (chlorahexidine gluconate) soap before surgery.  CHG is an antiseptic cleaner which kills germs and bonds with the skin to continue killing germs even after washing.  Please DO NOT use if you have an allergy to CHG or antibacterial soaps.  If your skin becomes reddened/irritated stop using the CHG and inform your nurse when you arrive at Short Stay.  Do not shave (including legs and underarms) for at least 48 hours prior to the first CHG shower.  You may shave your  face.  Please follow these instructions carefully:   1.  Shower with CHG Soap the night before surgery and the morning of Surgery.  2.  If you choose to wash your hair, wash your hair first as usual with your normal shampoo.  3.  After you shampoo, rinse your hair and body thoroughly to remove the shampoo.  4.  Use CHG as you would any other liquid soap.  You can apply CHG directly to the skin and wash gently with scrungie or a clean washcloth.  5.  Apply the CHG Soap to your body ONLY FROM THE NECK DOWN.  Do not use on open wounds or open sores.  Avoid contact with your eyes, ears, mouth and genitals (private parts).  Wash genitals (private parts) with your normal soap.  6.  Wash thoroughly, paying special attention to the area where your surgery will be performed.  7.  Thoroughly rinse your body with warm water from the neck down.  8.  DO NOT shower/wash with your normal soap after using and rinsing off the CHG Soap.  9.  Pat yourself dry with a clean towel.            10.  Wear clean pajamas.            11.  Place clean sheets on your bed the night of your first shower and do not sleep with pets.  Day of Surgery  Do not apply any lotions the morning of surgery.  Please wear clean clothes to the hospital/surgery center.     Please read over the following fact sheets that you were given: Pain Booklet, Coughing and Deep Breathing and Surgical Site Infection Prevention

## 2014-04-07 ENCOUNTER — Encounter (HOSPITAL_COMMUNITY)
Admission: RE | Admit: 2014-04-07 | Discharge: 2014-04-07 | Disposition: A | Discharge status: Home / Self Care | Payer: Medicare Other | Source: Ambulatory Visit | Attending: Orthopedic Surgery | Admitting: Orthopedic Surgery

## 2014-04-07 ENCOUNTER — Encounter (HOSPITAL_COMMUNITY): Payer: Self-pay

## 2014-04-07 DIAGNOSIS — Z01812 Encounter for preprocedural laboratory examination: Secondary | ICD-10-CM | POA: Insufficient documentation

## 2014-04-07 DIAGNOSIS — J449 Chronic obstructive pulmonary disease, unspecified: Secondary | ICD-10-CM | POA: Insufficient documentation

## 2014-04-07 DIAGNOSIS — Z951 Presence of aortocoronary bypass graft: Secondary | ICD-10-CM | POA: Insufficient documentation

## 2014-04-07 DIAGNOSIS — I129 Hypertensive chronic kidney disease with stage 1 through stage 4 chronic kidney disease, or unspecified chronic kidney disease: Secondary | ICD-10-CM | POA: Diagnosis not present

## 2014-04-07 DIAGNOSIS — Z87891 Personal history of nicotine dependence: Secondary | ICD-10-CM | POA: Diagnosis not present

## 2014-04-07 DIAGNOSIS — I255 Ischemic cardiomyopathy: Secondary | ICD-10-CM | POA: Diagnosis not present

## 2014-04-07 DIAGNOSIS — G4733 Obstructive sleep apnea (adult) (pediatric): Secondary | ICD-10-CM | POA: Insufficient documentation

## 2014-04-07 DIAGNOSIS — I4891 Unspecified atrial fibrillation: Secondary | ICD-10-CM | POA: Diagnosis not present

## 2014-04-07 DIAGNOSIS — I2510 Atherosclerotic heart disease of native coronary artery without angina pectoris: Secondary | ICD-10-CM | POA: Diagnosis not present

## 2014-04-07 DIAGNOSIS — E039 Hypothyroidism, unspecified: Secondary | ICD-10-CM | POA: Insufficient documentation

## 2014-04-07 DIAGNOSIS — M1990 Unspecified osteoarthritis, unspecified site: Secondary | ICD-10-CM | POA: Diagnosis not present

## 2014-04-07 LAB — CBC
HEMATOCRIT: 47.1 % (ref 39.0–52.0)
Hemoglobin: 15.7 g/dL (ref 13.0–17.0)
MCH: 30.4 pg (ref 26.0–34.0)
MCHC: 33.3 g/dL (ref 30.0–36.0)
MCV: 91.3 fL (ref 78.0–100.0)
PLATELETS: 249 10*3/uL (ref 150–400)
RBC: 5.16 MIL/uL (ref 4.22–5.81)
RDW: 13.6 % (ref 11.5–15.5)
WBC: 6.6 10*3/uL (ref 4.0–10.5)

## 2014-04-07 LAB — BASIC METABOLIC PANEL
Anion gap: 7 (ref 5–15)
BUN: 13 mg/dL (ref 6–23)
CHLORIDE: 108 mmol/L (ref 96–112)
CO2: 25 mmol/L (ref 19–32)
CREATININE: 1.01 mg/dL (ref 0.50–1.35)
Calcium: 9.4 mg/dL (ref 8.4–10.5)
GFR calc non Af Amer: 69 mL/min — ABNORMAL LOW (ref >90)
GFR, EST AFRICAN AMERICAN: 80 mL/min — AB (ref >90)
GLUCOSE: 101 mg/dL — AB (ref 70–99)
Potassium: 4.3 mmol/L (ref 3.5–5.1)
Sodium: 140 mmol/L (ref 135–145)

## 2014-04-07 LAB — SURGICAL PCR SCREEN
MRSA, PCR: NEGATIVE
STAPHYLOCOCCUS AUREUS: POSITIVE — AB

## 2014-04-07 NOTE — Progress Notes (Signed)
Patient denies chest pain, shob, PCP Damita Dunnings, Cardiologist [place], reports all cardiac test have been done through [facility] most recent stress in 2015,

## 2014-04-07 NOTE — Progress Notes (Addendum)
Left message and requested that orders for surgery be placed in Epic. Updated preop instructions with patient to take metoprolol the night before as he normally takes it.

## 2014-04-08 NOTE — Progress Notes (Signed)
Anesthesia Chart Review:  Pt is 81 year old male scheduled for R total ankle arthroplasty on 04/17/2014 with Dr. Doran Durand.   Cardiologist is Dr. Aundra Dubin.   PMH: HTN, atrial fibrillation, CAD (s/p CABG x7 1992), ventricular ectopy, CKD, COPD, ischemic cardiomyopathy, hypothyroidism, OSA. S/p AAA repair. Former smoker. BMI 34  Medications include: Eliquis. Pt to stop taking 04/14/2014.  BP 188/88.    Preoperative labs reviewed.   EKG 03/27/2014: atrial fibrillation. L axis deviation. Incomplete RBBB. Voltage criteria for LVH. Inferior infarct, age undetermined.   Echo 04/04/2013: - Left ventricle: There is akinesis of the basal and mid inferior and inferolateral walls. The cavity size was moderately dilated. Systolic function was mildly to moderately reduced. The estimated ejection fraction was in the range of 40% to 45%. The study was not technically sufficient to allow evaluation of LV diastolic dysfunction due to atrial fibrillation. - Aortic valve: There is focal thickenning of the non-coronary leaflet. Trileaflet; mildly thickened, mildly calcified leaflets. Transvalvular velocity was within the normal range. There was no stenosis. No regurgitation. - Aortic root: The aortic root was normal in size. - Mitral valve: Mild regurgitation. - Left atrium: The atrium was moderately dilated. - Right ventricle: Systolic function was normal. Systolic pressure was increased. - Right atrium: The atrium was moderately dilated. - Tricuspid valve: Moderate regurgitation. - Pulmonic valve: Mild regurgitation. - Pulmonary arteries: Systolic pressure was mildly increased. PA peak pressure: 12mm Hg (S).  Nuclear stress test 04/09/2013: Normal stress nuclear study. The RV appears dilated No gating due to afib. LV Ejection Fraction: Study not gated. LV Wall Motion: NA.  Pt has cardiac clearance from Dr. Aundra Dubin in [place] note dated 03/27/2014. He notes the patient needs to continue toprol XL peri-operatively.    If no acute changes, and if BP stable, I anticipate pt can proceed with surgery as scheduled.   Willeen Cass, FNP-BC [facility]/Anesthesiology Phone: [phone] 04/08/2014 3:44 PM

## 2014-04-17 ENCOUNTER — Inpatient Hospital Stay (HOSPITAL_COMMUNITY): Payer: Medicare Other

## 2014-04-17 ENCOUNTER — Encounter (HOSPITAL_COMMUNITY)
Admission: RE | Disposition: A | Discharge status: Home / Self Care | Payer: Self-pay | Source: Ambulatory Visit | Attending: Orthopedic Surgery

## 2014-04-17 ENCOUNTER — Encounter (HOSPITAL_COMMUNITY): Payer: Self-pay | Admitting: *Deleted

## 2014-04-17 ENCOUNTER — Inpatient Hospital Stay (HOSPITAL_COMMUNITY): Payer: Medicare Other | Admitting: Vascular Surgery

## 2014-04-17 ENCOUNTER — Inpatient Hospital Stay (HOSPITAL_COMMUNITY): Payer: Medicare Other | Admitting: Emergency Medicine

## 2014-04-17 ENCOUNTER — Inpatient Hospital Stay (HOSPITAL_COMMUNITY)
Admission: RE | Admit: 2014-04-17 | Discharge: 2014-04-19 | DRG: 470 | Disposition: A | Discharge status: Home / Self Care | Payer: Medicare Other | Source: Ambulatory Visit | Attending: Orthopedic Surgery | Admitting: Orthopedic Surgery

## 2014-04-17 DIAGNOSIS — Z888 Allergy status to other drugs, medicaments and biological substances status: Secondary | ICD-10-CM | POA: Diagnosis not present

## 2014-04-17 DIAGNOSIS — I482 Chronic atrial fibrillation: Secondary | ICD-10-CM | POA: Diagnosis present

## 2014-04-17 DIAGNOSIS — N189 Chronic kidney disease, unspecified: Secondary | ICD-10-CM | POA: Diagnosis present

## 2014-04-17 DIAGNOSIS — Z87891 Personal history of nicotine dependence: Secondary | ICD-10-CM | POA: Diagnosis not present

## 2014-04-17 DIAGNOSIS — Z951 Presence of aortocoronary bypass graft: Secondary | ICD-10-CM | POA: Diagnosis not present

## 2014-04-17 DIAGNOSIS — M1990 Unspecified osteoarthritis, unspecified site: Secondary | ICD-10-CM | POA: Diagnosis not present

## 2014-04-17 DIAGNOSIS — M19071 Primary osteoarthritis, right ankle and foot: Secondary | ICD-10-CM | POA: Diagnosis not present

## 2014-04-17 DIAGNOSIS — G8918 Other acute postprocedural pain: Secondary | ICD-10-CM | POA: Diagnosis not present

## 2014-04-17 DIAGNOSIS — I2510 Atherosclerotic heart disease of native coronary artery without angina pectoris: Secondary | ICD-10-CM | POA: Diagnosis present

## 2014-04-17 DIAGNOSIS — E785 Hyperlipidemia, unspecified: Secondary | ICD-10-CM | POA: Diagnosis present

## 2014-04-17 DIAGNOSIS — Z79899 Other long term (current) drug therapy: Secondary | ICD-10-CM

## 2014-04-17 DIAGNOSIS — M25571 Pain in right ankle and joints of right foot: Secondary | ICD-10-CM | POA: Diagnosis not present

## 2014-04-17 DIAGNOSIS — I129 Hypertensive chronic kidney disease with stage 1 through stage 4 chronic kidney disease, or unspecified chronic kidney disease: Secondary | ICD-10-CM | POA: Diagnosis present

## 2014-04-17 DIAGNOSIS — E039 Hypothyroidism, unspecified: Secondary | ICD-10-CM | POA: Diagnosis present

## 2014-04-17 DIAGNOSIS — I429 Cardiomyopathy, unspecified: Secondary | ICD-10-CM | POA: Diagnosis present

## 2014-04-17 DIAGNOSIS — G4730 Sleep apnea, unspecified: Secondary | ICD-10-CM | POA: Diagnosis present

## 2014-04-17 DIAGNOSIS — M1250 Traumatic arthropathy, unspecified site: Principal | ICD-10-CM | POA: Diagnosis present

## 2014-04-17 DIAGNOSIS — Z7901 Long term (current) use of anticoagulants: Secondary | ICD-10-CM

## 2014-04-17 DIAGNOSIS — J449 Chronic obstructive pulmonary disease, unspecified: Secondary | ICD-10-CM | POA: Diagnosis present

## 2014-04-17 DIAGNOSIS — Z419 Encounter for procedure for purposes other than remedying health state, unspecified: Secondary | ICD-10-CM

## 2014-04-17 HISTORY — DX: Unspecified osteoarthritis, unspecified site: M19.90

## 2014-04-17 HISTORY — PX: JOINT REPLACEMENT: SHX530

## 2014-04-17 HISTORY — PX: TOTAL ANKLE REPLACEMENT: SUR1218

## 2014-04-17 HISTORY — PX: TOTAL ANKLE ARTHROPLASTY: SHX811

## 2014-04-17 HISTORY — DX: Malignant neoplasm of bladder, unspecified: C67.9

## 2014-04-17 HISTORY — DX: Cardiac arrhythmia, unspecified: I49.9

## 2014-04-17 SURGERY — ARTHROPLASTY, ANKLE, TOTAL
Anesthesia: General | Site: Ankle | Laterality: Right

## 2014-04-17 MED ORDER — RAMIPRIL 5 MG PO CAPS
5.0000 mg | ORAL_CAPSULE | Freq: Two times a day (BID) | ORAL | Status: DC
  Administered 2014-04-17 – 2014-04-19 (×4): 5 mg via ORAL
  Filled 2014-04-17 (×6): qty 1

## 2014-04-17 MED ORDER — PROPOFOL 10 MG/ML IV BOLUS
INTRAVENOUS | Status: DC | PRN
  Administered 2014-04-17: 150 mg via INTRAVENOUS

## 2014-04-17 MED ORDER — SENNA 8.6 MG PO TABS
1.0000 | ORAL_TABLET | Freq: Two times a day (BID) | ORAL | Status: DC
  Administered 2014-04-17 – 2014-04-19 (×4): 8.6 mg via ORAL
  Filled 2014-04-17 (×4): qty 1

## 2014-04-17 MED ORDER — CEFAZOLIN SODIUM-DEXTROSE 2-3 GM-% IV SOLR
2.0000 g | Freq: Four times a day (QID) | INTRAVENOUS | Status: DC
  Administered 2014-04-17: 2 g via INTRAVENOUS
  Filled 2014-04-17 (×3): qty 50

## 2014-04-17 MED ORDER — ONDANSETRON HCL 4 MG/2ML IJ SOLN: 4.0000 mg | Freq: Once | INTRAMUSCULAR | Status: DC | PRN

## 2014-04-17 MED ORDER — PHENYLEPHRINE HCL 10 MG/ML IJ SOLN
INTRAMUSCULAR | Status: DC | PRN
  Administered 2014-04-17 (×2): 80 ug via INTRAVENOUS

## 2014-04-17 MED ORDER — BUPIVACAINE-EPINEPHRINE (PF) 0.5% -1:200000 IJ SOLN
INTRAMUSCULAR | Status: DC | PRN
  Administered 2014-04-17: 20 mL via PERINEURAL

## 2014-04-17 MED ORDER — CHLORHEXIDINE GLUCONATE 4 % EX LIQD
60.0000 mL | Freq: Once | CUTANEOUS | Status: DC
  Filled 2014-04-17: qty 60

## 2014-04-17 MED ORDER — METOPROLOL SUCCINATE ER 100 MG PO TB24
100.0000 mg | ORAL_TABLET | Freq: Every day | ORAL | Status: DC
  Administered 2014-04-17 – 2014-04-18 (×2): 100 mg via ORAL
  Filled 2014-04-17 (×2): qty 1

## 2014-04-17 MED ORDER — ONDANSETRON HCL 4 MG PO TABS: 4.0000 mg | ORAL_TABLET | Freq: Four times a day (QID) | ORAL | Status: DC | PRN

## 2014-04-17 MED ORDER — SODIUM CHLORIDE 0.9 % IV SOLN: INTRAVENOUS | Status: DC

## 2014-04-17 MED ORDER — DOCUSATE SODIUM 100 MG PO CAPS
100.0000 mg | ORAL_CAPSULE | Freq: Two times a day (BID) | ORAL | Status: DC
  Administered 2014-04-17 – 2014-04-19 (×4): 100 mg via ORAL
  Filled 2014-04-17 (×3): qty 1

## 2014-04-17 MED ORDER — BACITRACIN ZINC 500 UNIT/GM EX OINT
TOPICAL_OINTMENT | CUTANEOUS | Status: AC
  Filled 2014-04-17: qty 28.35

## 2014-04-17 MED ORDER — HYDROMORPHONE HCL 1 MG/ML IJ SOLN: 0.2500 mg | INTRAMUSCULAR | Status: DC | PRN

## 2014-04-17 MED ORDER — ONDANSETRON HCL 4 MG/2ML IJ SOLN
INTRAMUSCULAR | Status: DC | PRN
  Administered 2014-04-17: 4 mg via INTRAVENOUS

## 2014-04-17 MED ORDER — BACITRACIN ZINC 500 UNIT/GM EX OINT
TOPICAL_OINTMENT | CUTANEOUS | Status: DC | PRN
Start: 2014-04-17 — End: 2014-04-17
  Administered 2014-04-17: 1 via TOPICAL

## 2014-04-17 MED ORDER — ROPIVACAINE HCL 5 MG/ML IJ SOLN
INTRAMUSCULAR | Status: DC | PRN
  Administered 2014-04-17: 20 mL via PERINEURAL

## 2014-04-17 MED ORDER — 0.9 % SODIUM CHLORIDE (POUR BTL) OPTIME
TOPICAL | Status: DC | PRN
Start: 2014-04-17 — End: 2014-04-17
  Administered 2014-04-17: 1000 mL

## 2014-04-17 MED ORDER — SODIUM CHLORIDE 0.9 % IV SOLN
INTRAVENOUS | Status: DC
  Administered 2014-04-17: 19:00:00 via INTRAVENOUS

## 2014-04-17 MED ORDER — OXYCODONE HCL 5 MG/5ML PO SOLN: 5.0000 mg | Freq: Once | ORAL | Status: DC | PRN

## 2014-04-17 MED ORDER — LABETALOL HCL 5 MG/ML IV SOLN
INTRAVENOUS | Status: DC | PRN
  Administered 2014-04-17: 10 mg via INTRAVENOUS

## 2014-04-17 MED ORDER — FENTANYL CITRATE 0.05 MG/ML IJ SOLN
INTRAMUSCULAR | Status: DC | PRN
  Administered 2014-04-17: 50 ug via INTRAVENOUS
  Administered 2014-04-17 (×2): 25 ug via INTRAVENOUS
  Administered 2014-04-17: 100 ug via INTRAVENOUS
  Administered 2014-04-17: 50 ug via INTRAVENOUS

## 2014-04-17 MED ORDER — ATORVASTATIN CALCIUM 40 MG PO TABS
40.0000 mg | ORAL_TABLET | Freq: Every day | ORAL | Status: DC
  Administered 2014-04-17 – 2014-04-18 (×2): 40 mg via ORAL
  Filled 2014-04-17 (×2): qty 1

## 2014-04-17 MED ORDER — METOPROLOL SUCCINATE ER 100 MG PO TB24: 100.0000 mg | ORAL_TABLET | Freq: Every day | ORAL | Status: DC

## 2014-04-17 MED ORDER — LEVOTHYROXINE SODIUM 25 MCG PO TABS
137.0000 ug | ORAL_TABLET | Freq: Every day | ORAL | Status: DC
  Administered 2014-04-18 – 2014-04-19 (×2): 137 ug via ORAL
  Filled 2014-04-17 (×4): qty 1

## 2014-04-17 MED ORDER — HYDROMORPHONE HCL 1 MG/ML IJ SOLN
0.5000 mg | INTRAMUSCULAR | Status: DC | PRN
  Administered 2014-04-17 – 2014-04-19 (×5): 1 mg via INTRAVENOUS
  Filled 2014-04-17 (×5): qty 1

## 2014-04-17 MED ORDER — CEFAZOLIN SODIUM-DEXTROSE 2-3 GM-% IV SOLR
2.0000 g | INTRAVENOUS | Status: AC
  Administered 2014-04-17: 2 g via INTRAVENOUS

## 2014-04-17 MED ORDER — MIDAZOLAM HCL 2 MG/2ML IJ SOLN
INTRAMUSCULAR | Status: AC
  Filled 2014-04-17: qty 2

## 2014-04-17 MED ORDER — PHENYLEPHRINE 40 MCG/ML (10ML) SYRINGE FOR IV PUSH (FOR BLOOD PRESSURE SUPPORT)
PREFILLED_SYRINGE | INTRAVENOUS | Status: AC
  Filled 2014-04-17: qty 10

## 2014-04-17 MED ORDER — EPHEDRINE SULFATE 50 MG/ML IJ SOLN
INTRAMUSCULAR | Status: DC | PRN
  Administered 2014-04-17: 10 mg via INTRAVENOUS
  Administered 2014-04-17: 20 mg via INTRAVENOUS

## 2014-04-17 MED ORDER — LABETALOL HCL 5 MG/ML IV SOLN
INTRAVENOUS | Status: AC
  Filled 2014-04-17: qty 4

## 2014-04-17 MED ORDER — PROPOFOL 10 MG/ML IV BOLUS
INTRAVENOUS | Status: AC
  Filled 2014-04-17: qty 20

## 2014-04-17 MED ORDER — LACTATED RINGERS IV SOLN
INTRAVENOUS | Status: DC | PRN
  Administered 2014-04-17 (×2): via INTRAVENOUS

## 2014-04-17 MED ORDER — ONDANSETRON HCL 4 MG/2ML IJ SOLN
INTRAMUSCULAR | Status: AC
  Filled 2014-04-17: qty 2

## 2014-04-17 MED ORDER — VANCOMYCIN HCL 500 MG IV SOLR
INTRAVENOUS | Status: AC
  Filled 2014-04-17: qty 500

## 2014-04-17 MED ORDER — APIXABAN 5 MG PO TABS
5.0000 mg | ORAL_TABLET | Freq: Two times a day (BID) | ORAL | Status: DC
  Administered 2014-04-17 – 2014-04-19 (×4): 5 mg via ORAL
  Filled 2014-04-17 (×6): qty 1

## 2014-04-17 MED ORDER — ONDANSETRON HCL 4 MG/2ML IJ SOLN: 4.0000 mg | Freq: Four times a day (QID) | INTRAMUSCULAR | Status: DC | PRN

## 2014-04-17 MED ORDER — CEFAZOLIN SODIUM-DEXTROSE 2-3 GM-% IV SOLR
2.0000 g | Freq: Four times a day (QID) | INTRAVENOUS | Status: AC
  Administered 2014-04-17: 2 g via INTRAVENOUS
  Filled 2014-04-17 (×2): qty 50

## 2014-04-17 MED ORDER — LIDOCAINE HCL (CARDIAC) 20 MG/ML IV SOLN
INTRAVENOUS | Status: DC | PRN
  Administered 2014-04-17: 20 mg via INTRAVENOUS

## 2014-04-17 MED ORDER — EPHEDRINE SULFATE 50 MG/ML IJ SOLN
INTRAMUSCULAR | Status: AC
  Filled 2014-04-17: qty 1

## 2014-04-17 MED ORDER — VANCOMYCIN HCL 500 MG IV SOLR
INTRAVENOUS | Status: DC | PRN
  Administered 2014-04-17: 500 mg

## 2014-04-17 MED ORDER — OXYCODONE HCL 5 MG PO TABS: 5.0000 mg | ORAL_TABLET | Freq: Once | ORAL | Status: DC | PRN

## 2014-04-17 MED ORDER — FENTANYL CITRATE 0.05 MG/ML IJ SOLN
INTRAMUSCULAR | Status: AC
  Filled 2014-04-17: qty 5

## 2014-04-17 MED ORDER — OXYCODONE HCL 5 MG PO TABS
5.0000 mg | ORAL_TABLET | ORAL | Status: DC | PRN
  Administered 2014-04-17 (×3): 10 mg via ORAL
  Filled 2014-04-17 (×3): qty 2

## 2014-04-17 MED ORDER — MIDAZOLAM HCL 5 MG/5ML IJ SOLN
INTRAMUSCULAR | Status: DC | PRN
  Administered 2014-04-17: 2 mg via INTRAVENOUS

## 2014-04-17 SURGICAL SUPPLY — 54 items
ANCH SUT 2 CRKSCW 12X3.5 EYLT (Anchor) ×1 IMPLANT
ANCHOR CORKSCREW 3.5 FIBERWIRE (Anchor) ×2 IMPLANT
BANDAGE ESMARK 6X9 LF (GAUZE/BANDAGES/DRESSINGS) ×1 IMPLANT
BLADE RECIPRO TAPERED (BLADE) ×3 IMPLANT
BLADE SURG 15 STRL LF DISP TIS (BLADE) ×2 IMPLANT
BLADE SURG 15 STRL SS (BLADE) ×6
BNDG CMPR 9X6 STRL LF SNTH (GAUZE/BANDAGES/DRESSINGS) ×1
BNDG ESMARK 6X9 LF (GAUZE/BANDAGES/DRESSINGS) ×3
CHLORAPREP W/TINT 26ML (MISCELLANEOUS) ×5 IMPLANT
COVER SURGICAL LIGHT HANDLE (MISCELLANEOUS) ×3 IMPLANT
CUFF TOURNIQUET SINGLE 34IN LL (TOURNIQUET CUFF) ×3 IMPLANT
DRAPE C-ARM 42X72 X-RAY (DRAPES) ×3 IMPLANT
DRAPE C-ARMOR (DRAPES) ×3 IMPLANT
DRAPE EXTREMITY T 121X128X90 (DRAPE) ×3 IMPLANT
DRAPE IMP U-DRAPE 54X76 (DRAPES) ×3 IMPLANT
DRSG MEPILEX BORDER 4X4 (GAUZE/BANDAGES/DRESSINGS) ×3 IMPLANT
DRSG MEPITEL 4X7.2 (GAUZE/BANDAGES/DRESSINGS) ×3 IMPLANT
DRSG PAD ABDOMINAL 8X10 ST (GAUZE/BANDAGES/DRESSINGS) ×9 IMPLANT
ELECT REM PT RETURN 9FT ADLT (ELECTROSURGICAL) ×3
ELECTRODE REM PT RTRN 9FT ADLT (ELECTROSURGICAL) ×1 IMPLANT
EVACUATOR 1/8 PVC DRAIN (DRAIN) ×3 IMPLANT
GAUZE SPONGE 4X4 12PLY STRL (GAUZE/BANDAGES/DRESSINGS) ×6 IMPLANT
GLOVE BIO SURGEON STRL SZ8 (GLOVE) ×3 IMPLANT
GLOVE BIOGEL PI IND STRL 6.5 (GLOVE) IMPLANT
GLOVE BIOGEL PI IND STRL 8 (GLOVE) ×1 IMPLANT
GLOVE BIOGEL PI INDICATOR 6.5 (GLOVE) ×2
GLOVE BIOGEL PI INDICATOR 8 (GLOVE) ×2
GLOVE SURG SS PI 6.5 STRL IVOR (GLOVE) ×2 IMPLANT
GLOVE SURG SS PI 8.0 STRL IVOR (GLOVE) ×4 IMPLANT
GOWN STRL REUS W/ TWL LRG LVL3 (GOWN DISPOSABLE) ×1 IMPLANT
GOWN STRL REUS W/TWL LRG LVL3 (GOWN DISPOSABLE) ×3
IMPLANT ANKLE BEARING H 11MM (Insert) ×2 IMPLANT
IMPLANT TALAR STAR SZ M RT (Orthopedic Implant) ×2 IMPLANT
IMPLANT TIBIAL STAR SZ XL (Ankle) ×2 IMPLANT
KIT BASIN OR (CUSTOM PROCEDURE TRAY) ×3 IMPLANT
KIT ROOM TURNOVER OR (KITS) ×3 IMPLANT
NS IRRIG 1000ML POUR BTL (IV SOLUTION) ×3 IMPLANT
PACK TOTAL JOINT (CUSTOM PROCEDURE TRAY) ×3 IMPLANT
PACK UNIVERSAL I (CUSTOM PROCEDURE TRAY) ×3 IMPLANT
PAD ARMBOARD 7.5X6 YLW CONV (MISCELLANEOUS) ×4 IMPLANT
PAD CAST 4YDX4 CTTN HI CHSV (CAST SUPPLIES) ×3 IMPLANT
PADDING CAST COTTON 4X4 STRL (CAST SUPPLIES) ×3
PADDING CAST COTTON 6X4 STRL (CAST SUPPLIES) ×3 IMPLANT
SCOTCHCAST PLUS 4X4 WHITE (CAST SUPPLIES) ×2 IMPLANT
SPLINT FIBERGLASS 4X30 (CAST SUPPLIES) ×2 IMPLANT
SPONGE GAUZE 4X4 12PLY STER LF (GAUZE/BANDAGES/DRESSINGS) ×2 IMPLANT
SUCTION FRAZIER TIP 10 FR DISP (SUCTIONS) ×3 IMPLANT
SUT ETHILON 3 0 PS 1 (SUTURE) ×3 IMPLANT
SUT MNCRL AB 3-0 PS2 18 (SUTURE) ×5 IMPLANT
SUT PROLENE 3 0 PS 2 (SUTURE) ×3 IMPLANT
SUT VIC AB 0 CT1 27 (SUTURE) ×3
SUT VIC AB 0 CT1 27XBRD ANBCTR (SUTURE) ×1 IMPLANT
TOWEL OR 17X24 6PK STRL BLUE (TOWEL DISPOSABLE) ×3 IMPLANT
TOWEL OR 17X26 10 PK STRL BLUE (TOWEL DISPOSABLE) ×3 IMPLANT

## 2014-04-17 NOTE — Discharge Instructions (Addendum)
Information on my medicine - ELIQUIS (apixaban)  This medication education was reviewed with me or my healthcare representative as part of my discharge preparation.  The pharmacist that spoke with me during my hospital stay was:  Bajbus, Lauren, RPH  Why was Eliquis prescribed for you? Eliquis was prescribed for you to reduce the risk of a blood clot forming that can cause a stroke if you have a medical condition called atrial fibrillation (a type of irregular heartbeat).  What do You need to know about Eliquis ? Take your Eliquis TWICE DAILY - one tablet in the morning and one tablet in the evening with or without food. If you have difficulty swallowing the tablet whole please discuss with your pharmacist how to take the medication safely.  Take Eliquis exactly as prescribed by your doctor and DO NOT stop taking Eliquis without talking to the doctor who prescribed the medication.  Stopping may increase your risk of developing a stroke.  Refill your prescription before you run out.  After discharge, you should have regular check-up appointments with your healthcare provider that is prescribing your Eliquis.  In the future your dose may need to be changed if your kidney function or weight changes by a significant amount or as you get older.  What do you do if you miss a dose? If you miss a dose, take it as soon as you remember on the same day and resume taking twice daily.  Do not take more than one dose of ELIQUIS at the same time to make up a missed dose.  Important Safety Information A possible side effect of Eliquis is bleeding. You should call your healthcare provider right away if you experience any of the following: ? Bleeding from an injury or your nose that does not stop. ? Unusual colored urine (red or dark brown) or unusual colored stools (red or black). ? Unusual bruising for unknown reasons. ? A serious fall or if you hit your head (even if there is no bleeding).  Some  medicines may interact with Eliquis and might increase your risk of bleeding or clotting while on Eliquis. To help avoid this, consult your healthcare provider or pharmacist prior to using any new prescription or non-prescription medications, including herbals, vitamins, non-steroidal anti-inflammatory drugs (NSAIDs) and supplements.  This website has more information on Eliquis (apixaban): http://www.eliquis.com/eliquis/home   Wylene Simmer, MD [place]  Please read the following information regarding your care after surgery.  Medications  You only need a prescription for the narcotic pain medicine (ex. oxycodone, Percocet, Norco).  All of the other medicines listed below are available over the counter. X acetominophen (Tylenol) 650 mg every 4-6 hours as you need for minor pain X oxycodone as prescribed for moderate to severe pain   Narcotic pain medicine (ex. oxycodone, Percocet, Vicodin) will cause constipation.  To prevent this problem, take the following medicines while you are taking any pain medicine. X docusate sodium (Colace) 100 mg twice a day X senna (Senokot) 2 tablets twice a day  X To help prevent blood clots, restart you Eliquus.  You should also get up every hour while you are awake to move around.    Weight Bearing X Do not bear any weight on the operated leg or foot.  Cast / Splint / Dressing X Keep your splint or cast clean and dry.  Dont put anything (coat hanger, pencil, etc) down inside of it.  If it gets damp, use a hair dryer on the cool  setting to dry it.  If it gets soaked, call the office to schedule an appointment for a cast change.  After your dressing, cast or splint is removed; you may shower, but do not soak or scrub the wound.  Allow the water to run over it, and then gently pat it dry.  Swelling It is normal for you to have swelling where you had surgery.  To reduce swelling and pain, keep your toes above your nose for at least 3 days  after surgery.  It may be necessary to keep your foot or leg elevated for several weeks.  If it hurts, it should be elevated.  Follow Up Call my office at [phone] when you are discharged from the hospital or surgery center to schedule an appointment to be seen two weeks after surgery.  Call my office at [phone] if you develop a fever >101.5 F, nausea, vomiting, bleeding from the surgical site or severe pain.

## 2014-04-17 NOTE — Brief Op Note (Addendum)
04/17/2014  10:18 AM  PATIENT:  [name]  81 y.o. male  PRE-OPERATIVE DIAGNOSIS:  RIGHT ANKLE post traumatic ARTHRITIS  POST-OPERATIVE DIAGNOSIS: 1.  RIGHT ANKLE post traumatic ARTHRITIS      2.  Right ankle lateral ligament instability  Procedure(s): 1.  RIGHT TOTAL ANKLE ARTHOPLASTY  ([employer] STAR ankle:  XL tibia, 11 mm poly, M talus) 2.  Right ankle lateral ligament reconstruction  SURGEON:  Wylene Simmer, MD  ASSISTANT: n/a  ANESTHESIA:   General, regional  EBL:  minimal   TOURNIQUET:  1:53 @300  mm hg   COMPLICATIONS:  None apparent  DISPOSITION:  Extubated, awake and stable to recovery.  DICTATION ID:  [id number]

## 2014-04-17 NOTE — Anesthesia Postprocedure Evaluation (Signed)
  Anesthesia Post-op Note  Patient: [name]  Procedure(s) Performed: Procedure(s): RIGHT TOTAL ANKLE ARTHOPLASTY (Right)  Patient Location: PACU  Anesthesia Type:GA combined with regional for post-op pain  Level of Consciousness: awake, alert , oriented and patient cooperative  Airway and Oxygen Therapy: Patient Spontanous Breathing and Patient connected to nasal cannula oxygen  Post-op Pain: none  Post-op Assessment: Post-op Vital signs reviewed, Patient's Cardiovascular Status Stable, Respiratory Function Stable, Patent Airway, No signs of Nausea or vomiting and Pain level controlled  Post-op Vital Signs: Reviewed and stable  Last Vitals:  Filed Vitals:   04/17/14 1058  BP:   Pulse: 75  Temp: 36.3 C  Resp: 22    Complications: No apparent anesthesia complications

## 2014-04-17 NOTE — Anesthesia Preprocedure Evaluation (Addendum)
Anesthesia Evaluation  Patient identified by MRN, date of birth, ID band Patient awake    Reviewed: Allergy & Precautions, NPO status , Patient's Chart, lab work & pertinent test results  History of Anesthesia Complications Negative for: history of anesthetic complications  Airway Mallampati: I  TM Distance: >3 FB Neck ROM: Full    Dental  (+) Edentulous Upper, Edentulous Lower, Dental Advisory Given   Pulmonary sleep apnea (Took the test but no therapy recommended, no CPAP) , COPDformer smoker (quit 1992),  breath sounds clear to auscultation        Cardiovascular hypertension, Pt. on medications and Pt. on home beta blockers - angina+ CAD, + CABG (1992) and + Peripheral Vascular Disease (s/p AAA repair) + dysrhythmias Atrial Fibrillation Rhythm:Regular Rate:Normal  '15 myoview: no ischemia '15 ECHO: Inferior and inferolateral AK, EF 40-45%, mild MR, mod TR   Neuro/Psych negative neurological ROS     GI/Hepatic Neg liver ROS, hiatal hernia,   Endo/Other  negative endocrine ROS  Renal/GU negative Renal ROS     Musculoskeletal   Abdominal (+) + obese,   Peds  Hematology   Anesthesia Other Findings ?Hx of A Fib  Reproductive/Obstetrics                          Anesthesia Physical Anesthesia Plan  ASA: III  Anesthesia Plan: General   Post-op Pain Management: MAC Combined w/ Regional for Post-op pain   Induction: Intravenous  Airway Management Planned: LMA  Additional Equipment:   Intra-op Plan:   Post-operative Plan: Extubation in OR  Informed Consent: I have reviewed the patients History and Physical, chart, labs and discussed the procedure including the risks, benefits and alternatives for the proposed anesthesia with the patient or authorized representative who has indicated his/her understanding and acceptance.   Dental advisory given  Plan Discussed with: CRNA, Anesthesiologist  and Surgeon  Anesthesia Plan Comments:         Anesthesia Quick Evaluation

## 2014-04-17 NOTE — Transfer of Care (Signed)
Immediate Anesthesia Transfer of Care Note  Patient: [name]  Procedure(s) Performed: Procedure(s): RIGHT TOTAL ANKLE ARTHOPLASTY (Right)  Patient Location: PACU  Anesthesia Type:General  Level of Consciousness: awake and alert   Airway & Oxygen Therapy: Patient Spontanous Breathing and Patient connected to face mask oxygen  Post-op Assessment: Report given to RN and Post -op Vital signs reviewed and stable  Post vital signs: Reviewed and stable  Last Vitals:  Filed Vitals:   04/17/14 0612  BP: 174/97  Pulse: 61  Temp: 36.8 C  Resp: 20    Complications: No apparent anesthesia complications

## 2014-04-17 NOTE — H&P (Signed)
[name] is an 81 y.o. male.   Chief Complaint: right ankle pain HPI: 81 y/o male with right ankle arthritis presents today for right total ankle replacement.  He has failed non operative treatment to date including bracing, activity modification, oral anti inflammatories and physical therapy.  Past Medical History  Diagnosis Date  . Hyperglycemia diet controlled  . Obesity   . Hyperlipidemia   . Organic impotence   . Hypertension   . Hiatal hernia     repaired in 1992  . Bladder tumor     s/p excision  . Hematuria bladder tumor ---  followed by Dr Karsten Ro  . S/P CABG x 7 1992  . S/P AAA repair using bifurcation graft   . Sleep apnea no rx cpap    mild- tested  2012  . Coronary artery disease s/p cabg x7  1992    a. s/p CABG in 1992 (X 7 - Dr. Redmond Pulling);  b.  Myoview (01/2011): Fixed inferior defect suggestive of prior MI versus diaphragmatic attenuation, apical reversibility possibly suspicious for small area of infarction with peri-infarct ischemia, no significant change from prior study, not gated, low risk study;  c.  Echo (05/2010): Mod LVH, EF 55-65%, mild AI, mild MR, moderate LAE, mild RAE, moderate   . Ventricular ectopy     mild  . Atrial fibrillation     a. permanent;  b. Hematuria on Pradaxa;  c. cold intol and chills with Xarelto  . AAA (abdominal aortic aneurysm) 04/29/11    s/p stent graft repair 2012  . Chronic kidney disease   . Hypothyroidism   . COPD (chronic obstructive pulmonary disease)   . Cardiomyopathy     a. Echo (03/2013): Inferior and inferolateral AK, EF 40-45%, mild MR, mod LAE, mod RAE, mod TR, mild PI, PASP 46  . Hx of cardiovascular stress test     Lexiscan Myoview (03/2013):  No ischemia; not gated.  Marland Kitchen Dysrhythmia     Past Surgical History  Procedure Laterality Date  . Coronary artery bypass graft  05-1990    x7   . Cardiac catheterization  03-1990    Positive  . Ett  10/31/00    WNL  . [place]  03/28/02  . Endovascular stent  insertion  02/15/2011    Procedure: ENDOVASCULAR STENT GRAFT INSERTION;  Surgeon: Hinda Lenis, MD;  Location: [facility];  Service: Vascular;  Laterality: N/A;  Insertion of endovascular stent graft   . Cardiovascular stress test  02-01-2011  NUCLEAR NO EXERCISE    LOW RISK STUDY.  SMALL APICAL DEFECT. NO EVIDENCE OF MULTIZONE ISCHEMIA.  . Inguinal hernia repair  1997    left  . Transurethral resection of bladder tumor  03/25/2011    Procedure: TRANSURETHRAL RESECTION OF BLADDER TUMOR (TURBT);  Surgeon: Claybon Jabs, MD;  [facility];  Service: Urology;  Laterality: N/A;    Family History  Problem Relation Age of Onset  . Obesity Mother 43    deceased  . Hypertension Mother   . Diabetes Mother   . Heart disease Mother     before age 47  . Hyperlipidemia Mother   . Alcohol abuse Sister 5    deceased  . Other Father     deceased unknown  . Colon cancer Neg Hx   . Prostate cancer Neg Hx   . Anesthesia problems Neg Hx    Social History:  reports that he quit smoking about 24 years ago.  His smoking use included Cigarettes. He has a 37.5 pack-year smoking history. He has never used smokeless tobacco. He reports that he drinks alcohol. He reports that he does not use illicit drugs.  Allergies:  Allergies  Allergen Reactions  . Xarelto [Rivaroxaban] Shortness Of Breath and Other (See Comments)    SOB, CHILLS  . Dabigatran Etexilate Mesylate     Blood in urine    Medications Prior to Admission  Medication Sig Dispense Refill  . apixaban (ELIQUIS) 5 MG TABS tablet Take 1 tablet (5 mg total) by mouth 2 (two) times daily. 60 tablet 11  . atorvastatin (LIPITOR) 40 MG tablet Take 1 tablet (40 mg total) by mouth daily at 6 PM. 90 tablet 3  . levothyroxine (SYNTHROID, LEVOTHROID) 137 MCG tablet Take 1 tablet (137 mcg total) by mouth daily before breakfast. 90 tablet 3  . metoprolol succinate (TOPROL-XL) 100 MG 24 hr tablet Take 1 tablet (100 mg total) by mouth  daily. Take with or immediately following a meal. (Patient taking differently: Take 100 mg by mouth daily with supper. Take with or immediately following a meal.) 90 tablet 3  . ramipril (ALTACE) 5 MG capsule Take 1 capsule (5 mg total) by mouth 2 (two) times daily. 180 capsule 3  . sildenafil (VIAGRA) 100 MG tablet Take 0.5-1 tablets (50-100 mg total) by mouth daily as needed for erectile dysfunction. 5 tablet 11    No results found for this or any previous visit (from the past 48 hour(s)). No results found.  ROS  No recent f/c/n/v/wt loss.  Blood pressure 174/97, pulse 61, temperature 98.2 F (36.8 C), temperature source Oral, resp. rate 20, height 6' (1.829 m), weight 112.946 kg (249 lb), SpO2 97 %. Physical Exam  wn wd male in nad.  A and O x 4.  Mood and affect normal.  EOMI.  resp unlabored.  R ankle with swellinga nd effusion.  SKin healthy and intact.  No lymphadenopathy.  5/5 strength in PF and DF.  ROM limited.    Assessment/Plan R ankle arthritis - to OR for right total ankle replacement.  The risks and benefits of the alternative treatment options have been discussed in detail.  The patient wishes to proceed with surgery and specifically understands risks of bleeding, infection, nerve damage, blood clots, need for additional surgery, amputation and death.   Wylene Simmer May 09, 2014, 7:13 AM

## 2014-04-17 NOTE — Anesthesia Procedure Notes (Addendum)
Anesthesia Regional Block:  Popliteal block  Pre-Anesthetic Checklist: ,, timeout performed, Correct Patient, Correct Site, Correct Laterality, Correct Procedure, Correct Position, site marked, Risks and benefits discussed,  Surgical consent,  Pre-op evaluation,  At surgeon's request and post-op pain management  Laterality: Right and Lower  Prep: chloraprep       Needles:  Injection technique: Single-shot  Needle Type: Echogenic Needle     Needle Length: 9cm 9 cm Needle Gauge: 21 and 21 G    Additional Needles:  Procedures: ultrasound guided (picture in chart) Popliteal block Narrative:  Start time: 04/17/2014 7:00 AM End time: 04/17/2014 7:04 AM Injection made incrementally with aspirations every 5 mL.  Performed by: Personally  Anesthesiologist: Marajade Lei A   Anesthesia Regional Block:  Adductor canal block  Pre-Anesthetic Checklist: ,, timeout performed, Correct Patient, Correct Site, Correct Laterality, Correct Procedure, Correct Position, site marked, Risks and benefits discussed,  Surgical consent,  Pre-op evaluation,  At surgeon's request and post-op pain management  Laterality: Right and Lower  Prep: chloraprep       Needles:  Injection technique: Single-shot  Needle Type: Echogenic Needle     Needle Length: 9cm 9 cm Needle Gauge: 21 and 21 G    Additional Needles:  Procedures: ultrasound guided (picture in chart) Adductor canal block Narrative:  Start time: 04/17/2014 7:05 AM End time: 04/17/2014 7:08 AM Injection made incrementally with aspirations every 5 mL.  Performed by: Personally  Anesthesiologist: [place], [place]

## 2014-04-17 NOTE — Evaluation (Signed)
Physical Therapy Evaluation Patient Details Name: [name] MRN: [id number] DOB: [birth year] Today's Date: 04/17/2014   History of Present Illness  pt is a 81 y.o. male s/p Rt ankle arthroplasty.   Clinical Impression  Patient is s/p above surgery resulting in functional limitations due to the deficits listed below (see PT Problem List). Patient will benefit from skilled PT to increase their independence and safety with mobility to allow discharge to the venue listed below.  Hopeful to D/C tomorrow with son. Patient needs to practice stairs next session.        Follow Up Recommendations No PT follow up;Supervision/Assistance - 24 hour    Equipment Recommendations  None recommended by PT    Recommendations for Other Services       Precautions / Restrictions Precautions Precautions: Fall Restrictions Weight Bearing Restrictions: Yes RLE Weight Bearing: Non weight bearing      Mobility  Bed Mobility Overal bed mobility: Modified Independent             General bed mobility comments: HOB elevated  Transfers Overall transfer level: Needs assistance Equipment used: Rolling walker (2 wheeled) Transfers: Sit to/from [facility] Sit to Stand: Min guard Stand pivot transfers: Min guard       General transfer comment: pt demo good ability to maintain NWB status on Rt LE; min guard to steady with RW; cues for RW sequencing   Ambulation/Gait             General Gait Details: pivotal steps only  Science writer    Modified Rankin (Stroke Patients Only)       Balance Overall balance assessment: Needs assistance Sitting-balance support: Feet supported;No upper extremity supported Sitting balance-Leahy Scale: Good     Standing balance support: During functional activity;Bilateral upper extremity supported Standing balance-Leahy Scale: Poor Standing balance comment: RW to balance and maintain NWB status                               Pertinent Vitals/Pain Pain Assessment: No/denies pain    Home Living Family/patient expects to be discharged to:: Private residence Living Arrangements: Alone Available Help at Discharge: Family;Available 24 hours/day Type of Home: House Home Access: Stairs to enter Entrance Stairs-Rails: Left Entrance Stairs-Number of Steps: 3 Home Layout: One level Home Equipment: Walker - 2 wheels;Other (comment);Shower seat;Bedside commode Additional Comments: pt plans to D/c to sons house     Prior Function Level of Independence: Independent               Hand Dominance        Extremity/Trunk Assessment   Upper Extremity Assessment: Overall WFL for tasks assessed           Lower Extremity Assessment: RLE deficits/detail RLE Deficits / Details: quad and hip WFL; pt with decr sensation in toes due to block    Cervical / Trunk Assessment: Normal  Communication   Communication: No difficulties  Cognition Arousal/Alertness: Awake/alert Behavior During Therapy: WFL for tasks assessed/performed Overall Cognitive Status: Within Functional Limits for tasks assessed                      General Comments      Exercises General Exercises - Lower Extremity Hip ABduction/ADduction: AAROM;Right;10 reps Straight Leg Raises: AROM;Right;10 reps      Assessment/Plan    PT Assessment Patient  needs continued PT services  PT Diagnosis Difficulty walking;Acute pain   PT Problem List Decreased strength;Decreased activity tolerance;Decreased balance;Decreased mobility;Decreased knowledge of use of DME;Decreased safety awareness;Decreased knowledge of precautions;Pain;Impaired sensation  PT Treatment Interventions DME instruction;Gait training;Stair training;Functional mobility training;Therapeutic activities;Balance training;Therapeutic exercise;Patient/family education   PT Goals (Current goals can be found in the Care Plan section) Acute  Rehab PT Goals Patient Stated Goal: to go home tomorrow PT Goal Formulation: With patient Time For Goal Achievement: 04/19/14 Potential to Achieve Goals: Good    Frequency Min 4X/week   Barriers to discharge        Co-evaluation               End of Session Equipment Utilized During Treatment: Gait belt Activity Tolerance: Patient tolerated treatment well Patient left: in chair;with call bell/phone within reach Nurse Communication: Mobility status;Weight bearing status         Time: 3220-2542 PT Time Calculation (min) (ACUTE ONLY): 14 min   Charges:   PT Evaluation $Initial PT Evaluation Tier I: 1 Procedure     PT G CodesGustavus Bryant, [place]  [phone] 04/17/2014, 3:58 PM

## 2014-04-18 ENCOUNTER — Encounter (HOSPITAL_COMMUNITY): Payer: Self-pay | Admitting: Orthopedic Surgery

## 2014-04-18 MED ORDER — HYDROMORPHONE HCL 4 MG PO TABS
4.0000 mg | ORAL_TABLET | ORAL | Status: DC | PRN
Start: 2014-04-18 — End: 2014-11-26

## 2014-04-18 MED ORDER — DOCUSATE SODIUM 100 MG PO CAPS: 100.0000 mg | ORAL_CAPSULE | Freq: Two times a day (BID) | ORAL | Status: DC

## 2014-04-18 MED ORDER — ACETAMINOPHEN 325 MG PO TABS: 650.0000 mg | ORAL_TABLET | Freq: Four times a day (QID) | ORAL | Status: DC | PRN

## 2014-04-18 MED ORDER — SENNA 8.6 MG PO TABS: 1.0000 | ORAL_TABLET | Freq: Two times a day (BID) | ORAL | Status: DC

## 2014-04-18 MED ORDER — HYDROMORPHONE HCL 2 MG PO TABS
4.0000 mg | ORAL_TABLET | ORAL | Status: DC | PRN
  Administered 2014-04-18 – 2014-04-19 (×7): 4 mg via ORAL
  Filled 2014-04-18 (×7): qty 2

## 2014-04-18 NOTE — Progress Notes (Signed)
Physical Therapy Treatment Patient Details Name: [name] MRN: [id number] DOB: 03/23/1933 Today's Date: 04/18/2014    History of Present Illness pt is a 81 y.o. male s/p Rt ankle arthroplasty.     PT Comments    Pt able to mobilize with knee walker. Min cues and supervision for safety. Pt did not wish to practice steps; stated "we have it figured out". Pt safe from mobility standpoint to D/C home with family.   Follow Up Recommendations  No PT follow up;Supervision/Assistance - 24 hour     Equipment Recommendations  None recommended by PT    Recommendations for Other Services       Precautions / Restrictions Precautions Precautions: None Restrictions Weight Bearing Restrictions: Yes RLE Weight Bearing: Non weight bearing    Mobility  Bed Mobility Overal bed mobility: Modified Independent             General bed mobility comments: HOB flattened   Transfers Overall transfer level: Needs assistance Equipment used: Ambulation equipment used Transfers: Sit to/from Stand;Stand Pivot Transfers Sit to Stand: Supervision Stand pivot transfers: Supervision       General transfer comment: min cues for technique with knee walker   Ambulation/Gait Ambulation/Gait assistance: Supervision Ambulation Distance (Feet): 90 Feet Assistive device:  (knee walker ) Gait Pattern/deviations: Step-to pattern Gait velocity: decr Gait velocity interpretation: Below normal speed for age/gender General Gait Details: pt ambulating with knee walker this session; min cues for technique; no LOB noted; pt did fatigue quickly    Stairs Stairs:  (pt stated "i dont need to practice, we have it figured outTeacher, adult education Rankin (Stroke Patients Only)       Balance Overall balance assessment: No apparent balance deficits (not formally assessed)                                  Cognition Arousal/Alertness: Awake/alert Behavior  During Therapy: WFL for tasks assessed/performed Overall Cognitive Status: Within Functional Limits for tasks assessed                      Exercises      General Comments General comments (skin integrity, edema, etc.): reviewed general LE HEP       Pertinent Vitals/Pain Pain Assessment: 0-10 Pain Score: 5  Pain Location: Rt ankle Pain Descriptors / Indicators: Sore Pain Intervention(s): Premedicated before session;Repositioned;Monitored during session    Home Living                      Prior Function            PT Goals (current goals can now be found in the care plan section) Acute Rehab PT Goals Patient Stated Goal: to go home PT Goal Formulation: With patient Time For Goal Achievement: 04/19/14 Potential to Achieve Goals: Good Progress towards PT goals: Goals met/education completed, patient discharged from PT    Frequency  Min 4X/week    PT Plan Current plan remains appropriate    Co-evaluation             End of Session Equipment Utilized During Treatment: Gait belt Activity Tolerance: Patient tolerated treatment well Patient left: in chair;with call bell/phone within reach     Time: 0813-0829 PT Time Calculation (min) (ACUTE ONLY): 16 min  Charges:  $Gait Training: 8-22 mins  G CodesGustavus Bryant, [name] 04/18/2014, 10:27 AM

## 2014-04-18 NOTE — Op Note (Signed)
[name], [name] NO.:  [id number]  MEDICAL RECORD NO.:  [id number]  LOCATION:  [id number]                        FACILITY:  [facility]  PHYSICIAN:  Wylene Simmer, MD        DATE OF BIRTH:  11/09/1933  DATE OF PROCEDURE:  04/17/2014 DATE OF DISCHARGE:                              OPERATIVE REPORT   PREOPERATIVE DIAGNOSIS:  Right ankle arthritis.  POSTOPERATIVE DIAGNOSIS:  Right ankle arthritis.  PROCEDURE: 1. Right total ankle arthroplasty. 2. Lateral ligament reconstruction of the ankle.  SURGEON:  Wylene Simmer, MD  ANESTHESIA:  General, regional.  ESTIMATED BLOOD LOSS:  Minimal.  TOURNIQUET TIME:  1 hour 53 minutes at 300 mmHg.  COMPLICATIONS:  None apparent.  DISPOSITION:  Extubated, awake, and stable to recovery.  INDICATIONS FOR PROCEDURE:  The patient is a 81 year old male with past medical history significant for atrial fibrillation and coronary artery disease.  He has a history of posttraumatic arthritis of the right ankle.  He has failed nonoperative treatment to date and presents today for surgical correction of this painful condition.  He understands the risks and benefits, the alternative treatment options, and elects surgical treatment.  He specifically understands risks of bleeding, infection, nerve damage, blood clots, need for additional surgery, continued pain, amputation, and death.  PROCEDURE IN DETAIL:  After preoperative consent was obtained and the correct operative site was identified, the patient was brought to the operating room and placed supine on the operating table.  General anesthesia was induced.  Preoperative antibiotics were administered. Surgical time-out was taken.  The right lower extremity was prepped and draped in standard sterile fashion with tourniquet around the thigh. The extremity was exsanguinated and tourniquet was inflated to 300 mmHg. A longitudinal incision was made over the extensor hallucis longus tendon.   Sharp dissection was carried down through skin and subcutaneous tissue.  The extensor retinaculum was incised over the EHL tendon and released proximally and distally.  Subperiosteal dissection was carried medially and laterally  releasing the anterior joint capsule from the distal tibia.  The neurovascular bundle was retracted and protected throughout the case.  Anterior osteophytes were then removed with a rongeur.  A quarter-inch straight osteotome was inserted into the medial gutter to help set the rotational alignment.  A 3.2 mm guide pin was inserted through a stab incision at the tibial tubercle in line with this medial gutter osteotome.  The external alignment guide was then applied and set at the level of the tibial cut.  Coronal and sagittal plane alignment was set using fluoroscopy.  Rotational alignment was set using the osteotome and the medial gutter.  The guide was pinned into position.  The distal cutting guide was then inserted along with an Angel wing.  Lateral view showed appropriate resection level.  The distal cutting block was then pinned into position and the distal cut was made with the oscillating saw.  The cut bone was all removed.  The talar cutting guide was then applied and pinned into position.  The talar cut was made.  The cut surface of the bone was then measured and a size medium datum was selected and inserted.  The anterior-posterior  cuts were then made followed by the medial lateral cuts.  All waist bone was removed.  The window trial was placed and showed appropriate cuts. The keel was drilled and punched.  The wound was then irrigated copiously.  Vancomycin powder was applied to the cut surfaces of the bone and the size medium implant was impacted into position on the talus.  The distal tibia was measured and a size extra large tibial implant was selected.  The implant was pinned into position and the barrel holes were drilled and punched.  The wound  was again irrigated copiously.  The extra-large implant was inserted and impacted into position.  AP and lateral radiographs confirmed appropriate position of this implant.  A size 11 trial poly was inserted.  The patient was noted to have appropriate ankle stability except the ATFL was incompetent. The decision was made to proceed with lateral ligament reconstruction. The trial poly was removed.  The wound was again irrigated copiously. More vancomycin powder was sprinkled in the wound and the 11 mm polyethylene spacer was implanted.  The anterior aspect of the fibula was then decorticated with a rongeur and a 3.5-mm fully-threaded Arthrex corkscrew anchor was inserted.  The implant was noted to have excellent purchase.  The anterior talofibular ligament was then repaired back to its origin on the distal fibula with horizontal mattress sutures.  The ligament balance of the ankle was now noted to be appropriately restored.  Wound was again irrigated and the retinaculum was closed over the tibialis anterior and extensor hallucis longus tendons with simple and running sutures of 0 Vicryl.  The barrel holes had been bone grafted with autograft bone prior to closure.  The subcutaneous tissue was then approximated with inverted simple sutures of 3-0 Monocryl and a running 3-0 Prolene was used to close the skin incision.  The proximal 3.2 mm guide pin was removed and this incision closed with Prolene as well. Sterile dressings were applied followed by a well-padded short-leg cast. Tourniquet was released after application of the dressings at 1 hour 53 minutes.  The patient was then awakened from anesthesia and transported to the recovery room in stable condition.  FOLLOWUP PLAN:  The patient will be admitted for physical therapy to remain non-weight bearing.  He will start on Lovenox for DVT prophylaxis while an inpatient and be discharged on aspirin for DVT prophylaxis.  He will resume his  blood thinners.  He will follow up with me in 3 weeks in the office.     Wylene Simmer, MD     JH/MEDQ  D:  04/17/2014  T:  04/18/2014  Job:  [id number]

## 2014-04-18 NOTE — Evaluation (Signed)
Occupational Therapy Evaluation Patient Details Name: [name] MRN: [id number] DOB: [birth year] Today's Date: 04/18/2014    History of Present Illness pt is a 81 y.o. male s/p Rt ankle arthroplasty.    Clinical Impression   This 81 yo male admitted and underwent above presents to acute OT with all suggestions made and education completed, pt without further questions or concerns about BADLs, we will sign off.    Follow Up Recommendations  No OT follow up    Equipment Recommendations  None recommended by OT       Precautions / Restrictions Precautions Precautions: None Restrictions Weight Bearing Restrictions: Yes RLE Weight Bearing: Non weight bearing              ADL                                         General ADL Comments: Spoke with pt about his concern with being able to get a shower at his son's house since his son has a tub shower combination (at pt's house he has a walk in shower with built in seat). He talked about just getting a chair to sit in the tub, I told him this was feasible but that I would recommend that someone be with him getting in and out so as to not tip the chair as he would go from sit>stand and stand >sit (since he would be backing up to the seat, sitting down across the tub wall and thus would have to make sure he sat really far back on the seat so as not to tip it) and then swing his legs around. I also said that some patients have told me that they sit down on the side of the tub and then swing around, pulling shower curtain behind them and then sitting on the part of the shower curtain that is going around their bottom. He is also aware that he needs to keep his leg dry--he mentioned bagging it and taping it which I said wa a viable plan he just needs to double bag and tape it or he could get a short leg cast cover that they sell at [facility].. I offered to take him to the gym and have him practive but he politely declined  stating, " we will figure it out" when I get home.  I made him aware that when he gets dressed he needs to put RLE in first as far as underwear and pants to make it easier.                     Hand Dominance Right   Extremity/Trunk Assessment Upper Extremity Assessment Upper Extremity Assessment: Overall WFL for tasks assessed           Communication Communication Communication: No difficulties   Cognition Arousal/Alertness: Awake/alert Behavior During Therapy: WFL for tasks assessed/performed Overall Cognitive Status: Within Functional Limits for tasks assessed                                Home Living Family/patient expects to be discharged to:: Private residence Living Arrangements: Alone Available Help at Discharge: Family;Available 24 hours/day (going to son's house per pt) Type of Home: House Home Access: Stairs to enter [employer] of Steps: 3 Entrance Stairs-Rails: Left Home Layout: One level  Bathroom Shower/Tub: Tub/shower unit;Curtain Shower/tub characteristics: Architectural technologist: Standard     Home Equipment: Environmental consultant - 2 wheels;Other (comment);Bedside commode;Shower seat - built in   Additional Comments: pt plans to D/c to sons house       Prior Functioning/Environment Level of Independence: Independent             OT Diagnosis: Generalized weakness         OT Goals(Current goals can be found in the care plan section) Acute Rehab OT Goals Patient Stated Goal: home today, but I think my family said I had to stay another night  OT Frequency:                End of Session    Activity Tolerance: Patient tolerated treatment well Patient left: in bed;with call bell/phone within reach   Time: 1135-1145 OT Time Calculation (min): 10 min Charges:  OT General Charges $OT Visit: 1 Procedure OT Evaluation $Initial OT Evaluation Tier I: 1 Procedure  Almon Register [phone] 04/18/2014, 4:28  PM

## 2014-04-18 NOTE — Progress Notes (Signed)
Subjective: 1 Day Post-Op Procedure(s) (LRB): RIGHT TOTAL ANKLE ARTHOPLASTY (Right) Patient reports pain as mild.  Controlled with IV dilaudid.  Oxy not helping much.  Cant' take nsaids due to [place].  Objective: Vital signs in last 24 hours: Temp:  [97.4 F (36.3 C)-98.8 F (37.1 C)] 98.3 F (36.8 C) (02/19 0604) Pulse Rate:  [66-80] 69 (02/19 0604) Resp:  [10-22] 18 (02/19 0604) BP: (98-156)/(58-104) 98/60 mmHg (02/19 0604) SpO2:  [95 %-99 %] 96 % (02/19 0604)  Intake/Output from previous day: 02/18 0701 - 02/19 0700 In: 1750 [P.O.:280; I.V.:1420; IV Piggyback:50] Out: 9728 [Urine:1150; Blood:15] Intake/Output this shift:    No results for input(s): HGB in the last 72 hours. No results for input(s): WBC, RBC, HCT, PLT in the last 72 hours. No results for input(s): NA, K, CL, CO2, BUN, CREATININE, GLUCOSE, CALCIUM in the last 72 hours. No results for input(s): LABPT, INR in the last 72 hours.  PE:  wn wd male in nad.  R LE casted.  NVi at toes.  Assessment/Plan: 1 Day Post-Op Procedure(s) (LRB): RIGHT TOTAL ANKLE ARTHOPLASTY (Right) Up with therapy  NWB on R LE.  Home tomorrow morning.  [name] 04/18/2014, 7:18 AM

## 2014-04-19 NOTE — Progress Notes (Signed)
Discharge instructions given,Patient verbalized understanding.Discharged home per instructions.

## 2014-04-19 NOTE — Progress Notes (Signed)
Patient ID: [name], male   DOB: March 12, 1933, 81 y.o.   MRN: [id number] Subjective: 2 Days Post-Op Procedure(s) (LRB): RIGHT TOTAL ANKLE ARTHOPLASTY (Right)    Patient reports pain as mild to moderate, pain seems to be better controlled at this time.  Ready to head home  Objective:   VITALS:   Filed Vitals:   04/19/14 0443  BP: 115/68  Pulse: 80  Temp: 99.5 F (37.5 C)  Resp: 18    Neurovascular intact RLE short leg cast clean and intact  LABS No results for input(s): HGB, HCT, WBC, PLT in the last 72 hours.  No results for input(s): NA, K, BUN, CREATININE, GLUCOSE in the last 72 hours.  No results for input(s): LABPT, INR in the last 72 hours.   Assessment/Plan: 2 Days Post-Op Procedure(s) (LRB): RIGHT TOTAL ANKLE ARTHOPLASTY (Right)   Up with therapy Discharge home today after all arrangements made  Rx on chart NWB RLE until further directed by [facility]

## 2014-04-20 NOTE — Discharge Summary (Signed)
Physician Discharge Summary  Patient ID: [name] MRN: [id number] DOB/AGE: 09-19-1933 81 y.o.  Admit date: 04/17/2014 Discharge date: 04/20/2014  Admission Diagnoses:  A fib, CAD, R ankle post traumatic arthritis  Discharge Diagnoses:  Active Problems:   Arthritis of right ankle right ankle lateral ligament instability same s/p right total ankle arthroplasty  Discharged Condition: stable  Hospital Course: The patient was admitted on 2/18 and taken to the OR where he underwent right ankle replacement and reconstruction of the lateral ankle ligaments.  He tolerated this procedure well and was transferred to 5N where he remained until discharge.  He did well with PT and was discharged to home in stable condition.  He is NWB on the R LE.  Consults: None  Significant Diagnostic Studies: none  Treatments: surgery: as above  Discharge Exam: Blood pressure 129/72, pulse 69, temperature 99.5 F (37.5 C), temperature source Oral, resp. rate 18, height 6' (1.829 m), weight 112.946 kg (249 lb), SpO2 98 %. wn wd male in nad.  A and O x 4.  Mood and affect normal.  EOMI.  Resp unlabored.  R LE immobilized in a cast.  Disposition: 01-Home or Self Care  Discharge Instructions    Call MD / Call 911    Complete by:  As directed   If you experience chest pain or shortness of breath, CALL 911 and be transported to the hospital emergency room.  If you develope a fever above 101 F, pus (white drainage) or increased drainage or redness at the wound, or calf pain, call your surgeon's office.     Call MD / Call 911    Complete by:  As directed   If you experience chest pain or shortness of breath, CALL 911 and be transported to the hospital emergency room.  If you develope a fever above 101 F, pus (white drainage) or increased drainage or redness at the wound, or calf pain, call your surgeon's office.     Constipation Prevention    Complete by:  As directed   Drink plenty of fluids.  Prune juice  may be helpful.  You may use a stool softener, such as Colace (over the counter) 100 mg twice a day.  Use MiraLax (over the counter) for constipation as needed.     Constipation Prevention    Complete by:  As directed   Drink plenty of fluids.  Prune juice may be helpful.  You may use a stool softener, such as Colace (over the counter) 100 mg twice a day.  Use MiraLax (over the counter) for constipation as needed.     Diet - low sodium heart healthy    Complete by:  As directed      Discharge instructions    Complete by:  As directed   As directed by Dr. Doran Durand NWB right lower extremity Keep cast dry     Driving restrictions    Complete by:  As directed   No driving until permitted by Dr. Doran Durand     Increase activity slowly as tolerated    Complete by:  As directed      Increase activity slowly as tolerated    Complete by:  As directed      Non weight bearing    Complete by:  As directed   Laterality:  right  Extremity:  Lower            Medication List    TAKE these medications  acetaminophen 325 MG tablet  Commonly known as:  TYLENOL  Take 2 tablets (650 mg total) by mouth every 6 (six) hours as needed for headache or mild pain.     apixaban 5 MG Tabs tablet  Commonly known as:  ELIQUIS  Take 1 tablet (5 mg total) by mouth 2 (two) times daily.     atorvastatin 40 MG tablet  Commonly known as:  LIPITOR  Take 1 tablet (40 mg total) by mouth daily at 6 PM.     docusate sodium 100 MG capsule  Commonly known as:  COLACE  Take 1 capsule (100 mg total) by mouth 2 (two) times daily.     HYDROmorphone 4 MG tablet  Commonly known as:  DILAUDID  Take 1 tablet (4 mg total) by mouth every 4 (four) hours as needed for moderate pain or severe pain.     levothyroxine 137 MCG tablet  Commonly known as:  SYNTHROID, LEVOTHROID  Take 1 tablet (137 mcg total) by mouth daily before breakfast.     metoprolol succinate 100 MG 24 hr tablet  Commonly known as:  TOPROL-XL  Take  1 tablet (100 mg total) by mouth daily. Take with or immediately following a meal.     ramipril 5 MG capsule  Commonly known as:  ALTACE  Take 1 capsule (5 mg total) by mouth 2 (two) times daily.     senna 8.6 MG Tabs tablet  Commonly known as:  SENOKOT  Take 1 tablet (8.6 mg total) by mouth 2 (two) times daily.     sildenafil 100 MG tablet  Commonly known as:  VIAGRA  Take 0.5-1 tablets (50-100 mg total) by mouth daily as needed for erectile dysfunction.           Follow-up Information    Follow up with Lashya Passe, Jenny Reichmann, MD. Schedule an appointment as soon as possible for a visit in 3 weeks.   Specialty:  Orthopedic Surgery   Contact information:   [address] [place] [address] [phone]       Follow up In 10 days.   Why:  For cast/ wound check      Signed: Wylene Simmer 04/20/2014, 10:42 PM

## 2014-04-27 ENCOUNTER — Other Ambulatory Visit: Payer: Self-pay | Admitting: Physician Assistant

## 2014-04-30 ENCOUNTER — Other Ambulatory Visit: Payer: Self-pay | Admitting: Physician Assistant

## 2014-05-07 DIAGNOSIS — Z4789 Encounter for other orthopedic aftercare: Secondary | ICD-10-CM | POA: Diagnosis not present

## 2014-05-07 DIAGNOSIS — M19071 Primary osteoarthritis, right ankle and foot: Secondary | ICD-10-CM | POA: Diagnosis not present

## 2014-05-08 ENCOUNTER — Other Ambulatory Visit: Payer: Self-pay | Admitting: Physician Assistant

## 2014-05-09 ENCOUNTER — Ambulatory Visit: Payer: Medicare Other | Admitting: Family

## 2014-05-09 ENCOUNTER — Other Ambulatory Visit (HOSPITAL_COMMUNITY): Payer: Medicare Other

## 2014-05-15 ENCOUNTER — Encounter: Payer: Self-pay | Admitting: Family

## 2014-05-16 ENCOUNTER — Ambulatory Visit (HOSPITAL_COMMUNITY)
Admission: RE | Admit: 2014-05-16 | Discharge: 2014-05-16 | Disposition: A | Discharge status: Home / Self Care | Payer: Medicare Other | Source: Ambulatory Visit | Attending: Family | Admitting: Family

## 2014-05-16 ENCOUNTER — Encounter: Payer: Self-pay | Admitting: Family

## 2014-05-16 ENCOUNTER — Ambulatory Visit (INDEPENDENT_AMBULATORY_CARE_PROVIDER_SITE_OTHER): Payer: Medicare Other | Admitting: Family

## 2014-05-16 VITALS — BP 137/89 | HR 89 | Resp 16 | Ht 72.0 in | Wt 239.0 lb

## 2014-05-16 DIAGNOSIS — Z87891 Personal history of nicotine dependence: Secondary | ICD-10-CM | POA: Diagnosis not present

## 2014-05-16 DIAGNOSIS — I714 Abdominal aortic aneurysm, without rupture, unspecified: Secondary | ICD-10-CM

## 2014-05-16 DIAGNOSIS — Z48812 Encounter for surgical aftercare following surgery on the circulatory system: Secondary | ICD-10-CM | POA: Insufficient documentation

## 2014-05-16 DIAGNOSIS — Z95828 Presence of other vascular implants and grafts: Secondary | ICD-10-CM

## 2014-05-16 DIAGNOSIS — I2510 Atherosclerotic heart disease of native coronary artery without angina pectoris: Secondary | ICD-10-CM

## 2014-05-16 NOTE — Patient Instructions (Signed)
Abdominal Aortic Aneurysm An aneurysm is a weakened or damaged part of an artery wall that bulges from the normal force of blood pumping through the body. An abdominal aortic aneurysm is an aneurysm that occurs in the lower part of the aorta, the main artery of the body.  The major concern with an abdominal aortic aneurysm is that it can enlarge and burst (rupture) or blood can flow between the layers of the wall of the aorta through a tear (aorticdissection). Both of these conditions can cause bleeding inside the body and can be life threatening unless diagnosed and treated promptly. CAUSES  The exact cause of an abdominal aortic aneurysm is unknown. Some contributing factors are:   A hardening of the arteries caused by the buildup of fat and other substances in the lining of a blood vessel (arteriosclerosis).  Inflammation of the walls of an artery (arteritis).   Connective tissue diseases, such as Marfan syndrome.   Abdominal trauma.   An infection, such as syphilis or staphylococcus, in the wall of the aorta (infectious aortitis) caused by bacteria. RISK FACTORS  Risk factors that contribute to an abdominal aortic aneurysm may include:  Age older than 60 years.   High blood pressure (hypertension).  Male gender.  Ethnicity (white race).  Obesity.  Family history of aneurysm (first degree relatives only).  Tobacco use. PREVENTION  The following healthy lifestyle habits may help decrease your risk of abdominal aortic aneurysm:  Quitting smoking. Smoking can raise your blood pressure and cause arteriosclerosis.  Limiting or avoiding alcohol.  Keeping your blood pressure, blood sugar level, and cholesterol levels within normal limits.  Decreasing your salt intake. In somepeople, too much salt can raise blood pressure and increase your risk of abdominal aortic aneurysm.  Eating a diet low in saturated fats and cholesterol.  Increasing your fiber intake by including  whole grains, vegetables, and fruits in your diet. Eating these foods may help lower blood pressure.  Maintaining a healthy weight.  Staying physically active and exercising regularly. SYMPTOMS  The symptoms of abdominal aortic aneurysm may vary depending on the size and rate of growth of the aneurysm.Most grow slowly and do not have any symptoms. When symptoms do occur, they may include:  Pain (abdomen, side, lower back, or groin). The pain may vary in intensity. A sudden onset of severe pain may indicate that the aneurysm has ruptured.  Feeling full after eating only small amounts of food.  Nausea or vomiting or both.  Feeling a pulsating lump in the abdomen.  Feeling faint or passing out. DIAGNOSIS  Since most unruptured abdominal aortic aneurysms have no symptoms, they are often discovered during diagnostic exams for other conditions. An aneurysm may be found during the following procedures:  Ultrasonography (A one-time screening for abdominal aortic aneurysm by ultrasonography is also recommended for all men aged 65-75 years who have ever smoked).  X-ray exams.  A computed tomography (CT).  Magnetic resonance imaging (MRI).  Angiography or arteriography. TREATMENT  Treatment of an abdominal aortic aneurysm depends on the size of your aneurysm, your age, and risk factors for rupture. Medication to control blood pressure and pain may be used to manage aneurysms smaller than 6 cm. Regular monitoring for enlargement may be recommended by your caregiver if:  The aneurysm is 3-4 cm in size (an annual ultrasonography may be recommended).  The aneurysm is 4-4.5 cm in size (an ultrasonography every 6 months may be recommended).  The aneurysm is larger than 4.5 cm in   size (your caregiver may ask that you be examined by a vascular surgeon). If your aneurysm is larger than 6 cm, surgical repair may be recommended. There are two main methods for repair of an aneurysm:   Endovascular  repair (a minimally invasive surgery). This is done most often.  Open repair. This method is used if an endovascular repair is not possible. Document Released: 11/24/2004 Document Revised: 06/11/2012 Document Reviewed: 03/16/2012 ExitCare Patient Information 2015 [employer]. This information is not intended to replace advice given to you by your health care provider. Make sure you discuss any questions you have with your health care provider.  

## 2014-05-16 NOTE — Progress Notes (Signed)
[facility]  Established EVAR  History of Present Illness  [name] is a 81 y.o. (April 19, 1933) male patient of Dr. Bridgett Larsson who presents for routine follow up s/p EVAR (Date: 02/15/11). Most recent EVAR duplex (Date: 11/04/11) demonstrates: no endoleak and stable sac size. Most recent CTA (Date: 05/03/2013) demonstrates: no endoleak and stable decrease sac size. The patient has not had back or abdominal pain.  Pt denies any history of stroke or TIA, denies claudication symptoms with walking.  He had an ankle replacement 04/17/14 due to arthritis, Dr. Wylene Simmer, [facility].  Pt states his blood pressure increases in a medical office, usually runs about 135/60's.  Pt denies any kidney problems.  He takes Eliquis for atrial fib. He takes a statin.  Pt Diabetic: No Pt smoker: former smoker, quit in 1992   Past Medical History  Diagnosis Date  . Hyperglycemia diet controlled  . Obesity   . Hyperlipidemia   . Organic impotence   . Hypertension   . Hematuria bladder tumor ---  followed by Dr Karsten Ro  . S/P AAA repair using bifurcation graft   . Sleep apnea no rx cpap    mild- tested  2012  . Coronary artery disease s/p cabg x7  1992    a. s/p CABG in 1992 (X 7 - Dr. Redmond Pulling);  b.  Myoview (01/2011): Fixed inferior defect suggestive of prior MI versus diaphragmatic attenuation, apical reversibility possibly suspicious for small area of infarction with peri-infarct ischemia, no significant change from prior study, not gated, low risk study;  c.  Echo (05/2010): Mod LVH, EF 55-65%, mild AI, mild MR, moderate LAE, mild RAE, moderate   . Ventricular ectopy     mild  . Atrial fibrillation     a. permanent;  b. Hematuria on Pradaxa;  c. cold intol and chills with Xarelto  . Chronic kidney disease   . Hypothyroidism   . COPD (chronic obstructive pulmonary disease)   . Cardiomyopathy     a. Echo (03/2013): Inferior and inferolateral AK, EF 40-45%,  mild MR, mod LAE, mod RAE, mod TR, mild PI, PASP 46  . Hx of cardiovascular stress test     Lexiscan Myoview (03/2013):  No ischemia; not gated.  Marland Kitchen Dysrhythmia   . Bladder cancer   . AAA (abdominal aortic aneurysm)     s/p stent graft repair 2012  . Arthritis     "right ankle" (04/17/2014)  . Hiatal hernia     repaired in 1992 w/O[facility]   Past Surgical History  Procedure Laterality Date  . Cardiac catheterization  03-1990    Positive  . [place]  10/31/00    WNL  . [place]  03/28/02  . Endovascular stent insertion  02/15/2011    Procedure: ENDOVASCULAR STENT GRAFT INSERTION;  Surgeon: Hinda Lenis, MD;  Location: [facility];  Service: Vascular;  Laterality: N/A;  Insertion of endovascular stent graft   . Cardiovascular stress test  02-01-2011  NUCLEAR NO EXERCISE    LOW RISK STUDY.  SMALL APICAL DEFECT. NO EVIDENCE OF MULTIZONE ISCHEMIA.  . Transurethral resection of bladder tumor  03/25/2011    Procedure: TRANSURETHRAL RESECTION OF BLADDER TUMOR (TURBT);  Surgeon: Claybon Jabs, MD;  Location[facility];  Service: Urology;  Laterality: N/A;  . Total ankle replacement Right 04/17/2014  . Inguinal hernia repair Left 1997  . Coronary artery bypass graft  05/1990    CABG X 7  . Abdominal  aortic aneurysm repair  2012    using bifurcation graft  . Hiatal hernia repair  1992    "repaired when I had my OHS"  . Total ankle arthroplasty Right 04/17/2014    Procedure: RIGHT TOTAL ANKLE ARTHOPLASTY;  Surgeon: Wylene Simmer, MD;  Location: [facility];  Service: Orthopedics;  Laterality: Right;  . Joint replacement Right 04-17-14    Ankle   Social History History  Substance Use Topics  . Smoking status: Former Smoker -- 1.25 packs/day for 30 years    Types: Cigarettes    Quit date: 02/28/1990  . Smokeless tobacco: Never Used  . Alcohol Use: Yes     Comment: 04/17/2014 "might drink a 6 pack of beer/year"   Family History Family History  Problem Relation Age of Onset  . Obesity  Mother 22    deceased  . Hypertension Mother   . Diabetes Mother   . Heart disease Mother     before age 4  . Hyperlipidemia Mother   . Alcohol abuse Sister 79    deceased  . Other Father     deceased unknown  . Colon cancer Neg Hx   . Prostate cancer Neg Hx   . Anesthesia problems Neg Hx    Current Outpatient Prescriptions on File Prior to Visit  Medication Sig Dispense Refill  . apixaban (ELIQUIS) 5 MG TABS tablet Take 1 tablet (5 mg total) by mouth 2 (two) times daily. 60 tablet 11  . atorvastatin (LIPITOR) 40 MG tablet Take 1 tablet (40 mg total) by mouth daily at 6 PM. 90 tablet 3  . levothyroxine (SYNTHROID, LEVOTHROID) 137 MCG tablet Take 1 tablet (137 mcg total) by mouth daily before breakfast. 90 tablet 3  . metoprolol succinate (TOPROL-XL) 100 MG 24 hr tablet TAKE 1 TABLET (100 MG TOTAL) BY MOUTH DAILY. TAKE WITH OR IMMEDIATELY FOLLOWING A MEAL. 30 tablet 10  . ramipril (ALTACE) 5 MG capsule TAKE 1 CAPSULE (5 MG TOTAL) BY MOUTH 2 (TWO) TIMES DAILY. 60 capsule 6  . sildenafil (VIAGRA) 100 MG tablet Take 0.5-1 tablets (50-100 mg total) by mouth daily as needed for erectile dysfunction. 5 tablet 11  . acetaminophen (TYLENOL) 325 MG tablet Take 2 tablets (650 mg total) by mouth every 6 (six) hours as needed for headache or mild pain. (Patient not taking: Reported on 05/16/2014) 50 tablet 0  . docusate sodium (COLACE) 100 MG capsule Take 1 capsule (100 mg total) by mouth 2 (two) times daily. (Patient not taking: Reported on 05/16/2014) 20 capsule 0  . HYDROmorphone (DILAUDID) 4 MG tablet Take 1 tablet (4 mg total) by mouth every 4 (four) hours as needed for moderate pain or severe pain. (Patient not taking: Reported on 05/16/2014) 30 tablet 0  . senna (SENOKOT) 8.6 MG TABS tablet Take 1 tablet (8.6 mg total) by mouth 2 (two) times daily. (Patient not taking: Reported on 05/16/2014) 40 each 0   No current facility-administered medications on file prior to visit.   Allergies  Allergen  Reactions  . Xarelto [Rivaroxaban] Shortness Of Breath and Other (See Comments)    SOB, CHILLS  . Dabigatran Etexilate Mesylate     Blood in urine     ROS: See HPI for pertinent positives and negatives.  Physical Examination  Filed Vitals:   05/16/14 0936  BP: 137/89  Pulse: 89  Resp: 16  Height: 6' (1.829 m)  Weight: 239 lb (108.41 kg)  SpO2: 99%   Body mass index is 32.41 kg/(m^2).  General:  A&O x 3, WD, obese male   Pulmonary: Sym exp, good air movt, CTAB, no rales, rhonchi, & wheezing  Cardiac: RRR, Nl S1, S2, no detected murmur  Vascular: Vessel Right Left  Aorta Not palpable N/A  Femoral Palpable Palpable  Popliteal Not palpable Not palpable  PT Not Palpable Not Palpable  DP Not Palpable Not Palpable   Gastrointestinal: soft, NTND, -G/R, - HSM, - masses, - CVAT B  Musculoskeletal: M/S 5/5 throughout , Extremities without ischemic changes. Healing incision anterior right ankle, right ankle with 1-2+ pitting and non pitting edema.  Neurologic: Pain and light touch intact in extremities , Motor exam as listed above          Non-Invasive Vascular Imaging  EVAR Duplex (Date: 05/16/2014) ABDOMINAL AORTA DUPLEX EVALUATION - POST ENDOVASCULAR REPAIR    INDICATION: Abdominal aortic aneurysm    PREVIOUS INTERVENTION(S): Endovascular Aortic Repair performed 02/15/2011    DUPLEX EXAM:      DIAMETER AP (cm) DIAMETER TRANSVERSE (cm) VELOCITIES (cm/sec)  Aorta 4.63 4.83 50  Right Common Iliac 1.45 1.62 75  Left Common Iliac 1.65 1.99 68    Comparison Study       Date DIAMETER AP (cm) DIAMETER TRANSVERSE (cm)  11/02/2012 6.0 6.6     ADDITIONAL FINDINGS:     IMPRESSION: Abdominal aortic sac present measuring 4.63cm AP x 4.83cm TRV. Patent proximal limbs of Endovascular Aortic Repair present.    Compared to the previous exam:  Decrease in abdominal aortic sac size since previous study on 11/02/2012.     Medical Decision  Making  [name] is a 81 y.o. male who presents s/p EVAR (Date: 02/15/2011).  Pt is asymptomatic with a decrease in sac size.  I discussed with the patient the importance of surveillance of the endograft.   The next CTA will be scheduled for 5 years after repair which will be at the end of 2017.  The patient will follow up with [place] in 18 months with these studies.  I emphasized the importance of maximal medical management including strict control of blood pressure, blood glucose, and lipid levels, antiplatelet agents, obtaining regular exercise, and cessation of smoking.   The patient was given information about AAA including signs, symptoms, treatment, and how to minimize the risk of enlargement and rupture of aneurysms.    Thank you for allowing [place] to participate in this patient's care.  Clemon Chambers, RN, MSN, FNP-C [facility] Office: [phone]  Clinic Physician: Early on call  05/16/2014, 9:47 AM

## 2014-05-21 ENCOUNTER — Other Ambulatory Visit: Payer: Self-pay | Admitting: Physician Assistant

## 2014-06-02 DIAGNOSIS — M19071 Primary osteoarthritis, right ankle and foot: Secondary | ICD-10-CM | POA: Diagnosis not present

## 2014-06-02 DIAGNOSIS — Z4789 Encounter for other orthopedic aftercare: Secondary | ICD-10-CM | POA: Diagnosis not present

## 2014-06-10 ENCOUNTER — Other Ambulatory Visit: Payer: Self-pay | Admitting: *Deleted

## 2014-06-10 MED ORDER — METOPROLOL SUCCINATE ER 100 MG PO TB24: ORAL_TABLET | ORAL | Status: DC

## 2014-07-14 DIAGNOSIS — Z4789 Encounter for other orthopedic aftercare: Secondary | ICD-10-CM | POA: Diagnosis not present

## 2014-07-18 DIAGNOSIS — M19071 Primary osteoarthritis, right ankle and foot: Secondary | ICD-10-CM | POA: Diagnosis not present

## 2014-09-17 ENCOUNTER — Telehealth: Payer: Self-pay

## 2014-09-17 MED ORDER — SILDENAFIL CITRATE 20 MG PO TABS: 60.0000 mg | ORAL_TABLET | Freq: Every day | ORAL | Status: DC | PRN

## 2014-09-17 NOTE — Telephone Encounter (Signed)
Patient notified by telephone that script has been sent to the pharmacy. 

## 2014-09-17 NOTE — Telephone Encounter (Signed)
Sent. Thanks.   

## 2014-09-17 NOTE — Telephone Encounter (Signed)
Pt left note requesting new rx sent to [facility] for sildenafil 20 mg instead of viagra. pts last annual exam 11/22/13.; [facility] has sildenafil 20 mg # 50 for $80.00.

## 2014-10-01 DIAGNOSIS — L298 Other pruritus: Secondary | ICD-10-CM | POA: Diagnosis not present

## 2014-10-01 DIAGNOSIS — Z8551 Personal history of malignant neoplasm of bladder: Secondary | ICD-10-CM | POA: Diagnosis not present

## 2014-10-10 ENCOUNTER — Other Ambulatory Visit: Payer: Self-pay | Admitting: Cardiology

## 2014-11-17 ENCOUNTER — Other Ambulatory Visit: Payer: Self-pay | Admitting: Family Medicine

## 2014-11-17 DIAGNOSIS — I480 Paroxysmal atrial fibrillation: Secondary | ICD-10-CM

## 2014-11-17 DIAGNOSIS — I10 Essential (primary) hypertension: Secondary | ICD-10-CM

## 2014-11-17 DIAGNOSIS — E039 Hypothyroidism, unspecified: Secondary | ICD-10-CM

## 2014-11-18 ENCOUNTER — Other Ambulatory Visit (INDEPENDENT_AMBULATORY_CARE_PROVIDER_SITE_OTHER): Payer: Medicare Other

## 2014-11-18 DIAGNOSIS — I10 Essential (primary) hypertension: Secondary | ICD-10-CM

## 2014-11-18 DIAGNOSIS — I480 Paroxysmal atrial fibrillation: Secondary | ICD-10-CM

## 2014-11-18 LAB — COMPREHENSIVE METABOLIC PANEL
ALBUMIN: 4.1 g/dL (ref 3.5–5.2)
ALT: 11 U/L (ref 0–53)
AST: 14 U/L (ref 0–37)
Alkaline Phosphatase: 76 U/L (ref 39–117)
BUN: 15 mg/dL (ref 6–23)
CHLORIDE: 103 meq/L (ref 96–112)
CO2: 29 meq/L (ref 19–32)
Calcium: 9.7 mg/dL (ref 8.4–10.5)
Creatinine, Ser: 1.04 mg/dL (ref 0.40–1.50)
GFR: 73.27 mL/min (ref >60.00)
Glucose, Bld: 98 mg/dL (ref 70–99)
POTASSIUM: 4.5 meq/L (ref 3.5–5.1)
Sodium: 140 mEq/L (ref 135–145)
Total Bilirubin: 0.8 mg/dL (ref 0.2–1.2)
Total Protein: 8 g/dL (ref 6.0–8.3)

## 2014-11-18 LAB — CBC WITH DIFFERENTIAL/PLATELET
BASOS PCT: 0.6 % (ref 0.0–3.0)
Basophils Absolute: 0 10*3/uL (ref 0.0–0.1)
EOS ABS: 0.1 10*3/uL (ref 0.0–0.7)
EOS PCT: 1.7 % (ref 0.0–5.0)
HEMATOCRIT: 46.2 % (ref 39.0–52.0)
Hemoglobin: 15.4 g/dL (ref 13.0–17.0)
LYMPHS PCT: 35.4 % (ref 12.0–46.0)
Lymphs Abs: 2.9 10*3/uL (ref 0.7–4.0)
MCHC: 33.3 g/dL (ref 30.0–36.0)
MCV: 89 fl (ref 78.0–100.0)
MONO ABS: 0.6 10*3/uL (ref 0.1–1.0)
Monocytes Relative: 7.1 % (ref 3.0–12.0)
NEUTROS ABS: 4.6 10*3/uL (ref 1.4–7.7)
Neutrophils Relative %: 55.2 % (ref 43.0–77.0)
PLATELETS: 266 10*3/uL (ref 150.0–400.0)
RBC: 5.19 Mil/uL (ref 4.22–5.81)
RDW: 15.6 % — AB (ref 11.5–15.5)
WBC: 8.3 10*3/uL (ref 4.0–10.5)

## 2014-11-18 LAB — LIPID PANEL
CHOL/HDL RATIO: 4
CHOLESTEROL: 166 mg/dL (ref 0–200)
HDL: 37.2 mg/dL — ABNORMAL LOW (ref >39.00)
LDL Cholesterol: 93 mg/dL (ref 0–99)
NonHDL: 128.66
Triglycerides: 179 mg/dL — ABNORMAL HIGH (ref 0.0–149.0)
VLDL: 35.8 mg/dL (ref 0.0–40.0)

## 2014-11-18 LAB — TSH: TSH: 4.83 u[IU]/mL — ABNORMAL HIGH (ref 0.35–4.50)

## 2014-11-24 ENCOUNTER — Encounter: Payer: Medicare Other | Admitting: Family Medicine

## 2014-11-26 ENCOUNTER — Encounter: Payer: Self-pay | Admitting: Family Medicine

## 2014-11-26 ENCOUNTER — Ambulatory Visit (INDEPENDENT_AMBULATORY_CARE_PROVIDER_SITE_OTHER): Payer: Medicare Other | Admitting: Family Medicine

## 2014-11-26 VITALS — BP 160/90 | HR 66 | Temp 98.5°F | Ht 70.75 in | Wt 240.5 lb

## 2014-11-26 DIAGNOSIS — Z125 Encounter for screening for malignant neoplasm of prostate: Secondary | ICD-10-CM | POA: Diagnosis not present

## 2014-11-26 DIAGNOSIS — Z23 Encounter for immunization: Secondary | ICD-10-CM

## 2014-11-26 DIAGNOSIS — E039 Hypothyroidism, unspecified: Secondary | ICD-10-CM

## 2014-11-26 DIAGNOSIS — Z0000 Encounter for general adult medical examination without abnormal findings: Secondary | ICD-10-CM

## 2014-11-26 DIAGNOSIS — E785 Hyperlipidemia, unspecified: Secondary | ICD-10-CM

## 2014-11-26 DIAGNOSIS — I2510 Atherosclerotic heart disease of native coronary artery without angina pectoris: Secondary | ICD-10-CM

## 2014-11-26 DIAGNOSIS — I10 Essential (primary) hypertension: Secondary | ICD-10-CM

## 2014-11-26 DIAGNOSIS — I480 Paroxysmal atrial fibrillation: Secondary | ICD-10-CM

## 2014-11-26 NOTE — Progress Notes (Signed)
Pre visit review using our clinic review tool, if applicable. No additional management support is needed unless otherwise documented below in the visit note.  I have personally reviewed the Medicare Annual Wellness questionnaire and have noted 1. The patient's medical and social history 2. Their use of alcohol, tobacco or illicit drugs 3. Their current medications and supplements 4. The patient's functional ability including ADL's, fall risks, home safety risks and hearing or visual             impairment. 5. Diet and physical activities 6. Evidence for depression or mood disorders  The patients weight, height, BMI have been recorded in the chart and visual acuity is per eye clinic.  I have made referrals, counseling and provided education to the patient based review of the above and I have provided the pt with a written personalized care plan for preventive services.  Provider list updated- see scanned forms.  Routine anticipatory guidance given to patient.  See health maintenance.  Flu 2016 Shingles 2012 PNA 2016 Tetanus 2008 Colonoscopy 2007 Prostate cancer screening and PSA options (with potential risks and benefits of testing vs not testing) were discussed along with recent recs/guidelines.  He opted for testing PSA at this point. We can draw with next set of labs.   Advance directive- daughter and son designated if patient were incapacitated.  Cognitive function addressed- see scanned forms- and if abnormal then additional documentation follows.   Hypothyroid.  No neck mass, dysphagia.  TSH minimally up.  D/w pt.    Hypertension:    Using medication without problems or lightheadedness: yes Chest pain with exertion:no Edema:none except related to prev surgery.   Short of breath:no BP is usually lower at home than here.  This is longstanding per patient.   Elevated Cholesterol: Using medications without problems:yes Muscle aches: no Diet compliance:yes, he is working on  diet.   Exercise: as tolerated with ankle surgery prev.   H/o AAA s/p repair per vascular clinic.    PMH and SH reviewed  Meds, vitals, and allergies reviewed.   ROS: See HPI.  Otherwise negative.    GEN: nad, alert and oriented HEENT: mucous membranes moist NECK: supple w/o LA, no tmg CV: IRR, not tachy PULM: ctab, no inc wob ABD: soft, +bs EXT: no edema SKIN: no acute rash

## 2014-11-26 NOTE — Patient Instructions (Addendum)
Recheck labs in about 6 months (lab visit only). Keep working on your diet and keep exercising.  Physical in about 1 year, labs ahead of time for that visit.  Take care.  Glad to see you.  Don't change your meds for now.

## 2014-11-27 ENCOUNTER — Ambulatory Visit (INDEPENDENT_AMBULATORY_CARE_PROVIDER_SITE_OTHER): Payer: Medicare Other | Admitting: Cardiology

## 2014-11-27 ENCOUNTER — Encounter: Payer: Self-pay | Admitting: Cardiology

## 2014-11-27 VITALS — BP 124/88 | HR 73 | Ht 70.75 in | Wt 240.0 lb

## 2014-11-27 DIAGNOSIS — I5022 Chronic systolic (congestive) heart failure: Secondary | ICD-10-CM

## 2014-11-27 DIAGNOSIS — I10 Essential (primary) hypertension: Secondary | ICD-10-CM

## 2014-11-27 DIAGNOSIS — I2510 Atherosclerotic heart disease of native coronary artery without angina pectoris: Secondary | ICD-10-CM

## 2014-11-27 DIAGNOSIS — I482 Chronic atrial fibrillation: Secondary | ICD-10-CM

## 2014-11-27 DIAGNOSIS — I4821 Permanent atrial fibrillation: Secondary | ICD-10-CM

## 2014-11-27 MED ORDER — ATORVASTATIN CALCIUM 80 MG PO TABS: 80.0000 mg | ORAL_TABLET | Freq: Every day | ORAL | Status: DC

## 2014-11-27 NOTE — Patient Instructions (Signed)
Medication Instructions:  Increase atorvastatin to 80mg  daily  Labwork: Your physician recommends that you return for a FASTING lipid profile/liver profile in 2 months.   Testing/Procedures: None today  Follow-Up: Your physician wants you to follow-up in: 1 year with Dr Aundra Dubin. (September 2017).  You will receive a reminder letter in the mail two months in advance. If you don't receive a letter, please call our office to schedule the follow-up appointment.

## 2014-11-28 NOTE — Assessment & Plan Note (Signed)
Flu 2016 Shingles 2012 PNA 2016 Tetanus 2008 Colonoscopy 2007 Prostate cancer screening and PSA options (with potential risks and benefits of testing vs not testing) were discussed along with recent recs/guidelines.  He opted for testing PSA at this point. We can draw with next set of labs.   Advance directive- daughter and son designated if patient were incapacitated.  Cognitive function addressed- see scanned forms- and if abnormal then additional documentation follows.

## 2014-11-28 NOTE — Assessment & Plan Note (Signed)
Usually controlled at home. Continue meds as is.  Needs work on diet and weight, d/w pt.

## 2014-11-28 NOTE — Assessment & Plan Note (Signed)
Labs d/w pt, elevated TG noted Continue med as is.  Needs work on diet and weight, d/w pt.

## 2014-11-28 NOTE — Assessment & Plan Note (Signed)
Minimal inc in TSH, can recheck with next set of labs in about 6 months.  Continue as is for now.  He agrees.

## 2014-11-28 NOTE — Assessment & Plan Note (Signed)
Would continue anticoagulation for now.

## 2014-11-29 DIAGNOSIS — I5022 Chronic systolic (congestive) heart failure: Secondary | ICD-10-CM | POA: Insufficient documentation

## 2014-11-29 NOTE — Progress Notes (Signed)
Patient ID: [name], male   DOB: May 18, 1933, 81 y.o.   MRN: [id number] PCP: Dr. Damita Dunnings  81 yo with history of chronic atrial fibrillation, CAD s/p CABG, AAA repair, and CKD presents for cardiology followup.  Patient had CABG in 1992 and has not had a cardiac cath since that time.  Last echo in 2/15 showed inferior and posterior hypokinesis with EF 40-45%.  Lexiscan Cardiolite in 2/15 showed no ischemia or infarction.  He denies chest pain or exertional dyspnea.  He has had chronic atrial fibrillation for several years.  He does not feel tachypalpitations and rate is controlled.  No BRBPR or melena on Eliquis.   He had total right ankle arthroplasty in 9/24 with no complications.  He is walking much easier now.  Weight is down 10 lbs with more activity.  U[facility] the exercise bike at [facility].    Labs (3/14): K 4.3, creatinine 1.0, HCT 42.9 Labs (10/14): LDL 111, HDL 41 Labs (2/15): LDL 66, HDL 19, AST 46 => normal, ALT 66 => normal Labs (3/15): creatinine 0.9 Labs (9/15): K 4.4, creatinine 1.1, HCT 45.4, TSH normal, LDL 86, HDL 34 Labs (9/16): K 4.5, creatinine 1.04, LDL 93, HDL 37, HCT 46.2  ECG: atrial fibrillation, nonspecific T wave flattening  PMH: 1. Chronic atrial fibrillation: Hematuria on Pradaxa.  2. CAD: CABG x 7 in 1992 (Dr. Redmond Pulling).  Lexiscan Cardiolite (2/15): not gated, no ischemia or infarction.  3. AAA repair: stent graft.  4. CKD 5. OSA: Mild 6. Hypothyroidism 7. HTN 8. Bladder tumor 9. COPD 10. Ischemic cardiomyopathy: EF 40-45% with inferior and posterior hypokinesis, PA systolic pressure 46 mmHg.   SH: Quit smoking in 1992, lives in [place] alone, widower, 3 children.   FH: No premature CAD.   ROS: All systems reviewed and negative except as per HPI.   Current Outpatient Prescriptions  Medication Sig Dispense Refill  . ELIQUIS 5 MG TABS tablet TAKE 1 TABLET (5 MG TOTAL) BY MOUTH 2 (TWO) TIMES DAILY. 60 tablet 7  . levothyroxine (SYNTHROID, LEVOTHROID) 137 MCG  tablet Take 1 tablet (137 mcg total) by mouth daily before breakfast. 90 tablet 3  . metoprolol succinate (TOPROL-XL) 100 MG 24 hr tablet TAKE 1 TABLET (100 MG TOTAL) BY MOUTH DAILY. TAKE WITH OR IMMEDIATELY FOLLOWING A MEAL. 90 tablet 3  . ramipril (ALTACE) 5 MG capsule TAKE 1 CAPSULE (5 MG TOTAL) BY MOUTH 2 (TWO) TIMES DAILY. 60 capsule 6  . sildenafil (REVATIO) 20 MG tablet Take 3-5 tablets (60-100 mg total) by mouth daily as needed. 50 tablet 5  . atorvastatin (LIPITOR) 80 MG tablet Take 1 tablet (80 mg total) by mouth daily. 90 tablet 0   No current facility-administered medications for this visit.   BP 124/88 mmHg  Pulse 73  Ht 5' 10.75" (1.797 m)  Wt 240 lb (108.863 kg)  BMI 33.71 kg/m2 General: NAD Neck: JVP 7 cm, no thyromegaly or thyroid nodule.  Lungs: Clear to auscultation bilaterally with normal respiratory effort. CV: Nondisplaced PMI.  Heart irregular S1/S2, no S3/S4, no murmur.  1+ right ankl edema.  No carotid bruit.  Normal pedal pulses.  Abdomen: Soft, nontender, no hepatosplenomegaly, no distention.  Skin: Intact without lesions or rashes.  Neurologic: Alert and oriented x 3.  Psych: Normal affect. Extremities: No clubbing or cyanosis.  Varicose veins lower legs.   Assessment/Plan: 1. CAD: s/p CABG.  No ischemic symptoms.  Lexiscan Cardiolite in 2/15 showed no ischemia or infarction. Continue statin, ACEI.  He is not on aspirin because he has been on apixaban.  2. Hyperlipidemia: LDL too high.  Increase atorvastatin to 80 mg daily with lipids/LFTs in 2 months.   3. Chronic atrial fibrillation: Rate controlled with Toprol XL.  No problems with apixaban.   4. AAA stent graft repair: Follows at [facility].  5. HTN: BP reasonably controlled on current meds.  6. Ischemic cardiomyopathy: EF 40-45% on last echo.  He does not look volume overloaded on exam and has NYHA class I-II symptoms.  Continue Toprol XL and ramipril.    Loralie Champagne 11/29/2014 .

## 2014-12-22 ENCOUNTER — Ambulatory Visit (INDEPENDENT_AMBULATORY_CARE_PROVIDER_SITE_OTHER): Payer: Medicare Other | Admitting: Family Medicine

## 2014-12-22 ENCOUNTER — Encounter: Payer: Self-pay | Admitting: Family Medicine

## 2014-12-22 VITALS — BP 140/78 | HR 82 | Temp 98.7°F | Wt 243.0 lb

## 2014-12-22 DIAGNOSIS — B356 Tinea cruris: Secondary | ICD-10-CM

## 2014-12-22 DIAGNOSIS — I2510 Atherosclerotic heart disease of native coronary artery without angina pectoris: Secondary | ICD-10-CM | POA: Diagnosis not present

## 2014-12-22 MED ORDER — CLOTRIMAZOLE 1 % EX CREA: 1.0000 "application " | TOPICAL_CREAM | Freq: Two times a day (BID) | CUTANEOUS | Status: DC

## 2014-12-22 NOTE — Patient Instructions (Signed)
Restart the cream.  I called it in to [facility].  Use until the rash is resolved and then change to talc powder.  Take care.  Glad to see you.

## 2014-12-22 NOTE — Progress Notes (Signed)
Pre visit review using our clinic review tool, if applicable. No additional management support is needed unless otherwise documented below in the visit note.  For the last few months, had jock itch.  Getting worse, scrotal irritation.  Itchy.  No fevers.  No burning with urination.  Tried clotrimazole, transient relief.  Was getting better but not resolved, then ran out of med and sx got worse again. No systemic sx.   Meds, vitals, and allergies reviewed.   ROS: See HPI.  Otherwise, noncontributory.  nad Testes bilaterally descended without nodularity, tenderness or masses. No scrotal masses or lesions but diffuse skin irritation on the B groin and scrotum, with some satellite lesions.  No fluctant mass.

## 2014-12-23 DIAGNOSIS — B356 Tinea cruris: Secondary | ICD-10-CM | POA: Insufficient documentation

## 2014-12-23 NOTE — Assessment & Plan Note (Signed)
Restart clotrimazole until sx resolved, then use for a few more days, then change to talc.  Fu prn.  He agrees.

## 2015-01-08 ENCOUNTER — Other Ambulatory Visit: Payer: Self-pay | Admitting: *Deleted

## 2015-01-08 MED ORDER — LEVOTHYROXINE SODIUM 137 MCG PO TABS: 137.0000 ug | ORAL_TABLET | Freq: Every day | ORAL | Status: DC

## 2015-01-13 ENCOUNTER — Other Ambulatory Visit (INDEPENDENT_AMBULATORY_CARE_PROVIDER_SITE_OTHER): Payer: Medicare Other | Admitting: *Deleted

## 2015-01-13 DIAGNOSIS — I10 Essential (primary) hypertension: Secondary | ICD-10-CM

## 2015-01-13 DIAGNOSIS — I2510 Atherosclerotic heart disease of native coronary artery without angina pectoris: Secondary | ICD-10-CM

## 2015-01-13 LAB — LIPID PANEL
Cholesterol: 157 mg/dL (ref 125–200)
HDL: 37 mg/dL — AB (ref >=40)
LDL Cholesterol: 85 mg/dL (ref <130)
Total CHOL/HDL Ratio: 4.2 Ratio (ref <=5.0)
Triglycerides: 173 mg/dL — ABNORMAL HIGH (ref <150)
VLDL: 35 mg/dL — ABNORMAL HIGH (ref <30)

## 2015-01-13 LAB — HEPATIC FUNCTION PANEL
ALK PHOS: 84 U/L (ref 40–115)
ALT: 13 U/L (ref 9–46)
AST: 16 U/L (ref 10–35)
Albumin: 4 g/dL (ref 3.6–5.1)
BILIRUBIN INDIRECT: 0.7 mg/dL (ref 0.2–1.2)
Bilirubin, Direct: 0.2 mg/dL (ref <=0.2)
TOTAL PROTEIN: 7.5 g/dL (ref 6.1–8.1)
Total Bilirubin: 0.9 mg/dL (ref 0.2–1.2)

## 2015-01-13 NOTE — Addendum Note (Signed)
Addended by: Eulis Foster on: 01/13/2015 08:08 AM   Modules accepted: Orders

## 2015-01-13 NOTE — Addendum Note (Signed)
Addended by: Eulis Foster on: 01/13/2015 08:09 AM   Modules accepted: Orders

## 2015-02-20 ENCOUNTER — Other Ambulatory Visit: Payer: Self-pay | Admitting: Family Medicine

## 2015-02-20 NOTE — Telephone Encounter (Signed)
Received refill faxed request from [facility]  ramipril (ALTACE) 5 MG capsule 60 capsule 6 05/01/2014    TAKE 1 CAPSULE (5 MG TOTAL) BY MOUTH 2 (TWO) TIMES DAILY.  Dispense: 60   Refill: 6  Rx have not been yet been filled by Dr Damita Dunnings. Last seen on 12/22/2014. CPE on 11/27/2015.

## 2015-02-23 MED ORDER — RAMIPRIL 5 MG PO CAPS: ORAL_CAPSULE | ORAL | Status: DC

## 2015-02-23 NOTE — Telephone Encounter (Signed)
Sent. Thanks.   

## 2015-02-24 ENCOUNTER — Other Ambulatory Visit: Payer: Self-pay | Admitting: *Deleted

## 2015-02-24 MED ORDER — RAMIPRIL 5 MG PO CAPS: ORAL_CAPSULE | ORAL | Status: DC

## 2015-04-27 DIAGNOSIS — Z96661 Presence of right artificial ankle joint: Secondary | ICD-10-CM | POA: Diagnosis not present

## 2015-04-27 DIAGNOSIS — L851 Acquired keratosis [keratoderma] palmaris et plantaris: Secondary | ICD-10-CM | POA: Diagnosis not present

## 2015-04-27 DIAGNOSIS — Z471 Aftercare following joint replacement surgery: Secondary | ICD-10-CM | POA: Diagnosis not present

## 2015-05-26 ENCOUNTER — Other Ambulatory Visit: Payer: Medicare Other

## 2015-06-05 ENCOUNTER — Other Ambulatory Visit (INDEPENDENT_AMBULATORY_CARE_PROVIDER_SITE_OTHER): Payer: Medicare Other

## 2015-06-05 DIAGNOSIS — Z125 Encounter for screening for malignant neoplasm of prostate: Secondary | ICD-10-CM

## 2015-06-05 DIAGNOSIS — E039 Hypothyroidism, unspecified: Secondary | ICD-10-CM

## 2015-06-05 LAB — TSH: TSH: 4.13 u[IU]/mL (ref 0.35–4.50)

## 2015-06-05 LAB — PSA, MEDICARE: PSA: 0.61 ng/ml (ref 0.10–4.00)

## 2015-06-18 ENCOUNTER — Other Ambulatory Visit: Payer: Self-pay | Admitting: *Deleted

## 2015-06-18 NOTE — Telephone Encounter (Signed)
Faxed refill request. Last Filled:     60 g 1 12/22/2014  Last office visit:   11/26/14 CPE  Please advise.

## 2015-06-19 MED ORDER — CLOTRIMAZOLE 1 % EX CREA: 1.0000 "application " | TOPICAL_CREAM | Freq: Two times a day (BID) | CUTANEOUS | Status: DC

## 2015-06-19 NOTE — Telephone Encounter (Signed)
Sent. Thanks.   

## 2015-07-29 ENCOUNTER — Other Ambulatory Visit: Payer: Self-pay | Admitting: Cardiology

## 2015-09-08 ENCOUNTER — Other Ambulatory Visit: Payer: Self-pay | Admitting: Family Medicine

## 2015-09-08 NOTE — Telephone Encounter (Signed)
Electronic refill request. Last Filled:    50 tablet 5 09/17/2014  Last office visit:   CPE 11/26/14  Please advise.

## 2015-09-09 NOTE — Telephone Encounter (Signed)
Sent. Thanks.   

## 2015-10-12 DIAGNOSIS — Z8551 Personal history of malignant neoplasm of bladder: Secondary | ICD-10-CM | POA: Diagnosis not present

## 2015-10-12 DIAGNOSIS — R351 Nocturia: Secondary | ICD-10-CM | POA: Diagnosis not present

## 2015-10-12 DIAGNOSIS — N401 Enlarged prostate with lower urinary tract symptoms: Secondary | ICD-10-CM | POA: Diagnosis not present

## 2015-11-20 ENCOUNTER — Ambulatory Visit: Payer: Medicare Other | Admitting: Family

## 2015-11-20 ENCOUNTER — Ambulatory Visit (HOSPITAL_COMMUNITY): Payer: Medicare Other

## 2015-11-22 ENCOUNTER — Other Ambulatory Visit: Payer: Self-pay | Admitting: Family Medicine

## 2015-11-22 DIAGNOSIS — I10 Essential (primary) hypertension: Secondary | ICD-10-CM

## 2015-11-22 DIAGNOSIS — R739 Hyperglycemia, unspecified: Secondary | ICD-10-CM

## 2015-11-22 DIAGNOSIS — E039 Hypothyroidism, unspecified: Secondary | ICD-10-CM

## 2015-11-23 ENCOUNTER — Other Ambulatory Visit (INDEPENDENT_AMBULATORY_CARE_PROVIDER_SITE_OTHER): Payer: Medicare Other

## 2015-11-23 DIAGNOSIS — I10 Essential (primary) hypertension: Secondary | ICD-10-CM | POA: Diagnosis not present

## 2015-11-23 DIAGNOSIS — E039 Hypothyroidism, unspecified: Secondary | ICD-10-CM

## 2015-11-23 DIAGNOSIS — R739 Hyperglycemia, unspecified: Secondary | ICD-10-CM | POA: Diagnosis not present

## 2015-11-23 LAB — COMPREHENSIVE METABOLIC PANEL
ALK PHOS: 79 U/L (ref 39–117)
ALT: 12 U/L (ref 0–53)
AST: 14 U/L (ref 0–37)
Albumin: 4.1 g/dL (ref 3.5–5.2)
BILIRUBIN TOTAL: 0.7 mg/dL (ref 0.2–1.2)
BUN: 18 mg/dL (ref 6–23)
CO2: 29 mEq/L (ref 19–32)
Calcium: 9.4 mg/dL (ref 8.4–10.5)
Chloride: 104 mEq/L (ref 96–112)
Creatinine, Ser: 1.05 mg/dL (ref 0.40–1.50)
GFR: 72.28 mL/min (ref >60.00)
Glucose, Bld: 100 mg/dL — ABNORMAL HIGH (ref 70–99)
Potassium: 4 mEq/L (ref 3.5–5.1)
SODIUM: 141 meq/L (ref 135–145)
Total Protein: 8.1 g/dL (ref 6.0–8.3)

## 2015-11-23 LAB — CBC WITH DIFFERENTIAL/PLATELET
BASOS PCT: 0.5 % (ref 0.0–3.0)
Basophils Absolute: 0 10*3/uL (ref 0.0–0.1)
EOS PCT: 1.7 % (ref 0.0–5.0)
Eosinophils Absolute: 0.1 10*3/uL (ref 0.0–0.7)
HCT: 47.9 % (ref 39.0–52.0)
Hemoglobin: 16.1 g/dL (ref 13.0–17.0)
LYMPHS ABS: 2.3 10*3/uL (ref 0.7–4.0)
Lymphocytes Relative: 30.5 % (ref 12.0–46.0)
MCHC: 33.6 g/dL (ref 30.0–36.0)
MCV: 90 fl (ref 78.0–100.0)
MONO ABS: 0.6 10*3/uL (ref 0.1–1.0)
Monocytes Relative: 7.9 % (ref 3.0–12.0)
NEUTROS PCT: 59.4 % (ref 43.0–77.0)
Neutro Abs: 4.5 10*3/uL (ref 1.4–7.7)
PLATELETS: 276 10*3/uL (ref 150.0–400.0)
RBC: 5.32 Mil/uL (ref 4.22–5.81)
RDW: 14 % (ref 11.5–15.5)
WBC: 7.6 10*3/uL (ref 4.0–10.5)

## 2015-11-23 LAB — LIPID PANEL
CHOL/HDL RATIO: 4
CHOLESTEROL: 142 mg/dL (ref 0–200)
HDL: 32.3 mg/dL — ABNORMAL LOW (ref >39.00)
LDL CALC: 79 mg/dL (ref 0–99)
NONHDL: 109.33
Triglycerides: 150 mg/dL — ABNORMAL HIGH (ref 0.0–149.0)
VLDL: 30 mg/dL (ref 0.0–40.0)

## 2015-11-23 LAB — TSH: TSH: 6.05 u[IU]/mL — ABNORMAL HIGH (ref 0.35–4.50)

## 2015-11-23 LAB — HEMOGLOBIN A1C: Hgb A1c MFr Bld: 5.9 % (ref 4.6–6.5)

## 2015-11-27 ENCOUNTER — Other Ambulatory Visit: Payer: Self-pay | Admitting: Cardiology

## 2015-11-27 ENCOUNTER — Encounter: Payer: Medicare Other | Admitting: Family Medicine

## 2015-11-27 DIAGNOSIS — I10 Essential (primary) hypertension: Secondary | ICD-10-CM

## 2015-11-27 DIAGNOSIS — I2510 Atherosclerotic heart disease of native coronary artery without angina pectoris: Secondary | ICD-10-CM

## 2015-11-30 ENCOUNTER — Ambulatory Visit (INDEPENDENT_AMBULATORY_CARE_PROVIDER_SITE_OTHER): Payer: Medicare Other

## 2015-11-30 ENCOUNTER — Encounter: Payer: Self-pay | Admitting: Family Medicine

## 2015-11-30 ENCOUNTER — Encounter: Payer: Medicare Other | Admitting: Family Medicine

## 2015-11-30 ENCOUNTER — Ambulatory Visit (INDEPENDENT_AMBULATORY_CARE_PROVIDER_SITE_OTHER): Payer: Medicare Other | Admitting: Family Medicine

## 2015-11-30 VITALS — BP 160/102 | HR 72 | Temp 97.7°F | Ht 70.25 in | Wt 251.2 lb

## 2015-11-30 VITALS — BP 172/102 | HR 72 | Temp 97.7°F | Ht 70.25 in | Wt 251.2 lb

## 2015-11-30 DIAGNOSIS — Z0000 Encounter for general adult medical examination without abnormal findings: Secondary | ICD-10-CM

## 2015-11-30 DIAGNOSIS — I2510 Atherosclerotic heart disease of native coronary artery without angina pectoris: Secondary | ICD-10-CM

## 2015-11-30 DIAGNOSIS — I482 Chronic atrial fibrillation: Secondary | ICD-10-CM

## 2015-11-30 DIAGNOSIS — Z23 Encounter for immunization: Secondary | ICD-10-CM | POA: Diagnosis not present

## 2015-11-30 DIAGNOSIS — I714 Abdominal aortic aneurysm, without rupture, unspecified: Secondary | ICD-10-CM

## 2015-11-30 DIAGNOSIS — E039 Hypothyroidism, unspecified: Secondary | ICD-10-CM

## 2015-11-30 DIAGNOSIS — I10 Essential (primary) hypertension: Secondary | ICD-10-CM | POA: Diagnosis not present

## 2015-11-30 DIAGNOSIS — R399 Unspecified symptoms and signs involving the genitourinary system: Secondary | ICD-10-CM

## 2015-11-30 DIAGNOSIS — E785 Hyperlipidemia, unspecified: Secondary | ICD-10-CM

## 2015-11-30 DIAGNOSIS — L84 Corns and callosities: Secondary | ICD-10-CM

## 2015-11-30 DIAGNOSIS — I4821 Permanent atrial fibrillation: Secondary | ICD-10-CM

## 2015-11-30 MED ORDER — LEVOTHYROXINE SODIUM 137 MCG PO TABS: 137.0000 ug | ORAL_TABLET | Freq: Every day | ORAL | 3 refills | Status: DC

## 2015-11-30 NOTE — Patient Instructions (Signed)
[name] , Thank you for taking time to come for your Medicare Wellness Visit. I appreciate your ongoing commitment to your health goals. Please review the following plan we discussed and let me know if I can assist you in the future.   These are the goals we discussed: Goals    . Increase physical activity          Starting 11/30/2015, I will attempt to exercise at least 30 min 3 days per week.        This is a list of the screening recommended for you and due dates:  Health Maintenance  Topic Date Due  . Tetanus Vaccine  09/26/2016  . Flu Shot  Completed  . Shingles Vaccine  Completed  . Pneumonia vaccines  Completed   Preventive Care for Adults  A healthy lifestyle and preventive care can promote health and wellness. Preventive health guidelines for adults include the following key practices.  . A routine yearly physical is a good way to check with your health care provider about your health and preventive screening. It is a chance to share any concerns and updates on your health and to receive a thorough exam.  . Visit your dentist for a routine exam and preventive care every 6 months. Brush your teeth twice a day and floss once a day. Good oral hygiene prevents tooth decay and gum disease.  . The frequency of eye exams is based on your age, health, family medical history, use  of contact lenses, and other factors. Follow your health care provider's ecommendations for frequency of eye exams.  . Eat a healthy diet. Foods like vegetables, fruits, whole grains, low-fat dairy products, and lean protein foods contain the nutrients you need without too many calories. Decrease your intake of foods high in solid fats, added sugars, and salt. Eat the right amount of calories for you. Get information about a proper diet from your health care provider, if necessary.  . Regular physical exercise is one of the most important things you can do for your health. Most adults should get at least  150 minutes of moderate-intensity exercise (any activity that increases your heart rate and causes you to sweat) each week. In addition, most adults need muscle-strengthening exercises on 2 or more days a week.  Silver Sneakers may be a benefit available to you. To determine eligibility, you may visit the website: www.silversneakers.com or contact program at (409)123-0575 Mon-Fri between 8AM-8PM.   . Maintain a healthy weight. The body mass index (BMI) is a screening tool to identify possible weight problems. It provides an estimate of body fat based on height and weight. Your health care provider can find your BMI and can help you achieve or maintain a healthy weight.   For adults 20 years and older: ? A BMI below 18.5 is considered underweight. ? A BMI of 18.5 to 24.9 is normal. ? A BMI of 25 to 29.9 is considered overweight. ? A BMI of 30 and above is considered obese.   . Maintain normal blood lipids and cholesterol levels by exercising and minimizing your intake of saturated fat. Eat a balanced diet with plenty of fruit and vegetables. Blood tests for lipids and cholesterol should begin at age 27 and be repeated every 5 years. If your lipid or cholesterol levels are high, you are over 50, or you are at high risk for heart disease, you may need your cholesterol levels checked more frequently. Ongoing high lipid and cholesterol levels should  be treated with medicines if diet and exercise are not working.  . If you smoke, find out from your health care provider how to quit. If you do not use tobacco, please do not start.  . If you choose to drink alcohol, please do not consume more than 2 drinks per day. One drink is considered to be 12 ounces (355 mL) of beer, 5 ounces (148 mL) of wine, or 1.5 ounces (44 mL) of liquor.  . If you are 28-68 years old, ask your health care provider if you should take aspirin to prevent strokes.  . Use sunscreen. Apply sunscreen liberally and repeatedly  throughout the day. You should seek shade when your shadow is shorter than you. Protect yourself by wearing long sleeves, pants, a wide-brimmed hat, and sunglasses year round, whenever you are outdoors.  . Once a month, do a whole body skin exam, using a mirror to look at the skin on your back. Tell your health care provider of new moles, moles that have irregular borders, moles that are larger than a pencil eraser, or moles that have changed in shape or color.

## 2015-11-30 NOTE — Patient Instructions (Addendum)
Recheck thyroid test in about 6 months.   Keep filing the callous down and update me as needed.  Take care.  Glad to see you.  Update me about your BP in a few days if not controlled.

## 2015-11-30 NOTE — Progress Notes (Signed)
Hypertension:    Using medication without problems or lightheadedness: yes Chest pain with exertion:no Edema:no Short of breath:no BP elevation.  He thought he missed his BP medicine recently.  D/w pt.  He is going to check BP out of clinic after taking his medicine and update me as needed.  He has h/o elevated BP at clinic, even with taking his medicine.   D/w pt about diet and exercise.    Colon cancer screening deferred given his age, d/w pt.    Advance directive- daughter and son designated if patient were incapacitated.    R foot complaint.  Pain with walking.  Callous noted by patient.   Urination d/w pt.  Last few nights with slower stream.  Weaker stream.  Drinking tea makes it worse, dw pt about caffeine.  Does better drinking more water.  Had seen uro prev about h/o bladder cancer, about 1 month ago.    CAD. No CP SOB BLE edema.    AAA s/p repair.  Has routine f/u with vascular clinic.    Elevated Cholesterol: Using medications without problems:yes Muscle aches: not from statin, only occ mild aches.   Diet compliance:yes Exercise:yes  Hypothyroidism.  No neck mass, no lumps, no dysphagia. Compliant with med. TSH minimally up, d/w pt about checking TSH in about 6 months.    Meds, vitals, and allergies reviewed.   PMH and SH reviewed  ROS: Per HPI unless specifically indicated in ROS section   GEN: nad, alert and oriented HEENT: mucous membranes moist NECK: supple w/o LA, no tmg CV: IRR PULM: ctab, no inc wob ABD: soft, +bs EXT: no edema SKIN: no acute rash  Callous noted on right foot near the distal first metatarsal. No ulceration. No skin breakdown. Callous is about 2 cm across.

## 2015-11-30 NOTE — Progress Notes (Signed)
Subjective:   [name] is a 82 y.o. male who presents for Medicare Annual/Subsequent preventive examination.  Review of Systems:  N/A Cardiac Risk Factors include: advanced age (>60men, >52 women);dyslipidemia;obesity (BMI >30kg/m2);male gender;hypertension     Objective:    Vitals: BP (!) 160/102 (BP Location: Left Arm, Patient Position: Sitting, Cuff Size: Normal) Comment: pt does not recall taking evening BP medication  Pulse 72   Temp 97.7 F (36.5 C) (Oral)   Ht 5' 10.25" (1.784 m)   Wt 251 lb 4 oz (114 kg)   SpO2 98%   BMI 35.79 kg/m   Body mass index is 35.79 kg/m.  Tobacco History  Smoking Status  . Former Smoker  . Packs/day: 1.25  . Years: 30.00  . Types: Cigarettes  . Quit date: 02/28/1990  Smokeless Tobacco  . Never Used     Counseling given: No   Past Medical History:  Diagnosis Date  . AAA (abdominal aortic aneurysm) ([place])    s/p stent graft repair 2012  . Arthritis    "right ankle" (04/17/2014)  . Atrial fibrillation ([place])    a. permanent;  b. Hematuria on Pradaxa;  c. cold intol and chills with Xarelto  . Bladder cancer ([place])   . Cardiomyopathy [facility])    a. Echo (03/2013): Inferior and inferolateral AK, EF 40-45%, mild MR, mod LAE, mod RAE, mod TR, mild PI, PASP 46  . Chronic kidney disease   . COPD (chronic obstructive pulmonary disease) ([place])   . Coronary artery disease s/p cabg x7  1992   a. s/p CABG in 1992 (X 7 - Dr. Redmond Pulling);  b.  Myoview (01/2011): Fixed inferior defect suggestive of prior MI versus diaphragmatic attenuation, apical reversibility possibly suspicious for small area of infarction with peri-infarct ischemia, no significant change from prior study, not gated, low risk study;  c.  Echo (05/2010): Mod LVH, EF 55-65%, mild AI, mild MR, moderate LAE, mild RAE, moderate   . Dysrhythmia   . Hematuria bladder tumor ---  followed by Dr Karsten Ro  . Hiatal hernia    repaired in 1992 w/OHS  . Hx of cardiovascular stress test    Lexiscan Myoview (03/2013):  No ischemia; not gated.  . Hyperglycemia diet controlled  . Hyperlipidemia   . Hypertension   . Hypothyroidism   . Obesity   . Organic impotence   . S/P AAA repair using bifurcation graft   . Sleep apnea no rx cpap   mild- tested  2012  . Ventricular ectopy    mild   Past Surgical History:  Procedure Laterality Date  . ABDOMINAL AORTIC ANEURYSM REPAIR  2012   using bifurcation graft  . CARDIAC CATHETERIZATION  03-1990   Positive  . CARDIOVASCULAR STRESS TEST  02-01-2011  NUCLEAR NO EXERCISE   LOW RISK STUDY.  SMALL APICAL DEFECT. NO EVIDENCE OF MULTIZONE ISCHEMIA.  Marland Kitchen CORONARY ARTERY BYPASS GRAFT  05/1990   CABG X 7  . ENDOVASCULAR STENT INSERTION  02/15/2011   Procedure: ENDOVASCULAR STENT GRAFT INSERTION;  Surgeon: Hinda Lenis, MD;  Location: [place];  Service: Vascular;  Laterality: N/A;  Insertion of endovascular stent graft   . ETT  10/31/00   WNL  . HIATAL HERNIA REPAIR  1992   "repaired when I had my OHS"  . Hospital - Afib  03/28/02  . INGUINAL HERNIA REPAIR Left 1997  . JOINT REPLACEMENT Right 04-17-14   Ankle  . TOTAL ANKLE ARTHROPLASTY Right 04/17/2014   Procedure: RIGHT TOTAL  ANKLE ARTHOPLASTY;  Surgeon: Wylene Simmer, MD;  Location: [facility];  Service: Orthopedics;  Laterality: Right;  . TOTAL ANKLE REPLACEMENT Right 04/17/2014  . TRANSURETHRAL RESECTION OF BLADDER TUMOR  03/25/2011   Procedure: TRANSURETHRAL RESECTION OF BLADDER TUMOR (TURBT);  Surgeon: Claybon Jabs, MD;  [facility];  Service: Urology;  Laterality: N/A;   Family History  Problem Relation Age of Onset  . Obesity Mother 8    deceased  . Hypertension Mother   . Diabetes Mother   . Heart disease Mother     before age 65  . Hyperlipidemia Mother   . Alcohol abuse Sister 98    deceased  . Other Father     deceased unknown  . Colon cancer Neg Hx   . Prostate cancer Neg Hx   . Anesthesia problems Neg Hx    History  Sexual Activity  .  Sexual activity: Yes    Outpatient Encounter Prescriptions as of 11/30/2015  Medication Sig  . atorvastatin (LIPITOR) 80 MG tablet TAKE 1 TABLET BY MOUTH DAILY  . clotrimazole (LOTRIMIN) 1 % cream Apply 1 application topically 2 (two) times daily.  Marland Kitchen ELIQUIS 5 MG TABS tablet TAKE 1 TABLET BY MOUTH TWICE A DAY  . levothyroxine (SYNTHROID, LEVOTHROID) 137 MCG tablet Take 1 tablet (137 mcg total) by mouth daily before breakfast.  . metoprolol succinate (TOPROL-XL) 100 MG 24 hr tablet TAKE 1 TABLET (100 MG TOTAL) BY MOUTH DAILY. TAKE WITH OR IMMEDIATELY FOLLOWING A MEAL.  . ramipril (ALTACE) 5 MG capsule TAKE 1 CAPSULE (5 MG TOTAL) BY MOUTH 2 (TWO) TIMES DAILY.  . sildenafil (REVATIO) 20 MG tablet TAKE 3 TO 5 TABLETS BY MOUTH DAILY AS NEEDED   No facility-administered encounter medications on file as of 11/30/2015.     Activities of Daily Living In your present state of health, do you have any difficulty performing the following activities: 11/30/2015  Hearing? N  Vision? N  Difficulty concentrating or making decisions? Y  Walking or climbing stairs? N  Dressing or bathing? N  Doing errands, shopping? N  Preparing Food and eating ? N  Using the Toilet? N  In the past six months, have you accidently leaked urine? N  Do you have problems with loss of bowel control? N  Managing your Medications? N  Managing your Finances? N  Housekeeping or managing your Housekeeping? N  Some recent data might be hidden    Patient Care Team: Tonia Ghent, MD as PCP - General (Family Medicine) Kathie Rhodes, MD as Attending Physician (Urology) Larey Dresser, MD as Consulting Physician (Cardiology)   Assessment:     Hearing Screening   125Hz  250Hz  500Hz  1000Hz  2000Hz  3000Hz  4000Hz  6000Hz  8000Hz   Right ear:   40 0 40  0    Left ear:   40 0 40  0      Visual Acuity Screening   Right eye Left eye Both eyes  Without correction:     With correction: 20/40 20/30-1 20/25    Exercise Activities and  Dietary recommendations Current Exercise Habits: Home exercise routine, Type of exercise: Other - see comments;strength training/weights;treadmill (bicycle riding when at [place] home, golf), Time (Minutes): 30, Frequency (Times/Week): 3, Weekly Exercise (Minutes/Week): 90, Intensity: Moderate, Exercise limited by: None identified  Goals    . Increase physical activity          Starting 11/30/2015, I will attempt to exercise at least 30 min 3 days per week.  Fall Risk Fall Risk  11/30/2015 11/26/2014  Falls in the past year? No No   Depression Screen PHQ 2/9 Scores 11/30/2015 11/26/2014  PHQ - 2 Score 0 0    Cognitive Testing MMSE - Mini Mental State Exam 11/30/2015  Orientation to time 5  Orientation to Place 5  Registration 3  Attention/ Calculation 0  Recall 2  Recall-comments pt was unable to recall 1 of 3 words  Language- name 2 objects 0  Language- repeat 1  Language- follow 3 step command 3  Language- read & follow direction 0  Write a sentence 0  Copy design 0  Total score 19   PLEASE NOTE: A Mini-Cog screen was completed. Maximum score is 20. A value of 0 denotes this part of [place] MMSE was not completed or the patient failed this part of the Mini-Cog screening.   Mini-Cog Screening Orientation to Time - Max 5 pts Orientation to Place - Max 5 pts Registration - Max 3 pts Recall - Max 3 pts Language Repeat - Max 1 pts Language Follow 3 Step Command - Max 3 pts  Immunization History  Administered Date(s) Administered  . Influenza Split 12/28/2010, 12/29/2011  . Influenza Whole 01/09/2006, 12/18/2008, 10/27/2009  . Influenza,inj,Quad PF,36+ Mos 12/11/2012, 11/22/2013, 11/26/2014, 11/30/2015  . Pneumococcal Conjugate-13 11/26/2014  . Pneumococcal Polysaccharide-23 03/01/1999, 12/28/2010  . Td 11/28/1996, 09/27/2006  . Zoster 04/29/2010   Screening Tests Health Maintenance  Topic Date Due  . TETANUS/TDAP  09/26/2016  . INFLUENZA VACCINE  Completed  .  ZOSTAVAX  Completed  . PNA vac Low Risk Adult  Completed      Plan:     I have personally reviewed and addressed the Medicare Annual Wellness questionnaire and have noted the following in the patient's chart:  A. Medical and social history B. Use of alcohol, tobacco or illicit drugs  C. Current medications and supplements D. Functional ability and status E.  Nutritional status F.  Physical activity G. Advance directives H. List of other physicians I.  Hospitalizations, surgeries, and ER visits in previous 12 months J.  [place] to include hearing, vision, cognitive, depression L. Referrals and appointments - none  In addition, I have reviewed and discussed with patient certain preventive protocols, quality metrics, and best practice recommendations. A written personalized care plan for preventive services as well as general preventive health recommendations were provided to patient.  See attached scanned questionnaire for additional information.   Signed,   Lindell Noe, MHA, BS, LPN Health Advisor

## 2015-11-30 NOTE — Progress Notes (Signed)
PCP notes:   Health maintenance:  Flu vaccine - administered  Abnormal screenings:   Hearing - failed Mini-Cog score: 19/20  Patient concerns:   Pt is having concerns with a callous portion on bottom of right foot. In addition, pt verbalized difficulty initiating urination.   Nurse concerns:  Pt's BP was elevated at 160/100. Waited 10 min and reassessed pt's BP. BP was 160/102. BP assessed in left arm only. Pt stated he does not recall taking evening BP medication.  Next PCP appt:   11/30/15 @ 1215  I reviewed health advisor's note, was available for consultation on the day of service listed in this note, and agree with documentation and plan. Elsie Stain, MD.

## 2015-11-30 NOTE — Progress Notes (Signed)
Pre visit review using our clinic review tool, if applicable. No additional management support is needed unless otherwise documented below in the visit note. 

## 2015-12-01 DIAGNOSIS — L84 Corns and callosities: Secondary | ICD-10-CM | POA: Insufficient documentation

## 2015-12-01 DIAGNOSIS — R399 Unspecified symptoms and signs involving the genitourinary system: Secondary | ICD-10-CM | POA: Insufficient documentation

## 2015-12-01 NOTE — Assessment & Plan Note (Addendum)
Reasonable control of his lipids. LDL near goal. He's going to work on diet and exercise. No change in meds. >25 minutes spent in face to face time with patient, >50% spent in counselling or coordination of care.

## 2015-12-01 NOTE — Assessment & Plan Note (Signed)
Per vascular clinic.  

## 2015-12-01 NOTE — Assessment & Plan Note (Signed)
No ADE on meds.  Not tachy.

## 2015-12-01 NOTE — Assessment & Plan Note (Signed)
Worse with caffeine intake. Discussed with patient. He has had routine urology follow-up. No intervention other than avoiding caffeine. He agrees.

## 2015-12-01 NOTE — Assessment & Plan Note (Signed)
Minimal increase in TSH. No thyromegaly. No symptoms attributable to hypothyroidism. We agreed to recheck TSH in about 6 months. We can address if continually elevated. He agrees.

## 2015-12-01 NOTE — Assessment & Plan Note (Signed)
He is going to check on his blood pressure at home. He may have missed a dose of his medication. He will let me know if his blood pressure is not controlled.

## 2015-12-01 NOTE — Assessment & Plan Note (Signed)
No ulceration. Discussed with patient about options. I shaved this down sharply with a scalpel after cleaning the area with alcohol. No complications. This debulked the callus. He felt better walking on it. There was no bleeding and no excision of non-callused tissue. Tolerated well. No complications. He'll continue to gently file down the callus at home.

## 2015-12-06 ENCOUNTER — Telehealth: Payer: Self-pay | Admitting: Family Medicine

## 2015-12-06 NOTE — Telephone Encounter (Addendum)
Call pt.  Make sure BP is controlled on home check after recent OV. Was up at [facility].  thanks.

## 2015-12-07 NOTE — Telephone Encounter (Signed)
Left detailed message on voicemail to return call. 

## 2015-12-07 NOTE — Telephone Encounter (Signed)
Patient came by with some BP readings, placed in your In Box.  Patient also says the callus on his foot is doing great.  He says he's walking better than he has in 3-4 years.

## 2015-12-08 ENCOUNTER — Encounter: Payer: Self-pay | Admitting: Family

## 2015-12-08 DIAGNOSIS — H43813 Vitreous degeneration, bilateral: Secondary | ICD-10-CM | POA: Diagnosis not present

## 2015-12-08 DIAGNOSIS — H2513 Age-related nuclear cataract, bilateral: Secondary | ICD-10-CM | POA: Diagnosis not present

## 2015-12-08 DIAGNOSIS — H02831 Dermatochalasis of right upper eyelid: Secondary | ICD-10-CM | POA: Diagnosis not present

## 2015-12-08 DIAGNOSIS — H02834 Dermatochalasis of left upper eyelid: Secondary | ICD-10-CM | POA: Diagnosis not present

## 2015-12-08 NOTE — Telephone Encounter (Signed)
Patient advised.

## 2015-12-08 NOTE — Telephone Encounter (Signed)
Those pressures look a lot better.  Please tell him I would continue as is. Thanks.  App update from patient.

## 2015-12-11 ENCOUNTER — Ambulatory Visit (INDEPENDENT_AMBULATORY_CARE_PROVIDER_SITE_OTHER): Payer: Medicare Other | Admitting: Family

## 2015-12-11 ENCOUNTER — Encounter: Payer: Self-pay | Admitting: Family

## 2015-12-11 ENCOUNTER — Ambulatory Visit (HOSPITAL_COMMUNITY)
Admission: RE | Admit: 2015-12-11 | Discharge: 2015-12-11 | Disposition: A | Discharge status: Home / Self Care | Payer: Medicare Other | Source: Ambulatory Visit | Attending: Family | Admitting: Family

## 2015-12-11 VITALS — BP 168/97 | HR 71 | Temp 97.5°F | Resp 20 | Ht 70.5 in | Wt 250.1 lb

## 2015-12-11 DIAGNOSIS — Z87891 Personal history of nicotine dependence: Secondary | ICD-10-CM

## 2015-12-11 DIAGNOSIS — Z95828 Presence of other vascular implants and grafts: Secondary | ICD-10-CM

## 2015-12-11 DIAGNOSIS — I714 Abdominal aortic aneurysm, without rupture, unspecified: Secondary | ICD-10-CM

## 2015-12-11 DIAGNOSIS — I2510 Atherosclerotic heart disease of native coronary artery without angina pectoris: Secondary | ICD-10-CM | POA: Diagnosis not present

## 2015-12-11 NOTE — Progress Notes (Signed)
[facility] [place]  CC: Follow up s/p EVAR  History of Present Illness  [name] is a 82 y.o. (12/06/1933) male patient of Dr. Bridgett Larsson who presents for routine follow up s/p EVAR (Date: 02/15/11). EVAR duplex (Date: 11/04/11) demonstrates: no endoleak and stable sac size. Most recent CTA (Date: 05/03/2013) demonstrates: no endoleak and stable decrease sac size. The patient has not had back or abdominal pain.  Pt denies any history of stroke or TIA, denies claudication symptoms with walking.  He had an ankle replacement 04/17/14 due to arthritis, Dr. Wylene Simmer, [facility].  Pt states his blood pressure increases in a medical office, usually runs about 124-135/60's.  Pt denies any kidney problems.  He takes Eliquis for atrial fib. He takes a statin.  Pt Diabetic: No Pt smoker: former smoker, quit in 1992    Past Medical History:  Diagnosis Date  . AAA (abdominal aortic aneurysm) ([place])    s/p stent graft repair 2012  . Arthritis    "right ankle" (04/17/2014)  . Atrial fibrillation ([place])    a. permanent;  b. Hematuria on Pradaxa;  c. cold intol and chills with Xarelto  . Bladder cancer ([place])   . Cardiomyopathy [facility])    a. Echo (03/2013): Inferior and inferolateral AK, EF 40-45%, mild MR, mod LAE, mod RAE, mod TR, mild PI, PASP 46  . Chronic kidney disease   . COPD (chronic obstructive pulmonary disease) ([place])   . Coronary artery disease s/p cabg x7  1992   a. s/p CABG in 1992 (X 7 - Dr. Redmond Pulling);  b.  Myoview (01/2011): Fixed inferior defect suggestive of prior MI versus diaphragmatic attenuation, apical reversibility possibly suspicious for small area of infarction with peri-infarct ischemia, no significant change from prior study, not gated, low risk study;  c.  Echo (05/2010): Mod LVH, EF 55-65%, mild AI, mild MR, moderate LAE, mild RAE, moderate   . Dysrhythmia   . Hematuria bladder tumor ---  followed by Dr Karsten Ro  . Hiatal hernia     repaired in 1992 w/[facility]  . Hx of cardiovascular stress test    Lexiscan Myoview (03/2013):  No ischemia; not gated.  . Hyperglycemia diet controlled  . Hyperlipidemia   . Hypertension   . Hypothyroidism   . Obesity   . Organic impotence   . S/P AAA repair using bifurcation graft   . Sleep apnea no rx cpap   mild- tested  2012  . Ventricular ectopy    mild   Past Surgical History:  Procedure Laterality Date  . ABDOMINAL AORTIC ANEURYSM REPAIR  2012   using bifurcation graft  . CARDIAC CATHETERIZATION  03-1990   Positive  . CARDIOVASCULAR STRESS TEST  02-01-2011  NUCLEAR NO EXERCISE   LOW RISK STUDY.  SMALL APICAL DEFECT. NO EVIDENCE OF MULTIZONE ISCHEMIA.  Marland Kitchen CORONARY ARTERY BYPASS GRAFT  05/1990   CABG X 7  . ENDOVASCULAR STENT INSERTION  02/15/2011   Procedure: ENDOVASCULAR STENT GRAFT INSERTION;  Surgeon: Hinda Lenis, MD;  Location: [place];  Service: Vascular;  Laterality: N/A;  Insertion of endovascular stent graft   . ETT  10/31/00   WNL  . HIATAL HERNIA REPAIR  1992   "repaired when I had my O[facility]"  . Hospital - Afib  03/28/02  . INGUINAL HERNIA REPAIR Left 1997  . JOINT REPLACEMENT Right 04-17-14   Ankle  . TOTAL ANKLE ARTHROPLASTY Right 04/17/2014   Procedure: RIGHT TOTAL ANKLE ARTHOPLASTY;  Surgeon: Jenny Reichmann  Doran Durand, MD;  Location: [place];  Service: Orthopedics;  Laterality: Right;  . TOTAL ANKLE REPLACEMENT Right 04/17/2014  . TRANSURETHRAL RESECTION OF BLADDER TUMOR  03/25/2011   Procedure: TRANSURETHRAL RESECTION OF BLADDER TUMOR (TURBT);  Surgeon: Claybon Jabs, MD;  Location: [facility];  Service: Urology;  Laterality: N/A;   Social History Social History  Substance Use Topics  . Smoking status: Former Smoker    Packs/day: 1.25    Years: 30.00    Types: Cigarettes    Quit date: 02/28/1990  . Smokeless tobacco: Never Used  . Alcohol use 0.0 oz/week     Comment: 04/17/2014 "might drink a 6 pack of beer/year"   Family History Family History   Problem Relation Age of Onset  . Obesity Mother 75    deceased  . Hypertension Mother   . Diabetes Mother   . Heart disease Mother     before age 105  . Hyperlipidemia Mother   . Alcohol abuse Sister 55    deceased  . Other Father     deceased unknown  . Colon cancer Neg Hx   . Prostate cancer Neg Hx   . Anesthesia problems Neg Hx    Current Outpatient Prescriptions on File Prior to Visit  Medication Sig Dispense Refill  . atorvastatin (LIPITOR) 80 MG tablet TAKE 1 TABLET BY MOUTH DAILY 30 tablet 0  . clotrimazole (LOTRIMIN) 1 % cream Apply 1 application topically 2 (two) times daily. 60 g 1  . ELIQUIS 5 MG TABS tablet TAKE 1 TABLET BY MOUTH TWICE A DAY 60 tablet 3  . levothyroxine (SYNTHROID, LEVOTHROID) 137 MCG tablet Take 1 tablet (137 mcg total) by mouth daily before breakfast. 90 tablet 3  . metoprolol succinate (TOPROL-XL) 100 MG 24 hr tablet TAKE 1 TABLET (100 MG TOTAL) BY MOUTH DAILY. TAKE WITH OR IMMEDIATELY FOLLOWING A MEAL. 90 tablet 3  . ramipril (ALTACE) 5 MG capsule TAKE 1 CAPSULE (5 MG TOTAL) BY MOUTH 2 (TWO) TIMES DAILY. 180 capsule 3  . sildenafil (REVATIO) 20 MG tablet TAKE 3 TO 5 TABLETS BY MOUTH DAILY AS NEEDED 50 tablet 5   No current facility-administered medications on file prior to visit.    Allergies  Allergen Reactions  . Xarelto [Rivaroxaban] Shortness Of Breath and Other (See Comments)    SOB, CHILLS  . Dabigatran Etexilate Mesylate     Blood in urine     ROS: See HPI for pertinent positives and negatives.  Physical Examination  Vitals:   12/11/15 0856 12/11/15 0901  BP: (!) 166/101 (!) 168/97  Pulse: 71   Resp: 20   Temp: 97.5 F (36.4 C)   TempSrc: Oral   SpO2: 98%   Weight: 250 lb 1.6 oz (113.4 kg)   Height: 5' 10.5" (1.791 m)    Body mass index is 35.38 kg/m.  General: A&O x 3, WD, obese male   Pulmonary: Sym exp, good air movt, CTAB, no rales, rhonchi, & wheezing  Cardiac: RRR, Nl S1, S2, no detected  murmur  Vascular: Vessel Right Left  Aorta Not palpable N/A  Femoral Palpable Palpable  Popliteal Not palpable Not palpable  PT Not Palpable Not Palpable  DP Not Palpable Not Palpable   Gastrointestinal: soft, NTND, -G/R, - HSM, - palpable masses, - CVAT B  Musculoskeletal: M/S 5/5 throughout , Extremities without ischemic changes. Healing incision anterior right ankle, right ankle with 1-2+ pitting and non pitting edema.  Neurologic: Pain and light touch intact in extremities ,  Motor exam as listed above   CTA Abd/Pelvis Duplex (Date: 05/03/2013 )  Stable patency and positioning of aortic endograft with no evidence of endoleak following EVAR. Thrombosed aneurysm sac shows decrease in size since prior CTA with maximal diameter of 6.4 cm.   No endoleak is identified by CTA.  Non-Invasive Vascular Imaging  EVAR Duplex (Date: 12/11/15)  AAA sac size: 4.45 cm x 4.63 cm; 05/16/14: 4.63 cm x 4.83 cm  no endoleak detected   Medical Decision Making  [name] is a 82 y.o. male who presents s/p EVAR (Date: 02/15/11)).  Pt is asymptomatic with a slight decrease in sac size, no endo leak.    I discussed with the patient the importance of surveillance of the endograft.   The next and final CTA will be scheduled for 12 months, as the 5 (6) year post surgical CT evaluation. He had a CTA in 2015.   The patient will follow up with [place] in 12 months with these studies.  I emphasized the importance of maximal medical management including strict control of blood pressure, blood glucose, and lipid levels, antiplatelet agents, obtaining regular exercise, and cessation of smoking.   Thank you for allowing [place] to participate in this patient's care.  Clemon Chambers, RN, MSN, FNP-C [facility] [place] Office: [phone]  Clinic Physician: Donzetta Matters  12/11/2015, 9:12 AM

## 2016-01-13 ENCOUNTER — Other Ambulatory Visit: Payer: Self-pay | Admitting: *Deleted

## 2016-01-13 DIAGNOSIS — I714 Abdominal aortic aneurysm, without rupture, unspecified: Secondary | ICD-10-CM

## 2016-02-12 ENCOUNTER — Other Ambulatory Visit: Payer: Self-pay | Admitting: Cardiology

## 2016-02-12 DIAGNOSIS — I2510 Atherosclerotic heart disease of native coronary artery without angina pectoris: Secondary | ICD-10-CM

## 2016-02-12 DIAGNOSIS — I10 Essential (primary) hypertension: Secondary | ICD-10-CM

## 2016-03-15 ENCOUNTER — Ambulatory Visit (INDEPENDENT_AMBULATORY_CARE_PROVIDER_SITE_OTHER): Payer: Medicare Other | Admitting: Physician Assistant

## 2016-03-15 ENCOUNTER — Encounter: Payer: Self-pay | Admitting: Physician Assistant

## 2016-03-15 ENCOUNTER — Encounter (INDEPENDENT_AMBULATORY_CARE_PROVIDER_SITE_OTHER): Payer: Self-pay

## 2016-03-15 VITALS — BP 142/90 | HR 82 | Ht 70.5 in | Wt 254.4 lb

## 2016-03-15 DIAGNOSIS — Z9889 Other specified postprocedural states: Secondary | ICD-10-CM

## 2016-03-15 DIAGNOSIS — I2510 Atherosclerotic heart disease of native coronary artery without angina pectoris: Secondary | ICD-10-CM

## 2016-03-15 DIAGNOSIS — I10 Essential (primary) hypertension: Secondary | ICD-10-CM | POA: Diagnosis not present

## 2016-03-15 DIAGNOSIS — I482 Chronic atrial fibrillation: Secondary | ICD-10-CM

## 2016-03-15 DIAGNOSIS — Z8679 Personal history of other diseases of the circulatory system: Secondary | ICD-10-CM

## 2016-03-15 DIAGNOSIS — E7800 Pure hypercholesterolemia, unspecified: Secondary | ICD-10-CM

## 2016-03-15 DIAGNOSIS — I5022 Chronic systolic (congestive) heart failure: Secondary | ICD-10-CM | POA: Diagnosis not present

## 2016-03-15 DIAGNOSIS — I4821 Permanent atrial fibrillation: Secondary | ICD-10-CM

## 2016-03-15 MED ORDER — METOPROLOL SUCCINATE ER 50 MG PO TB24: 25.0000 mg | ORAL_TABLET | Freq: Every day | ORAL | Status: DC

## 2016-03-15 MED ORDER — ATORVASTATIN CALCIUM 40 MG PO TABS: 40.0000 mg | ORAL_TABLET | Freq: Every day | ORAL | 3 refills | Status: DC

## 2016-03-15 MED ORDER — APIXABAN 5 MG PO TABS: 5.0000 mg | ORAL_TABLET | Freq: Two times a day (BID) | ORAL | 3 refills | Status: DC

## 2016-03-15 MED ORDER — METOPROLOL SUCCINATE ER 100 MG PO TB24: 50.0000 mg | ORAL_TABLET | Freq: Every day | ORAL | 3 refills | Status: DC

## 2016-03-15 NOTE — Progress Notes (Signed)
Cardiology Office Note:    Date:  03/15/2016   ID:  [name], DOB [birth year], MRN [id number]  PCP:  Elsie Stain, MD  Cardiologist:  Dr. Loralie Champagne   Electrophysiologist:  n/a VVS: Dr. Donzetta Matters  Referring MD: Tonia Ghent, MD   Chief Complaint  Patient presents with  . Follow-up    CAD, AFib    History of Present Illness:    [name] is a 83 y.o. male with a hx of CAD s/p CABG in 1992 (Dr. Redmond Pulling), permanent AF, AAA s/p EVAR, CKD.  He has been on Apixaban for anticoagulation.  Last seen by Dr. Loralie Champagne in 9/16.    He returns for Cardiology follow up.  He is here alone.  Since last seen, he DC'd Metoprolol Succinate b/c of diarrhea.  This resolved when he stopped it.  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  He denies chest pain, shortness of breath, syncope, orthopnea, PND, edema.  He denies melena, hematochezia.    Prior CV studies that were reviewed today include:    Echo (03/2013):  Inferior and inferolateral AK, EF 40-45%, mild MR, mod LAE, mod RAE, mod TR, mild PI, PASP 46  Lexiscan Myoview (03/2013):   No ischemia; not gated.  Myoview (01/2011):  Fixed inferior defect suggestive of prior MI versus diaphragmatic attenuation, apical reversibility possibly suspicious for small area of infarction with peri-infarct ischemia, no significant change from prior study, not gated, low risk study.    Echo (05/2010):  Moderate LVH, EF 55-65%, mild AI, mild MR, moderate LAE, mild RAE, moderate to severe TR, PASP 45.  Past Medical History:  Diagnosis Date  . AAA (abdominal aortic aneurysm) ([place])    s/p stent graft repair 2012  . Arthritis    "right ankle" (04/17/2014)  . Atrial fibrillation ([place])    a. permanent;  b. Hematuria on Pradaxa;  c. cold intol and chills with Xarelto  . Bladder cancer ([place])   . Cardiomyopathy [facility])    a. Echo  (03/2013): Inferior and inferolateral AK, EF 40-45%, mild MR, mod LAE, mod RAE, mod TR, mild PI, PASP 46  . Chronic kidney disease   . COPD (chronic obstructive pulmonary disease) ([place])   . Coronary artery disease s/p cabg x7  1992   a. s/p CABG in 1992 (X 7 - Dr. Redmond Pulling);  b.  Myoview (01/2011): Fixed inferior defect suggestive of prior MI versus diaphragmatic attenuation, apical reversibility possibly suspicious for small area of infarction with peri-infarct ischemia, no significant change from prior study, not gated, low risk study;  c.  Echo (05/2010): Mod LVH, EF 55-65%, mild AI, mild MR, moderate LAE, mild RAE, moderate   . Dysrhythmia   . Hematuria bladder tumor ---  followed by Dr Karsten Ro  . Hiatal hernia    repaired in 1992 w/[facility]  . Hx of cardiovascular stress test    Lexiscan Myoview (03/2013):  No ischemia; not gated.  . Hyperglycemia diet controlled  . Hyperlipidemia   . Hypertension   . Hypothyroidism   . Obesity   . Organic impotence   . S/P AAA repair using bifurcation graft   . Sleep apnea no rx cpap   mild- tested  2012  . Ventricular ectopy    mild    Past Surgical History:  Procedure Laterality Date  . ABDOMINAL AORTIC ANEURYSM REPAIR  2012   using bifurcation graft  . CARDIAC CATHETERIZATION  03-1990   Positive  . CARDIOVASCULAR STRESS TEST  02-01-2011  NUCLEAR NO EXERCISE   LOW RISK STUDY.  SMALL APICAL DEFECT. NO EVIDENCE OF MULTIZONE ISCHEMIA.  Marland Kitchen CORONARY ARTERY BYPASS GRAFT  05/1990   CABG X 7  . ENDOVASCULAR STENT INSERTION  02/15/2011   Procedure: ENDOVASCULAR STENT GRAFT INSERTION;  Surgeon: Hinda Lenis, MD;  Location: [place];  Service: Vascular;  Laterality: N/A;  Insertion of endovascular stent graft   . ETT  10/31/00   WNL  . HIATAL HERNIA REPAIR  1992   "repaired when I had my OHS"  . Hospital - Afib  03/28/02  . INGUINAL HERNIA REPAIR Left 1997  . JOINT REPLACEMENT Right 04-17-14   Ankle  . TOTAL ANKLE ARTHROPLASTY Right 04/17/2014    Procedure: RIGHT TOTAL ANKLE ARTHOPLASTY;  Surgeon: Wylene Simmer, MD;  Location: [place];  Service: Orthopedics;  Laterality: Right;  . TOTAL ANKLE REPLACEMENT Right 04/17/2014  . TRANSURETHRAL RESECTION OF BLADDER TUMOR  03/25/2011   Procedure: TRANSURETHRAL RESECTION OF BLADDER TUMOR (TURBT);  Surgeon: Claybon Jabs, MD;  Location: [facility];  Service: Urology;  Laterality: N/A;    Current Medications: Current Meds  Medication Sig  . clotrimazole (LOTRIMIN) 1 % cream Apply 1 application topically as needed (SKIN IRRITATION).  Marland Kitchen levothyroxine (SYNTHROID, LEVOTHROID) 137 MCG tablet Take 1 tablet (137 mcg total) by mouth daily before breakfast.  . ramipril (ALTACE) 5 MG capsule TAKE 1 CAPSULE (5 MG TOTAL) BY MOUTH 2 (TWO) TIMES DAILY.  . sildenafil (REVATIO) 20 MG tablet TAKE 3 TO 5 TABLETS BY MOUTH DAILY AS NEEDED  . [DISCONTINUED] apixaban (ELIQUIS) 5 MG TABS tablet Take 5 mg by mouth daily.  . [DISCONTINUED] atorvastatin (LIPITOR) 80 MG tablet TAKE HALF TABLET (40 MG TOTAL) BY MOUTH AT BEDTIME     Allergies:   Xarelto [rivaroxaban] and Dabigatran etexilate mesylate   Social History   Social History  . Marital status: Widowed    Spouse name: N/A  . Number of children: N/A  . Years of education: N/A   Occupational History  . retired Pension scheme manager. Retired    2001. Likes to play golf . Travels with motor home   Social History Main Topics  . Smoking status: Former Smoker    Packs/day: 1.25    Years: 30.00    Types: Cigarettes    Quit date: 02/28/1990  . Smokeless tobacco: Never Used  . Alcohol use 0.0 oz/week     Comment: 04/17/2014 "might drink a 6 pack of beer/year"  . Drug use: No  . Sexual activity: Yes   Other Topics Concern  . None   Social History Narrative   From [place]   Divorced, remarried then widowed, 3 children (from 1st marriage)   Likes to play golf   Prev 4 sport athlete in [place], football fan.      Family History:  The patient's  family history includes Alcohol abuse (age of onset: 11) in his sister; Diabetes in his mother; Heart disease in his mother; Hyperlipidemia in his mother; Hypertension in his mother; Obesity (age of onset: 19) in his mother; Other in his father.   ROS:   Please see the history of present illness.    ROS All other systems reviewed and are negative.   EKGs/Labs/Other Test Reviewed:    EKG:  EKG is  ordered today.  The ekg ordered today demonstrates AFib, HR  81, normal axis, PVCs vs aberrantly conducted beats, QTc 448 ms, no significant change from prior tracing.   Recent Labs: 11/23/2015: ALT 12; BUN 18; Creatinine, Ser 1.05; Hemoglobin 16.1; Platelets 276.0; Potassium 4.0; Sodium 141; TSH 6.05   Recent Lipid Panel    Component Value Date/Time   CHOL 142 11/23/2015 0812   TRIG 150.0 (H) 11/23/2015 0812   HDL 32.30 (L) 11/23/2015 0812   CHOLHDL 4 11/23/2015 0812   VLDL 30.0 11/23/2015 0812   LDLCALC 79 11/23/2015 0812   LDLDIRECT 126.2 12/22/2011 0827     Physical Exam:    VS:  BP (!) 142/90   Pulse 82   Ht 5' 10.5" (1.791 m)   Wt 254 lb 6.4 oz (115.4 kg)   BMI 35.99 kg/m     Wt Readings from Last 3 Encounters:  03/15/16 254 lb 6.4 oz (115.4 kg)  12/11/15 250 lb 1.6 oz (113.4 kg)  11/30/15 251 lb 4 oz (114 kg)     Physical Exam  Constitutional: He is oriented to person, place, and time. He appears well-developed and well-nourished. No distress.  HENT:  Head: Normocephalic and atraumatic.  Eyes: No scleral icterus.  Neck: No JVD present. Carotid bruit is not present.  Cardiovascular: Normal rate, S1 normal and S2 normal.  An irregularly irregular rhythm present.  No murmur heard. Pulmonary/Chest: Effort normal. He has no wheezes. He has no rales.  Abdominal: Soft. There is no tenderness.  Musculoskeletal: He exhibits no edema.  Neurological: He is alert and oriented to person, place, and time.  Skin: Skin is warm and dry.  Psychiatric: He has a normal mood and  affect.    ASSESSMENT:    1. Coronary artery disease involving native coronary artery of native heart without angina pectoris   2. Chronic systolic CHF (congestive heart failure) (HCC)   3. Permanent atrial fibrillation ([place])   4. Essential hypertension   5. Pure hypercholesterolemia   6. S/P AAA repair    PLAN:    In order of problems listed above:  1. CAD - s/p CABG in 1992.  Low risk myoview in 2015.  He is not having angina.  He is not on ASA as he is on Eliquis.  Continue statin.  2. Chronic systolic CHF - 2/2 ICM.  He DC'd Metoprolol Succinate due to stomach issues.  I have asked him to resume this at 1/2 dose (50 mg QD) to see if this helps.  If he has continued issues, would recommend changing him to Coreg.  Continue ACE inhibitor.  He is NYHA 2.  His volume is stable.  3. Permanent AF - He decreased Eliquis on his own.  We discussed the importance of taking this Twice daily to avoid strokes.  CHADS2-VASc=5.  His hemoglobin and creatinine were ok in 9/17.   -  Increase Eliquis back to 5 mg bid    4. HTN - BP borderline elevated.  His BP at home is 130/70s.  Resume Metoprolol Succinate 50 mg QD as noted.   5. HL - Continue statin.  He changed Lipitor to 40.  LDL in 9/17 was 79.  Continue current rx.  6. S/p AAA Repair - F/u with VVS.   Medication Adjustments/Labs and Tests Ordered: Current medicines are reviewed at length with the patient today.  Concerns regarding medicines are outlined above.  Medication changes, Labs and Tests ordered today are outlined in the Patient Instructions noted below. Patient Instructions  Medication Instructions:  1. RESTART TOPROL XL  100 MG TABLET WITH THE DIRECTIONS TO TAKE 1/2 TABLET DAILY TO = 50 MG DAILY 2. MAKE SURE TO TAKE THE ELIQUIS 5 MG TWICE DAILY 3. A REFILL FOR LIPITOR WAS SENT IN (This will be for 40 mg, so you do not have to break these in 1/2)  Labwork: NONE  Testing/Procedures: NONE  Follow-Up: Your physician wants you  to follow-up in: [place], [facility] You will receive a reminder letter in the mail two months in advance. If you don't receive a letter, please call our office to schedule the follow-up appointment.  Any Other Special Instructions Will Be Listed Below (If Applicable).  If you need a refill on your cardiac medications before your next appointment, please call your pharmacy.  Signed, Richardson Dopp, PA-C  03/15/2016 2:54 PM    [facility] [address], [place], [place]  [address] Phone: [phone]; Fax: [phone]

## 2016-03-15 NOTE — Patient Instructions (Addendum)
Medication Instructions:  1. RESTART TOPROL XL 100 MG TABLET WITH THE DIRECTIONS TO TAKE 1/2 TABLET DAILY TO = 50 MG DAILY 2. MAKE SURE TO TAKE THE ELIQUIS 5 MG TWICE DAILY 3. A REFILL FOR LIPITOR WAS SENT IN (This will be for 40 mg, so you do not have to break these in 1/2)  Labwork: NONE  Testing/Procedures: NONE  Follow-Up: Your physician wants you to follow-up in: [place], [facility] You will receive a reminder letter in the mail two months in advance. If you don't receive a letter, please call our office to schedule the follow-up appointment.  Any Other Special Instructions Will Be Listed Below (If Applicable).  If you need a refill on your cardiac medications before your next appointment, please call your pharmacy.

## 2016-03-16 NOTE — Progress Notes (Signed)
Yes that would be reasonable.

## 2016-03-21 ENCOUNTER — Ambulatory Visit: Payer: Medicare Other | Admitting: Physician Assistant

## 2016-03-28 ENCOUNTER — Other Ambulatory Visit: Payer: Self-pay | Admitting: Family Medicine

## 2016-03-28 ENCOUNTER — Other Ambulatory Visit: Payer: Self-pay | Admitting: Cardiology

## 2016-04-16 ENCOUNTER — Other Ambulatory Visit: Payer: Self-pay | Admitting: Family Medicine

## 2016-04-16 NOTE — Telephone Encounter (Signed)
Sent. Thanks.   

## 2016-04-16 NOTE — Telephone Encounter (Signed)
Last office visit 11/30/15.  Ok to refill?

## 2016-04-27 DIAGNOSIS — L851 Acquired keratosis [keratoderma] palmaris et plantaris: Secondary | ICD-10-CM | POA: Diagnosis not present

## 2016-04-27 DIAGNOSIS — Z471 Aftercare following joint replacement surgery: Secondary | ICD-10-CM | POA: Diagnosis not present

## 2016-04-27 DIAGNOSIS — Z96661 Presence of right artificial ankle joint: Secondary | ICD-10-CM | POA: Diagnosis not present

## 2016-05-30 ENCOUNTER — Other Ambulatory Visit (INDEPENDENT_AMBULATORY_CARE_PROVIDER_SITE_OTHER): Payer: Medicare Other

## 2016-05-30 DIAGNOSIS — E039 Hypothyroidism, unspecified: Secondary | ICD-10-CM

## 2016-05-30 LAB — TSH: TSH: 4.13 u[IU]/mL (ref 0.35–4.50)

## 2016-08-16 ENCOUNTER — Telehealth: Payer: Self-pay | Admitting: *Deleted

## 2016-08-16 ENCOUNTER — Other Ambulatory Visit: Payer: Self-pay | Admitting: *Deleted

## 2016-08-16 MED ORDER — METOPROLOL SUCCINATE ER 50 MG PO TB24: 50.0000 mg | ORAL_TABLET | Freq: Every day | ORAL | 6 refills | Status: DC

## 2016-08-16 NOTE — Telephone Encounter (Signed)
At last office visit with [name] patient was instructed as below  AVS Reports   Date/Time Report Action User  03/15/2016 12:43 PM After Visit Summary Printed Michae Kava, [place]  03/15/2016 12:37 PM After Visit Summary Printed Michae Kava, CMA  Patient Instructions   Medication Instructions:  1. RESTART TOPROL XL 100 MG TABLET WITH THE DIRECTIONS TO TAKE 1/2 TABLET DAILY TO = 50 MG DAILY   Patients snapshot has the 100 mg tablet with a sig of one tablet qd ordered. Pharmacy is requesting a refill for a 50 mg tab. Is this okay? Please advise. Thanks, MI

## 2016-08-16 NOTE — Telephone Encounter (Signed)
Mindy, that is fine to send in metoprolol succinate as the 50 mg tablet with the directions to read take 1 tablet daily. If I remember the pt asked for [place] to do it saying the 100 mg tablet so he could have more medication.

## 2016-08-16 NOTE — Telephone Encounter (Signed)
Rx has been sent  

## 2016-09-20 ENCOUNTER — Other Ambulatory Visit: Payer: Self-pay | Admitting: Family Medicine

## 2016-09-20 NOTE — Telephone Encounter (Signed)
Received refill electronically Last refill 09/09/15 #50/5 Last office visit 11/30/15

## 2016-09-21 NOTE — Telephone Encounter (Signed)
Sent. Thanks.   

## 2016-10-17 DIAGNOSIS — Z8551 Personal history of malignant neoplasm of bladder: Secondary | ICD-10-CM | POA: Diagnosis not present

## 2016-10-17 DIAGNOSIS — N400 Enlarged prostate without lower urinary tract symptoms: Secondary | ICD-10-CM | POA: Diagnosis not present

## 2016-12-13 NOTE — Progress Notes (Signed)
Established EVAR   History of Present Illness   [name] is a 83 y.o. ([birth year]) male who presents for routine follow up s/p EVAR (Date: 02/15/11).  Most recent EVAR duplex (Date: 12/11/15) demonstrates: no endoleak and decreasing sac size.  Most recent prior CTA (Date: 05/03/13) demonstrates: no endoleak and decreased sac size.  The patient has no had back or abdominal pain.  Pt has had a total ankle replacement since his last visit.  The patient's PMH, PSH, SH, and FamHx are unchanged from 12/11/15.  Current Outpatient Prescriptions  Medication Sig Dispense Refill  . atorvastatin (LIPITOR) 40 MG tablet Take 1 tablet (40 mg total) by mouth daily. 90 tablet 3  . clotrimazole (LOTRIMIN) 1 % cream Apply 1 application topically as needed (SKIN IRRITATION).    Marland Kitchen clotrimazole (LOTRIMIN) 1 % cream APPLY TO AFFECTED AREA TWICE A DAY 60 g 1  . ELIQUIS 5 MG TABS tablet TAKE 1 TABLET BY MOUTH TWICE A DAY 60 tablet 5  . levothyroxine (SYNTHROID, LEVOTHROID) 137 MCG tablet Take 1 tablet (137 mcg total) by mouth daily before breakfast. 90 tablet 3  . metoprolol succinate (TOPROL-XL) 50 MG 24 hr tablet Take 1 tablet (50 mg total) by mouth daily. Take with or immediately following a meal. 30 tablet 6  . ramipril (ALTACE) 5 MG capsule TAKE 1 CAPSULE (5 MG TOTAL) BY MOUTH 2 (TWO) TIMES DAILY. 180 capsule 3  . ramipril (ALTACE) 5 MG capsule TAKE ONE CAPSULE BY MOUTH TWICE A DAY 60 capsule 5  . sildenafil (REVATIO) 20 MG tablet TAKE 3 TO 5 TABLETS BY MOUTH DAILY AS NEEDED 50 tablet 12   No current facility-administered medications for this visit.     On ROS today: no back or abd pain, no leg sx   Physical Examination   Vitals:   12/16/16 1228  BP: 130/77  Pulse: 81  Resp: 20  Temp: 97.6 F (36.4 C)  TempSrc: Oral  SpO2: 97%  Weight: 258 lb (117 kg)  Height: 5' 10.5" (1.791 m)   Body mass index is 36.5 kg/m.  General Alert, O x 3, WD, NAD  Pulmonary Sym exp, good B air movt, CTA  B  Cardiac RRR, Nl S1, S2, no Murmurs, No rubs, No S3,S4  Vascular Vessel Right Left  Radial Palpable Palpable  Brachial Palpable Palpable  Carotid Palpable, No Bruit Palpable, No Bruit  Aorta Not palpable N/A  Femoral Palpable Palpable  Popliteal Not palpable Not palpable  PT Palpable Palpable  DP Palpable Palpable    Gastro- intestinal soft, non-distended, non-tender to palpation, No guarding or rebound, no HSM, no masses, no CVAT B, No palpable prominent aortic pulse,    Musculo- skeletal M/S 5/5 throughout  , Extremities without ischemic changes  , Non-pitting edema present: R 1+, No visible varicosities , No Lipodermatosclerosis present, healed calf and ankle incisions  Neurologic Pain and light touch intact in extremities , Motor exam as listed above    Radiology     Ct Angio Abdomen Pelvis  W &/or Wo Contrast  Result Date: 12/16/2016 CLINICAL DATA:  Post endovascular repair of abdominal aorta. EXAM: CTA ABDOMEN AND PELVIS WITH CONTRAST TECHNIQUE: Multidetector CT imaging of the abdomen and pelvis was performed using the standard protocol during bolus administration of intravenous contrast. Multiplanar reconstructed images and MIPs were obtained and reviewed to evaluate the vascular anatomy. CONTRAST:  75 cc Isovue 370 Creatinine was obtained on site at [facility] at [address]  Results: Creatinine 0.9 mg/dL. COMPARISON:  CT abdomen pelvis - 05/03/2013 ; 02/10/2012 FINDINGS: VASCULAR Aorta: Post endovascular repair of infrarenal abdominal aortic aneurysm. The stent graft is widely patent. The proximal end of the stent graft is well apposed against the walls of the infrarenal abdominal aorta while the distal limbs are well apposed against the walls of the bilateral common iliac artery's. There is no definitive opacification of the excluded native abdominal aortic aneurysm sac to suggest the presence of an endoleak. Additionally, there has been continued decrease in size  of excluded native abdominal aortic aneurysm sac, currently measuring approximately 4.4 x 4.8 x 4.3 cm as measured in greatest oblique short axis axial (image 108, series 5), coronal (image 64, series 601) and sagittal (image 100, series 602), previously, 5.2 x 6.2 x 5.2 cm (when compared to the 04/2013 examination. No periaortic stranding. Celiac: There is a minimal amount of atherosclerotic plaque involving the origin of the celiac artery, not resulting in hemodynamically significant stenosis. Conventional branching pattern. SMA: There is a minimal amount of mixed calcified and noncalcified atherosclerotic plaque involving the origin the celiac artery, not resulting in a hemodynamically significant stenosis. Conventional branching pattern. Renals: Duplicated left renal artery with tiny accessory left-sided renal artery which supplies the superior pole the left kidney. The bilateral dominant left renal arteries are widely patent without hemodynamically significant narrowing. No vessel irregularity to suggest FMD. IMA: Occluded at its origin with early reconstitution via collateral supply from the SMA. Inflow: As above, the distal limbs of the abdominal stent graft are well apposed against the walls of the bilateral common iliac arteries. Saccular ectasia involving the distal aspect of the native left common iliac artery is unchanged, measuring approximately 1.7 cm in greatest oblique coronal diameter (coronal image 81, series 601). There is a moderate amount of mixed calcified and noncalcified atherosclerotic plaque within the distal aspects of the native bilateral common iliac arteries as well as the bilateral external iliac arteries, not resulting in hemodynamically significant stenosis. The bilateral internal iliac artery is are mildly disease though patent and of normal caliber. Proximal Outflow: Moderate amount of mixed calcified and noncalcified atherosclerotic plaque within the bilateral common and  superficial femoral artery is, not definitely resulting in hemodynamically significant stenosis. Veins: The pelvic venous system and IVC appears widely patent on this arterial phase examination. Review of the MIP images confirms the above findings. NON-VASCULAR Lower chest: Limited visualization of the lower thorax demonstrates minimal ground-glass atelectasis within the medial basilar segment of the image right lower lobe. No focal airspace opacities. No pleural effusion. Normal heart size. Post median sternotomy and CABG. Calcifications within native coronary arteries. No pericardial effusion. Hepatobiliary: Normal hepatic contour. No discrete hepatic lesions. Normal appearance of the gallbladder given degree distention. No radiopaque gallstones. No intra extrahepatic biliary duct dilatation. No ascites. Pancreas: Normal appearance of the pancreas Spleen: Normal appearance of the spleen Adrenals/Urinary Tract: There is symmetric enhancement and excretion of the bilateral kidneys. Punctate calcifications about the anterior aspect of the right renal hilum are favored to be vascular in etiology. No definite renal stones. Similar appearance of left-sided renal cysts. Dominant cyst arising from the superior pole the left kidney measures approximately 2.2 cm in diameter. Additional hypoattenuating renal lesions are too small to adequately characterize of favored to represent additional renal cysts. No urinary obstruction or perinephric stranding. Normal appearance the bilateral adrenal glands. There is mild diffuse thickening of the dome of the urinary bladder wall, potentially accentuated due to underdistention. Stomach/Bowel:  Small hiatal hernia. Note is made of a duodenal diverticulum. Moderate colonic stool burden without evidence of enteric obstruction. Normal appearance of the terminal ileum. The appendix is nonvisualized, however there is no pericecal inflammatory change. No pneumoperitoneum, pneumatosis or  portal venous gas. Lymphatic: No bulky retroperitoneal, mesenteric, pelvic or inguinal lymphadenopathy. Reproductive: Enlarged prostate gland with dystrophic calcifications. No free fluid the pelvic cul-de-sac. Other: Tiny mesenteric fat containing periumbilical hernia. Musculoskeletal: No acute or aggressive osseous abnormalities. Stigmata of DISH within the thoracic spine. Mild-to-moderate multilevel lumbar spine DDD, worse at L2-L3 with disc space height loss, endplate irregularity and sclerosis. Note is made of a right L5-S1 assimilation joint. IMPRESSION: VASCULAR 1. Post endovascular repair of infrarenal abdominal aortic aneurysm with continued reduction in size of excluded abdominal aortic aneurysm sac, currently measuring 4.8 cm in maximal diameter, previously, 6.2 cm. No evidence of an endoleak. Aortic aneurysm NOS (ICD10-I71.9). 2.  Aortic Atherosclerosis (ICD10-I70.0). NON-VASCULAR 1. Enlarged prostate with mild diffuse thickening of the dome of the urinary bladder wall, potentially accentuated due to underdistention though could be seen in the setting of chronic bladder outlet obstruction. If not recently performed, correlation with DRE is recommended. Electronically Signed   By: Sandi Mariscal M.D.   On: 12/16/2016 11:44   Based on review of the CTA: Good infrarenal position of endograft, no endoleak, no limb dysfunction.  Shrinkage in sac down to 4.8 cm from 6.2 cm.    Medical Decision Making   [name] is a 83 y.o. male who presents s/p EVAR.  Pt is asymptomatic with decreasing sac size.   I discussed with the patient the importance of surveillance of the endograft.  The next endograft duplex will be scheduled for 12 months.  Pt will follow up with my NP.  No further CTA routinely will be needed for endograft surveillance.  Thank you for allowing [place] to participate in this patient's care.   Adele Barthel, MD, FACS [facility] Office:  [phone] Pager: [phone]

## 2016-12-16 ENCOUNTER — Ambulatory Visit
Admission: RE | Admit: 2016-12-16 | Discharge: 2016-12-16 | Disposition: A | Discharge status: Home / Self Care | Payer: Medicare Other | Source: Ambulatory Visit | Attending: Family | Admitting: Family

## 2016-12-16 ENCOUNTER — Ambulatory Visit (INDEPENDENT_AMBULATORY_CARE_PROVIDER_SITE_OTHER): Payer: Medicare Other | Admitting: Vascular Surgery

## 2016-12-16 ENCOUNTER — Ambulatory Visit: Payer: Medicare Other | Admitting: Family

## 2016-12-16 ENCOUNTER — Encounter: Payer: Self-pay | Admitting: Vascular Surgery

## 2016-12-16 VITALS — BP 130/77 | HR 81 | Temp 97.6°F | Resp 20 | Ht 70.5 in | Wt 258.0 lb

## 2016-12-16 DIAGNOSIS — I2510 Atherosclerotic heart disease of native coronary artery without angina pectoris: Secondary | ICD-10-CM | POA: Diagnosis not present

## 2016-12-16 DIAGNOSIS — I714 Abdominal aortic aneurysm, without rupture, unspecified: Secondary | ICD-10-CM

## 2016-12-16 MED ORDER — IOPAMIDOL (ISOVUE-370) INJECTION 76%
75.0000 mL | Freq: Once | INTRAVENOUS | Status: AC | PRN
  Administered 2016-12-16: 75 mL via INTRAVENOUS

## 2016-12-21 ENCOUNTER — Other Ambulatory Visit: Payer: Self-pay | Admitting: Family Medicine

## 2017-01-10 NOTE — Addendum Note (Signed)
Addended by: Lianne Cure A on: 01/10/2017 03:43 PM   Modules accepted: Orders

## 2017-01-23 ENCOUNTER — Other Ambulatory Visit: Payer: Self-pay | Admitting: Family Medicine

## 2017-02-16 ENCOUNTER — Other Ambulatory Visit: Payer: Self-pay | Admitting: Family Medicine

## 2017-03-06 DIAGNOSIS — Z23 Encounter for immunization: Secondary | ICD-10-CM | POA: Diagnosis not present

## 2017-03-21 ENCOUNTER — Encounter: Payer: Self-pay | Admitting: Family Medicine

## 2017-03-21 ENCOUNTER — Ambulatory Visit (INDEPENDENT_AMBULATORY_CARE_PROVIDER_SITE_OTHER): Payer: Medicare Other | Admitting: Family Medicine

## 2017-03-21 ENCOUNTER — Other Ambulatory Visit: Payer: Self-pay | Admitting: Cardiology

## 2017-03-21 VITALS — BP 162/88 | HR 83 | Temp 97.5°F | Ht 71.0 in | Wt 256.2 lb

## 2017-03-21 DIAGNOSIS — E039 Hypothyroidism, unspecified: Secondary | ICD-10-CM | POA: Diagnosis not present

## 2017-03-21 DIAGNOSIS — I10 Essential (primary) hypertension: Secondary | ICD-10-CM | POA: Diagnosis not present

## 2017-03-21 DIAGNOSIS — E7800 Pure hypercholesterolemia, unspecified: Secondary | ICD-10-CM

## 2017-03-21 DIAGNOSIS — R739 Hyperglycemia, unspecified: Secondary | ICD-10-CM

## 2017-03-21 DIAGNOSIS — Z0000 Encounter for general adult medical examination without abnormal findings: Secondary | ICD-10-CM

## 2017-03-21 DIAGNOSIS — I482 Chronic atrial fibrillation: Secondary | ICD-10-CM

## 2017-03-21 DIAGNOSIS — I4821 Permanent atrial fibrillation: Secondary | ICD-10-CM

## 2017-03-21 DIAGNOSIS — Z7189 Other specified counseling: Secondary | ICD-10-CM

## 2017-03-21 LAB — LIPID PANEL
CHOL/HDL RATIO: 5
Cholesterol: 168 mg/dL (ref 0–200)
HDL: 37.1 mg/dL — ABNORMAL LOW (ref >39.00)
NONHDL: 131.39
Triglycerides: 254 mg/dL — ABNORMAL HIGH (ref 0.0–149.0)
VLDL: 50.8 mg/dL — AB (ref 0.0–40.0)

## 2017-03-21 LAB — CBC WITH DIFFERENTIAL/PLATELET
BASOS PCT: 0.7 % (ref 0.0–3.0)
Basophils Absolute: 0 10*3/uL (ref 0.0–0.1)
EOS PCT: 2.3 % (ref 0.0–5.0)
Eosinophils Absolute: 0.1 10*3/uL (ref 0.0–0.7)
HEMATOCRIT: 46.1 % (ref 39.0–52.0)
HEMOGLOBIN: 15.4 g/dL (ref 13.0–17.0)
LYMPHS PCT: 25.2 % (ref 12.0–46.0)
Lymphs Abs: 1.5 10*3/uL (ref 0.7–4.0)
MCHC: 33.3 g/dL (ref 30.0–36.0)
MCV: 91.9 fl (ref 78.0–100.0)
MONOS PCT: 9.4 % (ref 3.0–12.0)
Monocytes Absolute: 0.6 10*3/uL (ref 0.1–1.0)
Neutro Abs: 3.8 10*3/uL (ref 1.4–7.7)
Neutrophils Relative %: 62.4 % (ref 43.0–77.0)
Platelets: 228 10*3/uL (ref 150.0–400.0)
RBC: 5.02 Mil/uL (ref 4.22–5.81)
RDW: 14.1 % (ref 11.5–15.5)
WBC: 6.1 10*3/uL (ref 4.0–10.5)

## 2017-03-21 LAB — COMPREHENSIVE METABOLIC PANEL
ALBUMIN: 4.1 g/dL (ref 3.5–5.2)
ALT: 11 U/L (ref 0–53)
AST: 14 U/L (ref 0–37)
Alkaline Phosphatase: 75 U/L (ref 39–117)
BUN: 12 mg/dL (ref 6–23)
CALCIUM: 9.4 mg/dL (ref 8.4–10.5)
CHLORIDE: 103 meq/L (ref 96–112)
CO2: 31 mEq/L (ref 19–32)
Creatinine, Ser: 0.92 mg/dL (ref 0.40–1.50)
GFR: 83.91 mL/min (ref >60.00)
Glucose, Bld: 103 mg/dL — ABNORMAL HIGH (ref 70–99)
POTASSIUM: 5 meq/L (ref 3.5–5.1)
SODIUM: 139 meq/L (ref 135–145)
Total Bilirubin: 0.7 mg/dL (ref 0.2–1.2)
Total Protein: 7.6 g/dL (ref 6.0–8.3)

## 2017-03-21 LAB — TSH: TSH: 4.53 u[IU]/mL — AB (ref 0.35–4.50)

## 2017-03-21 LAB — LDL CHOLESTEROL, DIRECT: LDL DIRECT: 99 mg/dL

## 2017-03-21 LAB — HEMOGLOBIN A1C: Hgb A1c MFr Bld: 5.8 % (ref 4.6–6.5)

## 2017-03-21 NOTE — Progress Notes (Signed)
I have personally reviewed the Medicare Annual Wellness questionnaire and have noted 1. The patient's medical and social history 2. Their use of alcohol, tobacco or illicit drugs 3. Their current medications and supplements 4. The patient's functional ability including ADL's, fall risks, home safety risks and hearing or visual             impairment. 5. Diet and physical activities 6. Evidence for depression or mood disorders  The patients weight, height, BMI have been recorded in the chart and visual acuity is per eye clinic.  I have made referrals, counseling and provided education to the patient based review of the above and I have provided the pt with a written personalized care plan for preventive services.  Provider list updated- see scanned forms.  Routine anticipatory guidance given to patient.  See health maintenance. The possibility exists that previously documented standard health maintenance information may have been brought forward from a previous encounter into this note.  If needed, that same information has been updated to reflect the current situation based on today's encounter.    Flu 2018 Shingles 2012 PNA 2016 Tetanus 2008 Colonoscopy not due given his age.    Prostate cancer screening and PSA options (with potential risks and benefits of testing vs not testing) were discussed along with recent recs/guidelines.  He declined testing PSA at this point. Advance directive- both daughters and son designated if patient were incapacitated.  Cognitive function addressed- see scanned forms- and if abnormal then additional documentation follows.   ED some better with sildenfail. No ADE on med.   AF.  No sx of tachycardia. He doesn't notice a skip unless he is supine- and that is occasional, but that isn't heart racing.  On Eliquis w/o bleeding.  Is compliant.    Hypothyroidism.  No neck mass, no pain, no dysphagia.  Labs pending.   Elevated Cholesterol: Using medications  without problems:yes Muscle aches: not from statin.   Diet compliance: "not as good as it should be but I'm watching it."  D/w pt about diet.  Exercise: encouarged.   Hypertension:    Using medication without problems or lightheadedness: yes Chest pain with exertion:no Edema:no Short of breath:no His BP is usually up at MD visits and is much lower at home.  D/w pt.    PMH and SH reviewed  Meds, vitals, and allergies reviewed.   ROS: Per HPI.  Unless specifically indicated otherwise in HPI, the patient denies:  General: fever. Eyes: acute vision changes ENT: sore throat Cardiovascular: chest pain Respiratory: SOB GI: vomiting GU: dysuria Musculoskeletal: acute back pain Derm: acute rash Neuro: acute motor dysfunction Psych: worsening mood Endocrine: polydipsia Heme: bleeding Allergy: hayfever  GEN: nad, alert and oriented HEENT: mucous membranes moist NECK: supple w/o LA, no tmg CV: IRR PULM: ctab, no inc wob ABD: soft, +bs EXT: no edema

## 2017-03-21 NOTE — Patient Instructions (Addendum)
Call about an eye exam.  Thanks for getting a flu shot.  Call about cardiology follow up.   Go to the lab on the way out.  We'll contact you with your lab report. Don't change your meds for now.  Take care.  Glad to see you.

## 2017-03-23 NOTE — Assessment & Plan Note (Signed)
On Eliquis w/o bleeding.  Is compliant.  No change in meds. See notes on labs.

## 2017-03-23 NOTE — Assessment & Plan Note (Signed)
Advance directive- both daughters and son designated if patient were incapacitated. 

## 2017-03-23 NOTE — Telephone Encounter (Signed)
Eliquis 5mg  refill request received; pt is 84 yrs old, wt-116.2kg, Crea-0.92 on 03/21/17, last seen by Richardson Dopp on 03/15/2016 & was due to f/u in 6 months; therefore pt is over due to be seen by Cardiology. Called pt & was sent to VM & left a msg for pt to call to schedule an appt with Cardiology. Will send in a 1 month supply for the pt.

## 2017-03-23 NOTE — Assessment & Plan Note (Signed)
No neck mass, no pain, no dysphagia.  Labs pending.

## 2017-03-23 NOTE — Assessment & Plan Note (Signed)
Usually controlled at home.  No change in meds.  He can monitor BP at home.  Update me if persistently elevated.  See notes on labs.

## 2017-03-23 NOTE — Assessment & Plan Note (Signed)
Flu 2018 Shingles 2012 PNA 2016 Tetanus 2008 Colonoscopy not due given his age.    Prostate cancer screening and PSA options (with potential risks and benefits of testing vs not testing) were discussed along with recent recs/guidelines.  He declined testing PSA at this point. Advance directive- both daughters and son designated if patient were incapacitated.  Cognitive function addressed- see scanned forms- and if abnormal then additional documentation follows.

## 2017-03-23 NOTE — Assessment & Plan Note (Signed)
Continue statin, see notes on labs.  D/w pt about diet and exercise.

## 2017-03-30 ENCOUNTER — Encounter: Payer: Self-pay | Admitting: Physician Assistant

## 2017-04-06 NOTE — Progress Notes (Signed)
Cardiology Office Note:    Date:  04/07/2017   ID:  [name], DOB April 30, 1933, MRN [id number]  PCP:  Tonia Ghent, MD  Cardiologist:  Nelva Bush, MD / Richardson Dopp, PA-C  Vascular surgery: Dr. Bridgett Larsson   Referring MD: Tonia Ghent, MD   Chief Complaint  Patient presents with  . Follow-up    CAD, AFib    History of Present Illness:    [name] is a 84 y.o. male with a hx of coronary artery disease status post coronary artery bypass grafting in 3299, systolic heart failure with EF 40-45, permanent atrial fibrillation, abdominal aortic aneurysm status post endovascular stenting, chronic kidney disease, chronic anticoagulation with apixaban.  He was last seen in clinic by me 03/15/16.  [name] returns for follow-up.  He is here alone.  He is doing well.  He denies chest discomfort, shortness of breath, syncope, paroxysmal nocturnal dyspnea or significant edema.  He has chronic right ankle edema from a prior injury.  Prior CV studies:   The following studies were reviewed today:  Echo (03/2013):  Inferior and inferolateral AK, EF 40-45%, mild MR, mod LAE, mod RAE, mod TR, mild PI, PASP 46  Lexiscan Myoview (03/2013):  No ischemia; not gated.  Myoview (01/2011):  Fixed inferior defect suggestive of prior MI versus diaphragmatic attenuation, apical reversibility possibly suspicious for small area of infarction with peri-infarct ischemia, no significant change from prior study, not gated, low risk study.   Echo (05/2010):  Moderate LVH, EF 55-65%, mild AI, mild MR, moderate LAE, mild RAE, moderate to severe TR, PASP 45.  Past Medical History:  Diagnosis Date  . AAA (abdominal aortic aneurysm) ([place])    s/p stent graft repair 2012  . Arthritis    "right ankle" (04/17/2014)  . Atrial fibrillation ([place])    a. permanent;  b. Hematuria on Pradaxa;  c. cold intol and chills with Xarelto  . Bladder cancer ([place])   . Cardiomyopathy [facility])    a. Echo (03/2013):  Inferior and inferolateral AK, EF 40-45%, mild MR, mod LAE, mod RAE, mod TR, mild PI, PASP 46  . Chronic kidney disease   . COPD (chronic obstructive pulmonary disease) ([place])   . Coronary artery disease s/p cabg x7  1992   a. s/p CABG in 1992 (X 7 - Dr. Redmond Pulling);  b.  Myoview (01/2011): Fixed inferior defect suggestive of prior MI versus diaphragmatic attenuation, apical reversibility possibly suspicious for small area of infarction with peri-infarct ischemia, no significant change from prior study, not gated, low risk study;  c.  Echo (05/2010): Mod LVH, EF 55-65%, mild AI, mild MR, moderate LAE, mild RAE, moderate   . Dysrhythmia   . Hematuria bladder tumor ---  followed by Dr Karsten Ro  . Hiatal hernia    repaired in 1992 w/OHS  . Hx of cardiovascular stress test    Lexiscan Myoview (03/2013):  No ischemia; not gated.  . Hyperglycemia diet controlled  . Hyperlipidemia   . Hypertension   . Hypothyroidism   . Obesity   . Organic impotence   . S/P AAA repair using bifurcation graft   . Sleep apnea no rx cpap   mild- tested  2012  . Ventricular ectopy    mild    Past Surgical History:  Procedure Laterality Date  . ABDOMINAL AORTIC ANEURYSM REPAIR  2012   using bifurcation graft  . CARDIAC CATHETERIZATION  03-1990   Positive  . CARDIOVASCULAR STRESS TEST  02-01-2011  NUCLEAR NO EXERCISE   LOW RISK STUDY.  SMALL APICAL DEFECT. NO EVIDENCE OF MULTIZONE ISCHEMIA.  Marland Kitchen CORONARY ARTERY BYPASS GRAFT  05/1990   CABG X 7  . ENDOVASCULAR STENT INSERTION  02/15/2011   Procedure: ENDOVASCULAR STENT GRAFT INSERTION;  Surgeon: Hinda Lenis, MD;  Location: [facility];  Service: Vascular;  Laterality: N/A;  Insertion of endovascular stent graft   . ETT  10/31/00   WNL  . HIATAL HERNIA REPAIR  1992   "repaired when I had my OHS"  . Hospital - Afib  03/28/02  . INGUINAL HERNIA REPAIR Left 1997  . JOINT REPLACEMENT Right 04-17-14   Ankle  . TOTAL ANKLE ARTHROPLASTY Right 04/17/2014   Procedure: RIGHT  TOTAL ANKLE ARTHOPLASTY;  Surgeon: Wylene Simmer, MD;  Location: [facility];  Service: Orthopedics;  Laterality: Right;  . TOTAL ANKLE REPLACEMENT Right 04/17/2014  . TRANSURETHRAL RESECTION OF BLADDER TUMOR  03/25/2011   Procedure: TRANSURETHRAL RESECTION OF BLADDER TUMOR (TURBT);  Surgeon: Claybon Jabs, MD;  Location[facility];  Service: Urology;  Laterality: N/A;    Current Medications: Current Meds  Medication Sig  . apixaban (ELIQUIS) 5 MG TABS tablet Take 1 tablet (5 mg total) by mouth 2 (two) times daily.  . clotrimazole (LOTRIMIN) 1 % cream Apply 1 application topically as needed (SKIN IRRITATION).  Marland Kitchen levothyroxine (SYNTHROID, LEVOTHROID) 137 MCG tablet TAKE ONE TABLET BY MOUTH EVERY MORNING BEFORE BREAKFAST  . metoprolol succinate (TOPROL-XL) 50 MG 24 hr tablet Take 1 tablet (50 mg total) by mouth daily. Take with or immediately following a meal.  . ramipril (ALTACE) 5 MG capsule TAKE 1 CAPSULE (5 MG TOTAL) BY MOUTH 2 (TWO) TIMES DAILY.  . sildenafil (REVATIO) 20 MG tablet TAKE 3 TO 5 TABLETS BY MOUTH DAILY AS NEEDED     Allergies:   Xarelto [rivaroxaban] and Dabigatran etexilate mesylate   Social History   Tobacco Use  . Smoking status: Former Smoker    Packs/day: 1.25    Years: 30.00    Pack years: 37.50    Types: Cigarettes    Last attempt to quit: 02/28/1990    Years since quitting: 27.1  . Smokeless tobacco: Never Used  Substance Use Topics  . Alcohol use: No    Alcohol/week: 0.0 oz    Frequency: Never    Comment: basically no alcohol use.  only very rare alcohol use.   . Drug use: No     Family Hx: The patient's family history includes Alcohol abuse (age of onset: 16) in his sister; Diabetes in his mother; Heart disease in his mother; Hyperlipidemia in his mother; Hypertension in his mother; Obesity (age of onset: 80) in his mother; Other in his father. There is no history of Colon cancer, Prostate cancer, or Anesthesia problems.  ROS:   Please see  the history of present illness.    ROS All other systems reviewed and are negative.   EKGs/Labs/Other Test Reviewed:    EKG:  EKG is   ordered today.  The ekg ordered today demonstrates atrial fibrillation, HR 75, no change from prior tracing  Recent Labs: 03/21/2017: ALT 11; BUN 12; Creatinine, Ser 0.92; Hemoglobin 15.4; Platelets 228.0; Potassium 5.0; Sodium 139; TSH 4.53   Recent Lipid Panel Lab Results  Component Value Date/Time   CHOL 168 03/21/2017 10:31 AM   TRIG 254.0 (H) 03/21/2017 10:31 AM   HDL 37.10 (L) 03/21/2017 10:31 AM   CHOLHDL 5 03/21/2017 10:31 AM   LDLCALC 79  11/23/2015 08:12 AM   LDLDIRECT 99.0 03/21/2017 10:31 AM    Physical Exam:    VS:  BP (!) 180/80   Pulse 88   Ht 5\' 11"  (1.803 m)   Wt 255 lb 12.8 oz (116 kg)   SpO2 98%   BMI 35.68 kg/m     Wt Readings from Last 3 Encounters:  04/07/17 255 lb 12.8 oz (116 kg)  03/21/17 256 lb 4 oz (116.2 kg)  12/16/16 258 lb (117 kg)     Physical Exam  Constitutional: He is oriented to person, place, and time. He appears well-developed and well-nourished. No distress.  HENT:  Head: Normocephalic and atraumatic.  Neck: Carotid bruit is not present.  Cardiovascular: Normal rate. An irregularly irregular rhythm present.  No murmur heard. Pulmonary/Chest: Effort normal. He has no wheezes. He has no rales.  Abdominal: Soft.  Musculoskeletal: He exhibits edema (trace R ankle edema; no L ankle edema).  Neurological: He is alert and oriented to person, place, and time.  Skin: Skin is warm and dry.    ASSESSMENT & PLAN:    1.  Coronary artery disease History of CABG in 1992 with Dr. Redmond Pulling.  Low risk Myoview in 2015.  He remains fairly active without symptoms of angina.  He is not on aspirin as he is on apixaban.  Continue statin therapy.  2.  Permanent atrial fibrillation (HCC)  Rate controlled.  CHADS2-VASc=5 (84 yo, HTN, CAD/vasc dz, CHF).  Continue current dose of Apixaban (weight >60 kg; creatinine  <1.5).  3.  Chronic systolic CHF (congestive heart failure) (HCC) EF 40-45.  NYHA 2.  He does not require diuretic therapy.  Continue ACE inhibitor, beta-blocker.  4.  Essential hypertension He has "white coat hypertension."  I have encouraged him to check his blood pressure from time to time at home.  If it is >130/80 on average, he knows to contact us.  5.  S/P AAA repair Follow-up with vascular surgery as planned.  6.  Pure hypercholesterolemia LDL optimal on most recent lab work.  Continue current Rx.     Dispo:  Return in about 1 year (around 04/07/2018) for Routine Follow Up, w/ Richardson Dopp, PA-C.   Medication Adjustments/Labs and Tests Ordered: Current medicines are reviewed at length with the patient today.  Concerns regarding medicines are outlined above.  Tests Ordered: Orders Placed This Encounter  Procedures  . EKG 12-Lead   Medication Changes: Meds ordered this encounter  Medications  . apixaban (ELIQUIS) 5 MG TABS tablet    Sig: Take 1 tablet (5 mg total) by mouth 2 (two) times daily.    Dispense:  60 tablet    Refill:  11    Order Specific Question:   Supervising Provider    Answer:   Deboraha Sprang [[id number]]    Signed, Richardson Dopp, PA-C  04/07/2017 9:52 AM    [facility] [place], [place], [place]  [address] Phone: [phone]; Fax: [phone]

## 2017-04-07 ENCOUNTER — Encounter: Payer: Self-pay | Admitting: Physician Assistant

## 2017-04-07 ENCOUNTER — Ambulatory Visit (INDEPENDENT_AMBULATORY_CARE_PROVIDER_SITE_OTHER): Payer: Medicare Other | Admitting: Physician Assistant

## 2017-04-07 VITALS — BP 180/80 | HR 88 | Ht 71.0 in | Wt 255.8 lb

## 2017-04-07 DIAGNOSIS — I482 Chronic atrial fibrillation: Secondary | ICD-10-CM

## 2017-04-07 DIAGNOSIS — I5022 Chronic systolic (congestive) heart failure: Secondary | ICD-10-CM

## 2017-04-07 DIAGNOSIS — I4821 Permanent atrial fibrillation: Secondary | ICD-10-CM

## 2017-04-07 DIAGNOSIS — I10 Essential (primary) hypertension: Secondary | ICD-10-CM | POA: Diagnosis not present

## 2017-04-07 DIAGNOSIS — Z8679 Personal history of other diseases of the circulatory system: Secondary | ICD-10-CM

## 2017-04-07 DIAGNOSIS — E7800 Pure hypercholesterolemia, unspecified: Secondary | ICD-10-CM | POA: Diagnosis not present

## 2017-04-07 DIAGNOSIS — I2510 Atherosclerotic heart disease of native coronary artery without angina pectoris: Secondary | ICD-10-CM

## 2017-04-07 DIAGNOSIS — Z9889 Other specified postprocedural states: Secondary | ICD-10-CM | POA: Diagnosis not present

## 2017-04-07 MED ORDER — APIXABAN 5 MG PO TABS: 5.0000 mg | ORAL_TABLET | Freq: Two times a day (BID) | ORAL | 11 refills | Status: DC

## 2017-04-07 NOTE — Patient Instructions (Signed)
Medication Instructions:  Refill was sent in for Eliquis   Labwork: None ordered today  Testing/Procedures: None ordered today  Follow-Up: Your physician wants you to follow-up in: 1 year with [name], [facility] You will receive a reminder letter in the mail two months in advance. If you don't receive a letter, please call our office to schedule the follow-up appointment.   Any Other Special Instructions Will Be Listed Below (If Applicable). Monitor blood pressure and call if your blood pressure is consistently 130/80 or higher. [phone]    If you need a refill on your cardiac medications before your next appointment, please call your pharmacy.

## 2017-04-14 ENCOUNTER — Other Ambulatory Visit: Payer: Self-pay | Admitting: *Deleted

## 2017-04-14 MED ORDER — RAMIPRIL 5 MG PO CAPS: ORAL_CAPSULE | ORAL | 3 refills | Status: DC

## 2017-04-14 MED ORDER — CLOTRIMAZOLE 1 % EX CREA: 1.0000 "application " | TOPICAL_CREAM | CUTANEOUS | 0 refills | Status: DC | PRN

## 2017-04-14 MED ORDER — LEVOTHYROXINE SODIUM 137 MCG PO TABS: 137.0000 ug | ORAL_TABLET | Freq: Every day | ORAL | 1 refills | Status: DC

## 2017-04-17 ENCOUNTER — Other Ambulatory Visit: Payer: Self-pay | Admitting: *Deleted

## 2017-04-17 NOTE — Telephone Encounter (Signed)
Faxed refill request. Atorvastatin Last office visit:    Last Filled:   03/21/17  CPE 90 tablet 3 03/15/2016 06/13/2016  Last filled by Cardiology. Please advise.

## 2017-04-18 MED ORDER — ATORVASTATIN CALCIUM 40 MG PO TABS: 40.0000 mg | ORAL_TABLET | Freq: Every day | ORAL | 3 refills | Status: DC

## 2017-04-18 NOTE — Telephone Encounter (Signed)
Sent. Thanks.   

## 2017-07-04 DIAGNOSIS — H43813 Vitreous degeneration, bilateral: Secondary | ICD-10-CM | POA: Diagnosis not present

## 2017-07-04 DIAGNOSIS — H2513 Age-related nuclear cataract, bilateral: Secondary | ICD-10-CM | POA: Diagnosis not present

## 2017-07-04 DIAGNOSIS — H02831 Dermatochalasis of right upper eyelid: Secondary | ICD-10-CM | POA: Diagnosis not present

## 2017-07-04 DIAGNOSIS — H02834 Dermatochalasis of left upper eyelid: Secondary | ICD-10-CM | POA: Diagnosis not present

## 2017-07-13 DIAGNOSIS — H2511 Age-related nuclear cataract, right eye: Secondary | ICD-10-CM | POA: Diagnosis not present

## 2017-09-03 DIAGNOSIS — H2512 Age-related nuclear cataract, left eye: Secondary | ICD-10-CM | POA: Diagnosis not present

## 2017-09-07 DIAGNOSIS — H2512 Age-related nuclear cataract, left eye: Secondary | ICD-10-CM | POA: Diagnosis not present

## 2017-10-17 DIAGNOSIS — Z8551 Personal history of malignant neoplasm of bladder: Secondary | ICD-10-CM | POA: Diagnosis not present

## 2017-10-17 DIAGNOSIS — N400 Enlarged prostate without lower urinary tract symptoms: Secondary | ICD-10-CM | POA: Diagnosis not present

## 2017-10-23 ENCOUNTER — Other Ambulatory Visit: Payer: Self-pay | Admitting: Family Medicine

## 2017-10-23 NOTE — Telephone Encounter (Signed)
Electronic refill request. Sildenafil Last office visit:   03/21/17 Last Filled:    50 tablet 12 09/21/2016  Please advise.

## 2017-10-24 NOTE — Telephone Encounter (Signed)
Sent. Thanks.   

## 2018-04-09 NOTE — Progress Notes (Signed)
Cardiology Office Note:    Date:  04/10/2018   ID:  [name], DOB Jun 29, 1933, MRN [id number]  PCP:  Tonia Ghent, MD  Cardiologist:  Sherren Mocha, MD / Richardson Dopp, PA-C  Electrophysiologist:  None   Referring MD: Tonia Ghent, MD   Chief Complaint  Patient presents with  . Follow-up    CAD, CHF, AFib     History of Present Illness:    [name] is a 85 y.o. male with coronary artery disease status post coronary artery bypass grafting in 8756, systolic heart failure with EF 40-45, permanent atrial fibrillation, abdominal aortic aneurysm status post endovascular stenting, chronic kidney disease, chronic anticoagulation with apixaban.  He was last seen in 03/2017.     [name] returns for follow up. He is here alone.  He is doing well.  He has not had any chest pain, significant shortness of breath, syncope, paroxysmal nocturnal dyspnea.  He has chronic R ankle swelling related to a prior surgery.  He has a lot of diarrhea that seems to be related to Toprol.    Prior CV studies:   The following studies were reviewed today:  Echo (03/2013):  Inferior and inferolateral AK, EF 40-45%, mild MR, mod LAE, mod RAE, mod TR, mild PI, PASP 46  Lexiscan Myoview (03/2013):  No ischemia; not gated.  Myoview (01/2011):  Fixed inferior defect suggestive of prior MI versus diaphragmatic attenuation, apical reversibility possibly suspicious for small area of infarction with peri-infarct ischemia, no significant change from prior study, not gated, low risk study.   Echo (05/2010):  Moderate LVH, EF 55-65%, mild AI, mild MR, moderate LAE, mild RAE, moderate to severe TR, PASP 45.  Past Medical History:  Diagnosis Date  . AAA (abdominal aortic aneurysm) ([place])    s/p stent graft repair 2012  . Arthritis    "right ankle" (04/17/2014)  . Atrial fibrillation ([place])    a. permanent;  b. Hematuria on Pradaxa;  c. cold intol and chills with Xarelto  . Bladder cancer ([place])    . Cardiomyopathy [facility])    a. Echo (03/2013): Inferior and inferolateral AK, EF 40-45%, mild MR, mod LAE, mod RAE, mod TR, mild PI, PASP 46  . Chronic kidney disease   . COPD (chronic obstructive pulmonary disease) ([place])   . Coronary artery disease s/p cabg x7  1992   a. s/p CABG in 1992 (X 7 - Dr. Redmond Pulling);  b.  Myoview (01/2011): Fixed inferior defect suggestive of prior MI versus diaphragmatic attenuation, apical reversibility possibly suspicious for small area of infarction with peri-infarct ischemia, no significant change from prior study, not gated, low risk study;  c.  Echo (05/2010): Mod LVH, EF 55-65%, mild AI, mild MR, moderate LAE, mild RAE, moderate   . Dysrhythmia   . Hematuria bladder tumor ---  followed by Dr Karsten Ro  . Hiatal hernia    repaired in 1992 w/[facility]  . Hx of cardiovascular stress test    Lexiscan Myoview (03/2013):  No ischemia; not gated.  . Hyperglycemia diet controlled  . Hyperlipidemia   . Hypertension   . Hypothyroidism   . Obesity   . Organic impotence   . S/P AAA repair using bifurcation graft   . Sleep apnea no rx cpap   mild- tested  2012  . Ventricular ectopy    mild   Surgical Hx: The patient  has a past surgical history that includes Cardiac catheterization (03-1990); ETT (10/31/00); Hospital - Afib (03/28/02); Endovascular  stent insertion (02/15/2011); Cardiovascular stress test (02-01-2011  NUCLEAR NO EXERCISE); Transurethral resection of bladder tumor (03/25/2011); Total ankle replacement (Right, 04/17/2014); Inguinal hernia repair (Left, 1997); Coronary artery bypass graft (05/1990); Abdominal aortic aneurysm repair (2012); Hiatal hernia repair (1992); Total ankle arthroplasty (Right, 04/17/2014); and Joint replacement (Right, 04-17-14).   Current Medications: Current Meds  Medication Sig  . apixaban (ELIQUIS) 5 MG TABS tablet Take 1 tablet (5 mg total) by mouth 2 (two) times daily.  Marland Kitchen atorvastatin (LIPITOR) 40 MG tablet Take 1 tablet (40 mg total) by  mouth daily.  . clotrimazole (LOTRIMIN) 1 % cream Apply 1 application topically as needed (SKIN IRRITATION).  Marland Kitchen levothyroxine (SYNTHROID, LEVOTHROID) 137 MCG tablet Take 1 tablet (137 mcg total) by mouth daily before breakfast.  . ramipril (ALTACE) 5 MG capsule TAKE 1 CAPSULE (5 MG TOTAL) BY MOUTH 2 (TWO) TIMES DAILY.  . sildenafil (REVATIO) 20 MG tablet TAKE 3-5 TABLETS BY MOUTH DAILY AS NEEDED.     Allergies:   Dabigatran etexilate mesylate and Xarelto [rivaroxaban]   Social History   Tobacco Use  . Smoking status: Former Smoker    Packs/day: 1.25    Years: 30.00    Pack years: 37.50    Types: Cigarettes    Last attempt to quit: 02/28/1990    Years since quitting: 28.1  . Smokeless tobacco: Never Used  Substance Use Topics  . Alcohol use: No    Alcohol/week: 0.0 standard drinks    Frequency: Never    Comment: basically no alcohol use.  only very rare alcohol use.   . Drug use: No     Family Hx: The patient's family history includes Alcohol abuse (age of onset: 58) in his sister; Diabetes in his mother; Heart disease in his mother; Hyperlipidemia in his mother; Hypertension in his mother; Obesity (age of onset: 86) in his mother; Other in his father. There is no history of Colon cancer, Prostate cancer, or Anesthesia problems.  ROS:   Please see the history of present illness.    Review of Systems  Genitourinary: Positive for incomplete emptying.   All other systems reviewed and are negative.   EKGs/Labs/Other Test Reviewed:    EKG:  EKG is  ordered today.  The ekg ordered today demonstrates atrial fibrillation, HR 75, LAD, PVC, QTc 462  Recent Labs: No results found for requested labs within last 8760 hours.   Recent Lipid Panel Lab Results  Component Value Date/Time   CHOL 168 03/21/2017 10:31 AM   TRIG 254.0 (H) 03/21/2017 10:31 AM   HDL 37.10 (L) 03/21/2017 10:31 AM   CHOLHDL 5 03/21/2017 10:31 AM   LDLCALC 79 11/23/2015 08:12 AM   LDLDIRECT 99.0 03/21/2017  10:31 AM   From [facility] Tool Cholesterol, total 168.000 03/21/2017 HDL 37.100 03/21/2017 LDL 99.000 03/21/2017 Triglycerides 254.000 03/21/2017 A1C 5.800 03/21/2017 Hemoglobin 15.400 03/21/2017 Creatinine, Serum 0.920 03/21/2017 Potassium 5.000 03/21/2017 ALT (SGPT) 11.000 03/21/2017 TSH 4.530 03/21/2017 Platelets 228.000 03/21/2017   Physical Exam:    VS:  BP 140/80   Pulse 75   Ht 5\' 11"  (1.803 m)   Wt 256 lb 12.8 oz (116.5 kg)   SpO2 97%   BMI 35.82 kg/m     Wt Readings from Last 3 Encounters:  04/10/18 256 lb 12.8 oz (116.5 kg)  04/07/17 255 lb 12.8 oz (116 kg)  03/21/17 256 lb 4 oz (116.2 kg)     Physical Exam  Constitutional: He is oriented to person, place, and time. He appears well-developed  and well-nourished. No distress.  HENT:  Head: Normocephalic and atraumatic.  Eyes: No scleral icterus.  Neck: No JVD present. Carotid bruit is not present. No thyromegaly present.  Cardiovascular: Normal rate. An irregularly irregular rhythm present.  No murmur heard. Pulmonary/Chest: Effort normal. He has no rales.  Abdominal: Soft. He exhibits no distension.  Musculoskeletal:        General: Edema (1+ R ankle edema; trace LLE edema) present.  Lymphadenopathy:    He has no cervical adenopathy.  Neurological: He is alert and oriented to person, place, and time.  Skin: Skin is warm and dry.  Psychiatric: He has a normal mood and affect.    ASSESSMENT & PLAN:    Coronary artery disease involving native coronary artery of native heart without angina pectoris  Hx of CABG in 1992 with Dr. Redmond Pulling.  Myoview in 2015 was low risk.  He is not having any anginal symptoms.  He is not on ASA as he is on Apixaban. Continue statin.  I will follow him along with Dr. Burt Knack going forward.    Permanent atrial fibrillation (HCC)  Rate controlled.  Continue Apixaban.  Obtain CMET, CBC today.  Chronic systolic CHF (congestive heart failure) (HCC)  EF 40-45.  NYHA 2.  Volume status is stable.   Continue beta-blocker, ACE inhibitor.    Essential hypertension He has higher BPs in clinic.  He has not been checking his BP at home lately.  He has to hold his Toprol at times due to diarrhea.  I have asked him to try to take his Toprol with food.  I have also asked him to monitor his BP at home and call if it is running above target.    Pure hypercholesterolemia  Obtain follow up CMET, Lipids today. Continue Atorvastatin.   S/P AAA repair  Continue follow up with vascular surgery as planned.    Dispo:  Return in about 1 year (around 04/11/2019) for Routine Follow Up, w/ Dr. Burt Knack, or Richardson Dopp, PA-C.   Medication Adjustments/Labs and Tests Ordered: Current medicines are reviewed at length with the patient today.  Concerns regarding medicines are outlined above.  Tests Ordered: Orders Placed This Encounter  Procedures  . Comprehensive metabolic panel  . CBC  . Lipid panel  . EKG 12-Lead   Medication Changes: No orders of the defined types were placed in this encounter.   Signed, Richardson Dopp, PA-C  04/10/2018 9:22 AM    [facility] [address], [place], [place]  [address] Phone: [phone]; Fax: [phone]

## 2018-04-10 ENCOUNTER — Encounter (INDEPENDENT_AMBULATORY_CARE_PROVIDER_SITE_OTHER): Payer: Self-pay

## 2018-04-10 ENCOUNTER — Ambulatory Visit (INDEPENDENT_AMBULATORY_CARE_PROVIDER_SITE_OTHER): Payer: Medicare Other | Admitting: Physician Assistant

## 2018-04-10 ENCOUNTER — Encounter: Payer: Self-pay | Admitting: Physician Assistant

## 2018-04-10 VITALS — BP 140/80 | HR 75 | Ht 71.0 in | Wt 256.8 lb

## 2018-04-10 DIAGNOSIS — I4821 Permanent atrial fibrillation: Secondary | ICD-10-CM

## 2018-04-10 DIAGNOSIS — I5022 Chronic systolic (congestive) heart failure: Secondary | ICD-10-CM

## 2018-04-10 DIAGNOSIS — I2510 Atherosclerotic heart disease of native coronary artery without angina pectoris: Secondary | ICD-10-CM | POA: Diagnosis not present

## 2018-04-10 DIAGNOSIS — E7800 Pure hypercholesterolemia, unspecified: Secondary | ICD-10-CM

## 2018-04-10 DIAGNOSIS — I10 Essential (primary) hypertension: Secondary | ICD-10-CM | POA: Diagnosis not present

## 2018-04-10 DIAGNOSIS — Z9889 Other specified postprocedural states: Secondary | ICD-10-CM

## 2018-04-10 DIAGNOSIS — Z8679 Personal history of other diseases of the circulatory system: Secondary | ICD-10-CM | POA: Diagnosis not present

## 2018-04-10 NOTE — Patient Instructions (Signed)
Medication Instructions:  Your physician recommends that you continue on your current medications as directed. Please refer to the Current Medication list given to you today.   * TRY TAKING TOPROL WITH YOUR LARGEST EVENING MEAL*  If you need a refill on your cardiac medications before your next appointment, please call your pharmacy.   Lab work: TODAY: CMET, CBC, LIPIDS  If you have labs (blood work) drawn today and your tests are completely normal, you will receive your results only by: Marland Kitchen MyChart Message (if you have MyChart) OR . A paper copy in the mail If you have any lab test that is abnormal or we need to change your treatment, we will call you to review the results.  Testing/Procedures: NONE  Follow-Up: At [facility], you and your health needs are our priority.  As part of our continuing mission to provide you with exceptional heart care, we have created designated Provider Care Teams.  These Care Teams include your primary Cardiologist (physician) and Advanced Practice Providers (APPs -  Physician Assistants and Nurse Practitioners) who all work together to provide you with the care you need, when you need it. . You will need a follow up appointment in:  12 months.  Please call our office 2 months in advance to schedule this appointment.  You may see DR. Burt Knack or Richardson Dopp, PA-C  Any Other Special Instructions Will Be Listed Below (If Applicable). Please check your blood pressure at home and call our office if your blood pressure is consistently running greater than 130/80.

## 2018-04-12 LAB — COMPREHENSIVE METABOLIC PANEL
A/G RATIO: 1.5 (ref 1.2–2.2)
ALK PHOS: 92 IU/L (ref 39–117)
ALT: 15 IU/L (ref 0–44)
AST: 18 IU/L (ref 0–40)
Albumin: 4.3 g/dL (ref 3.6–4.6)
BILIRUBIN TOTAL: 0.7 mg/dL (ref 0.0–1.2)
BUN/Creatinine Ratio: 9 — ABNORMAL LOW (ref 10–24)
BUN: 10 mg/dL (ref 8–27)
CHLORIDE: 101 mmol/L (ref 96–106)
CO2: 22 mmol/L (ref 20–29)
Calcium: 9.6 mg/dL (ref 8.6–10.2)
Creatinine, Ser: 1.08 mg/dL (ref 0.76–1.27)
GFR calc non Af Amer: 64 mL/min/{1.73_m2} (ref >59)
GFR, EST AFRICAN AMERICAN: 74 mL/min/{1.73_m2} (ref >59)
GLUCOSE: 102 mg/dL — AB (ref 65–99)
Globulin, Total: 2.9 g/dL (ref 1.5–4.5)
POTASSIUM: 4.6 mmol/L (ref 3.5–5.2)
Sodium: 139 mmol/L (ref 134–144)
Total Protein: 7.2 g/dL (ref 6.0–8.5)

## 2018-04-12 LAB — CBC
Hematocrit: 45.2 % (ref 37.5–51.0)
Hemoglobin: 15.3 g/dL (ref 13.0–17.7)
MCH: 29.9 pg (ref 26.6–33.0)
MCHC: 33.8 g/dL (ref 31.5–35.7)
MCV: 89 fL (ref 79–97)
PLATELETS: 252 10*3/uL (ref 150–450)
RBC: 5.11 x10E6/uL (ref 4.14–5.80)
RDW: 13.2 % (ref 11.6–15.4)
WBC: 6.3 10*3/uL (ref 3.4–10.8)

## 2018-04-12 LAB — LIPID PANEL
CHOLESTEROL TOTAL: 156 mg/dL (ref 100–199)
Chol/HDL Ratio: 4 ratio (ref 0.0–5.0)
HDL: 39 mg/dL — ABNORMAL LOW (ref >39)
LDL Calculated: 80 mg/dL (ref 0–99)
Triglycerides: 186 mg/dL — ABNORMAL HIGH (ref 0–149)
VLDL Cholesterol Cal: 37 mg/dL (ref 5–40)

## 2018-04-16 DIAGNOSIS — Z961 Presence of intraocular lens: Secondary | ICD-10-CM | POA: Diagnosis not present

## 2018-04-16 DIAGNOSIS — H43813 Vitreous degeneration, bilateral: Secondary | ICD-10-CM | POA: Diagnosis not present

## 2018-04-16 DIAGNOSIS — H02834 Dermatochalasis of left upper eyelid: Secondary | ICD-10-CM | POA: Diagnosis not present

## 2018-04-16 DIAGNOSIS — H02831 Dermatochalasis of right upper eyelid: Secondary | ICD-10-CM | POA: Diagnosis not present

## 2018-04-25 ENCOUNTER — Other Ambulatory Visit: Payer: Self-pay | Admitting: Family Medicine

## 2018-04-25 NOTE — Telephone Encounter (Signed)
Please call patient and schedule CPE. Send back for refill after appointment is scheduled. 

## 2018-04-25 NOTE — Telephone Encounter (Signed)
Called pt and left message for him to call us back to schedule CPE with Dr. Damita Dunnings.

## 2018-04-26 NOTE — Telephone Encounter (Signed)
Talked Lindell Noe she will see patient @ 9:30am on 05/15/18 for AMV + labs and then he will see Dr. Damita Dunnings @ 10:30am.

## 2018-04-26 NOTE — Telephone Encounter (Signed)
Pt is scheduled for 05/15/18 @ 10:30. Pt wants labs done on same day. There were no openings to schedule an AMV so I mailed the paperwork for pt to complete before appt.

## 2018-05-15 ENCOUNTER — Encounter: Payer: Medicare Other | Admitting: Family Medicine

## 2018-05-15 ENCOUNTER — Ambulatory Visit: Payer: Medicare Other

## 2018-05-17 ENCOUNTER — Other Ambulatory Visit: Payer: Self-pay | Admitting: Family Medicine

## 2018-06-01 ENCOUNTER — Telehealth: Payer: Self-pay | Admitting: Physician Assistant

## 2018-06-01 MED ORDER — APIXABAN 5 MG PO TABS: 5.0000 mg | ORAL_TABLET | Freq: Two times a day (BID) | ORAL | 10 refills | Status: DC

## 2018-06-01 NOTE — Telephone Encounter (Signed)
New message   Pt c/o medication issue:  1. Name of Medication: apixaban (ELIQUIS) 5 MG TABS tablet  2. How are you currently taking this medication (dosage and times per day)? 1 tablet by mouth twice daily  3. Are you having a reaction (difficulty breathing--STAT)? No   4. What is your medication issue? Patient states that he needs to get a prescription sent over to [place], [place]

## 2018-06-01 NOTE — Telephone Encounter (Signed)
Pt last saw Richardson Dopp, [place] on 04/10/18, last labs 04/10/18 Creat 1.08, age 85, weight116.5kg, based on specified criteria pt is on appropriate dosage of Eliquis 5mg  BID.  Will refill rx.

## 2018-07-13 ENCOUNTER — Telehealth: Payer: Self-pay | Admitting: Family Medicine

## 2018-07-13 ENCOUNTER — Other Ambulatory Visit: Payer: Self-pay | Admitting: Family Medicine

## 2018-07-13 DIAGNOSIS — E039 Hypothyroidism, unspecified: Secondary | ICD-10-CM

## 2018-07-13 NOTE — Telephone Encounter (Signed)
error 

## 2018-07-16 ENCOUNTER — Other Ambulatory Visit: Payer: Self-pay

## 2018-07-16 ENCOUNTER — Other Ambulatory Visit (INDEPENDENT_AMBULATORY_CARE_PROVIDER_SITE_OTHER): Payer: Medicare Other

## 2018-07-16 ENCOUNTER — Ambulatory Visit (INDEPENDENT_AMBULATORY_CARE_PROVIDER_SITE_OTHER): Payer: Medicare Other

## 2018-07-16 DIAGNOSIS — Z0000 Encounter for general adult medical examination without abnormal findings: Secondary | ICD-10-CM

## 2018-07-16 DIAGNOSIS — E039 Hypothyroidism, unspecified: Secondary | ICD-10-CM | POA: Diagnosis not present

## 2018-07-16 LAB — TSH: TSH: 2.97 u[IU]/mL (ref 0.35–4.50)

## 2018-07-16 NOTE — Patient Instructions (Signed)
[name] , Thank you for taking time to come for your Medicare Wellness Visit. I appreciate your ongoing commitment to your health goals. Please review the following plan we discussed and let me know if I can assist you in the future.   These are the goals we discussed: Goals    . Patient Stated     Starting 07/16/18, I will continue to take medications as prescribed.        This is a list of the screening recommended for you and due dates:  Health Maintenance  Topic Date Due  . Tetanus Vaccine  02/28/2020*  . Flu Shot  09/29/2018  . Pneumonia vaccines  Completed  *Topic was postponed. The date shown is not the original due date.   Preventive Care for Adults  A healthy lifestyle and preventive care can promote health and wellness. Preventive health guidelines for adults include the following key practices.  . A routine yearly physical is a good way to check with your health care provider about your health and preventive screening. It is a chance to share any concerns and updates on your health and to receive a thorough exam.  . Visit your dentist for a routine exam and preventive care every 6 months. Brush your teeth twice a day and floss once a day. Good oral hygiene prevents tooth decay and gum disease.  . The frequency of eye exams is based on your age, health, family medical history, use  of contact lenses, and other factors. Follow your health care provider's recommendations for frequency of eye exams.  . Eat a healthy diet. Foods like vegetables, fruits, whole grains, low-fat dairy products, and lean protein foods contain the nutrients you need without too many calories. Decrease your intake of foods high in solid fats, added sugars, and salt. Eat the right amount of calories for you. Get information about a proper diet from your health care provider, if necessary.  . Regular physical exercise is one of the most important things you can do for your health. Most adults should  get at least 150 minutes of moderate-intensity exercise (any activity that increases your heart rate and causes you to sweat) each week. In addition, most adults need muscle-strengthening exercises on 2 or more days a week.  Silver Sneakers may be a benefit available to you. To determine eligibility, you may visit the website: www.silversneakers.com or contact program at [phone] Mon-Fri between 8AM-8PM.   . Maintain a healthy weight. The body mass index (BMI) is a screening tool to identify possible weight problems. It provides an estimate of body fat based on height and weight. Your health care provider can find your BMI and can help you achieve or maintain a healthy weight.   For adults 20 years and older: ? A BMI below 18.5 is considered underweight. ? A BMI of 18.5 to 24.9 is normal. ? A BMI of 25 to 29.9 is considered overweight. ? A BMI of 30 and above is considered obese.   . Maintain normal blood lipids and cholesterol levels by exercising and minimizing your intake of saturated fat. Eat a balanced diet with plenty of fruit and vegetables. Blood tests for lipids and cholesterol should begin at age 46 and be repeated every 5 years. If your lipid or cholesterol levels are high, you are over 50, or you are at high risk for heart disease, you may need your cholesterol levels checked more frequently. Ongoing high lipid and cholesterol levels should be treated with medicines  if diet and exercise are not working.  . If you smoke, find out from your health care provider how to quit. If you do not use tobacco, please do not start.  . If you choose to drink alcohol, please do not consume more than 2 drinks per day. One drink is considered to be 12 ounces (355 mL) of beer, 5 ounces (148 mL) of wine, or 1.5 ounces (44 mL) of liquor.  . If you are 78-66 years old, ask your health care provider if you should take aspirin to prevent strokes.  . Use sunscreen. Apply sunscreen liberally and  repeatedly throughout the day. You should seek shade when your shadow is shorter than you. Protect yourself by wearing long sleeves, pants, a wide-brimmed hat, and sunglasses year round, whenever you are outdoors.  . Once a month, do a whole body skin exam, using a mirror to look at the skin on your back. Tell your health care provider of new moles, moles that have irregular borders, moles that are larger than a pencil eraser, or moles that have changed in shape or color.

## 2018-07-16 NOTE — Progress Notes (Signed)
Subjective:   [name] is a 85 y.o. male who presents for Medicare Annual/Subsequent preventive examination.  Review of Systems:  N/A Cardiac Risk Factors include: advanced age (>58men, >38 women);obesity (BMI >30kg/m2);male gender;hypertension     Objective:    Vitals: There were no vitals taken for this visit.  There is no height or weight on file to calculate BMI.  Advanced Directives 07/16/2018 12/16/2016 11/30/2015 05/16/2014 04/17/2014 04/07/2014 03/23/2011  Does Patient Have a Medical Advance Directive? Yes Yes Yes Yes No No Patient has advance directive, copy not in chart  Type of Advance Directive [place];Living will Living will [place];Living will Living will;Healthcare Power of Attorney - - -  Does patient want to make changes to medical advance directive? - - No - Patient declined No - Patient declined - - -  Copy of [place] in Chart? No - copy requested - No - copy requested No - copy requested - - -  Would patient like information on creating a medical advance directive? - - - - Yes - Educational materials given No - patient declined information -  Pre-existing out of facility DNR order (yellow form or pink MOST form) - - - - - - -    Tobacco Social History   Tobacco Use  Smoking Status Former Smoker   Packs/day: 1.25   Years: 30.00   Pack years: 37.50   Types: Cigarettes   Last attempt to quit: 02/28/1990   Years since quitting: 28.3  Smokeless Tobacco Never Used     Counseling given: No   Clinical Intake:  Pre-visit preparation completed: Yes  Pain : No/denies pain Pain Score: 0-No pain     Nutritional Status: BMI > 30  Obese Nutritional Risks: None Diabetes: No  How often do you need to have someone help you when you read instructions, pamphlets, or other written materials from your doctor or pharmacy?: 1 - Never  Interpreter Needed?: No  Information entered by :: LPinson,  LPN  Past Medical History:  Diagnosis Date   AAA (abdominal aortic aneurysm) ([facility])    s/p stent graft repair 2012   Arthritis    "right ankle" (04/17/2014)   Atrial fibrillation ([place])    a. permanent;  b. Hematuria on Pradaxa;  c. cold intol and chills with Xarelto   Bladder cancer ([facility])    Cardiomyopathy ([place])    a. Echo (03/2013): Inferior and inferolateral AK, EF 40-45%, mild MR, mod LAE, mod RAE, mod TR, mild PI, PASP 46   Chronic kidney disease    COPD (chronic obstructive pulmonary disease) ([facility])    Coronary artery disease s/p cabg x7  1992   a. s/p CABG in 1992 (X 7 - Dr. Redmond Pulling);  b.  Myoview (01/2011): Fixed inferior defect suggestive of prior MI versus diaphragmatic attenuation, apical reversibility possibly suspicious for small area of infarction with peri-infarct ischemia, no significant change from prior study, not gated, low risk study;  c.  Echo (05/2010): Mod LVH, EF 55-65%, mild AI, mild MR, moderate LAE, mild RAE, moderate    Dysrhythmia    Hematuria bladder tumor ---  followed by Dr Karsten Ro   Hiatal hernia    repaired in 1992 w/OHS   Hx of cardiovascular stress test    Lexiscan Myoview (03/2013):  No ischemia; not gated.   Hyperglycemia diet controlled   Hyperlipidemia    Hypertension    Hypothyroidism    Obesity    Organic impotence  S/P AAA repair using bifurcation graft    Sleep apnea no rx cpap   mild- tested  2012   Ventricular ectopy    mild   Past Surgical History:  Procedure Laterality Date   ABDOMINAL AORTIC ANEURYSM REPAIR  2012   using bifurcation graft   CARDIAC CATHETERIZATION  03-1990   Positive   CARDIOVASCULAR STRESS TEST  02-01-2011  NUCLEAR NO EXERCISE   LOW RISK STUDY.  SMALL APICAL DEFECT. NO EVIDENCE OF MULTIZONE ISCHEMIA.   CORONARY ARTERY BYPASS GRAFT  05/1990   CABG X 7   ENDOVASCULAR STENT INSERTION  02/15/2011   Procedure: ENDOVASCULAR STENT GRAFT INSERTION;  Surgeon: Hinda Lenis, MD;  Location:  [facility];  Service: Vascular;  Laterality: N/A;  Insertion of endovascular stent graft    ETT  10/31/00   WNL   HIATAL HERNIA REPAIR  1992   "repaired when I had my OHS"   Hospital - Afib  03/28/02   INGUINAL HERNIA REPAIR Left 1997   JOINT REPLACEMENT Right 04-17-14   Ankle   TOTAL ANKLE ARTHROPLASTY Right 04/17/2014   Procedure: RIGHT TOTAL ANKLE ARTHOPLASTY;  Surgeon: Wylene Simmer, MD;  Location: [facility];  Service: Orthopedics;  Laterality: Right;   TOTAL ANKLE REPLACEMENT Right 04/17/2014   TRANSURETHRAL RESECTION OF BLADDER TUMOR  03/25/2011   Procedure: TRANSURETHRAL RESECTION OF BLADDER TUMOR (TURBT);  Surgeon: Claybon Jabs, MD;  Location: [facility];  Service: Urology;  Laterality: N/A;   Family History  Problem Relation Age of Onset   Obesity Mother 62       deceased   Hypertension Mother    Diabetes Mother    Heart disease Mother        before age 53   Hyperlipidemia Mother    Alcohol abuse Sister 59       deceased   Other Father        deceased unknown   Colon cancer Neg Hx    Prostate cancer Neg Hx    Anesthesia problems Neg Hx    Social History   Socioeconomic History   Marital status: Widowed    Spouse name: Not on file   Number of children: Not on file   Years of education: Not on file   Highest education level: Not on file  Occupational History   Occupation: retired Pension scheme manager.    Employer: retired    Comment: 2001. Likes to play golf . Travels with motor home  Social Needs   Financial resource strain: Not on file   Food insecurity:    Worry: Not on file    Inability: Not on file   Transportation needs:    Medical: Not on file    Non-medical: Not on file  Tobacco Use   Smoking status: Former Smoker    Packs/day: 1.25    Years: 30.00    Pack years: 37.50    Types: Cigarettes    Last attempt to quit: 02/28/1990    Years since quitting: 28.3   Smokeless tobacco: Never Used  Substance and Sexual  Activity   Alcohol use: No    Alcohol/week: 0.0 standard drinks    Frequency: Never    Comment: basically no alcohol use.  only very rare alcohol use.    Drug use: No   Sexual activity: Yes  Lifestyle   Physical activity:    Days per week: Not on file    Minutes per session: Not on file   Stress: Not  on file  Relationships   Social connections:    Talks on phone: Not on file    Gets together: Not on file    Attends religious service: Not on file    Active member of club or organization: Not on file    Attends meetings of clubs or organizations: Not on file    Relationship status: Not on file  Other Topics Concern   Not on file  Social History Narrative   From [place]   Divorced, remarried then widowed, 3 children (from 1st marriage)   Likes to golf   Prev 4 sport athlete in [place], football fan.    He goes to the [employer]    Outpatient Encounter Medications as of 07/16/2018  Medication Sig   apixaban (ELIQUIS) 5 MG TABS tablet Take 1 tablet (5 mg total) by mouth 2 (two) times daily.   atorvastatin (LIPITOR) 40 MG tablet TAKE 1 TABLET BY MOUTH ONCE A DAY   clotrimazole (LOTRIMIN) 1 % cream Apply 1 application topically as needed (SKIN IRRITATION).   levothyroxine (SYNTHROID, LEVOTHROID) 137 MCG tablet TAKE 1 TABLET BY MOUTH ONCE A DAY BEFOREBREAKFAST.   ramipril (ALTACE) 5 MG capsule TAKE 1 CAPSULE BY MOUTH TWICE DAILY   sildenafil (REVATIO) 20 MG tablet TAKE 3-5 TABLETS BY MOUTH DAILY AS NEEDED.   metoprolol succinate (TOPROL-XL) 50 MG 24 hr tablet Take 1 tablet (50 mg total) by mouth daily. Take with or immediately following a meal.   No facility-administered encounter medications on file as of 07/16/2018.     Activities of Daily Living In your present state of health, do you have any difficulty performing the following activities: 07/16/2018  Hearing? N  Vision? N  Difficulty concentrating or making decisions? N  Walking or climbing stairs? N  Dressing or  bathing? N  Doing errands, shopping? N  Preparing Food and eating ? N  Using the Toilet? N  In the past six months, have you accidently leaked urine? N  Do you have problems with loss of bowel control? N  Managing your Medications? N  Managing your Finances? N  Housekeeping or managing your Housekeeping? N  Some recent data might be hidden    Patient Care Team: Tonia Ghent, MD as PCP - General (Family Medicine) Sherren Mocha, MD as PCP - Cardiology (Cardiology) Kathie Rhodes, MD as Attending Physician (Urology) Larey Dresser, MD as Consulting Physician (Cardiology)   Assessment:   This is a routine wellness examination for [place].  Vision Screening Comments: Vision exam in 2019  Exercise Activities and Dietary recommendations Current Exercise Habits: The patient does not participate in regular exercise at present, Exercise limited by: None identified  Goals     Patient Stated     Starting 07/16/18, I will continue to take medications as prescribed.        Fall Risk Fall Risk  07/16/2018 03/21/2017 11/30/2015 11/26/2014  Falls in the past year? 0 No No No   Depression Screen PHQ 2/9 Scores 07/16/2018 03/21/2017 11/30/2015 11/26/2014  PHQ - 2 Score 0 0 0 0  PHQ- 9 Score 0 - - -    Cognitive Function MMSE - Mini Mental State Exam 07/16/2018 11/30/2015  Orientation to time 5 5  Orientation to Place 5 5  Registration 3 3  Attention/ Calculation 0 0  Recall 3 2  Recall-comments - pt was unable to recall 1 of 3 words  Language- name 2 objects 0 0  Language- repeat 1 1  Language- follow  3 step command 0 3  Language- read & follow direction 0 0  Write a sentence 0 0  Copy design 0 0  Total score 17 19   PLEASE NOTE: A Mini-Cog screen was completed. Maximum score is 17. A value of 0 denotes this part of [place] MMSE was not completed or the patient failed this part of the Mini-Cog screening.   Mini-Cog Screening Orientation to Time - Max 5 pts Orientation to Place  - Max 5 pts Registration - Max 3 pts Recall - Max 3 pts Language Repeat - Max 1 pts      Immunization History  Administered Date(s) Administered   Influenza Split 12/28/2010, 12/29/2011   Influenza Whole 01/09/2006, 12/18/2008, 10/27/2009   Influenza,inj,Quad PF,6+ Mos 12/11/2012, 11/22/2013, 11/26/2014, 11/30/2015   Influenza-Unspecified 01/28/2017, 11/28/2017   Pneumococcal Conjugate-13 11/26/2014   Pneumococcal Polysaccharide-23 03/01/1999, 12/28/2010   Td 11/28/1996, 09/27/2006   Zoster 04/29/2010    Screening Tests Health Maintenance  Topic Date Due   TETANUS/TDAP  02/28/2020 (Originally 09/26/2016)   INFLUENZA VACCINE  09/29/2018   PNA vac Low Risk Adult  Completed     Plan:   I have personally reviewed, addressed, and noted the following in the patients chart:  A. Medical and social history B. Use of alcohol, tobacco or illicit drugs  C. Current medications and supplements D. Functional ability and status E.  Nutritional status F.  Physical activity G. Advance directives H. List of other physicians I.  Hospitalizations, surgeries, and ER visits in previous 12 months J.  Vitals (unless it is a telemedicine encounter) K. Screenings to include cognitive, depression, hearing, vision (NOTE: hearing and vision screenings not completed in telemedicine encounter) L. Referrals and appointments   In addition, I have reviewed and discussed with patient certain preventive protocols, quality metrics, and best practice recommendations. A written personalized care plan for preventive services and recommendations were provided to patient.  With patient's permission, we connected on 07/16/18 at 11:00 AM EDT by a video enabled telemedicine application. Two patient identifiers were used to ensure the encounter occurred with the correct person.    Patient was in home and writer was in office.   Signed,   Lindell Noe, MHA, BS, LPN Health Coach

## 2018-07-16 NOTE — Progress Notes (Signed)
PCP notes:   Health maintenance:  Tetanus vaccine - postponed/insurance  Abnormal screenings:   None  Patient concerns:   None  Nurse concerns:  None  Next PCP appt:   07/17/2018 @ 0017  I reviewed health advisor's note, was available for consultation on the day of service listed in this note, and agree with documentation and plan. Elsie Stain, MD.

## 2018-07-17 ENCOUNTER — Ambulatory Visit (INDEPENDENT_AMBULATORY_CARE_PROVIDER_SITE_OTHER): Payer: Medicare Other | Admitting: Family Medicine

## 2018-07-17 ENCOUNTER — Encounter: Payer: Self-pay | Admitting: Family Medicine

## 2018-07-17 VITALS — Temp 98.6°F | Wt 252.0 lb

## 2018-07-17 DIAGNOSIS — E7800 Pure hypercholesterolemia, unspecified: Secondary | ICD-10-CM

## 2018-07-17 DIAGNOSIS — I4891 Unspecified atrial fibrillation: Secondary | ICD-10-CM

## 2018-07-17 DIAGNOSIS — Z0000 Encounter for general adult medical examination without abnormal findings: Secondary | ICD-10-CM

## 2018-07-17 DIAGNOSIS — I10 Essential (primary) hypertension: Secondary | ICD-10-CM

## 2018-07-17 DIAGNOSIS — I714 Abdominal aortic aneurysm, without rupture, unspecified: Secondary | ICD-10-CM

## 2018-07-17 DIAGNOSIS — E039 Hypothyroidism, unspecified: Secondary | ICD-10-CM | POA: Diagnosis not present

## 2018-07-17 DIAGNOSIS — Z7189 Other specified counseling: Secondary | ICD-10-CM

## 2018-07-17 DIAGNOSIS — C679 Malignant neoplasm of bladder, unspecified: Secondary | ICD-10-CM | POA: Diagnosis not present

## 2018-07-17 MED ORDER — LEVOTHYROXINE SODIUM 137 MCG PO TABS: ORAL_TABLET | ORAL | 3 refills | Status: DC

## 2018-07-17 MED ORDER — METOPROLOL SUCCINATE ER 25 MG PO TB24: 25.0000 mg | ORAL_TABLET | Freq: Every day | ORAL | 3 refills | Status: DC

## 2018-07-17 MED ORDER — RAMIPRIL 5 MG PO CAPS: ORAL_CAPSULE | ORAL | 3 refills | Status: DC

## 2018-07-17 MED ORDER — METOPROLOL SUCCINATE ER 50 MG PO TB24: 50.0000 mg | ORAL_TABLET | ORAL | Status: DC

## 2018-07-17 MED ORDER — ATORVASTATIN CALCIUM 40 MG PO TABS: 40.0000 mg | ORAL_TABLET | Freq: Every day | ORAL | 3 refills | Status: DC

## 2018-07-17 MED ORDER — METOPROLOL SUCCINATE ER 50 MG PO TB24
50.0000 mg | ORAL_TABLET | Freq: Every day | ORAL | Status: DC
Start: 2018-07-17 — End: 2018-07-17

## 2018-07-17 NOTE — Progress Notes (Signed)
Interactive audio and video telecommunications were attempted between this provider and patient, however failed, due to patient having technical difficulties OR patient did not have access to video capability.  We continued and completed visit with audio only.   Virtual Visit via Telephone Note  I connected with patient on 07/17/18 at 3:40 PM by telephone and verified that I am speaking with the correct person using two identifiers.  Location of patient: home.   Location of MD: [facility] Johnny of referring provider (if blank then none associated): Names per persons and role in encounter:  MD: Earlyne Iba, Patient: [name].    I discussed the limitations, risks, security and privacy concerns of performing an evaluation and management service by telephone and the availability of in person appointments. I also discussed with the patient that there may be a patient responsible charge related to this service. The patient expressed understanding and agreed to proceed.  CC: f/u  HPI  Pandemic considerations d/w pt.   Flu 2019 Shingles 2012 PNA UTD Tetanus 2008 Colonoscopy not due given his age.   Prostate cancer screening and PSA options (with potential risks and benefits of testing vs not testing) were discussed along with recent recs/guidelines.  He declined testing PSA at this point. Advance directive- both daughters and son designated if patient were incapacitated.  H/o bladder cancer with routine f/u with urology.  I'll defer.  No blood in urine.    AF on anticoagulation.  He felt better taking metoprolol every other day and he is now taking it with food.  He isn't have pulse racing.  BP had been controlled with pulse in the 70s.  No bleeding on eliquis.    AAA with prev repair done.  He was not sure about when he was supposed to follow-up with vascular.  I sent a note to Dr. Lianne Moris clinic about that.    Hypertension:  Using medication without problems or  lightheadedness: yes Chest pain with exertion:no Edema:no Short of breath:no  Hypothyroidism. TSH wnl.  Compliant. No ADE on med. No neck mass.    Elevated Cholesterol: Using medications without problems: yes Muscle aches: no Diet compliance:yes Exercise:yes  Past medical history, social history, family history reviewed with patient.  Observations/Objective: nad Speech wnl.   Assessment and Plan:  Health maintenance Flu 2019 Shingles 2012 PNA UTD Tetanus 2008 Colonoscopy not due given his age.   Prostate cancer screening and PSA options (with potential risks and benefits of testing vs not testing) were discussed along with recent recs/guidelines.  He declined testing PSA at this point. Advance directive- both daughters and son designated if patient were incapacitated.  H/o bladder cancer with routine f/u with urology.  I'll defer.  No blood in urine.    AF on anticoagulation.  No adverse effect on anticoagulation.  Discussed with patient about changing metoprolol to 25 mg daily instead of 50 mg every other day.  He agreed.  Prescription sent.  AAA with prev repair done.  He was not sure about when he was supposed to follow-up with vascular.  I sent a note to Dr. Lianne Moris clinic about that, for input.  App help of all involved.   Hypertension:  No change in medication other than changing metoprolol to 25 mg daily.  This may be tolerable for patient.  He agreed.  Hypothyroidism. TSH wnl.  Compliant. No ADE on med. No neck mass.   Continue as is.  He agrees.  Elevated Cholesterol: Continue statin.  Continue work on diet and exercise.  He agrees.  Follow Up Instructions:see Galvan.    I discussed the assessment and treatment plan with the patient. The patient was provided an opportunity to ask questions and all were answered. The patient agreed with the plan and demonstrated an understanding of the instructions.   The patient was advised to call back or seek an in-person  evaluation if the symptoms worsen or if the condition fails to improve as anticipated.  I provided 25 minutes of non-face-to-face time during this encounter.  Elsie Stain, MD

## 2018-07-18 DIAGNOSIS — I4891 Unspecified atrial fibrillation: Secondary | ICD-10-CM | POA: Insufficient documentation

## 2018-07-18 DIAGNOSIS — Z0000 Encounter for general adult medical examination without abnormal findings: Secondary | ICD-10-CM | POA: Insufficient documentation

## 2018-07-18 NOTE — Assessment & Plan Note (Signed)
AAA with prev repair done.  He was not sure about when he was supposed to follow-up with vascular.  I sent a note to Dr. Lianne Moris clinic about that, for input.  App help of all involved.

## 2018-07-18 NOTE — Assessment & Plan Note (Signed)
AF on anticoagulation.  No adverse effect on anticoagulation.  Discussed with patient about changing metoprolol to 25 mg daily instead of 50 mg every other day.  He agreed.  Prescription sent.

## 2018-07-18 NOTE — Assessment & Plan Note (Signed)
TSH wnl.  Compliant. No ADE on med. No neck mass.  Continue as is.  He agrees.

## 2018-07-18 NOTE — Assessment & Plan Note (Signed)
H/o bladder cancer with routine f/u with urology.  I'll defer.  No blood in urine.

## 2018-07-18 NOTE — Assessment & Plan Note (Signed)
Continue statin.  Continue work on diet and exercise.  He agrees. 

## 2018-07-18 NOTE — Assessment & Plan Note (Signed)
Flu 2019 Shingles 2012 PNA UTD Tetanus 2008 Colonoscopy not due given his age.   Prostate cancer screening and PSA options (with potential risks and benefits of testing vs not testing) were discussed along with recent recs/guidelines.  He declined testing PSA at this point. Advance directive- both daughters and son designated if patient were incapacitated.

## 2018-07-18 NOTE — Assessment & Plan Note (Signed)
Advance directive- both daughters and son designated if patient were incapacitated. 

## 2018-07-18 NOTE — Assessment & Plan Note (Signed)
  No change in medication other than changing metoprolol to 25 mg daily.  This may be tolerable for patient.  He agreed.

## 2018-11-08 ENCOUNTER — Telehealth: Payer: Self-pay | Admitting: Family Medicine

## 2018-11-08 NOTE — Telephone Encounter (Signed)
LMOM.  Per Dr. Lianne Moris office,  Pt saw Vinnie Level, NP on 12-16-16. OV states pt doesn't need to be seen unless having problems.  Otherwise, will only need surveillance w/yearly scanning.  Pt overdue for annual scanning and will need to call [phone] to schedule this.

## 2018-11-11 NOTE — Telephone Encounter (Signed)
Thank for for checking on this and trying to contact the patient.

## 2018-11-28 ENCOUNTER — Other Ambulatory Visit: Payer: Self-pay | Admitting: Family Medicine

## 2018-11-28 NOTE — Telephone Encounter (Signed)
Electronic refill request Sildenafil Last office visit 07/17/18 Last refill 10/24/17 #50/12

## 2018-11-28 NOTE — Telephone Encounter (Signed)
Sent. Thanks.   

## 2018-11-29 ENCOUNTER — Ambulatory Visit (INDEPENDENT_AMBULATORY_CARE_PROVIDER_SITE_OTHER): Payer: Medicare Other

## 2018-11-29 DIAGNOSIS — Z23 Encounter for immunization: Secondary | ICD-10-CM

## 2019-04-08 ENCOUNTER — Telehealth: Payer: Self-pay | Admitting: *Deleted

## 2019-04-08 NOTE — Telephone Encounter (Signed)
He should be able to get the vaccine.  Glad he is able to get this.  Thanks.

## 2019-04-08 NOTE — Telephone Encounter (Signed)
Patient left a voicemail stating that he is scheduled to get the covid vaccine Saturday. Patient stated that he was told to check with his doctor to make sure that it is okay for him to take the vaccine with all of the mediations that he is taking.

## 2019-04-08 NOTE — Telephone Encounter (Signed)
Patient advised.

## 2019-04-14 ENCOUNTER — Ambulatory Visit: Payer: Medicare Other

## 2019-05-04 ENCOUNTER — Other Ambulatory Visit: Payer: Self-pay | Admitting: Family Medicine

## 2019-06-04 DIAGNOSIS — H02834 Dermatochalasis of left upper eyelid: Secondary | ICD-10-CM | POA: Diagnosis not present

## 2019-06-04 DIAGNOSIS — Z961 Presence of intraocular lens: Secondary | ICD-10-CM | POA: Diagnosis not present

## 2019-06-04 DIAGNOSIS — H02831 Dermatochalasis of right upper eyelid: Secondary | ICD-10-CM | POA: Diagnosis not present

## 2019-06-04 DIAGNOSIS — H43813 Vitreous degeneration, bilateral: Secondary | ICD-10-CM | POA: Diagnosis not present

## 2019-06-04 DIAGNOSIS — H26493 Other secondary cataract, bilateral: Secondary | ICD-10-CM | POA: Diagnosis not present

## 2019-06-17 ENCOUNTER — Ambulatory Visit: Payer: Medicare Other | Admitting: Cardiovascular Disease

## 2019-06-24 ENCOUNTER — Ambulatory Visit (INDEPENDENT_AMBULATORY_CARE_PROVIDER_SITE_OTHER): Payer: Medicare Other | Admitting: Internal Medicine

## 2019-06-24 ENCOUNTER — Telehealth: Payer: Self-pay

## 2019-06-24 ENCOUNTER — Other Ambulatory Visit: Payer: Self-pay

## 2019-06-24 ENCOUNTER — Encounter: Payer: Self-pay | Admitting: Internal Medicine

## 2019-06-24 DIAGNOSIS — L723 Sebaceous cyst: Secondary | ICD-10-CM | POA: Diagnosis not present

## 2019-06-24 DIAGNOSIS — L089 Local infection of the skin and subcutaneous tissue, unspecified: Secondary | ICD-10-CM | POA: Diagnosis not present

## 2019-06-24 DIAGNOSIS — L03312 Cellulitis of back [any part except buttock]: Secondary | ICD-10-CM

## 2019-06-24 MED ORDER — AMOXICILLIN-POT CLAVULANATE 875-125 MG PO TABS: 1.0000 | ORAL_TABLET | Freq: Two times a day (BID) | ORAL | 0 refills | Status: DC

## 2019-06-24 MED ORDER — CEFTRIAXONE SODIUM 1 G IJ SOLR
1.0000 g | Freq: Once | INTRAMUSCULAR | Status: AC
  Administered 2019-06-24: 17:00:00 1 g via INTRAMUSCULAR

## 2019-06-24 NOTE — Addendum Note (Signed)
Addended by: Pilar Grammes on: 06/24/2019 04:31 PM   Modules accepted: Orders

## 2019-06-24 NOTE — Progress Notes (Signed)
Subjective:    Patient ID: [name], male    DOB: Dec 31, 1933, 86 y.o.   MRN: [id number]  HPI  Here due to worsening lump on his back This visit occurred during the SARS-CoV-2 public health emergency.  Safety protocols were in place, including screening questions prior to the visit, additional usage of staff PPE, and extensive cleaning of exam room while observing appropriate contact time as indicated for disinfecting solutions.   Has had trouble with this in the past 10 days Really bad in the past 4 days Getting bigger and tight--kept him from sleeping 2 nights ago Got very hard It has drained some---pus and blood  Current Outpatient Medications on File Prior to Visit  Medication Sig Dispense Refill  . apixaban (ELIQUIS) 5 MG TABS tablet Take 1 tablet (5 mg total) by mouth 2 (two) times daily. 60 tablet 10  . atorvastatin (LIPITOR) 40 MG tablet Take 1 tablet (40 mg total) by mouth daily. 90 tablet 3  . clotrimazole (LOTRIMIN) 1 % cream Apply 1 application topically as needed (SKIN IRRITATION). 30 g 0  . levothyroxine (SYNTHROID) 137 MCG tablet TAKE 1 TABLET BY MOUTH ONCE DAILY BEFOREBREAKFAST 90 tablet 0  . metoprolol succinate (TOPROL-XL) 25 MG 24 hr tablet Take 1 tablet (25 mg total) by mouth daily. Take with or immediately following a meal. 90 tablet 3  . ramipril (ALTACE) 5 MG capsule TAKE 1 CAPSULE BY MOUTH TWICE DAILY 180 capsule 3  . sildenafil (REVATIO) 20 MG tablet TAKE 3 TO 5 TABLETS BY MOUTH DAILY AS NEEDED 50 tablet 12   No current facility-administered medications on file prior to visit.    Allergies  Allergen Reactions  . Dabigatran Etexilate Mesylate     Blood in urine  . Xarelto [Rivaroxaban] Shortness Of Breath and Other (See Comments)    SOB, CHILLS    Past Medical History:  Diagnosis Date  . AAA (abdominal aortic aneurysm) ([place])    s/p stent graft repair 2012  . Arthritis    "right ankle" (04/17/2014)  . Atrial fibrillation ([place])    a. permanent;   b. Hematuria on Pradaxa;  c. cold intol and chills with Xarelto  . Bladder cancer ([place])   . Cardiomyopathy [facility])    a. Echo (03/2013): Inferior and inferolateral AK, EF 40-45%, mild MR, mod LAE, mod RAE, mod TR, mild PI, PASP 46  . Chronic kidney disease   . COPD (chronic obstructive pulmonary disease) ([place])   . Coronary artery disease s/p cabg x7  1992   a. s/p CABG in 1992 (X 7 - Dr. Redmond Pulling);  b.  Myoview (01/2011): Fixed inferior defect suggestive of prior MI versus diaphragmatic attenuation, apical reversibility possibly suspicious for small area of infarction with peri-infarct ischemia, no significant change from prior study, not gated, low risk study;  c.  Echo (05/2010): Mod LVH, EF 55-65%, mild AI, mild MR, moderate LAE, mild RAE, moderate   . Dysrhythmia   . Hematuria bladder tumor ---  followed by Dr Karsten Ro  . Hiatal hernia    repaired in 1992 w/[facility]  . Hx of cardiovascular stress test    Lexiscan Myoview (03/2013):  No ischemia; not gated.  . Hyperglycemia diet controlled  . Hyperlipidemia   . Hypertension   . Hypothyroidism   . Obesity   . Organic impotence   . S/P AAA repair using bifurcation graft   . Sleep apnea no rx cpap   mild- tested  2012  . Ventricular ectopy  mild    Past Surgical History:  Procedure Laterality Date  . ABDOMINAL AORTIC ANEURYSM REPAIR  2012   using bifurcation graft  . CARDIAC CATHETERIZATION  03-1990   Positive  . CARDIOVASCULAR STRESS TEST  02-01-2011  NUCLEAR NO EXERCISE   LOW RISK STUDY.  SMALL APICAL DEFECT. NO EVIDENCE OF MULTIZONE ISCHEMIA.  Marland Kitchen CORONARY ARTERY BYPASS GRAFT  05/1990   CABG X 7  . ENDOVASCULAR STENT INSERTION  02/15/2011   Procedure: ENDOVASCULAR STENT GRAFT INSERTION;  Surgeon: Hinda Lenis, MD;  Location: [place];  Service: Vascular;  Laterality: N/A;  Insertion of endovascular stent graft   . ETT  10/31/00   WNL  . HIATAL HERNIA REPAIR  1992   "repaired when I had my OHS"  . Hospital - Afib  03/28/02  .  INGUINAL HERNIA REPAIR Left 1997  . JOINT REPLACEMENT Right 04-17-14   Ankle  . TOTAL ANKLE ARTHROPLASTY Right 04/17/2014   Procedure: RIGHT TOTAL ANKLE ARTHOPLASTY;  Surgeon: Wylene Simmer, MD;  Location: [place];  Service: Orthopedics;  Laterality: Right;  . TOTAL ANKLE REPLACEMENT Right 04/17/2014  . TRANSURETHRAL RESECTION OF BLADDER TUMOR  03/25/2011   Procedure: TRANSURETHRAL RESECTION OF BLADDER TUMOR (TURBT);  Surgeon: Claybon Jabs, MD;  Location: [facility];  Service: Urology;  Laterality: N/A;    Family History  Problem Relation Age of Onset  . Obesity Mother 26       deceased  . Hypertension Mother   . Diabetes Mother   . Heart disease Mother        before age 31  . Hyperlipidemia Mother   . Alcohol abuse Sister 55       deceased  . Other Father        deceased unknown  . Colon cancer Neg Hx   . Prostate cancer Neg Hx   . Anesthesia problems Neg Hx     Social History   Socioeconomic History  . Marital status: Widowed    Spouse name: Not on file  . Number of children: Not on file  . Years of education: Not on file  . Highest education level: Not on file  Occupational History  . Occupation: retired Pension scheme manager.    Employer: retired    Comment: 2001. Likes to play golf . Travels with motor home  Tobacco Use  . Smoking status: Former Smoker    Packs/day: 1.25    Years: 30.00    Pack years: 37.50    Types: Cigarettes    Quit date: 02/28/1990    Years since quitting: 29.3  . Smokeless tobacco: Never Used  Substance and Sexual Activity  . Alcohol use: No    Alcohol/week: 0.0 standard drinks    Comment: basically no alcohol use.  only very rare alcohol use.   . Drug use: No  . Sexual activity: Yes  Other Topics Concern  . Not on file  Social History Narrative   From [place]   Divorced, remarried then widowed, 3 children (from 21st marriage)   Likes to golf   Prev 4 sport athlete in [place], football fan.    He goes to the [employer]   Social  Determinants of Health   Financial Resource Strain:   . Difficulty of Paying Living Expenses:   Food Insecurity:   . Worried About Charity fundraiser in the Last Year:   . Arboriculturist in the Last Year:   Transportation Needs:   . Lack  of Transportation (Medical):   Marland Kitchen Lack of Transportation (Non-Medical):   Physical Activity:   . Days of Exercise per Week:   . Minutes of Exercise per Session:   Stress:   . Feeling of Stress :   Social Connections:   . Frequency of Communication with Friends and Family:   . Frequency of Social Gatherings with Friends and Family:   . Attends Religious Services:   . Active Member of Clubs or Organizations:   . Attends Archivist Meetings:   Marland Kitchen Marital Status:   Intimate Partner Violence:   . Fear of Current or Ex-Partner:   . Emotionally Abused:   Marland Kitchen Physically Abused:   . Sexually Abused:    Review of Systems No fever Hasn't felt sick No back injury    Objective:   Physical Exam  Skin:  Crusted lesion in mid upper lumbar back--still draining some pus and blood Extensive redness across half of back to the right (18cm long) and about 5cm wide----warm and tender           Assessment & Plan:

## 2019-06-24 NOTE — Telephone Encounter (Signed)
Sounds like a cyst---perhaps infected That is appropriate for the office

## 2019-06-24 NOTE — Assessment & Plan Note (Signed)
Extensive spread across half of back Given the extent of involvement--will give rocephin x 1 Continue with augmentin as oral therapy Discussed that girlfriend should monitor to be sure the redness is decreasing

## 2019-06-24 NOTE — Telephone Encounter (Signed)
Pt has place on back that is very sore and feels like a lot of pressure; pt not resting well at night due to hurting for 1 1/2 wk. Pt said 2 places next to each other on middle of back;draining a purulent drainage now. No fever.pt cannot describe pain level but says cannot get comfortable.Pt has no covid symptoms, no travel and no known exposure to + covid. UC precautions given and pt voiced understanding.pt scheduled appt today with Dr Silvio Pate at 4 PM.

## 2019-06-24 NOTE — Assessment & Plan Note (Signed)
Worsened over the past week---unclear how long it has been there Expressed some addition pus but mostly empty now Discussed warm compresses to fully clear the pus No I&D since mostly drained through natural pores in skin/cyst

## 2019-07-03 ENCOUNTER — Encounter: Payer: Self-pay | Admitting: Internal Medicine

## 2019-07-03 ENCOUNTER — Telehealth: Payer: Self-pay

## 2019-07-03 ENCOUNTER — Ambulatory Visit (INDEPENDENT_AMBULATORY_CARE_PROVIDER_SITE_OTHER): Payer: Medicare Other | Admitting: Internal Medicine

## 2019-07-03 ENCOUNTER — Other Ambulatory Visit: Payer: Self-pay

## 2019-07-03 DIAGNOSIS — L089 Local infection of the skin and subcutaneous tissue, unspecified: Secondary | ICD-10-CM | POA: Diagnosis not present

## 2019-07-03 DIAGNOSIS — L723 Sebaceous cyst: Secondary | ICD-10-CM | POA: Diagnosis not present

## 2019-07-03 MED ORDER — AMOXICILLIN-POT CLAVULANATE 875-125 MG PO TABS: 1.0000 | ORAL_TABLET | Freq: Two times a day (BID) | ORAL | 0 refills | Status: DC

## 2019-07-03 NOTE — Assessment & Plan Note (Signed)
Cellulitis much better but still some inflammation in cyst  Will extend the antibiotic 10 days Continue heat to allow full drainage Can consider excision if ongoing problems once the inflammation is better

## 2019-07-03 NOTE — Progress Notes (Signed)
Subjective:    Patient ID: [name], male    DOB: [birth year], 86 y.o.   MRN: [id number]  HPI Here for follow up on infected cyst of back This visit occurred during the SARS-CoV-2 public health emergency.  Safety protocols were in place, including screening questions prior to the visit, additional usage of staff PPE, and extensive cleaning of exam room while observing appropriate contact time as indicated for disinfecting solutions.   Clearly improved considerably with the antibiotics Soreness is gone No trouble sleeping on it now Finished the antibiotic yesterday  Current Outpatient Medications on File Prior to Visit  Medication Sig Dispense Refill  . apixaban (ELIQUIS) 5 MG TABS tablet Take 1 tablet (5 mg total) by mouth 2 (two) times daily. 60 tablet 10  . atorvastatin (LIPITOR) 40 MG tablet Take 1 tablet (40 mg total) by mouth daily. 90 tablet 3  . clotrimazole (LOTRIMIN) 1 % cream Apply 1 application topically as needed (SKIN IRRITATION). 30 g 0  . levothyroxine (SYNTHROID) 137 MCG tablet TAKE 1 TABLET BY MOUTH ONCE DAILY BEFOREBREAKFAST 90 tablet 0  . metoprolol succinate (TOPROL-XL) 25 MG 24 hr tablet Take 1 tablet (25 mg total) by mouth daily. Take with or immediately following a meal. 90 tablet 3  . ramipril (ALTACE) 5 MG capsule TAKE 1 CAPSULE BY MOUTH TWICE DAILY 180 capsule 3  . sildenafil (REVATIO) 20 MG tablet TAKE 3 TO 5 TABLETS BY MOUTH DAILY AS NEEDED 50 tablet 12   No current facility-administered medications on file prior to visit.    Allergies  Allergen Reactions  . Dabigatran Etexilate Mesylate     Blood in urine  . Xarelto [Rivaroxaban] Shortness Of Breath and Other (See Comments)    SOB, CHILLS    Past Medical History:  Diagnosis Date  . AAA (abdominal aortic aneurysm) ([place])    s/p stent graft repair 2012  . Arthritis    "right ankle" (04/17/2014)  . Atrial fibrillation ([place])    a. permanent;  b. Hematuria on Pradaxa;  c. cold intol and chills  with Xarelto  . Bladder cancer ([place])   . Cardiomyopathy [facility])    a. Echo (03/2013): Inferior and inferolateral AK, EF 40-45%, mild MR, mod LAE, mod RAE, mod TR, mild PI, PASP 46  . Chronic kidney disease   . COPD (chronic obstructive pulmonary disease) ([place])   . Coronary artery disease s/p cabg x7  1992   a. s/p CABG in 1992 (X 7 - Dr. Redmond Pulling);  b.  Myoview (01/2011): Fixed inferior defect suggestive of prior MI versus diaphragmatic attenuation, apical reversibility possibly suspicious for small area of infarction with peri-infarct ischemia, no significant change from prior study, not gated, low risk study;  c.  Echo (05/2010): Mod LVH, EF 55-65%, mild AI, mild MR, moderate LAE, mild RAE, moderate   . Dysrhythmia   . Hematuria bladder tumor ---  followed by Dr Karsten Ro  . Hiatal hernia    repaired in 1992 w/OHS  . Hx of cardiovascular stress test    Lexiscan Myoview (03/2013):  No ischemia; not gated.  . Hyperglycemia diet controlled  . Hyperlipidemia   . Hypertension   . Hypothyroidism   . Obesity   . Organic impotence   . S/P AAA repair using bifurcation graft   . Sleep apnea no rx cpap   mild- tested  2012  . Ventricular ectopy    mild    Past Surgical History:  Procedure Laterality Date  . ABDOMINAL AORTIC  ANEURYSM REPAIR  2012   using bifurcation graft  . CARDIAC CATHETERIZATION  03-1990   Positive  . CARDIOVASCULAR STRESS TEST  02-01-2011  NUCLEAR NO EXERCISE   LOW RISK STUDY.  SMALL APICAL DEFECT. NO EVIDENCE OF MULTIZONE ISCHEMIA.  Marland Kitchen CORONARY ARTERY BYPASS GRAFT  05/1990   CABG X 7  . ENDOVASCULAR STENT INSERTION  02/15/2011   Procedure: ENDOVASCULAR STENT GRAFT INSERTION;  Surgeon: Hinda Lenis, MD;  Location: [place];  Service: Vascular;  Laterality: N/A;  Insertion of endovascular stent graft   . ETT  10/31/00   WNL  . HIATAL HERNIA REPAIR  1992   "repaired when I had my OHS"  . Hospital - Afib  03/28/02  . INGUINAL HERNIA REPAIR Left 1997  . JOINT REPLACEMENT  Right 04-17-14   Ankle  . TOTAL ANKLE ARTHROPLASTY Right 04/17/2014   Procedure: RIGHT TOTAL ANKLE ARTHOPLASTY;  Surgeon: Wylene Simmer, MD;  Location: [place];  Service: Orthopedics;  Laterality: Right;  . TOTAL ANKLE REPLACEMENT Right 04/17/2014  . TRANSURETHRAL RESECTION OF BLADDER TUMOR  03/25/2011   Procedure: TRANSURETHRAL RESECTION OF BLADDER TUMOR (TURBT);  Surgeon: Claybon Jabs, MD;  Location: [facility];  Service: Urology;  Laterality: N/A;    Family History  Problem Relation Age of Onset  . Obesity Mother 13       deceased  . Hypertension Mother   . Diabetes Mother   . Heart disease Mother        before age 33  . Hyperlipidemia Mother   . Alcohol abuse Sister 18       deceased  . Other Father        deceased unknown  . Colon cancer Neg Hx   . Prostate cancer Neg Hx   . Anesthesia problems Neg Hx     Social History   Socioeconomic History  . Marital status: Widowed    Spouse name: Not on file  . Number of children: Not on file  . Years of education: Not on file  . Highest education level: Not on file  Occupational History  . Occupation: retired Pension scheme manager.    Employer: retired    Comment: 2001. Likes to play golf . Travels with motor home  Tobacco Use  . Smoking status: Former Smoker    Packs/day: 1.25    Years: 30.00    Pack years: 37.50    Types: Cigarettes    Quit date: 02/28/1990    Years since quitting: 29.3  . Smokeless tobacco: Never Used  Substance and Sexual Activity  . Alcohol use: No    Alcohol/week: 0.0 standard drinks    Comment: basically no alcohol use.  only very rare alcohol use.   . Drug use: No  . Sexual activity: Yes  Other Topics Concern  . Not on file  Social History Narrative   From [place]   Divorced, remarried then widowed, 3 children (from 28st marriage)   Likes to golf   Prev 4 sport athlete in [place], football fan.    He goes to the [employer]   Social Determinants of Health   Financial Resource Strain:    . Difficulty of Paying Living Expenses:   Food Insecurity:   . Worried About Charity fundraiser in the Last Year:   . Arboriculturist in the Last Year:   Transportation Needs:   . Film/video editor (Medical):   Marland Kitchen Lack of Transportation (Non-Medical):   Physical Activity:   .  Days of Exercise per Week:   . Minutes of Exercise per Session:   Stress:   . Feeling of Stress :   Social Connections:   . Frequency of Communication with Friends and Family:   . Frequency of Social Gatherings with Friends and Family:   . Attends Religious Services:   . Active Member of Clubs or Organizations:   . Attends Archivist Meetings:   Marland Kitchen Marital Status:   Intimate Partner Violence:   . Fear of Current or Ex-Partner:   . Emotionally Abused:   Marland Kitchen Physically Abused:   . Sexually Abused:    Review of Systems No fever No N/V Did have some looser stools with the antibiotic    Objective:   Physical Exam  Skin:  Still has bright red inflamed area (~6cm oval) in mid back Still warm and some serous drainage still Indurated area laterally ~4cm Medial has resolved           Assessment & Plan:

## 2019-07-03 NOTE — Telephone Encounter (Signed)
See OV note I extended his antibiotics

## 2019-07-03 NOTE — Telephone Encounter (Signed)
Pt said was seen on 06/24/19 with cyst on back; pt was given injection and oral abx. Pt finished the augmentin on 07/02/19 but still has lower part of cyst feeling hard and does not appear worse but does not appear better in that area. It is not painful now but is itching and the redness is slightly better. Area is still draining small amt. Pt request appt to see what else is needed. Pt scheduled in office appt with Dr Silvio Pate 07/03/19 at 11:15. Pt has no covid symptoms, no travel and no known exposure to + covid.

## 2019-07-18 ENCOUNTER — Other Ambulatory Visit: Payer: Self-pay | Admitting: Family Medicine

## 2019-07-18 ENCOUNTER — Other Ambulatory Visit: Payer: Self-pay | Admitting: Cardiovascular Disease

## 2019-07-18 NOTE — Telephone Encounter (Signed)
Pt last saw Richardson Dopp, [place] on 04/10/18, pt is overdue for follow-up. Sent staff message to schedulers to call pt to schedule follow-up will await appt to refill rx. Last labs 04/10/18, pt is also overdue for labwork as well pt will needs labs done at [place] as well.

## 2019-07-24 NOTE — Telephone Encounter (Signed)
Called the pt and advised that he will need an appt and labs to refill the Eliquis and he verbalized understanding pt has 2-3wks of Eliquis left. Transferred pt to main line for an appt and he is aware that I will refill med after appt has been made and place a note that he needs labs.

## 2019-07-25 NOTE — Telephone Encounter (Signed)
Pt made follow-up appt with Richardson Dopp, [place] on 09/04/19.  Age 86, weight 119.3kg, last labs 04/10/18 Creat 1.08, based on specified criteria pt is on appropriate dosage of Eliquis 5mg  BID.  Made note on appt pt will need CBC and BMP at [place] on 09/04/19 will refill Eliquis to get pt to appt.

## 2019-09-03 NOTE — Progress Notes (Signed)
Cardiology Office Note:    Date:  09/04/2019   ID:  [name], DOB 11-22-1933, MRN [id number]  PCP:  Tonia Ghent, MD  Cardiologist:  Sherren Mocha, MD / Richardson Dopp, PA-C  Electrophysiologist:  None   Referring MD: Tonia Ghent, MD   Chief Complaint:  Follow-up (CAD, CHF, A. fib)    Patient Profile:    [name] is a 86 y.o. male with:   Coronary artery disease   S/p CABG in 5956  Systolic CHF  Ischemic CM   Echocardiogram 2/15: EF 40-45  Permanent atrial fibrillation   CHA2DS2-VASc=5 (age x 2, HTN, CAD, CHF) >> Apixaban    Abdominal aortic aneurysm s/p EVAR  Chronic kidney disease  COPD   Hypertension   Hyperlipidemia    Prior CV studies: Echo (03/2013):  Inferior and inferolateral AK, EF 40-45%, mild MR, mod LAE, mod RAE, mod TR, mild PI, PASP 46  Lexiscan Myoview (03/2013):  No ischemia; not gated.  Myoview (01/2011):  Fixed inferior defect suggestive of prior MI versus diaphragmatic attenuation, apical reversibility possibly suspicious for small area of infarction with peri-infarct ischemia, no significant change from prior study, not gated, low risk study.   Echo (05/2010):  Moderate LVH, EF 55-65%, mild AI, mild MR, moderate LAE, mild RAE, moderate to severe TR, PASP 45.  History of Present Illness:    [name] was last seen in 03/2018.  He returns for follow up.  He is here alone.  Since last seen, he has done well.  He has not had chest discomfort or significant shortness of breath.  He does get tired.  He has a lot of issues with his legs.  He has not had syncope, orthopnea or significant leg swelling.  Past Medical History:  Diagnosis Date  . AAA (abdominal aortic aneurysm) ([place])    s/p stent graft repair 2012  . Arthritis    "right ankle" (04/17/2014)  . Atrial fibrillation ([place])    a. permanent;  b. Hematuria on Pradaxa;  c. cold intol and chills with Xarelto  . Bladder cancer ([place])   . Cardiomyopathy [facility])     a. Echo (03/2013): Inferior and inferolateral AK, EF 40-45%, mild MR, mod LAE, mod RAE, mod TR, mild PI, PASP 46  . Chronic kidney disease   . COPD (chronic obstructive pulmonary disease) ([place])   . Coronary artery disease s/p cabg x7  1992   a. s/p CABG in 1992 (X 7 - Dr. Redmond Pulling);  b.  Myoview (01/2011): Fixed inferior defect suggestive of prior MI versus diaphragmatic attenuation, apical reversibility possibly suspicious for small area of infarction with peri-infarct ischemia, no significant change from prior study, not gated, low risk study;  c.  Echo (05/2010): Mod LVH, EF 55-65%, mild AI, mild MR, moderate LAE, mild RAE, moderate   . Dysrhythmia   . Hematuria bladder tumor ---  followed by Dr Karsten Ro  . Hiatal hernia    repaired in 1992 w/[facility]  . Hx of cardiovascular stress test    Lexiscan Myoview (03/2013):  No ischemia; not gated.  . Hyperglycemia diet controlled  . Hyperlipidemia   . Hypertension   . Hypothyroidism   . Obesity   . Organic impotence   . S/P AAA repair using bifurcation graft   . Sleep apnea no rx cpap   mild- tested  2012  . Ventricular ectopy    mild    Current Medications: Current Meds  Medication Sig  . amoxicillin-clavulanate (AUGMENTIN) 875-125  MG tablet Take 1 tablet by mouth 2 (two) times daily.  Marland Kitchen apixaban (ELIQUIS) 5 MG TABS tablet Take 1 tablet (5 mg total) by mouth 2 (two) times daily. Overdue for follow-up with MD and Lab work.  MUST keep appt for future refills.  Marland Kitchen atorvastatin (LIPITOR) 40 MG tablet Take 1 tablet (40 mg total) by mouth daily. patient should make an appointment for physical exam prior to further refills.  . clotrimazole (LOTRIMIN) 1 % cream Apply 1 application topically as needed (SKIN IRRITATION).  Marland Kitchen levothyroxine (SYNTHROID) 137 MCG tablet TAKE 1 TABLET BY MOUTH ONCE DAILY BEFOREBREAKFAST  . metoprolol succinate (TOPROL-XL) 25 MG 24 hr tablet Take 1 tablet (25 mg total) by mouth daily. Take with or immediately following a meal.  .  ramipril (ALTACE) 5 MG capsule TAKE 1 CAPSULE BY MOUTH TWICE DAILY  . sildenafil (REVATIO) 20 MG tablet TAKE 3 TO 5 TABLETS BY MOUTH DAILY AS NEEDED     Allergies:   Dabigatran etexilate mesylate and Xarelto [rivaroxaban]   Social History   Tobacco Use  . Smoking status: Former Smoker    Packs/day: 1.25    Years: 30.00    Pack years: 37.50    Types: Cigarettes    Quit date: 02/28/1990    Years since quitting: 29.5  . Smokeless tobacco: Never Used  Vaping Use  . Vaping Use: Never used  Substance Use Topics  . Alcohol use: No    Alcohol/week: 0.0 standard drinks    Comment: basically no alcohol use.  only very rare alcohol use.   . Drug use: No     Family Hx: The patient's family history includes Alcohol abuse (age of onset: 60) in his sister; Diabetes in his mother; Heart disease in his mother; Hyperlipidemia in his mother; Hypertension in his mother; Obesity (age of onset: 80) in his mother; Other in his father. There is no history of Colon cancer, Prostate cancer, or Anesthesia problems.  Review of Systems  Respiratory: Negative for hemoptysis.   Gastrointestinal: Negative for hematochezia and melena.  Genitourinary: Negative for hematuria.     EKGs/Labs/Other Test Reviewed:    EKG:  EKG is  ordered today.  The ekg ordered today demonstrates atrial fibrillation, HR 62, left axis deviation, right bundle branch block, QTC 466  Recent Labs: No results found for requested labs within last 8760 hours.   Recent Lipid Panel Lab Results  Component Value Date/Time   CHOL 156 04/10/2018 09:26 AM   TRIG 186 (H) 04/10/2018 09:26 AM   HDL 39 (L) 04/10/2018 09:26 AM   CHOLHDL 4.0 04/10/2018 09:26 AM   CHOLHDL 5 03/21/2017 10:31 AM   LDLCALC 80 04/10/2018 09:26 AM   LDLDIRECT 99.0 03/21/2017 10:31 AM    Physical Exam:    VS:  BP (!) 168/84   Pulse 62   Ht '5\' 11"'  (1.803 m)   Wt 260 lb 3.2 oz (118 kg)   SpO2 98%   BMI 36.29 kg/m     Wt Readings from Last 3 Encounters:    09/04/19 260 lb 3.2 oz (118 kg)  07/03/19 263 lb (119.3 kg)  06/24/19 262 lb (118.8 kg)     Constitutional:      Appearance: Healthy appearance. Not in distress.  Neck:     Thyroid: No thyromegaly.     Vascular: JVD normal.  Pulmonary:     Effort: Pulmonary effort is normal.     Breath sounds: No wheezing. No rales.  Cardiovascular:  Normal rate. Regular rhythm. Normal S1. Normal S2.     Murmurs: There is no murmur.  Edema:    Peripheral edema absent.  Abdominal:     Palpations: Abdomen is soft.  Skin:    General: Skin is warm and dry.  Neurological:     General: No focal deficit present.     Mental Status: Alert and oriented to person, place and time.     Cranial Nerves: Cranial nerves are intact.      ASSESSMENT & PLAN:    1. Coronary artery disease involving native coronary artery of native heart without angina pectoris 2. RBBB (right bundle branch block) Hx of CABG in 1992 with Dr. Redmond Pulling.  Myoview in 2015 was low risk.  He is not having anginal symptoms.  However, the right bundle branch block on his electrocardiogram appears to be new.  It has been 6 years since his last stress test, I have recommended that we obtain a follow-up Lexiscan Myoview as well as an echocardiogram.  He is not on aspirin as he is on Apixaban.  Continue atorvastatin, metoprolol succinate.  Follow-up in 1 year unless stress test and/or echocardiogram are abnormal.  3. Chronic systolic CHF (congestive heart failure) ([place]) EF 40-45 by echocardiogram in 2015.  Ischemic cardiomyopathy.  NYHA II.  Volume status appears stable.  Continue current dose of beta-blocker, ACE inhibitor.  As his right bundle branch block is new, a follow-up echocardiogram will be obtained as outlined above.  4. Permanent atrial fibrillation (HCC) Rate is controlled.  He is tolerating anticoagulation.  Continue current dose of Apixaban.  Arrange follow-up c-Met, CBC.  5. Essential hypertension He tends to have high  blood pressure in the office.  It is usually normal at home.  I have asked him to monitor his blood pressure the next couple weeks and send me those readings for review.  6. Pure hypercholesterolemia Continue atorvastatin.  Arrange follow-up lipids, c-Met.    Dispo:  Return in about 1 year (around 09/03/2020) for Routine Follow Up, w/ Richardson Dopp, PA-C, in person.   Medication Adjustments/Labs and Tests Ordered: Current medicines are reviewed at length with the patient today.  Concerns regarding medicines are outlined above.  Tests Ordered: Orders Placed This Encounter  Procedures  . Comprehensive metabolic panel  . CBC  . Lipid panel  . MYOCARDIAL PERFUSION IMAGING  . EKG 12-Lead  . ECHOCARDIOGRAM COMPLETE   Medication Changes: No orders of the defined types were placed in this encounter.   Signed, Richardson Dopp, PA-C  09/04/2019 10:40 AM    [facility] [place], [place], [place]  [address] Phone: [phone]; Fax: [phone]

## 2019-09-04 ENCOUNTER — Encounter: Payer: Self-pay | Admitting: Physician Assistant

## 2019-09-04 ENCOUNTER — Ambulatory Visit (INDEPENDENT_AMBULATORY_CARE_PROVIDER_SITE_OTHER): Payer: Medicare Other | Admitting: Physician Assistant

## 2019-09-04 ENCOUNTER — Other Ambulatory Visit: Payer: Self-pay

## 2019-09-04 VITALS — BP 168/84 | HR 62 | Ht 71.0 in | Wt 260.2 lb

## 2019-09-04 DIAGNOSIS — I2510 Atherosclerotic heart disease of native coronary artery without angina pectoris: Secondary | ICD-10-CM

## 2019-09-04 DIAGNOSIS — I4510 Unspecified right bundle-branch block: Secondary | ICD-10-CM

## 2019-09-04 DIAGNOSIS — I4821 Permanent atrial fibrillation: Secondary | ICD-10-CM

## 2019-09-04 DIAGNOSIS — I10 Essential (primary) hypertension: Secondary | ICD-10-CM | POA: Diagnosis not present

## 2019-09-04 DIAGNOSIS — I5022 Chronic systolic (congestive) heart failure: Secondary | ICD-10-CM | POA: Diagnosis not present

## 2019-09-04 DIAGNOSIS — E7800 Pure hypercholesterolemia, unspecified: Secondary | ICD-10-CM

## 2019-09-04 NOTE — Patient Instructions (Signed)
Medication Instructions:   Your physician recommends that you continue on your current medications as directed. Please refer to the Current Medication list given to you today.  *If you need a refill on your cardiac medications before your next appointment, please call your pharmacy*  Lab Work:  Your physician recommends that you return for lab work fasting when you come in for your stress test.  Testing/Procedures:  Your physician has requested that you have an echocardiogram. Echocardiography is a painless test that uses sound waves to create images of your heart. It provides your doctor with information about the size and shape of your heart and how well your heart's chambers and valves are working. This procedure takes approximately one hour. There are no restrictions for this procedure.  Your physician has requested that you have a lexiscan myoview. For further information please visit [email or web address]. Please follow instruction sheet, as given.  Follow-Up: At [facility], you and your health needs are our priority.  As part of our continuing mission to provide you with exceptional heart care, we have created designated Provider Care Teams.  These Care Teams include your primary Cardiologist (physician) and Advanced Practice Providers (APPs -  Physician Assistants and Nurse Practitioners) who all work together to provide you with the care you need, when you need it.  We recommend signing up for the patient portal called "MyChart".  Sign up information is provided on this After Visit Summary.  MyChart is used to connect with patients for Virtual Visits (Telemedicine).  Patients are able to view lab/test results, encounter notes, upcoming appointments, etc.  Non-urgent messages can be sent to your provider as well.   To learn more about what you can do with MyChart, go to [email or web address].    Your next appointment:   12 month(s)  The format for your next appointment:   In  Person  Provider:   Richardson Dopp, PA-C   Other Instructions Check blood pressure once a day for 2 weeks and call with readings.

## 2019-09-19 ENCOUNTER — Telehealth (HOSPITAL_COMMUNITY): Payer: Self-pay

## 2019-09-19 NOTE — Telephone Encounter (Signed)
Detailed instructions left on the patient's answering machine. Asked to call back with any questions. S.Caroline Longie EMTP 

## 2019-09-24 ENCOUNTER — Ambulatory Visit (HOSPITAL_BASED_OUTPATIENT_CLINIC_OR_DEPARTMENT_OTHER): Payer: Medicare Other

## 2019-09-24 ENCOUNTER — Other Ambulatory Visit: Payer: Medicare Other

## 2019-09-24 ENCOUNTER — Ambulatory Visit (HOSPITAL_COMMUNITY): Payer: Medicare Other | Attending: Cardiology

## 2019-09-24 ENCOUNTER — Telehealth: Payer: Self-pay | Admitting: *Deleted

## 2019-09-24 ENCOUNTER — Other Ambulatory Visit: Payer: Self-pay

## 2019-09-24 DIAGNOSIS — I5022 Chronic systolic (congestive) heart failure: Secondary | ICD-10-CM

## 2019-09-24 DIAGNOSIS — I2510 Atherosclerotic heart disease of native coronary artery without angina pectoris: Secondary | ICD-10-CM

## 2019-09-24 DIAGNOSIS — I4510 Unspecified right bundle-branch block: Secondary | ICD-10-CM

## 2019-09-24 DIAGNOSIS — I4821 Permanent atrial fibrillation: Secondary | ICD-10-CM | POA: Diagnosis not present

## 2019-09-24 DIAGNOSIS — I10 Essential (primary) hypertension: Secondary | ICD-10-CM | POA: Diagnosis not present

## 2019-09-24 DIAGNOSIS — E7800 Pure hypercholesterolemia, unspecified: Secondary | ICD-10-CM | POA: Diagnosis not present

## 2019-09-24 LAB — COMPREHENSIVE METABOLIC PANEL
ALT: 15 IU/L (ref 0–44)
AST: 16 IU/L (ref 0–40)
Albumin/Globulin Ratio: 1.5 (ref 1.2–2.2)
Albumin: 4.5 g/dL (ref 3.6–4.6)
Alkaline Phosphatase: 102 IU/L (ref 48–121)
BUN/Creatinine Ratio: 11 (ref 10–24)
BUN: 12 mg/dL (ref 8–27)
Bilirubin Total: 0.9 mg/dL (ref 0.0–1.2)
CO2: 24 mmol/L (ref 20–29)
Calcium: 9.7 mg/dL (ref 8.6–10.2)
Chloride: 102 mmol/L (ref 96–106)
Creatinine, Ser: 1.11 mg/dL (ref 0.76–1.27)
GFR calc Af Amer: 71 mL/min/{1.73_m2} (ref >59)
GFR calc non Af Amer: 61 mL/min/{1.73_m2} (ref >59)
Globulin, Total: 3 g/dL (ref 1.5–4.5)
Glucose: 109 mg/dL — ABNORMAL HIGH (ref 65–99)
Potassium: 4.9 mmol/L (ref 3.5–5.2)
Sodium: 141 mmol/L (ref 134–144)
Total Protein: 7.5 g/dL (ref 6.0–8.5)

## 2019-09-24 LAB — MYOCARDIAL PERFUSION IMAGING
LV dias vol: 125 mL (ref 62–150)
LV sys vol: 50 mL
Peak HR: 92 {beats}/min
Rest HR: 71 {beats}/min
SDS: 2
SRS: 1
SSS: 3
TID: 0.9

## 2019-09-24 LAB — CBC
Hematocrit: 49.3 % (ref 37.5–51.0)
Hemoglobin: 16.1 g/dL (ref 13.0–17.7)
MCH: 29.8 pg (ref 26.6–33.0)
MCHC: 32.7 g/dL (ref 31.5–35.7)
MCV: 91 fL (ref 79–97)
Platelets: 231 10*3/uL (ref 150–450)
RBC: 5.4 x10E6/uL (ref 4.14–5.80)
RDW: 13.4 % (ref 11.6–15.4)
WBC: 7.9 10*3/uL (ref 3.4–10.8)

## 2019-09-24 LAB — LIPID PANEL
Chol/HDL Ratio: 3.8 ratio (ref 0.0–5.0)
Cholesterol, Total: 151 mg/dL (ref 100–199)
HDL: 40 mg/dL (ref >39)
LDL Chol Calc (NIH): 81 mg/dL (ref 0–99)
Triglycerides: 173 mg/dL — ABNORMAL HIGH (ref 0–149)
VLDL Cholesterol Cal: 30 mg/dL (ref 5–40)

## 2019-09-24 LAB — ECHOCARDIOGRAM COMPLETE
Area-P 1/2: 4.56 cm2
Height: 71 in
P 1/2 time: 1043 msec
S' Lateral: 4.1 cm
Weight: 4160 oz

## 2019-09-24 MED ORDER — TECHNETIUM TC 99M TETROFOSMIN IV KIT
31.7000 | PACK | Freq: Once | INTRAVENOUS | Status: AC | PRN
  Administered 2019-09-24: 31.7 via INTRAVENOUS
  Filled 2019-09-24: qty 32

## 2019-09-24 MED ORDER — AMLODIPINE BESYLATE 2.5 MG PO TABS
2.5000 mg | ORAL_TABLET | Freq: Every day | ORAL | 0 refills | Status: DC
Start: 2019-09-24 — End: 2019-10-24

## 2019-09-24 MED ORDER — TECHNETIUM TC 99M TETROFOSMIN IV KIT
10.2000 | PACK | Freq: Once | INTRAVENOUS | Status: AC | PRN
  Administered 2019-09-24: 10.2 via INTRAVENOUS
  Filled 2019-09-24: qty 11

## 2019-09-24 MED ORDER — REGADENOSON 0.4 MG/5ML IV SOLN
0.4000 mg | Freq: Once | INTRAVENOUS | Status: AC
  Administered 2019-09-24: 0.4 mg via INTRAVENOUS

## 2019-09-24 NOTE — Addendum Note (Signed)
Addended by: Mady Haagensen on: 09/24/2019 03:09 PM   Modules accepted: Orders

## 2019-09-24 NOTE — Telephone Encounter (Signed)
Follow Up  Patient returning call. Transferred call to [facility].

## 2019-09-24 NOTE — Telephone Encounter (Signed)
Patient returned my phone call, he is aware to add Norvasc 2.5 mg, 1 tablet by mouth once a day. Prescription sent to pharmacy, patient aware to continue to monitor blood pressure daily and call with readings in 2 weeks. Patient verbalized understanding and thanked me for the call.

## 2019-09-24 NOTE — Telephone Encounter (Signed)
Reviewed for Richardson Dopp, PA  Would add Norvasc 2.5 mg a day.   Continue to monitor BP and send readings in about 2 weeks.   Salt restriction as well.   Burtis Junes, RN, [place] [address] [place], [place]  [address] [phone]

## 2019-09-24 NOTE — Telephone Encounter (Signed)
I called and left patient a message to call back. 

## 2019-09-24 NOTE — Telephone Encounter (Signed)
Cindy in Nuclear Dept brought to triage this AM the pts logged readings he has provided, as Richardson Dopp PA-C requested at his last OV with the pt on 09/04/19.  The following readings are below:   [facility] 09/06/19-- 141/79 HR-68  09/08/19--140/77 HR-82  09/09/19--135/80 HR-80 AND 148/73-HR-90--NO TIME PROVIDED ON EACH READING RECORDED  09/10/19--160/82 HR--80  09/11/19--137/77 HR-78  09/12/19--146/87 HR-65 AND 152/85-HR-68--NO TIME PROVIDED ON ON EACH READING RECORDED  09/13/19--180/86--HR-68 AND 143/85-HR-66 NO TIME PROVIDED ON EACH READING RECORDED  09/16/19--147/86-HR-56 AND 170/80-HR-74 AND 168/91-HR-74--NO TIME PROVIDED ON EACH READING RECORDED  09/18/19--179/81-HR-68 AND 162/78-HR-66-NO TIME PROVIDED ON EACH READING RECORDED  09/19/19--166/95--HR-70 AND 159/90-HR-71--NO TIME PROVIDED ON EACH READING RECORDED  09/20/19--173/97-HR-61 AND 151/96-HR-74--NO TIME PROVIDED ON EACH READING RECORDED  09/21/19--153/84--HR-62 AND 166/85-HR-51--NO TIME PROVIDED ON EACH READING RECORDED  09/22/19--151/88-HR-64   Will route BP/HR readings to [employer] PA-C and his covering CMA for further review, recommendation, and follow-up with the pt thereafter.

## 2019-10-21 NOTE — Telephone Encounter (Signed)
Lm to call back ./cy 

## 2019-10-21 NOTE — Telephone Encounter (Signed)
Follow up   Pt returning call from [place], he said he will be going out again and to call him back in the morning.

## 2019-10-21 NOTE — Telephone Encounter (Signed)
Patient is calling to follow up regarding medication. He states he is not sure whether or not he needs to continue taking amLODipine (NORVASC) 2.5 MG tablet. He states states he has 3 tablets remaining however, he does not know if he will need a new prescription. Please advise.

## 2019-10-22 NOTE — Telephone Encounter (Signed)
I called and left patient a message to call back. 

## 2019-10-24 ENCOUNTER — Telehealth: Payer: Self-pay | Admitting: Physician Assistant

## 2019-10-24 MED ORDER — AMLODIPINE BESYLATE 5 MG PO TABS: 5.0000 mg | ORAL_TABLET | Freq: Every day | ORAL | 3 refills | Status: DC

## 2019-10-24 NOTE — Addendum Note (Signed)
Addended by: Aris Georgia, Imre Vecchione L on: 10/24/2019 11:44 AM   Modules accepted: Orders

## 2019-10-24 NOTE — Telephone Encounter (Signed)
Richardson Dopp T, PA-C     10/24/19 9:05 AM Note Reviewed most recent blood pressure reading sent by patient. Records indicate that amlodipine 2.5 mg was started in July.  It is not clear if this has been continued. Please contact the patient and inquire whether or not he is still taking amlodipine 2.5 mg daily. Most recent blood pressure sent are above goal. PLAN: 1.  If he is taking amlodipine 2.5 mg daily, increase to 5 mg daily. 2.  If he has not been taking amlodipine, restart amlodipine 2.5 mg daily. 3.  Call if blood pressure consistently 130/80 or higher. Richardson Dopp, PA-C 10/24/2019 9:05 AM      Informed patient about Richardson Dopp PA note. Patient has been taking amlodipine 2.5 mg daily and he took his last one last night. Will send in amlodipine 5 mg daily per Scott's note. Advised patient to keep recording BP and call if his BP is consistently higher than 130/80.

## 2019-10-24 NOTE — Telephone Encounter (Signed)
Patient returning call.

## 2019-10-24 NOTE — Telephone Encounter (Signed)
Reviewed most recent blood pressure reading sent by patient. Records indicate that amlodipine 2.5 mg was started in July.  It is not clear if this has been continued. Please contact the patient and inquire whether or not he is still taking amlodipine 2.5 mg daily. Most recent blood pressure sent are above goal. PLAN: 1.  If he is taking amlodipine 2.5 mg daily, increase to 5 mg daily. 2.  If he has not been taking amlodipine, restart amlodipine 2.5 mg daily. 3.  Call if blood pressure consistently 130/80 or higher. Johnny Dopp, PA-C 10/24/2019 9:05 AM

## 2019-10-24 NOTE — Telephone Encounter (Signed)
Patient returned call

## 2019-10-24 NOTE — Telephone Encounter (Signed)
Left message for patient to call back  

## 2019-10-24 NOTE — Telephone Encounter (Signed)
Informed patient about Richardson Dopp PA note. Patient has been taking amlodipine 2.5 mg daily and he took his last one last night. Will send in amlodipine 5 mg daily per Scott's note. Advised patient to keep recording BP and call if his BP is consistently higher than 130/80. Patient verbalized understanding.

## 2019-11-07 ENCOUNTER — Other Ambulatory Visit: Payer: Self-pay | Admitting: Family Medicine

## 2019-11-14 ENCOUNTER — Other Ambulatory Visit: Payer: Self-pay | Admitting: Cardiovascular Disease

## 2019-11-14 ENCOUNTER — Other Ambulatory Visit: Payer: Self-pay | Admitting: Family Medicine

## 2019-11-15 NOTE — Telephone Encounter (Signed)
Prescription refill request for Eliquis received.  Last office visit: Kathlen Mody 10/24/2019 Scr: 1.11, 09/24/2019 Age: 86 y.o. Weight: 117.9 kg  Prescription refill sent.

## 2020-01-06 DIAGNOSIS — Z23 Encounter for immunization: Secondary | ICD-10-CM | POA: Diagnosis not present

## 2020-01-28 ENCOUNTER — Other Ambulatory Visit: Payer: Self-pay | Admitting: Family Medicine

## 2020-01-28 NOTE — Telephone Encounter (Signed)
Pharmacy requests refill on: Ramipril 5 mg   LAST REFILL: 11/14/2019 (Q-180, R-0) LAST OV: 07/17/2018 NEXT OV: Not Scheduled  PHARMACY: [facility] requests refill on: Atorvastatin Calcium 40 mg   LAST REFILL: 07/18/2019 (Q-90, R-0) LAST OV: 07/17/2018 NEXT OV: Not Scheduled  PHARMACY: [facility] requests refill on: Levothyroxine Sodium 137 mcg   LAST REFILL: 11/07/2019 LAST OV: 07/17/2018 NEXT OV: Not Scheduled  PHARMACY: [facility]  T[facility] (07/16/2018)

## 2020-01-29 ENCOUNTER — Other Ambulatory Visit: Payer: Self-pay | Admitting: Family Medicine

## 2020-01-29 DIAGNOSIS — I10 Essential (primary) hypertension: Secondary | ICD-10-CM

## 2020-01-29 DIAGNOSIS — I4891 Unspecified atrial fibrillation: Secondary | ICD-10-CM

## 2020-01-30 ENCOUNTER — Other Ambulatory Visit (INDEPENDENT_AMBULATORY_CARE_PROVIDER_SITE_OTHER): Payer: Medicare Other

## 2020-01-30 ENCOUNTER — Other Ambulatory Visit: Payer: Self-pay

## 2020-01-30 DIAGNOSIS — I10 Essential (primary) hypertension: Secondary | ICD-10-CM

## 2020-01-30 LAB — COMPREHENSIVE METABOLIC PANEL
ALT: 12 U/L (ref 0–53)
AST: 11 U/L (ref 0–37)
Albumin: 4.1 g/dL (ref 3.5–5.2)
Alkaline Phosphatase: 81 U/L (ref 39–117)
BUN: 14 mg/dL (ref 6–23)
CO2: 29 mEq/L (ref 19–32)
Calcium: 9.1 mg/dL (ref 8.4–10.5)
Chloride: 103 mEq/L (ref 96–112)
Creatinine, Ser: 1.04 mg/dL (ref 0.40–1.50)
GFR: 66.2 mL/min (ref >60.00)
Glucose, Bld: 103 mg/dL — ABNORMAL HIGH (ref 70–99)
Potassium: 4 mEq/L (ref 3.5–5.1)
Sodium: 140 mEq/L (ref 135–145)
Total Bilirubin: 1.2 mg/dL (ref 0.2–1.2)
Total Protein: 7 g/dL (ref 6.0–8.3)

## 2020-01-30 LAB — LIPID PANEL
Cholesterol: 146 mg/dL (ref 0–200)
HDL: 34.2 mg/dL — ABNORMAL LOW (ref >39.00)
LDL Cholesterol: 73 mg/dL (ref 0–99)
NonHDL: 111.31
Total CHOL/HDL Ratio: 4
Triglycerides: 192 mg/dL — ABNORMAL HIGH (ref 0.0–149.0)
VLDL: 38.4 mg/dL (ref 0.0–40.0)

## 2020-01-30 LAB — CBC WITH DIFFERENTIAL/PLATELET
Basophils Absolute: 0 10*3/uL (ref 0.0–0.1)
Basophils Relative: 0.8 % (ref 0.0–3.0)
Eosinophils Absolute: 0.1 10*3/uL (ref 0.0–0.7)
Eosinophils Relative: 1.9 % (ref 0.0–5.0)
HCT: 43.5 % (ref 39.0–52.0)
Hemoglobin: 14.5 g/dL (ref 13.0–17.0)
Lymphocytes Relative: 32.1 % (ref 12.0–46.0)
Lymphs Abs: 2 10*3/uL (ref 0.7–4.0)
MCHC: 33.4 g/dL (ref 30.0–36.0)
MCV: 92 fl (ref 78.0–100.0)
Monocytes Absolute: 0.5 10*3/uL (ref 0.1–1.0)
Monocytes Relative: 8.8 % (ref 3.0–12.0)
Neutro Abs: 3.5 10*3/uL (ref 1.4–7.7)
Neutrophils Relative %: 56.4 % (ref 43.0–77.0)
Platelets: 214 10*3/uL (ref 150.0–400.0)
RBC: 4.72 Mil/uL (ref 4.22–5.81)
RDW: 13.8 % (ref 11.5–15.5)
WBC: 6.1 10*3/uL (ref 4.0–10.5)

## 2020-01-30 LAB — TSH: TSH: 1.54 u[IU]/mL (ref 0.35–4.50)

## 2020-02-06 ENCOUNTER — Ambulatory Visit (INDEPENDENT_AMBULATORY_CARE_PROVIDER_SITE_OTHER): Payer: Medicare Other | Admitting: Family Medicine

## 2020-02-06 ENCOUNTER — Encounter: Payer: Self-pay | Admitting: Family Medicine

## 2020-02-06 ENCOUNTER — Other Ambulatory Visit: Payer: Self-pay

## 2020-02-06 VITALS — BP 140/70 | HR 98 | Temp 97.1°F | Ht 71.0 in | Wt 262.4 lb

## 2020-02-06 DIAGNOSIS — Z0000 Encounter for general adult medical examination without abnormal findings: Secondary | ICD-10-CM

## 2020-02-06 DIAGNOSIS — Z7189 Other specified counseling: Secondary | ICD-10-CM

## 2020-02-06 DIAGNOSIS — I10 Essential (primary) hypertension: Secondary | ICD-10-CM

## 2020-02-06 DIAGNOSIS — E039 Hypothyroidism, unspecified: Secondary | ICD-10-CM

## 2020-02-06 DIAGNOSIS — I2510 Atherosclerotic heart disease of native coronary artery without angina pectoris: Secondary | ICD-10-CM | POA: Diagnosis not present

## 2020-02-06 DIAGNOSIS — I4891 Unspecified atrial fibrillation: Secondary | ICD-10-CM

## 2020-02-06 DIAGNOSIS — E7800 Pure hypercholesterolemia, unspecified: Secondary | ICD-10-CM

## 2020-02-06 DIAGNOSIS — Z23 Encounter for immunization: Secondary | ICD-10-CM | POA: Diagnosis not present

## 2020-02-06 DIAGNOSIS — N529 Male erectile dysfunction, unspecified: Secondary | ICD-10-CM

## 2020-02-06 MED ORDER — SILDENAFIL CITRATE 20 MG PO TABS
ORAL_TABLET | ORAL | 12 refills | Status: DC
Start: 2020-02-06 — End: 2022-10-28

## 2020-02-06 MED ORDER — LEVOTHYROXINE SODIUM 137 MCG PO TABS
ORAL_TABLET | ORAL | 3 refills | Status: DC
Start: 2020-02-06 — End: 2021-03-10

## 2020-02-06 MED ORDER — RAMIPRIL 5 MG PO CAPS
ORAL_CAPSULE | ORAL | 3 refills | Status: DC
Start: 2020-02-06 — End: 2021-05-02

## 2020-02-06 MED ORDER — ATORVASTATIN CALCIUM 40 MG PO TABS
ORAL_TABLET | ORAL | 3 refills | Status: DC
Start: 2020-02-06 — End: 2021-03-10

## 2020-02-06 NOTE — Progress Notes (Signed)
This visit occurred during the SARS-CoV-2 public health emergency.  Safety protocols were in place, including screening questions prior to the visit, additional usage of staff PPE, and extensive cleaning of exam room while observing appropriate contact time as indicated for disinfecting solutions.  Hypertension/CAD: Using medication without problems or lightheadedness: yes Chest pain with exertion: no Edema:no Short of breath:no  Elevated Cholesterol: Using medications without problems: yes Muscle aches: no Diet compliance: yes.   Exercise: yes, as tolerated.    Hypothyroidism.  No neck mass, no dysphagia.  No ADE on med.  Compliant.    No ADE on sildenafil, no NTG use.    AF.  Still anticoagulated.  No bleeding.  Compliant. Some bruising at baseline.    Flu 2021 Shingles 2012 PNA UTD Tetanus 2008 covid 2021 Colonoscopy not due given his age.  Prostate cancer screening and PSA options(with potential risks and benefits of testing vs not testing) were discussed along with recent recs/guidelines. He declined testing PSAat this point. Advance directive- both daughters and son designated if patient were incapacitated.  Meds, vitals, and allergies reviewed.   ROS: Per HPI unless specifically indicated in ROS section   GEN: nad, alert and oriented HEENT: ncat NECK: supple w/o LA CV: IRR not tachy PULM: ctab, no inc wob ABD: soft, +bs EXT: trace BLE edema at baseline.  SKIN: no acute rash

## 2020-02-06 NOTE — Patient Instructions (Addendum)
Update me as needed.   Take care.  Glad to see you. Don't change your meds for now.

## 2020-02-09 NOTE — Assessment & Plan Note (Signed)
Still anticoagulated.  No bleeding.  Compliant. Some bruising at baseline.  Would continue amlodipine atorvastatin Eliquis and ramipril.

## 2020-02-09 NOTE — Assessment & Plan Note (Signed)
Advance directive- both daughters and son designated if patient were incapacitated.

## 2020-02-09 NOTE — Assessment & Plan Note (Signed)
Would continue as needed sildenafil.  No nitroglycerin use.  Routine cautions given to patient.

## 2020-02-09 NOTE — Assessment & Plan Note (Signed)
Flu 2021 Shingles 2012 PNA UTD Tetanus 2008 covid 2021 Colonoscopy not due given his age.  Prostate cancer screening and PSA options(with potential risks and benefits of testing vs not testing) were discussed along with recent recs/guidelines. He declined testing PSAat this point. Advance directive- both daughters and son designated if patient were incapacitated.

## 2020-02-09 NOTE — Assessment & Plan Note (Signed)
Would continue amlodipine atorvastatin Eliquis and ramipril.  Labs discussed with patient.

## 2020-02-09 NOTE — Assessment & Plan Note (Signed)
Controlled.Would continue amlodipine atorvastatin Eliquis and ramipril.  Labs discussed with patient.

## 2020-02-09 NOTE — Assessment & Plan Note (Signed)
TSH normal.  Continue levothyroxine.  Labs discussed with patient.  He agrees.

## 2020-03-03 ENCOUNTER — Ambulatory Visit
Admission: EM | Admit: 2020-03-03 | Discharge: 2020-03-03 | Disposition: A | Discharge status: Home / Self Care | Payer: Medicare Other | Attending: Family Medicine | Admitting: Family Medicine

## 2020-03-03 ENCOUNTER — Telehealth: Payer: Self-pay

## 2020-03-03 ENCOUNTER — Ambulatory Visit (INDEPENDENT_AMBULATORY_CARE_PROVIDER_SITE_OTHER): Payer: Medicare Other

## 2020-03-03 ENCOUNTER — Encounter: Payer: Self-pay | Admitting: Emergency Medicine

## 2020-03-03 DIAGNOSIS — R059 Cough, unspecified: Secondary | ICD-10-CM | POA: Diagnosis not present

## 2020-03-03 DIAGNOSIS — B349 Viral infection, unspecified: Secondary | ICD-10-CM

## 2020-03-03 DIAGNOSIS — R0781 Pleurodynia: Secondary | ICD-10-CM | POA: Diagnosis not present

## 2020-03-03 DIAGNOSIS — R0602 Shortness of breath: Secondary | ICD-10-CM

## 2020-03-03 DIAGNOSIS — R531 Weakness: Secondary | ICD-10-CM

## 2020-03-03 DIAGNOSIS — R0789 Other chest pain: Secondary | ICD-10-CM

## 2020-03-03 DIAGNOSIS — R0989 Other specified symptoms and signs involving the circulatory and respiratory systems: Secondary | ICD-10-CM

## 2020-03-03 DIAGNOSIS — J811 Chronic pulmonary edema: Secondary | ICD-10-CM | POA: Diagnosis not present

## 2020-03-03 MED ORDER — DEXAMETHASONE SODIUM PHOSPHATE 10 MG/ML IJ SOLN
10.0000 mg | Freq: Once | INTRAMUSCULAR | Status: AC
  Administered 2020-03-03: 10 mg via INTRAMUSCULAR

## 2020-03-03 MED ORDER — CHERATUSSIN AC 100-10 MG/5ML PO SOLN: 5.0000 mL | Freq: Three times a day (TID) | ORAL | 0 refills | Status: DC | PRN

## 2020-03-03 NOTE — Telephone Encounter (Signed)
Noted. Thanks.

## 2020-03-03 NOTE — ED Provider Notes (Incomplete)
[facility]    CSN: [id number] Arrival date & time: 03/03/20  1425      History   Chief Complaint No chief complaint on file.   HPI [name] is a 87 y.o. male.   HPI  Past Medical History:  Diagnosis Date  . AAA (abdominal aortic aneurysm) ([place])    s/p stent graft repair 2012  . Arthritis    "right ankle" (04/17/2014)  . Atrial fibrillation ([place])    a. permanent;  b. Hematuria on Pradaxa;  c. cold intol and chills with Xarelto  . Bladder cancer ([place])   . Cardiomyopathy [facility])    a. Echo (03/2013): Inferior and inferolateral AK, EF 40-45%, mild MR, mod LAE, mod RAE, mod TR, mild PI, PASP 46  . Chronic kidney disease   . COPD (chronic obstructive pulmonary disease) ([place])   . Coronary artery disease s/p cabg x7  1992   a. s/p CABG in 1992 (X 7 - Dr. Redmond Pulling);  b.  Myoview (01/2011): Fixed inferior defect suggestive of prior MI versus diaphragmatic attenuation, apical reversibility possibly suspicious for small area of infarction with peri-infarct ischemia, no significant change from prior study, not gated, low risk study;  c.  Echo (05/2010): Mod LVH, EF 55-65%, mild AI, mild MR, moderate LAE, mild RAE, moderate   . Dysrhythmia   . Hematuria bladder tumor ---  followed by Dr Karsten Ro  . Hiatal hernia    repaired in 1992 w/[facility]  . Hx of cardiovascular stress test    Lexiscan Myoview (03/2013):  No ischemia; not gated.  . Hyperglycemia diet controlled  . Hyperlipidemia   . Hypertension   . Hypothyroidism   . Obesity   . Organic impotence   . S/P AAA repair using bifurcation graft   . Sleep apnea no rx cpap   mild- tested  2012  . Ventricular ectopy    mild    Patient Active Problem List   Diagnosis Date Noted  . Health care maintenance 07/18/2018  . Atrial fibrillation ([place])   . Lower urinary tract symptoms (LUTS) 12/01/2015  . Chronic systolic CHF (congestive heart failure) ([place]) 11/29/2014  . Arthritis of right ankle 04/17/2014  . Advance care  planning 11/24/2013  . Cardiomyopathy, ischemic 06/17/2013  . AAA (abdominal aortic aneurysm) without rupture ([place]) 11/02/2012  . Medicare annual wellness visit, subsequent 12/30/2011  . Bladder cancer ([place]) 07/29/2011  . Intermittent claudication ([place]) 04/29/2011  . Hyperlipidemia 06/13/2010  . ORGANIC IMPOTENCE 04/29/2010  . Hypothyroidism 06/29/2006  . OBESITY 06/29/2006  . Essential hypertension 06/29/2006  . Coronary artery disease involving native coronary artery of native heart without angina pectoris 06/29/2006  . Hyperglycemia 06/29/2006    Past Surgical History:  Procedure Laterality Date  . ABDOMINAL AORTIC ANEURYSM REPAIR  2012   using bifurcation graft  . CARDIAC CATHETERIZATION  03-1990   Positive  . CARDIOVASCULAR STRESS TEST  02-01-2011  NUCLEAR NO EXERCISE   LOW RISK STUDY.  SMALL APICAL DEFECT. NO EVIDENCE OF MULTIZONE ISCHEMIA.  Marland Kitchen CORONARY ARTERY BYPASS GRAFT  05/1990   CABG X 7  . ENDOVASCULAR STENT INSERTION  02/15/2011   Procedure: ENDOVASCULAR STENT GRAFT INSERTION;  Surgeon: Hinda Lenis, MD;  Location: [place];  Service: Vascular;  Laterality: N/A;  Insertion of endovascular stent graft   . ETT  10/31/00   WNL  . HIATAL HERNIA REPAIR  1992   "repaired when I had my [facility]"  . Hospital - Afib  03/28/02  . INGUINAL HERNIA  REPAIR Left 1997  . JOINT REPLACEMENT Right 04-17-14   Ankle  . TOTAL ANKLE ARTHROPLASTY Right 04/17/2014   Procedure: RIGHT TOTAL ANKLE ARTHOPLASTY;  Surgeon: Wylene Simmer, MD;  Location: [place];  Service: Orthopedics;  Laterality: Right;  . TOTAL ANKLE REPLACEMENT Right 04/17/2014  . TRANSURETHRAL RESECTION OF BLADDER TUMOR  03/25/2011   Procedure: TRANSURETHRAL RESECTION OF BLADDER TUMOR (TURBT);  Surgeon: Claybon Jabs, MD;  Location: [facility];  Service: Urology;  Laterality: N/A;       Home Medications    Prior to Admission medications   Medication Sig Start Date End Date Taking? Authorizing Provider   amLODipine (NORVASC) 5 MG tablet Take 1 tablet (5 mg total) by mouth daily. 10/24/19   Richardson Dopp T, PA-C  atorvastatin (LIPITOR) 40 MG tablet TAKE 1 TABLET BY MOUTH ONCE DAILY. 02/06/20   Tonia Ghent, MD  clotrimazole (LOTRIMIN) 1 % cream Apply 1 application topically as needed (SKIN IRRITATION). 04/14/17   Tonia Ghent, MD  ELIQUIS 5 MG TABS tablet TAKE 1 TABLET BY MOUTH TWICE (2) DAILY. MUST KEEP APPT FOR FUTURE REFILLS. 11/15/19   Sherren Mocha, MD  levothyroxine (SYNTHROID) 137 MCG tablet 1 tab a day on an empty stomach 02/06/20   Tonia Ghent, MD  ramipril (ALTACE) 5 MG capsule TAKE 1 CAPSULE BY MOUTH TWICE DAILY 02/06/20   Tonia Ghent, MD  sildenafil (REVATIO) 20 MG tablet TAKE 3 TO 5 TABLETS BY MOUTH DAILY AS NEEDED 02/06/20   Tonia Ghent, MD    Family History Family History  Problem Relation Age of Onset  . Obesity Mother 60       deceased  . Hypertension Mother   . Diabetes Mother   . Heart disease Mother        before age 1  . Hyperlipidemia Mother   . Alcohol abuse Sister 76       deceased  . Other Father        deceased unknown  . Colon cancer Neg Hx   . Prostate cancer Neg Hx   . Anesthesia problems Neg Hx     Social History Social History   Tobacco Use  . Smoking status: Former Smoker    Packs/day: 1.25    Years: 30.00    Pack years: 37.50    Types: Cigarettes    Quit date: 02/28/1990    Years since quitting: 30.0  . Smokeless tobacco: Never Used  Vaping Use  . Vaping Use: Never used  Substance Use Topics  . Alcohol use: No    Alcohol/week: 0.0 standard drinks    Comment: basically no alcohol use.  only very rare alcohol use.   . Drug use: No     Allergies   Dabigatran etexilate mesylate, Xarelto [rivaroxaban], and Metoprolol   Review of Systems Review of Systems   Physical Exam Triage Vital Signs ED Triage Vitals  Enc Vitals Group     BP 03/03/20 1541 (!) 187/88     Pulse Rate 03/03/20 1541 83     Resp 03/03/20 1541  16     Temp 03/03/20 1541 99.1 F (37.3 C)     Temp Source 03/03/20 1541 Oral     SpO2 03/03/20 1541 97 %     Weight 03/03/20 1545 250 lb (113.4 kg)     Height 03/03/20 1545 5\' 11"  (1.803 m)     Head Circumference --      Peak Flow --  Pain Score 03/03/20 1545 0     Pain Loc --      Pain Edu? --      Excl. in GC? --    No data found.  Updated Vital Signs BP (!) 187/88 (BP Location: Right Arm)   Pulse 83   Temp 99.1 F (37.3 C) (Oral)   Resp 16   Ht 5\' 11"  (1.803 m)   Wt 250 lb (113.4 kg)   SpO2 97%   BMI 34.87 kg/m   Visual Acuity Right Eye Distance:   Left Eye Distance:   Bilateral Distance:    Right Eye Near:   Left Eye Near:    Bilateral Near:     Physical Exam   UC Treatments / Results  Labs (all labs ordered are listed, but only abnormal results are displayed) Labs Reviewed  COVID-19, FLU A+B NAA    EKG   Radiology No results found.  Procedures Procedures (including critical care time)  Medications Ordered in UC Medications - No data to display  Initial Impression / Assessment and Plan / UC Course  I have reviewed the triage vital signs and the nursing notes.  Pertinent labs & imaging results that were available during my care of the patient were reviewed by me and considered in my medical decision making (see chart for details).     *** Final Clinical Impressions(s) / UC Diagnoses   Final diagnoses:  Cough   Discharge Instructions   None    ED Prescriptions    None     PDMP not reviewed this encounter.

## 2020-03-03 NOTE — Telephone Encounter (Signed)
Pt said for 3 - 4 days had cold like symptoms; pt has consistent prod cough with dark gray phlegm; pt said when he coughs he hurts on both sides of chest; pt has wheezing but no SOB. Pt took temp 98.1.not able to sleep at night due to coughing.pt has S/T and runny nose. Pt is not in any distress with breathing now. Pt said he will go to [facility] on [address] in [place] for eval and possible testing. FYI to Dr Para March.

## 2020-03-03 NOTE — Discharge Instructions (Addendum)
Chest xray shows some pulmonary vascular congestion, no pneumonia  I have sent in cough syrup for you to take. This medication can make you sleepy. Do not drive while taking this medication.  You have received a steroid injection in the office today  Your COVID and Flu tests are pending.  You should self quarantine until the test results are back.    Take Tylenol or ibuprofen as needed for fever or discomfort.  Rest and keep yourself hydrated.    Follow-up with your primary care provider if your symptoms are not improving.

## 2020-03-03 NOTE — ED Triage Notes (Signed)
Productive Cough x 3 days. Feels tired.

## 2020-03-04 LAB — COVID-19, FLU A+B NAA
Influenza A, NAA: NOT DETECTED
Influenza B, NAA: NOT DETECTED
SARS-CoV-2, NAA: NOT DETECTED

## 2020-03-06 NOTE — ED Provider Notes (Signed)
[place]   [id number] 03/03/20 Arrival Time: 4431   CC: COVID symptoms  SUBJECTIVE: History from: patient.  [name] is a 87 y.o. male who presents with fatigue and cough x 3 days. Denies sick exposure to COVID, flu or strep. Denies recent travel. Has negative history of Covid. Has completed Covid vaccines. Has not taken OTC medications for this. There are no aggravating or alleviating factors. Denies previous symptoms in the past. Denies fever, chills, sinus pain, rhinorrhea, sore throat, SOB, wheezing, chest pain, nausea, changes in bowel or bladder habits.    ROS: As per HPI.  All other pertinent ROS negative.     Past Medical History:  Diagnosis Date  . AAA (abdominal aortic aneurysm) ([place])    s/p stent graft repair 2012  . Arthritis    "right ankle" (04/17/2014)  . Atrial fibrillation ([place])    a. permanent;  b. Hematuria on Pradaxa;  c. cold intol and chills with Xarelto  . Bladder cancer ([place])   . Cardiomyopathy [facility])    a. Echo (03/2013): Inferior and inferolateral AK, EF 40-45%, mild MR, mod LAE, mod RAE, mod TR, mild PI, PASP 46  . Chronic kidney disease   . COPD (chronic obstructive pulmonary disease) ([place])   . Coronary artery disease s/p cabg x7  1992   a. s/p CABG in 1992 (X 7 - Dr. Redmond Pulling);  b.  Myoview (01/2011): Fixed inferior defect suggestive of prior MI versus diaphragmatic attenuation, apical reversibility possibly suspicious for small area of infarction with peri-infarct ischemia, no significant change from prior study, not gated, low risk study;  c.  Echo (05/2010): Mod LVH, EF 55-65%, mild AI, mild MR, moderate LAE, mild RAE, moderate   . Dysrhythmia   . Hematuria bladder tumor ---  followed by Dr Karsten Ro  . Hiatal hernia    repaired in 1992 w/[facility]  . Hx of cardiovascular stress test    Lexiscan Myoview (03/2013):  No ischemia; not gated.  . Hyperglycemia diet controlled  . Hyperlipidemia   . Hypertension   . Hypothyroidism   . Obesity   .  Organic impotence   . S/P AAA repair using bifurcation graft   . Sleep apnea no rx cpap   mild- tested  2012  . Ventricular ectopy    mild   Past Surgical History:  Procedure Laterality Date  . ABDOMINAL AORTIC ANEURYSM REPAIR  2012   using bifurcation graft  . CARDIAC CATHETERIZATION  03-1990   Positive  . CARDIOVASCULAR STRESS TEST  02-01-2011  NUCLEAR NO EXERCISE   LOW RISK STUDY.  SMALL APICAL DEFECT. NO EVIDENCE OF MULTIZONE ISCHEMIA.  Marland Kitchen CORONARY ARTERY BYPASS GRAFT  05/1990   CABG X 7  . ENDOVASCULAR STENT INSERTION  02/15/2011   Procedure: ENDOVASCULAR STENT GRAFT INSERTION;  Surgeon: Hinda Lenis, MD;  Location: [place];  Service: Vascular;  Laterality: N/A;  Insertion of endovascular stent graft   . ETT  10/31/00   WNL  . HIATAL HERNIA REPAIR  1992   "repaired when I had my OHS"  . Hospital - Afib  03/28/02  . INGUINAL HERNIA REPAIR Left 1997  . JOINT REPLACEMENT Right 04-17-14   Ankle  . TOTAL ANKLE ARTHROPLASTY Right 04/17/2014   Procedure: RIGHT TOTAL ANKLE ARTHOPLASTY;  Surgeon: Wylene Simmer, MD;  Location: [place];  Service: Orthopedics;  Laterality: Right;  . TOTAL ANKLE REPLACEMENT Right 04/17/2014  . TRANSURETHRAL RESECTION OF BLADDER TUMOR  03/25/2011   Procedure: TRANSURETHRAL RESECTION OF BLADDER  TUMOR (TURBT);  Surgeon: Claybon Jabs, MD;  Location: [facility];  Service: Urology;  Laterality: N/A;   Allergies  Allergen Reactions  . Dabigatran Etexilate Mesylate     Blood in urine  . Xarelto [Rivaroxaban] Shortness Of Breath and Other (See Comments)    SOB, CHILLS  . Metoprolol     Upset stomach   No current facility-administered medications on file prior to encounter.   Current Outpatient Medications on File Prior to Encounter  Medication Sig Dispense Refill  . amLODipine (NORVASC) 5 MG tablet Take 1 tablet (5 mg total) by mouth daily. 90 tablet 3  . atorvastatin (LIPITOR) 40 MG tablet TAKE 1 TABLET BY MOUTH ONCE DAILY. 90 tablet 3  .  clotrimazole (LOTRIMIN) 1 % cream Apply 1 application topically as needed (SKIN IRRITATION). 30 g 0  . ELIQUIS 5 MG TABS tablet TAKE 1 TABLET BY MOUTH TWICE (2) DAILY. MUST KEEP APPT FOR FUTURE REFILLS. 60 tablet 5  . levothyroxine (SYNTHROID) 137 MCG tablet 1 tab a day on an empty stomach 90 tablet 3  . ramipril (ALTACE) 5 MG capsule TAKE 1 CAPSULE BY MOUTH TWICE DAILY 180 capsule 3  . sildenafil (REVATIO) 20 MG tablet TAKE 3 TO 5 TABLETS BY MOUTH DAILY AS NEEDED 50 tablet 12   Social History   Socioeconomic History  . Marital status: Widowed    Spouse name: Not on file  . Number of children: Not on file  . Years of education: Not on file  . Highest education level: Not on file  Occupational History  . Occupation: retired Pension scheme manager.    Employer: retired    Comment: 2001. Likes to play golf . Travels with motor home  Tobacco Use  . Smoking status: Former Smoker    Packs/day: 1.25    Years: 30.00    Pack years: 37.50    Types: Cigarettes    Quit date: 02/28/1990    Years since quitting: 30.0  . Smokeless tobacco: Never Used  Vaping Use  . Vaping Use: Never used  Substance and Sexual Activity  . Alcohol use: No    Alcohol/week: 0.0 standard drinks    Comment: basically no alcohol use.  only very rare alcohol use.   . Drug use: No  . Sexual activity: Yes  Other Topics Concern  . Not on file  Social History Narrative   From [place]   Divorced, remarried then widowed, 3 children (from 65st marriage)   Likes to golf   Prev 4 sport athlete in [place], football fan.    Social Determinants of Health   Financial Resource Strain: Not on file  Food Insecurity: Not on file  Transportation Needs: Not on file  Physical Activity: Not on file  Stress: Not on file  Social Connections: Not on file  Intimate Partner Violence: Not on file   Family History  Problem Relation Age of Onset  . Obesity Mother 17       deceased  . Hypertension Mother   . Diabetes Mother   .  Heart disease Mother        before age 29  . Hyperlipidemia Mother   . Alcohol abuse Sister 2       deceased  . Other Father        deceased unknown  . Colon cancer Neg Hx   . Prostate cancer Neg Hx   . Anesthesia problems Neg Hx     OBJECTIVE:  Vitals:   03/03/20  1541 03/03/20 1545  BP: (!) 187/88   Pulse: 83   Resp: 16   Temp: 99.1 F (37.3 C)   TempSrc: Oral   SpO2: 97%   Weight:  250 lb (113.4 kg)  Height:  5\' 11"  (1.803 m)     General appearance: alert; appears fatigued, but nontoxic; speaking in full sentences and tolerating own secretions HEENT: NCAT; Ears: EACs clear, TMs pearly gray; Eyes: PERRL.  EOM grossly intact. Sinuses: nontender; Nose: nares patent with clear rhinorrhea, Throat: oropharynx erythematous, cobblestoning present, tonsils non erythematous or enlarged, uvula midline  Neck: supple without LAD Lungs: unlabored respirations, symmetrical air entry; cough: moderate; no respiratory distress; rhonchi noted throughout bilateral lung fields Heart: regular rate and rhythm.  Radial pulses 2+ symmetrical bilaterally Skin: warm and dry Psychological: alert and cooperative; normal mood and affect  LABS:  No results found for this or any previous visit (from the past 24 hour(s)).   ASSESSMENT & PLAN:  1. Viral illness   2. Cough   3. SOB (shortness of breath)   4. Rib pain   5. Pulmonary vascular congestion     Meds ordered this encounter  Medications  . dexamethasone (DECADRON) injection 10 mg  . guaiFENesin-codeine (CHERATUSSIN AC) 100-10 MG/5ML syrup    Sig: Take 5 mLs by mouth 3 (three) times daily as needed for cough.    Dispense:  120 mL    Refill:  0    Order Specific Question:   Supervising Provider    Answer:   Chase Picket [id number]   Xray shows pulmonary vascular congestion, no pneumonia Decadron 10mg  IM in office today Cheratussin syrup prescribed Sedation precautions given  Continue supportive care at home COVID and flu  testing ordered.  It will take between 2-3 days for test results. Someone will contact you regarding abnormal results.   Patient should remain in quarantine until they have received Covid results.  If negative you may resume normal activities (go back to work/school) while practicing hand hygiene, social distance, and mask wearing.  If positive, patient should remain in quarantine for at least 5 days from symptom onset AND greater than 72 hours after symptoms resolution (absence of fever without the use of fever-reducing medication and improvement in respiratory symptoms), whichever is longer Get plenty of rest and push fluids Use OTC zyrtec for nasal congestion, runny nose, and/or sore throat Use OTC flonase for nasal congestion and runny nose Use medications daily for symptom relief Use OTC medications like ibuprofen or tylenol as needed fever or pain Call or go to the ED if you have any new or worsening symptoms such as fever, worsening cough, shortness of breath, chest tightness, chest pain, turning blue, changes in mental status.  Reviewed expectations re: course of current medical issues. Questions answered. Outlined signs and symptoms indicating need for more acute intervention. Patient verbalized understanding. After Visit Summary given.         Faustino Congress, NP 03/06/20 1452

## 2020-07-20 ENCOUNTER — Telehealth: Payer: Self-pay | Admitting: *Deleted

## 2020-07-20 NOTE — Telephone Encounter (Signed)
Patient called stating that he thinks that he has covid. Patient stated that he started feeling bad Saturday evening. Patient stated that he felt so bad yesterday he was not able to get out of bed. Patient stated that he has a cough that is primarily dry, fever 99.5 and weakness. Patient stated that he does feel a little better today. Patient stated that his daughter came in from [place] and feels that is who he got it from because she is positive for covid now.  Patient stated that he has not been tested for covid but several of his other family members have tested poistive. Patient stated that he is going to get in touch with his daughter and see if she can help him with a home covid test and if not he will probably have her get him set up at [facility] for testing. Patient was given ER precautions and he verbalized understanding. Patient denies headache, SOB or difficulty breathing. Patient was given information to quarantine. Patient stated that he will call back and let Dr. Damita Dunnings know about him getting tested for covid.

## 2020-07-20 NOTE — Telephone Encounter (Signed)
I would get tested in the meantime as planned.  Please see about getting him on the schedule here at [facility] tomorrow for a virtual/phone visit.  If no slots on the schedule here at all, then let me know.  I wouldn't start treatment for covid w/o a positive test.  Supportive tx in the meantime.  If SOB, then to ER.  Thanks.

## 2020-07-21 NOTE — Telephone Encounter (Signed)
LMTCB

## 2020-07-21 NOTE — Telephone Encounter (Signed)
Spoke with patient and he did get covid tested and was positive. He has been taking vitamin D and zinc and states he has been feeling much better. Still has cough and sore throat and declines any treatment at this time. Advised to call back if gets worse or develops more sx.

## 2020-09-15 NOTE — Progress Notes (Signed)
Cardiology Office Note:    Date:  09/16/2020   ID:  Baker Pierini, DOB October 08, 1933, MRN [id number]  PCP:  Tonia Ghent, MD   [facility] Providers Cardiologist:  Sherren Mocha, MD Cardiology APP:  Sharmon [facility]vere     Referring MD: Tonia Ghent, MD   Chief Complaint:  Follow-up (AFib, CHF, CAD)    Patient Profile:    [name] is a 87 y.o. male with:  Coronary artery disease S/p CABG in 1992 Myoview 7/21: low risk  Systolic CHF Ischemic CM Echocardiogram 2/15: EF 40-45 Echocardiogram 7/21: EF 60-65 Permanent atrial fibrillation  CHA2DS2-VASc=5 (age x 2, HTN, CAD, CHF) >> Apixaban   Abdominal aortic aneurysm s/p EVAR Chronic kidney disease RBBB COPD Hypertension Hyperlipidemia  Dilated ascending aorta  Echocardiogram 7/21: 42 mm     Prior CV studies: Echocardiogram 09/24/19 EF 60-65, no RWMA, mild LVH, normal RVSF, severe LAE, mild RAE, mild MR, trivial AI, AV sclerosis w/o AS, Asc aorta 42 mm  GATED SPECT MYO PERF W/LEXISCAN STRESS 1D 09/24/2019 EF 60, diaph atten, no ischemia; low risk  Echo (03/2013): Inferior and inferolateral AK, EF 40-45%, mild MR, mod LAE, mod RAE, mod TR, mild PI, PASP 46   Lexiscan Myoview (03/2013):   No ischemia; not gated.   Myoview (01/2011): Fixed inferior defect suggestive of prior MI versus diaphragmatic attenuation, apical reversibility possibly suspicious for small area of infarction with peri-infarct ischemia, no significant change from prior study, not gated, low risk study.     Echo (05/2010): Moderate LVH, EF 55-65%, mild AI, mild MR, moderate LAE, mild RAE, moderate to severe TR, PASP 45.    History of Present Illness: Mr. Reinig was last seen in 08/2019.  He returns for f/u.  He is here alone.  He had COVID-19 several weeks ago.  He had significant fatigue with this for about a week and a half.  He has fully recovered now.  He has not had chest pain, shortness of breath, syncope.  He does have some  dependent pedal edema.  He has a lot of leg pain, especially with walking.  This is mainly joint pain.    Past Medical History:  Diagnosis Date   AAA (abdominal aortic aneurysm) ([place])    s/p stent graft repair 2012   Arthritis    "right ankle" (04/17/2014)   Atrial fibrillation ([place])    a. permanent;  b. Hematuria on Pradaxa;  c. cold intol and chills with Xarelto   Bladder cancer ([place])    Cardiomyopathy ([place])    a. Echo (03/2013): Inferior and inferolateral AK, EF 40-45%, mild MR, mod LAE, mod RAE, mod TR, mild PI, PASP 46   Chronic kidney disease    COPD (chronic obstructive pulmonary disease) (HCC)    Coronary artery disease s/p cabg x7  1992   a. s/p CABG in 1992 (X 7 - Dr. Redmond Pulling);  b.  Myoview (01/2011): Fixed inferior defect suggestive of prior MI versus diaphragmatic attenuation, apical reversibility possibly suspicious for small area of infarction with peri-infarct ischemia, no significant change from prior study, not gated, low risk study;  c.  Echo (05/2010): Mod LVH, EF 55-65%, mild AI, mild MR, moderate LAE, mild RAE, moderate    Dysrhythmia    Hematuria bladder tumor ---  followed by Dr Karsten Ro   Hiatal hernia    repaired in 1992 w/[facility]   Hx of cardiovascular stress test    Lexiscan Myoview (03/2013):  No ischemia; not gated.  Hyperglycemia diet controlled   Hyperlipidemia    Hypertension    Hypothyroidism    Obesity    Organic impotence    S/P AAA repair using bifurcation graft    Sleep apnea no rx cpap   mild- tested  2012   Ventricular ectopy    mild    Current Medications: Current Meds  Medication Sig   amLODipine (NORVASC) 5 MG tablet Take 1 tablet (5 mg total) by mouth daily.   atorvastatin (LIPITOR) 40 MG tablet TAKE 1 TABLET BY MOUTH ONCE DAILY.   clotrimazole (LOTRIMIN) 1 % cream Apply 1 application topically as needed (SKIN IRRITATION).   ELIQUIS 5 MG TABS tablet TAKE 1 TABLET BY MOUTH TWICE (2) DAILY. MUST KEEP APPT FOR FUTURE REFILLS.    guaiFENesin-codeine (CHERATUSSIN AC) 100-10 MG/5ML syrup Take 5 mLs by mouth 3 (three) times daily as needed for cough.   levothyroxine (SYNTHROID) 137 MCG tablet 1 tab a day on an empty stomach   ramipril (ALTACE) 5 MG capsule TAKE 1 CAPSULE BY MOUTH TWICE DAILY   sildenafil (REVATIO) 20 MG tablet TAKE 3 TO 5 TABLETS BY MOUTH DAILY AS NEEDED     Allergies:   Dabigatran etexilate mesylate, Xarelto [rivaroxaban], and Metoprolol   Social History   Tobacco Use   Smoking status: Former    Packs/day: 1.25    Years: 30.00    Pack years: 37.50    Types: Cigarettes    Quit date: 02/28/1990    Years since quitting: 30.5   Smokeless tobacco: Never  Vaping Use   Vaping Use: Never used  Substance Use Topics   Alcohol use: No    Alcohol/week: 0.0 standard drinks    Comment: basically no alcohol use.  only very rare alcohol use.    Drug use: No     Family Hx: The patient's family history includes Alcohol abuse (age of onset: 35) in his sister; Diabetes in his mother; Heart disease in his mother; Hyperlipidemia in his mother; Hypertension in his mother; Obesity (age of onset: 16) in his mother; Other in his father. There is no history of Colon cancer, Prostate cancer, or Anesthesia problems.  Review of Systems  Gastrointestinal:  Negative for hematochezia and melena.  Genitourinary:  Negative for hematuria.    EKGs/Labs/Other Test Reviewed:    EKG:  EKG is  ordered today.  The ekg ordered today demonstrates atrial fibrillation, HR 68, right bundle branch block, left axis deviation, QTC 487  Recent Labs: 01/30/2020: ALT 12; BUN 14; Creatinine, Ser 1.04; Hemoglobin 14.5; Platelets 214.0; Potassium 4.0; Sodium 140; TSH 1.54   Recent Lipid Panel Lab Results  Component Value Date/Time   CHOL 146 01/30/2020 09:04 AM   CHOL 151 09/24/2019 08:50 AM   TRIG 192.0 (H) 01/30/2020 09:04 AM   HDL 34.20 (L) 01/30/2020 09:04 AM   HDL 40 09/24/2019 08:50 AM   LDLCALC 73 01/30/2020 09:04 AM   LDLCALC  81 09/24/2019 08:50 AM   LDLDIRECT 99.0 03/21/2017 10:31 AM      Risk Assessment/Calculations:    CHA2DS2-VASc Score = 5  This indicates a 7.2% annual risk of stroke. The patient's score is based upon: CHF History: Yes HTN History: Yes Diabetes History: No Stroke History: No Vascular Disease History: Yes Age Score: 2 Gender Score: 0    Physical Exam:    VS:  BP (!) 130/58   Pulse 68   Ht 5\' 11"  (1.803 m)   Wt 265 lb 3.2 oz (120.3 kg)  SpO2 98%   BMI 36.99 kg/m     Wt Readings from Last 3 Encounters:  09/16/20 265 lb 3.2 oz (120.3 kg)  03/03/20 250 lb (113.4 kg)  02/06/20 262 lb 6.4 oz (119 kg)     Constitutional:      Appearance: Healthy appearance. Not in distress.  Neck:     Vascular: No carotid bruit. JVD normal.  Pulmonary:     Effort: Pulmonary effort is normal.     Breath sounds: No wheezing. No rales.  Cardiovascular:     Normal rate. Irregularly irregular rhythm. Normal S1. Normal S2.      Murmurs: There is no murmur.  Edema:    Peripheral edema present.    Pretibial: bilateral trace edema of the pretibial area. Abdominal:     Palpations: Abdomen is soft.  Skin:    General: Skin is warm and dry.  Neurological:     General: No focal deficit present.     Mental Status: Alert and oriented to person, place and time.     Cranial Nerves: Cranial nerves are intact.       ASSESSMENT & PLAN:    1. Coronary artery disease involving native coronary artery of native heart without angina pectoris History of CABG in 1992.  Myoview in 2021 was low risk.  He is not having anginal symptoms.  Continue current dose of atorvastatin.  He is not on aspirin as he is on Apixaban.  2. RBBB (right bundle branch block) Old.  Work-up last year included echocardiogram and Myoview which were both unremarkable.  3. HFimpEF (Heart Failure with Improved EF) EF on echocardiogram last year was 60-65.  No evidence of volume excess.  He is no longer on beta-blocker therapy for  unclear reasons.  Continue current dose of Ramipril.  4. Permanent atrial fibrillation (HCC) Rate is controlled.  He is tolerating anticoagulation.  Hemoglobin and creatinine normal in December 2021.  Continue current dose of Apixaban.  5. Essential hypertension The patient's blood pressure is controlled on his current regimen.  Continue current therapy.   6. Pure hypercholesterolemia Fair control of lipids.  Continue current dose of atorvastatin.  7. Ascending aorta dilation (HCC) 42 mm on echocardiogram last year.  Obtain follow-up echocardiogram to reassess.  8. Leg pain, bilateral I have difficulty feeling his pulses on exam.  I offered ABIs but he prefers to hold off for now.  We discussed symptoms that should prompt evaluation.   Dispo:  Return in about 1 year (around 09/16/2021) for Routine follow up in 1 year with Richardson Dopp, PA-C. .   Medication Adjustments/Labs and Tests Ordered: Current medicines are reviewed at length with the patient today.  Concerns regarding medicines are outlined above.  Tests Ordered: Orders Placed This Encounter  Procedures   EKG 12-Lead   ECHOCARDIOGRAM COMPLETE   Medication Changes: No orders of the defined types were placed in this encounter.   Signed, Richardson Dopp, PA-C  09/16/2020 10:21 AM    [facility] [address], [place], [place]  [address] Phone: [phone]; Fax: [phone]

## 2020-09-16 ENCOUNTER — Encounter: Payer: Self-pay | Admitting: Physician Assistant

## 2020-09-16 ENCOUNTER — Ambulatory Visit (INDEPENDENT_AMBULATORY_CARE_PROVIDER_SITE_OTHER): Payer: Medicare Other | Admitting: Physician Assistant

## 2020-09-16 ENCOUNTER — Other Ambulatory Visit: Payer: Self-pay

## 2020-09-16 VITALS — BP 130/58 | HR 68 | Ht 71.0 in | Wt 265.2 lb

## 2020-09-16 DIAGNOSIS — I5022 Chronic systolic (congestive) heart failure: Secondary | ICD-10-CM | POA: Diagnosis not present

## 2020-09-16 DIAGNOSIS — E7800 Pure hypercholesterolemia, unspecified: Secondary | ICD-10-CM

## 2020-09-16 DIAGNOSIS — M79605 Pain in left leg: Secondary | ICD-10-CM

## 2020-09-16 DIAGNOSIS — I2510 Atherosclerotic heart disease of native coronary artery without angina pectoris: Secondary | ICD-10-CM

## 2020-09-16 DIAGNOSIS — M79604 Pain in right leg: Secondary | ICD-10-CM

## 2020-09-16 DIAGNOSIS — I4510 Unspecified right bundle-branch block: Secondary | ICD-10-CM

## 2020-09-16 DIAGNOSIS — I77810 Thoracic aortic ectasia: Secondary | ICD-10-CM

## 2020-09-16 DIAGNOSIS — I4821 Permanent atrial fibrillation: Secondary | ICD-10-CM

## 2020-09-16 DIAGNOSIS — I10 Essential (primary) hypertension: Secondary | ICD-10-CM

## 2020-09-16 NOTE — Patient Instructions (Addendum)
Medication Instructions:   Your physician recommends that you continue on your current medications as directed. Please refer to the Current Medication list given to you today.  *If you need a refill on your cardiac medications before your next appointment, please call your pharmacy*  Lab Work:  -NONE  If you have labs (blood work) drawn today and your tests are completely normal, you will receive your results only by: [place] (if you have MyChart) OR A paper copy in the mail If you have any lab test that is abnormal or we need to change your treatment, we will call you to review the results.  Testing/Procedures: Your physician has requested that you have an echocardiogram. DX: dilated ascending aorta Echocardiography is a painless test that uses sound waves to create images of your heart. It provides your doctor with information about the size and shape of your heart and how well your heart's chambers and valves are working. This procedure takes approximately one hour. There are no restrictions for this procedure.Wednesday, October 12 @ 8:35 am.    Follow-Up: At [facility], you and your health needs are our priority.  As part of our continuing mission to provide you with exceptional heart care, we have created designated Provider Care Teams.  These Care Teams include your primary Cardiologist (physician) and Advanced Practice Providers (APPs -  Physician Assistants and Nurse Practitioners) who all work together to provide you with the care you need, when you need it.  We recommend signing up for the patient portal called "MyChart".  Sign up information is provided on this After Visit Summary.  MyChart is used to connect with patients for Virtual Visits (Telemedicine).  Patients are able to view lab/test results, encounter notes, upcoming appointments, etc.  Non-urgent messages can be sent to your provider as well.   To learn more about what you can do with MyChart, go to  [email or web address].    Your next appointment:   1 year(s)  The format for your next appointment:   In Person  Provider:   Richardson Dopp, PA-C   Other Instructions Your physician wants you to follow-up in: 1 Year with Richardson Dopp, PA-C.  You will receive a reminder letter in the mail two months in advance. If you don't receive a letter, please call our office to schedule the follow-up appointment.

## 2020-11-16 ENCOUNTER — Telehealth: Payer: Self-pay | Admitting: Family Medicine

## 2020-11-16 NOTE — Telephone Encounter (Signed)
LVM for pt to rtn my call to schedule AWV with [facility].  

## 2020-11-21 ENCOUNTER — Ambulatory Visit (INDEPENDENT_AMBULATORY_CARE_PROVIDER_SITE_OTHER): Payer: Medicare Other

## 2020-11-21 DIAGNOSIS — Z0000 Encounter for general adult medical examination without abnormal findings: Secondary | ICD-10-CM

## 2020-11-21 NOTE — Progress Notes (Signed)
Subjective:  I connected with  [name] on 11/21/20 by an audio only telemedicine application and verified that I am speaking with the correct person using two identifiers.   I discussed the limitations, risks, security and privacy concerns of performing an evaluation and management service by telephone and the availability of in person appointments. I also discussed with the patient that there may be a patient responsible charge related to this service. The patient expressed understanding and verbally consented to this telephonic visit.  Location of Patient: Home Location of Provider: Office  List any persons and their role that are participating in the visit with the patient. None  Review of Systems    Defer to PCP       Objective:    There were no vitals filed for this visit. There is no height or weight on file to calculate BMI.  Advanced Directives 11/21/2020 07/16/2018 12/16/2016 11/30/2015 05/16/2014 04/17/2014 04/07/2014  Does Patient Have a Medical Advance Directive? Yes Yes Yes Yes Yes No No  Type of Advance Directive - [place];Living will Living will [place];Living will Living will;Healthcare Power of Attorney - -  Does patient want to make changes to medical advance directive? Yes (Inpatient - patient defers changing a medical advance directive and declines information at this time) - - No - Patient declined No - Patient declined - -  Copy of [place] in Chart? - No - copy requested - No - copy requested No - copy requested - -  Would patient like information on creating a medical advance directive? - - - - - Yes - Educational materials given No - patient declined information  Pre-existing out of facility DNR order (yellow form or pink MOST form) - - - - - - -    Current Medications (verified) Outpatient Encounter Medications as of 11/21/2020  Medication Sig   amLODipine (NORVASC) 5 MG tablet Take 1 tablet (5  mg total) by mouth daily.   atorvastatin (LIPITOR) 40 MG tablet TAKE 1 TABLET BY MOUTH ONCE DAILY.   clotrimazole (LOTRIMIN) 1 % cream Apply 1 application topically as needed (SKIN IRRITATION).   ELIQUIS 5 MG TABS tablet TAKE 1 TABLET BY MOUTH TWICE (2) DAILY. MUST KEEP APPT FOR FUTURE REFILLS.   guaiFENesin-codeine (CHERATUSSIN AC) 100-10 MG/5ML syrup Take 5 mLs by mouth 3 (three) times daily as needed for cough.   levothyroxine (SYNTHROID) 137 MCG tablet 1 tab a day on an empty stomach   ramipril (ALTACE) 5 MG capsule TAKE 1 CAPSULE BY MOUTH TWICE DAILY   sildenafil (REVATIO) 20 MG tablet TAKE 3 TO 5 TABLETS BY MOUTH DAILY AS NEEDED   No facility-administered encounter medications on file as of 11/21/2020.    Allergies (verified) Dabigatran etexilate mesylate, Xarelto [rivaroxaban], and Metoprolol   History: Past Medical History:  Diagnosis Date   AAA (abdominal aortic aneurysm) ([place])    s/p stent graft repair 2012   Arthritis    "right ankle" (04/17/2014)   Atrial fibrillation ([place])    a. permanent;  b. Hematuria on Pradaxa;  c. cold intol and chills with Xarelto   Bladder cancer ([place])    Cardiomyopathy ([place])    a. Echo (03/2013): Inferior and inferolateral AK, EF 40-45%, mild MR, mod LAE, mod RAE, mod TR, mild PI, PASP 46   Chronic kidney disease    COPD (chronic obstructive pulmonary disease) ([facility])    Coronary artery disease s/p cabg x7  1992  a. s/p CABG in 1992 (X 7 - Dr. Redmond Pulling);  b.  Myoview (01/2011): Fixed inferior defect suggestive of prior MI versus diaphragmatic attenuation, apical reversibility possibly suspicious for small area of infarction with peri-infarct ischemia, no significant change from prior study, not gated, low risk study;  c.  Echo (05/2010): Mod LVH, EF 55-65%, mild AI, mild MR, moderate LAE, mild RAE, moderate    Dysrhythmia    Hematuria bladder tumor ---  followed by Dr Karsten Ro   Hiatal hernia    repaired in 1992 w/[facility]   Hx of cardiovascular stress test     Lexiscan Myoview (03/2013):  No ischemia; not gated.   Hyperglycemia diet controlled   Hyperlipidemia    Hypertension    Hypothyroidism    Obesity    Organic impotence    S/P AAA repair using bifurcation graft    Sleep apnea no rx cpap   mild- tested  2012   Ventricular ectopy    mild   Past Surgical History:  Procedure Laterality Date   ABDOMINAL AORTIC ANEURYSM REPAIR  2012   using bifurcation graft   CARDIAC CATHETERIZATION  03-1990   Positive   CARDIOVASCULAR STRESS TEST  02-01-2011  NUCLEAR NO EXERCISE   LOW RISK STUDY.  SMALL APICAL DEFECT. NO EVIDENCE OF MULTIZONE ISCHEMIA.   CORONARY ARTERY BYPASS GRAFT  05/1990   CABG X 7   ENDOVASCULAR STENT INSERTION  02/15/2011   Procedure: ENDOVASCULAR STENT GRAFT INSERTION;  Surgeon: Hinda Lenis, MD;  Location: [facility];  Service: Vascular;  Laterality: N/A;  Insertion of endovascular stent graft    ETT  10/31/00   WNL   HIATAL HERNIA REPAIR  1992   "repaired when I had my [facility]"   Hospital - Afib  03/28/02   INGUINAL HERNIA REPAIR Left 1997   JOINT REPLACEMENT Right 04-17-14   Ankle   TOTAL ANKLE ARTHROPLASTY Right 04/17/2014   Procedure: RIGHT TOTAL ANKLE ARTHOPLASTY;  Surgeon: Wylene Simmer, MD;  Location: [facility];  Service: Orthopedics;  Laterality: Right;   TOTAL ANKLE REPLACEMENT Right 04/17/2014   TRANSURETHRAL RESECTION OF BLADDER TUMOR  03/25/2011   Procedure: TRANSURETHRAL RESECTION OF BLADDER TUMOR (TURBT);  Surgeon: Claybon Jabs, MD;  Location[facility];  Service: Urology;  Laterality: N/A;   Family History  Problem Relation Age of Onset   Obesity Mother 78       deceased   Hypertension Mother    Diabetes Mother    Heart disease Mother        before age 34   Hyperlipidemia Mother    Alcohol abuse Sister 58       deceased   Other Father        deceased unknown   Colon cancer Neg Hx    Prostate cancer Neg Hx    Anesthesia problems Neg Hx    Social History   Socioeconomic History    Marital status: Widowed    Spouse name: Not on file   Number of children: Not on file   Years of education: Not on file   Highest education level: Not on file  Occupational History   Occupation: retired Pension scheme manager.    Employer: retired    Comment: 2001. Likes to play golf . Travels with motor home  Tobacco Use   Smoking status: Former    Packs/day: 1.25    Years: 30.00    Pack years: 37.50    Types: Cigarettes    Quit date:  02/28/1990    Years since quitting: 30.7   Smokeless tobacco: Never  Vaping Use   Vaping Use: Never used  Substance and Sexual Activity   Alcohol use: No    Alcohol/week: 0.0 standard drinks    Comment: basically no alcohol use.  only very rare alcohol use.    Drug use: No   Sexual activity: Yes  Other Topics Concern   Not on file  Social History Narrative   From [place]   Divorced, remarried then widowed, 3 children (from 1st marriage)   Likes to golf   Prev 4 sport athlete in [place], football fan.    Social Determinants of Health   Financial Resource Strain: Low Risk    Difficulty of Paying Living Expenses: Not hard at all  Food Insecurity: No Food Insecurity   Worried About Charity fundraiser in the Last Year: Never true   [place] in the Last Year: Never true  Transportation Needs: No Transportation Needs   Lack of Transportation (Medical): No   Lack of Transportation (Non-Medical): No  Physical Activity: Insufficiently Active   Days of Exercise per Week: 4 days   Minutes of Exercise per Session: 30 min  Stress: No Stress Concern Present   Feeling of Stress : Not at all  Social Connections: Moderately Isolated   Frequency of Communication with Friends and Family: More than three times a week   Frequency of Social Gatherings with Friends and Family: Three times a week   Attends Religious Services: Never   Active Member of Clubs or Organizations: No   Attends Music therapist: Never   Marital Status: Living with  partner    Tobacco Counseling Counseling given: Not Answered   Clinical Intake:  Pre-visit preparation completed: Yes  Pain : No/denies pain     Diabetes: No  How often do you need to have someone help you when you read instructions, pamphlets, or other written materials from your doctor or pharmacy?: 1 - Never  Diabetic?No  Interpreter Needed?: No      Activities of Daily Living In your present state of health, do you have any difficulty performing the following activities: 11/21/2020  Hearing? N  Vision? N  Difficulty concentrating or making decisions? N  Walking or climbing stairs? N  Comment Ankle replacement  Dressing or bathing? N  Doing errands, shopping? N  Preparing Food and eating ? N  Using the Toilet? N  In the past six months, have you accidently leaked urine? N  Do you have problems with loss of bowel control? N  Managing your Medications? N  Managing your Finances? N  Housekeeping or managing your Housekeeping? N  Some recent data might be hidden    Patient Care Team: Tonia Ghent, MD as PCP - General (Family Medicine) Sherren Mocha, MD as PCP - Cardiology (Cardiology) Kathie Rhodes, MD (Inactive) as Attending Physician (Urology) Larey Dresser, MD as Consulting Physician (Cardiology) Sharmon Revere as Physician Assistant (Cardiology)  Indicate any recent Medical Services you may have received from other than [facility] providers in the past year (date may be approximate).     Assessment:   This is a routine wellness examination for [name].  Hearing/Vision screen No results found.  Dietary issues and exercise activities discussed:     Goals Addressed   None    Depression Screen PHQ 2/9 Scores 11/21/2020 02/06/2020 07/16/2018 03/21/2017 11/30/2015 11/26/2014  PHQ - 2 Score 0 0 0 0 0  0  PHQ- 9 Score - - 0 - - -    Fall Risk Fall Risk  11/21/2020 02/06/2020 07/16/2018 03/21/2017 11/30/2015  Falls in the past year? 0 0 0 No No   Number falls in past yr: 0 0 - - -  Injury with Fall? 0 0 - - -  Follow up - Falls evaluation completed - - -    FALL RISK PREVENTION PERTAINING TO THE HOME:  Any stairs in or around the home? No If so, are there any without handrails? No  Home free of loose throw rugs in walkways, pet beds, electrical cords, etc? Yes  Adequate lighting in your home to reduce risk of falls? Yes   ASSISTIVE DEVICES UTILIZED TO PREVENT FALLS:  Life alert? No  Use of a cane, walker or w/c? No  Grab bars in the bathroom? No  Shower chair or bench in shower? Yes  Elevated toilet seat or a handicapped toilet? No   TIMED UP AND GO:  Was the test performed?  N/A .  Length of time to ambulate 10 feet: N/A sec.   These questions cannot be answered via Telephone Encounter.  Cognitive Function: MMSE - Mini Mental State Exam 07/16/2018 11/30/2015  Orientation to time 5 5  Orientation to Place 5 5  Registration 3 3  Attention/ Calculation 0 0  Recall 3 2  Recall-comments - pt was unable to recall 1 of 3 words  Language- name 2 objects 0 0  Language- repeat 1 1  Language- follow 3 step command 0 3  Language- read & follow direction 0 0  Write a sentence 0 0  Copy design 0 0  Total score 17 19     6CIT Screen 11/21/2020  What Year? 0 points  What month? 0 points  What time? 0 points  Count back from 20 0 points  Months in reverse 0 points  Repeat phrase 2 points  Total Score 2    Immunizations Immunization History  Administered Date(s) Administered   Fluad Quad(high Dose 65+) 02/06/2020   Influenza Split 12/28/2010, 12/29/2011   Influenza Whole 01/09/2006, 12/18/2008, 10/27/2009   Influenza,inj,Quad PF,6+ Mos 12/11/2012, 11/22/2013, 11/26/2014, 11/30/2015, 11/29/2018   Influenza-Unspecified 01/28/2017, 11/28/2017   PFIZER(Purple Top)SARS-COV-2 Vaccination 04/13/2019, 05/08/2019, 01/06/2020   Pneumococcal Conjugate-13 11/26/2014   Pneumococcal Polysaccharide-23 03/01/1999, 12/28/2010    Td 11/28/1996, 09/27/2006   Zoster, Live 04/29/2010    TDAP status: Due, Education has been provided regarding the importance of this vaccine. Advised may receive this vaccine at local pharmacy or Health Dept. Aware to provide a copy of the vaccination record if obtained from local pharmacy or Health Dept. Verbalized acceptance and understanding.  Flu Vaccine status: Due, Education has been provided regarding the importance of this vaccine. Advised may receive this vaccine at local pharmacy or Health Dept. Aware to provide a copy of the vaccination record if obtained from local pharmacy or Health Dept. Verbalized acceptance and understanding.  Pneumococcal vaccine status: Up to date  Covid-19 vaccine status: Completed vaccines  Qualifies for Shingles Vaccine? Yes   Zostavax completed No   Shingrix Completed?: Yes  Screening Tests Health Maintenance  Topic Date Due   Zoster Vaccines- Shingrix (1 of 2) Never done   TETANUS/TDAP  09/26/2016   COVID-19 Vaccine (4 - Booster for Pfizer series) 03/30/2020   INFLUENZA VACCINE  09/28/2020   HPV VACCINES  Aged Out    Health Maintenance  Health Maintenance Due  Topic Date Due   Zoster Vaccines- Shingrix (  1 of 2) Never done   TETANUS/TDAP  09/26/2016   COVID-19 Vaccine (4 - Booster for Pfizer series) 03/30/2020   INFLUENZA VACCINE  09/28/2020    Colorectal cancer screening: No longer required.   Lung Cancer Screening: (Low Dose CT Chest recommended if Age 74-80 years, 30 pack-year currently smoking OR have quit w/in 15years.) does not qualify.   Lung Cancer Screening Referral: No  Additional Screening:  Hepatitis C Screening: does qualify; Completed No  Vision Screening: Recommended annual ophthalmology exams for early detection of glaucoma and other disorders of the eye. Is the patient up to date with their annual eye exam?  Yes  Who is the provider or what is the name of the office in which the patient attends annual eye exams?  Dr Katy Fitch If pt is not established with a provider, would they like to be referred to a provider to establish care? No .   Dental Screening: Recommended annual dental exams for proper oral hygiene  Community Resource Referral / Chronic Care Management: CRR required this visit?  No   CCM required this visit?  No      Plan:     I have personally reviewed and noted the following in the patient's chart:   Medical and social history Use of alcohol, tobacco or illicit drugs  Current medications and supplements including opioid prescriptions. Patient is not currently taking opioid prescriptions. Functional ability and status Nutritional status Physical activity Advanced directives List of other physicians Hospitalizations, surgeries, and ER visits in previous 12 months Vitals Screenings to include cognitive, depression, and falls Referrals and appointments  In addition, I have reviewed and discussed with patient certain preventive protocols, quality metrics, and best practice recommendations. A written personalized care plan for preventive services as well as general preventive health recommendations were provided to patient.     [name], [place]   11/21/2020   Nurse Notes: Non Face to Face 45 minute visit encounter   [name] , Thank you for taking time to come for your Medicare Wellness Visit. I appreciate your ongoing commitment to your health goals. Please review the following plan we discussed and let me know if I can assist you in the future.   These are the goals we discussed:  Goals      Patient Stated     Starting 07/16/18, I will continue to take medications as prescribed.         This is a list of the screening recommended for you and due dates:  Health Maintenance  Topic Date Due   Zoster (Shingles) Vaccine (1 of 2) Never done   Tetanus Vaccine  09/26/2016   COVID-19 Vaccine (4 - Booster for Pfizer series) 03/30/2020   Flu Shot  09/28/2020   HPV  Vaccine  Aged Out

## 2020-12-08 ENCOUNTER — Other Ambulatory Visit: Payer: Self-pay | Admitting: Cardiovascular Disease

## 2020-12-08 NOTE — Telephone Encounter (Signed)
Pt last saw Richardson Dopp, PA on 09/16/20, last labs 01/30/20 Creat 1.04, age 87, weight 120.3kg, based on specified criteria pt is on appropriate dosage of Eliquis 5mg  BID for afib.  Will refill rx.

## 2020-12-09 ENCOUNTER — Ambulatory Visit (HOSPITAL_COMMUNITY): Payer: Medicare Other | Attending: Cardiology

## 2020-12-09 ENCOUNTER — Encounter: Payer: Self-pay | Admitting: Physician Assistant

## 2020-12-09 ENCOUNTER — Other Ambulatory Visit: Payer: Self-pay

## 2020-12-09 DIAGNOSIS — I5022 Chronic systolic (congestive) heart failure: Secondary | ICD-10-CM | POA: Diagnosis not present

## 2020-12-09 DIAGNOSIS — I4821 Permanent atrial fibrillation: Secondary | ICD-10-CM | POA: Diagnosis not present

## 2020-12-09 DIAGNOSIS — M79604 Pain in right leg: Secondary | ICD-10-CM | POA: Insufficient documentation

## 2020-12-09 DIAGNOSIS — I2510 Atherosclerotic heart disease of native coronary artery without angina pectoris: Secondary | ICD-10-CM | POA: Insufficient documentation

## 2020-12-09 DIAGNOSIS — I10 Essential (primary) hypertension: Secondary | ICD-10-CM | POA: Insufficient documentation

## 2020-12-09 DIAGNOSIS — I4510 Unspecified right bundle-branch block: Secondary | ICD-10-CM | POA: Insufficient documentation

## 2020-12-09 DIAGNOSIS — E7800 Pure hypercholesterolemia, unspecified: Secondary | ICD-10-CM | POA: Insufficient documentation

## 2020-12-09 DIAGNOSIS — M79605 Pain in left leg: Secondary | ICD-10-CM | POA: Insufficient documentation

## 2020-12-09 DIAGNOSIS — I77810 Thoracic aortic ectasia: Secondary | ICD-10-CM | POA: Insufficient documentation

## 2020-12-09 LAB — ECHOCARDIOGRAM COMPLETE
AR max vel: 1.8 cm2
AV Area VTI: 1.75 cm2
AV Area mean vel: 1.74 cm2
AV Mean grad: 8 mmHg
AV Peak grad: 14.7 mmHg
Ao pk vel: 1.92 m/s
Area-P 1/2: 5.38 cm2
S' Lateral: 3.9 cm

## 2020-12-14 ENCOUNTER — Other Ambulatory Visit: Payer: Self-pay | Admitting: *Deleted

## 2020-12-14 DIAGNOSIS — I255 Ischemic cardiomyopathy: Secondary | ICD-10-CM

## 2020-12-14 DIAGNOSIS — I77810 Thoracic aortic ectasia: Secondary | ICD-10-CM

## 2020-12-26 DIAGNOSIS — Z23 Encounter for immunization: Secondary | ICD-10-CM | POA: Diagnosis not present

## 2021-03-10 ENCOUNTER — Other Ambulatory Visit: Payer: Self-pay | Admitting: Family Medicine

## 2021-04-28 DIAGNOSIS — R3912 Poor urinary stream: Secondary | ICD-10-CM | POA: Diagnosis not present

## 2021-04-28 DIAGNOSIS — N401 Enlarged prostate with lower urinary tract symptoms: Secondary | ICD-10-CM | POA: Diagnosis not present

## 2021-04-28 DIAGNOSIS — Z8551 Personal history of malignant neoplasm of bladder: Secondary | ICD-10-CM | POA: Diagnosis not present

## 2021-04-28 DIAGNOSIS — R310 Gross hematuria: Secondary | ICD-10-CM | POA: Diagnosis not present

## 2021-04-28 DIAGNOSIS — R351 Nocturia: Secondary | ICD-10-CM | POA: Diagnosis not present

## 2021-04-30 ENCOUNTER — Other Ambulatory Visit: Payer: Self-pay | Admitting: Family Medicine

## 2021-04-30 NOTE — Telephone Encounter (Signed)
Refill request for ramipril 5 mg caps ? ?LOV - 02/06/20 ?Next OV - not scheduled yet; message sent to schedule appt ?Last refill - 02/06/20 #180/3 ? ?

## 2021-05-02 NOTE — Telephone Encounter (Signed)
Sent. Thanks.  ?Please schedule yearly visit.   ?

## 2021-05-05 NOTE — Telephone Encounter (Signed)
Pt stated he have other other appt. And he will call next month to schedule his cpe/lab pt also stated he was waiting on a call from dr Damita Dunnings .  ?

## 2021-05-13 DIAGNOSIS — N3289 Other specified disorders of bladder: Secondary | ICD-10-CM | POA: Diagnosis not present

## 2021-05-13 DIAGNOSIS — K449 Diaphragmatic hernia without obstruction or gangrene: Secondary | ICD-10-CM | POA: Diagnosis not present

## 2021-05-13 DIAGNOSIS — N281 Cyst of kidney, acquired: Secondary | ICD-10-CM | POA: Diagnosis not present

## 2021-05-13 DIAGNOSIS — K5730 Diverticulosis of large intestine without perforation or abscess without bleeding: Secondary | ICD-10-CM | POA: Diagnosis not present

## 2021-05-13 DIAGNOSIS — R310 Gross hematuria: Secondary | ICD-10-CM | POA: Diagnosis not present

## 2021-05-25 DIAGNOSIS — Z8551 Personal history of malignant neoplasm of bladder: Secondary | ICD-10-CM | POA: Diagnosis not present

## 2021-05-25 DIAGNOSIS — R3912 Poor urinary stream: Secondary | ICD-10-CM | POA: Diagnosis not present

## 2021-05-25 DIAGNOSIS — C786 Secondary malignant neoplasm of retroperitoneum and peritoneum: Secondary | ICD-10-CM | POA: Diagnosis not present

## 2021-05-25 DIAGNOSIS — R310 Gross hematuria: Secondary | ICD-10-CM | POA: Diagnosis not present

## 2021-05-25 DIAGNOSIS — N401 Enlarged prostate with lower urinary tract symptoms: Secondary | ICD-10-CM | POA: Diagnosis not present

## 2021-05-26 ENCOUNTER — Other Ambulatory Visit (HOSPITAL_COMMUNITY): Payer: Self-pay | Admitting: Urology

## 2021-05-26 ENCOUNTER — Encounter: Payer: Self-pay | Admitting: *Deleted

## 2021-05-26 ENCOUNTER — Other Ambulatory Visit: Payer: Self-pay | Admitting: Urology

## 2021-05-26 DIAGNOSIS — C786 Secondary malignant neoplasm of retroperitoneum and peritoneum: Secondary | ICD-10-CM

## 2021-05-26 NOTE — Progress Notes (Unsigned)
Suttle, Rosanne Ashing, MD  Roosvelt Maser ?Approved for CT guided left iliac lymph node biopsy (CT 05/13/21, series 2 image 75).  May be prudent do do CT IVP given proximity of left ureter.  ? ?[place]   ?  ?   ?Previous Messages ?  ?----- Message -----  ?From: Roosvelt Maser  ?Sent: 05/26/2021  11:28 AM EDT  ?To: [facility] Procedure Requests  ?Subject: CT Biopsy                                      ? ? ? ?Ct Biopsy  ? ?Ct biopsy of pelvic/RP lymph node/ CT done 3/16 @ [facility] should be in PACS  ? ? ?Dr. Rexene Alberts  ?[phone]   ?

## 2021-05-27 ENCOUNTER — Encounter: Payer: Self-pay | Admitting: *Deleted

## 2021-05-27 NOTE — Telephone Encounter (Signed)
Patient scheduled appt for 06/22/21 ?

## 2021-05-27 NOTE — Progress Notes (Unsigned)
Sherren Mocha, MD  Roosvelt Maser ?Yes this is fine, thanks   ?  ?   ?Previous Messages ?  ?----- Message -----  ?From: Roosvelt Maser  ?Sent: 05/27/2021  12:15 PM EDT  ?To: Sherren Mocha, MD  ?Subject: Eliquis                                        ? ? ? ?Dr. Burt Knack,  ? ?This patient will need to hold his Eliquis for 48hrs prior to having a CT Iliac Lymph Node biopsy that was ordered by Dr. Rexene Alberts from [facility]  ? ?Can he hold this bloodthinner?  ? ?Thank you,  ?Vivien Rota  ?Team Lead  ?Centralized Scheduling  ?[place]  ?

## 2021-06-01 ENCOUNTER — Other Ambulatory Visit: Payer: Self-pay | Admitting: Student

## 2021-06-02 ENCOUNTER — Ambulatory Visit (HOSPITAL_COMMUNITY)
Admission: RE | Admit: 2021-06-02 | Discharge: 2021-06-02 | Disposition: A | Discharge status: Home / Self Care | Payer: Medicare Other | Source: Ambulatory Visit | Attending: Urology | Admitting: Urology

## 2021-06-02 ENCOUNTER — Other Ambulatory Visit: Payer: Self-pay

## 2021-06-02 DIAGNOSIS — Z7901 Long term (current) use of anticoagulants: Secondary | ICD-10-CM | POA: Diagnosis not present

## 2021-06-02 DIAGNOSIS — R351 Nocturia: Secondary | ICD-10-CM | POA: Insufficient documentation

## 2021-06-02 DIAGNOSIS — C775 Secondary and unspecified malignant neoplasm of intrapelvic lymph nodes: Secondary | ICD-10-CM | POA: Insufficient documentation

## 2021-06-02 DIAGNOSIS — R3914 Feeling of incomplete bladder emptying: Secondary | ICD-10-CM | POA: Insufficient documentation

## 2021-06-02 DIAGNOSIS — C786 Secondary malignant neoplasm of retroperitoneum and peritoneum: Secondary | ICD-10-CM | POA: Diagnosis not present

## 2021-06-02 DIAGNOSIS — I4891 Unspecified atrial fibrillation: Secondary | ICD-10-CM | POA: Diagnosis not present

## 2021-06-02 DIAGNOSIS — Z951 Presence of aortocoronary bypass graft: Secondary | ICD-10-CM | POA: Diagnosis not present

## 2021-06-02 DIAGNOSIS — N401 Enlarged prostate with lower urinary tract symptoms: Secondary | ICD-10-CM | POA: Diagnosis not present

## 2021-06-02 DIAGNOSIS — R590 Localized enlarged lymph nodes: Secondary | ICD-10-CM | POA: Diagnosis not present

## 2021-06-02 DIAGNOSIS — Z8551 Personal history of malignant neoplasm of bladder: Secondary | ICD-10-CM | POA: Insufficient documentation

## 2021-06-02 DIAGNOSIS — C801 Malignant (primary) neoplasm, unspecified: Secondary | ICD-10-CM | POA: Diagnosis not present

## 2021-06-02 LAB — CBC
HCT: 47.5 % (ref 39.0–52.0)
Hemoglobin: 15.4 g/dL (ref 13.0–17.0)
MCH: 29.7 pg (ref 26.0–34.0)
MCHC: 32.4 g/dL (ref 30.0–36.0)
MCV: 91.5 fL (ref 80.0–100.0)
Platelets: 205 10*3/uL (ref 150–400)
RBC: 5.19 MIL/uL (ref 4.22–5.81)
RDW: 13.5 % (ref 11.5–15.5)
WBC: 6.8 10*3/uL (ref 4.0–10.5)
nRBC: 0 % (ref 0.0–0.2)

## 2021-06-02 LAB — PROTIME-INR
INR: 1.2 (ref 0.8–1.2)
Prothrombin Time: 14.7 seconds (ref 11.4–15.2)

## 2021-06-02 MED ORDER — SODIUM CHLORIDE 0.9 % IV SOLN: INTRAVENOUS | Status: DC

## 2021-06-02 MED ORDER — LIDOCAINE HCL 1 % IJ SOLN
INTRAMUSCULAR | Status: AC
  Filled 2021-06-02: qty 10

## 2021-06-02 MED ORDER — FENTANYL CITRATE (PF) 100 MCG/2ML IJ SOLN
INTRAMUSCULAR | Status: AC
  Filled 2021-06-02: qty 2

## 2021-06-02 MED ORDER — MIDAZOLAM HCL 2 MG/2ML IJ SOLN
INTRAMUSCULAR | Status: AC
  Filled 2021-06-02: qty 2

## 2021-06-02 MED ORDER — FENTANYL CITRATE (PF) 100 MCG/2ML IJ SOLN
INTRAMUSCULAR | Status: AC | PRN
  Administered 2021-06-02: 25 ug via INTRAVENOUS

## 2021-06-02 MED ORDER — MIDAZOLAM HCL 2 MG/2ML IJ SOLN
INTRAMUSCULAR | Status: AC | PRN
  Administered 2021-06-02: 1 mg via INTRAVENOUS

## 2021-06-02 NOTE — H&P (Signed)
? ?Chief Complaint: ?Patient was seen in consultation today for neoplasm of peritoneum and retroperitoneum at the request of Gay,Matthew R ? ?Referring Physician(s): ?Gay,Matthew R ? ?Supervising Physician: Corrie Mckusick ? ?Patient Status: [facility] - Out-pt ? ?History of Present Illness: ?[name] is a 88 y.o. male who has been under care of Dr. Abner Greenspan for gross hematuria, BPH,  and bladder cancer s/p TURBT.  Additionally, he has a significant cardiac history s/p CABG, on Eliquis for A. Fib.  CT 05/13/21 revealed enlarged prominent pelvic and retroperitoneal lymph nodes, though nonspecific, are suspicious for metastatic nodal involvement.  He reports increased difficulty voiding with sensation of incomplete emptying and nocturia.  He endorses pain on bottom of right foot with walking.  He denies fever, chills, sweats, nausea, vomiting, abdominal and flank pain, gross hematuria, or painful urination. ? ?Past Medical History:  ?Diagnosis Date  ? AAA (abdominal aortic aneurysm)   ? s/p stent graft repair 2012  ? Arthritis   ? "right ankle" (04/17/2014)  ? Ascending aorta dilatation (HCC)   ? Echo 2021: 42 mm // Echocardiogram 10/22: EF 55-60, no RWMA, mildly reduced RVSF, RVSP 39.7, severe BAE, trivial MR, mod TR, trivial AI, mild AS (mean 8), ascending aorta 40 mm  ? Atrial fibrillation ([place])   ? a. permanent;  b. Hematuria on Pradaxa;  c. cold intol and chills with Xarelto  ? Bladder cancer ([place])   ? Cardiomyopathy ([place])   ? a. Echo (03/2013): Inferior and inferolateral AK, EF 40-45%, mild MR, mod LAE, mod RAE, mod TR, mild PI, PASP 46  ? Chronic kidney disease   ? COPD (chronic obstructive pulmonary disease) ([place])   ? Coronary artery disease s/p cabg x7  1992  ? a. s/p CABG in 1992 (X 7 - Dr. Redmond Pulling);  b.  Myoview (01/2011): Fixed inferior defect suggestive of prior MI versus diaphragmatic attenuation, apical reversibility possibly suspicious for small area of infarction with peri-infarct ischemia, no significant  change from prior study, not gated, low risk study;  c.  Echo (05/2010): Mod LVH, EF 55-65%, mild AI, mild MR, moderate LAE, mild RAE, moderate   ? Dysrhythmia   ? Hematuria bladder tumor ---  followed by Dr Karsten Ro  ? Hiatal hernia   ? repaired in 1992 w/[facility]  ? Hx of cardiovascular stress test   ? Lexiscan Myoview (03/2013):  No ischemia; not gated.  ? Hyperglycemia diet controlled  ? Hyperlipidemia   ? Hypertension   ? Hypothyroidism   ? Obesity   ? Organic impotence   ? S/P AAA repair using bifurcation graft   ? Sleep apnea no rx cpap  ? mild- tested  2012  ? Ventricular ectopy   ? mild  ? ? ?Past Surgical History:  ?Procedure Laterality Date  ? ABDOMINAL AORTIC ANEURYSM REPAIR  2012  ? using bifurcation graft  ? CARDIAC CATHETERIZATION  03-1990  ? Positive  ? CARDIOVASCULAR STRESS TEST  02-01-2011  NUCLEAR NO EXERCISE  ? LOW RISK STUDY.  SMALL APICAL DEFECT. NO EVIDENCE OF MULTIZONE ISCHEMIA.  ? CORONARY ARTERY BYPASS GRAFT  05/1990  ? CABG X 7  ? ENDOVASCULAR STENT INSERTION  02/15/2011  ? Procedure: ENDOVASCULAR STENT GRAFT INSERTION;  Surgeon: Hinda Lenis, MD;  Location: [place];  Service: Vascular;  Laterality: N/A;  Insertion of endovascular stent graft   ? ETT  10/31/00  ? WNL  ? HIATAL HERNIA REPAIR  1992  ? "repaired when I had my [facility]"  ? Hospital -  Afib  03/28/02  ? INGUINAL HERNIA REPAIR Left 1997  ? JOINT REPLACEMENT Right 04-17-14  ? Ankle  ? TOTAL ANKLE ARTHROPLASTY Right 04/17/2014  ? Procedure: RIGHT TOTAL ANKLE ARTHOPLASTY;  Surgeon: Wylene Simmer, MD;  Location: [place];  Service: Orthopedics;  Laterality: Right;  ? TOTAL ANKLE REPLACEMENT Right 04/17/2014  ? TRANSURETHRAL RESECTION OF BLADDER TUMOR  03/25/2011  ? Procedure: TRANSURETHRAL RESECTION OF BLADDER TUMOR (TURBT);  Surgeon: Claybon Jabs, MD;  [facility];  Service: Urology;  Laterality: N/A;  ? ? ?Allergies: ?Dabigatran etexilate mesylate, Xarelto [rivaroxaban], and Metoprolol ? ?Medications: ?Prior to Admission  medications   ?Medication Sig Start Date End Date Taking? Authorizing Provider  ?atorvastatin (LIPITOR) 40 MG tablet TAKE 1 TABLET BY MOUTH ONCE A DAY 03/10/21  Yes Tonia Ghent, MD  ?ELIQUIS 5 MG TABS tablet TAKE 1 TABLET BY MOUTH TWICE (2) DAILY 12/08/20  Yes Sherren Mocha, MD  ?finasteride (PROSCAR) 5 MG tablet Take 5 mg by mouth daily.   Yes [provider]  ?levothyroxine (SYNTHROID) 137 MCG tablet TAKE 1 TABLET BY MOUTH ONCE A DAY ON AN EMPTY STOMACH 03/10/21  Yes Tonia Ghent, MD  ?ramipril (ALTACE) 5 MG capsule TAKE 1 CAPSULE BY MOUTH TWICE DAILY 05/02/21  Yes Tonia Ghent, MD  ?sildenafil (REVATIO) 20 MG tablet TAKE 3 TO 5 TABLETS BY MOUTH DAILY AS NEEDED 02/06/20  Yes Tonia Ghent, MD  ?tamsulosin (FLOMAX) 0.4 MG CAPS capsule Take 0.4 mg by mouth at bedtime.   Yes [provider]  ?  ? ?Family History  ?Problem Relation Age of Onset  ? Obesity Mother 91  ?     deceased  ? Hypertension Mother   ? Diabetes Mother   ? Heart disease Mother   ?     before age 76  ? Hyperlipidemia Mother   ? Alcohol abuse Sister 37  ?     deceased  ? Other Father   ?     deceased unknown  ? Colon cancer Neg Hx   ? Prostate cancer Neg Hx   ? Anesthesia problems Neg Hx   ? ? ?Social History  ? ?Socioeconomic History  ? Marital status: Widowed  ?  Spouse name: Not on file  ? Number of children: Not on file  ? Years of education: Not on file  ? Highest education level: Not on file  ?Occupational History  ? Occupation: retired Pension scheme manager.  ?  Employer: retired  ?  Comment: 2001. Likes to play golf . Travels with motor home  ?Tobacco Use  ? Smoking status: Former  ?  Packs/day: 1.25  ?  Years: 30.00  ?  Pack years: 37.50  ?  Types: Cigarettes  ?  Quit date: 02/28/1990  ?  Years since quitting: 31.2  ? Smokeless tobacco: Never  ?Vaping Use  ? Vaping Use: Never used  ?Substance and Sexual Activity  ? Alcohol use: No  ?  Alcohol/week: 0.0 standard drinks  ?  Comment: basically no alcohol use.  only  very rare alcohol use.   ? Drug use: No  ? Sexual activity: Yes  ?Other Topics Concern  ? Not on file  ?Social History Narrative  ? From [place]  ? Divorced, remarried then widowed, 3 children (from 1st marriage)  ? Likes to golf  ? Prev 4 sport athlete in HS, football fan.   ? ?Social Determinants of Health  ? ?Financial Resource Strain: Low Risk   ?  Difficulty of Paying Living Expenses: Not hard at all  ?Food Insecurity: No Food Insecurity  ? Worried About Charity fundraiser in the Last Year: Never true  ? Ran Out of Food in the Last Year: Never true  ?Transportation Needs: No Transportation Needs  ? Lack of Transportation (Medical): No  ? Lack of Transportation (Non-Medical): No  ?Physical Activity: Insufficiently Active  ? Days of Exercise per Week: 4 days  ? Minutes of Exercise per Session: 30 min  ?Stress: No Stress Concern Present  ? Feeling of Stress : Not at all  ?Social Connections: Moderately Isolated  ? Frequency of Communication with Friends and Family: More than three times a week  ? Frequency of Social Gatherings with Friends and Family: Three times a week  ? Attends Religious Services: Never  ? Active Member of Clubs or Organizations: No  ? Attends Archivist Meetings: Never  ? Marital Status: Living with partner  ? ? ?Review of Systems: A 12 point ROS discussed and pertinent positives are indicated in the HPI above.  All other systems are negative. ? ?Review of Systems  ?Constitutional: Negative.   ?Cardiovascular: Negative.   ?Gastrointestinal: Negative.   ?Genitourinary:  Positive for difficulty urinating.  ?Musculoskeletal:  Positive for gait problem.  ?Hematological:  Bruises/bleeds easily.  ?All other systems reviewed and are negative. ? ?Vital Signs: ?BP (!) 170/106   Pulse 78   Temp 97.9 ?F (36.6 ?C) (Oral)   Resp 16   Ht '5\' 11"'$  (1.803 m)   Wt 260 lb (117.9 kg)   SpO2 96%   BMI 36.26 kg/m?  ? ?Physical Exam ?Vitals reviewed.  ?Constitutional:   ?   General: He is not in  acute distress. ?   Appearance: He is not ill-appearing.  ?HENT:  ?   Head: Normocephalic and atraumatic.  ?   Mouth/Throat:  ?   Mouth: Mucous membranes are moist.  ?   Pharynx: Oropharynx is clear.  ?Eyes:  ?

## 2021-06-06 ENCOUNTER — Other Ambulatory Visit: Payer: Self-pay | Admitting: Family Medicine

## 2021-06-06 DIAGNOSIS — E7800 Pure hypercholesterolemia, unspecified: Secondary | ICD-10-CM

## 2021-06-06 DIAGNOSIS — I4891 Unspecified atrial fibrillation: Secondary | ICD-10-CM

## 2021-06-06 NOTE — Addendum Note (Signed)
Addended by: Tonia Ghent on: 06/06/2021 05:54 PM ? ? Modules accepted: Orders ? ?

## 2021-06-07 LAB — SURGICAL PATHOLOGY

## 2021-06-09 ENCOUNTER — Other Ambulatory Visit: Payer: Self-pay | Admitting: Urology

## 2021-06-09 ENCOUNTER — Other Ambulatory Visit (HOSPITAL_COMMUNITY): Payer: Self-pay | Admitting: Urology

## 2021-06-09 DIAGNOSIS — C61 Malignant neoplasm of prostate: Secondary | ICD-10-CM

## 2021-06-16 ENCOUNTER — Telehealth: Payer: Self-pay | Admitting: Physician Assistant

## 2021-06-16 DIAGNOSIS — N401 Enlarged prostate with lower urinary tract symptoms: Secondary | ICD-10-CM | POA: Diagnosis not present

## 2021-06-16 NOTE — Telephone Encounter (Signed)
? ?  Name: [name]  ?DOB: [birth year]  ?MRN: [id number] ? ?Primary Cardiologist: Sherren Mocha, MD ? ?Chart reviewed as part of pre-operative protocol coverage. Because of [name]'s past medical history and time since last visit, he will require a follow-up in-office visit in order to better assess preoperative cardiovascular risk. ? ?Pre-op covering staff: ?- Please schedule appointment and call patient to inform them. If patient already had an upcoming appointment within acceptable timeframe, please add "pre-op clearance" to the appointment notes so provider is aware. ?- Please contact requesting surgeon's office via preferred method (i.e, phone, fax) to inform them of need for appointment prior to surgery. ? ? ? ?Ledora Bottcher, PA  ?06/16/2021, 3:47 PM  ? ?

## 2021-06-16 NOTE — Telephone Encounter (Signed)
Spoke with patient and scheduled him for a preop clearance appointment with Melina Copa, PA-C on 06/24/21 at 8:50 AM. Will route note back to requesting surgeons office to make them aware. ?

## 2021-06-16 NOTE — Telephone Encounter (Signed)
? ?  Pre-operative Risk Assessment  ?  ?Patient Name: [name]  ?DOB: 05/31/1933 ?MRN: [id number]  ? ?  ? ?Request for Surgical Clearance   ? ?Procedure:   cystoscopy with bladder biopsy and possible transurethral resection of bladder tumor  ? ?Date of Surgery:  Clearance TBD                              ?   ?Surgeon:  Dr. Rexene Alberts ?Surgeon's Group or Practice Name:  [facility] ?Phone number:  [phone] ext. [phone] ?Fax number:  [phone] ?  ?Type of Clearance Requested:   ?- Medical  ?- Pharmacy:  Hold Apixaban (Eliquis) hold three day prior ?  ?Type of Anesthesia:  General  ?  ?Additional requests/questions:     ? ?Signed, ?Ermelinda Das   ?06/16/2021, 9:26 AM   ?

## 2021-06-16 NOTE — Telephone Encounter (Signed)
Patient with diagnosis of afib on Eliquis for anticoagulation.   ? ?Procedure: cystoscopy with bladder biopsy and possible transurethral resection of bladder tumor  ?Date of procedure: TBD ? ?CHA2DS2-VASc Score = 5  ?This indicates a 7.2% annual risk of stroke. ?The patient's score is based upon: ?CHF History: 1 ?HTN History: 1 ?Diabetes History: 0 ?Stroke History: 0 ?Vascular Disease History: 1 ?Age Score: 2 ?Gender Score: 0 ?  ?CrCl >111m/min ?Platelet count 205K ? ?Per office protocol, patient can hold Eliquis for 3 days prior to procedure as requested.   ?

## 2021-06-17 ENCOUNTER — Ambulatory Visit (HOSPITAL_COMMUNITY)
Admission: RE | Admit: 2021-06-17 | Discharge: 2021-06-17 | Disposition: A | Discharge status: Home / Self Care | Payer: Medicare Other | Source: Ambulatory Visit | Attending: Urology | Admitting: Urology

## 2021-06-17 ENCOUNTER — Encounter (HOSPITAL_COMMUNITY)
Admission: RE | Admit: 2021-06-17 | Discharge: 2021-06-17 | Disposition: A | Discharge status: Home / Self Care | Payer: Medicare Other | Source: Ambulatory Visit | Attending: Urology | Admitting: Urology

## 2021-06-17 ENCOUNTER — Other Ambulatory Visit (INDEPENDENT_AMBULATORY_CARE_PROVIDER_SITE_OTHER): Payer: Medicare Other

## 2021-06-17 DIAGNOSIS — C61 Malignant neoplasm of prostate: Secondary | ICD-10-CM | POA: Diagnosis not present

## 2021-06-17 DIAGNOSIS — E7800 Pure hypercholesterolemia, unspecified: Secondary | ICD-10-CM

## 2021-06-17 DIAGNOSIS — I4891 Unspecified atrial fibrillation: Secondary | ICD-10-CM

## 2021-06-17 DIAGNOSIS — C679 Malignant neoplasm of bladder, unspecified: Secondary | ICD-10-CM | POA: Diagnosis not present

## 2021-06-17 LAB — LIPID PANEL
Cholesterol: 172 mg/dL (ref 0–200)
HDL: 39.7 mg/dL (ref >39.00)
LDL Cholesterol: 106 mg/dL — ABNORMAL HIGH (ref 0–99)
NonHDL: 132.73
Total CHOL/HDL Ratio: 4
Triglycerides: 132 mg/dL (ref 0.0–149.0)
VLDL: 26.4 mg/dL (ref 0.0–40.0)

## 2021-06-17 LAB — TSH: TSH: 2.01 u[IU]/mL (ref 0.35–5.50)

## 2021-06-17 LAB — COMPREHENSIVE METABOLIC PANEL
ALT: 14 U/L (ref 0–53)
AST: 14 U/L (ref 0–37)
Albumin: 4 g/dL (ref 3.5–5.2)
Alkaline Phosphatase: 97 U/L (ref 39–117)
BUN: 17 mg/dL (ref 6–23)
CO2: 30 mEq/L (ref 19–32)
Calcium: 9.2 mg/dL (ref 8.4–10.5)
Chloride: 102 mEq/L (ref 96–112)
Creatinine, Ser: 1.05 mg/dL (ref 0.40–1.50)
GFR: 64.81 mL/min (ref >60.00)
Glucose, Bld: 117 mg/dL — ABNORMAL HIGH (ref 70–99)
Potassium: 4.1 mEq/L (ref 3.5–5.1)
Sodium: 140 mEq/L (ref 135–145)
Total Bilirubin: 0.9 mg/dL (ref 0.2–1.2)
Total Protein: 7.2 g/dL (ref 6.0–8.3)

## 2021-06-17 MED ORDER — TECHNETIUM TC 99M MEDRONATE IV KIT
20.6000 | PACK | Freq: Once | INTRAVENOUS | Status: AC
  Administered 2021-06-17: 20.6 via INTRAVENOUS

## 2021-06-17 NOTE — Telephone Encounter (Addendum)
Noted. Will plan to finalize recs at upcoming [place]. ?

## 2021-06-18 ENCOUNTER — Other Ambulatory Visit: Payer: Self-pay | Admitting: Urology

## 2021-06-18 DIAGNOSIS — I2510 Atherosclerotic heart disease of native coronary artery without angina pectoris: Secondary | ICD-10-CM | POA: Diagnosis not present

## 2021-06-18 DIAGNOSIS — C786 Secondary malignant neoplasm of retroperitoneum and peritoneum: Secondary | ICD-10-CM | POA: Diagnosis not present

## 2021-06-18 DIAGNOSIS — Z8551 Personal history of malignant neoplasm of bladder: Secondary | ICD-10-CM | POA: Diagnosis not present

## 2021-06-18 DIAGNOSIS — I358 Other nonrheumatic aortic valve disorders: Secondary | ICD-10-CM | POA: Diagnosis not present

## 2021-06-18 DIAGNOSIS — R590 Localized enlarged lymph nodes: Secondary | ICD-10-CM | POA: Diagnosis not present

## 2021-06-22 ENCOUNTER — Ambulatory Visit (INDEPENDENT_AMBULATORY_CARE_PROVIDER_SITE_OTHER): Payer: Medicare Other | Admitting: Family Medicine

## 2021-06-22 ENCOUNTER — Encounter: Payer: Self-pay | Admitting: Family Medicine

## 2021-06-22 DIAGNOSIS — Z7189 Other specified counseling: Secondary | ICD-10-CM | POA: Diagnosis not present

## 2021-06-22 DIAGNOSIS — I4891 Unspecified atrial fibrillation: Secondary | ICD-10-CM

## 2021-06-22 DIAGNOSIS — E7800 Pure hypercholesterolemia, unspecified: Secondary | ICD-10-CM

## 2021-06-22 DIAGNOSIS — C679 Malignant neoplasm of bladder, unspecified: Secondary | ICD-10-CM | POA: Diagnosis not present

## 2021-06-22 DIAGNOSIS — I10 Essential (primary) hypertension: Secondary | ICD-10-CM | POA: Diagnosis not present

## 2021-06-22 DIAGNOSIS — E039 Hypothyroidism, unspecified: Secondary | ICD-10-CM | POA: Diagnosis not present

## 2021-06-22 DIAGNOSIS — I714 Abdominal aortic aneurysm, without rupture, unspecified: Secondary | ICD-10-CM | POA: Diagnosis not present

## 2021-06-22 MED ORDER — RAMIPRIL 5 MG PO CAPS: 5.0000 mg | ORAL_CAPSULE | Freq: Two times a day (BID) | ORAL | 3 refills | Status: DC

## 2021-06-22 NOTE — Patient Instructions (Addendum)
Let me check on AAA follow up options.  ?I'll update cardiology.  ?Don't change your meds for now and I'll await the urology notes.  ?Take care.  Glad to see you.[name] ?

## 2021-06-22 NOTE — Progress Notes (Signed)
AF. He had cardiology f/u pending, re: preop eval.  No bleeding except for as below with hematuria/etc but not bleeding now.   ? ?Elevated Cholesterol: ?Using medications without problems: yes ?Muscle aches: no ?Diet compliance: d/w pt.   ?Exercise:limited recently ? ?Discussed with patient about previous AAA evaluation I will ask for cardiology input about this.  He had seen Dr. Bridgett Larsson previously with vascular. ? ?Son Chrissie Noa and daughter Gerre Pebbles designated if patient were incapacitated.   ? ?Hypothyroidism.  Compliant.  No neck mass.  No dysphagia.  TSH wnl.   ? ?Hypertension:    ?Using medication without problems or lightheadedness: yes ?Chest pain with exertion:no ?Edema:occ trace edema.   ?Short of breath:no ? ?No more blood in urine.  He had recent NM scan, d/w pt.  He has urology f/u for tomorrow.  I will await those notes. ? ?He had episodic brief small amount of BRBPR about a few months ago and not in the meantime.  It was after straining with a hard stool.  We agreed to observe for now and he'll have urology f/u in the meantime.   ? ?PMH and SH reviewed ? ?Meds, vitals, and allergies reviewed.  ? ?ROS: Per HPI unless specifically indicated in ROS section  ? ?GEN: nad, alert and oriented ?HEENT: ncat ?NECK: supple w/o LA ?CV: IRR, not tachy ?PULM: ctab, no inc wob ?ABD: soft, +bs ?EXT: no edema ?SKIN: well perfused.  ?

## 2021-06-23 ENCOUNTER — Encounter: Payer: Self-pay | Admitting: Physician Assistant

## 2021-06-23 ENCOUNTER — Telehealth: Payer: Self-pay | Admitting: Family Medicine

## 2021-06-23 DIAGNOSIS — C778 Secondary and unspecified malignant neoplasm of lymph nodes of multiple regions: Secondary | ICD-10-CM | POA: Diagnosis not present

## 2021-06-23 DIAGNOSIS — I714 Abdominal aortic aneurysm, without rupture, unspecified: Secondary | ICD-10-CM

## 2021-06-23 DIAGNOSIS — N401 Enlarged prostate with lower urinary tract symptoms: Secondary | ICD-10-CM | POA: Diagnosis not present

## 2021-06-23 DIAGNOSIS — C678 Malignant neoplasm of overlapping sites of bladder: Secondary | ICD-10-CM | POA: Diagnosis not present

## 2021-06-23 DIAGNOSIS — R3912 Poor urinary stream: Secondary | ICD-10-CM | POA: Diagnosis not present

## 2021-06-23 DIAGNOSIS — C61 Malignant neoplasm of prostate: Secondary | ICD-10-CM | POA: Diagnosis not present

## 2021-06-23 NOTE — Assessment & Plan Note (Signed)
Continue levothyroxine.  Labs discussed with patient. ?

## 2021-06-23 NOTE — Assessment & Plan Note (Signed)
Controlled.  Continue ramipril. ?

## 2021-06-23 NOTE — Assessment & Plan Note (Signed)
Continue atorvastatin

## 2021-06-23 NOTE — Progress Notes (Signed)
? ? ?Office Visit  ?  ?Patient Name: [name] ?Date of Encounter: 06/24/2021 ? ?Primary Care Provider:  Tonia Ghent, MD ?Primary Cardiologist:  Sherren Mocha, MD ?Primary Electrophysiologist: None ? ?Patient Profile  ?  ?Chief Complaint: Preoperative clearance for cystoscopy procedure  ? ? Notable History: ?CAD s/p CABG x 7 in 1992 ?Permanent AF (on Eliquis) ?RBBB ?Abdominal aortic aneurysm s/p EVAR ?Chronic kidney disease stage 2 ?COPD ?HTN ?HLD ?ICM ?Dilated ascending aorta (42 mm 08/2019) ?Mild AS, moderate TR by echo 2021 ? ? Recent Studies: ?Lexiscan Myoview 08/2019: EF 60%, diaphragmatic attenuation, no ischemia, low risk study ?2D echo 08/2019: EF 60-65%, no RWMA, mild LVH, normal RV SF, severe LAE, mild RAE, mild MR, trivial AR, AV sclerosis without aortic stenosis, dilated aortic aneurysm ascending (42 mm) ? ?History of Present Illness  ?  ?[name] is a 88 y.o. male with PMH as outlined above. Patient had CABG in 1992 and developed chronic A-fib for 2 to 3 years following.  Last nuclear stress test in 08/2019 was normal. Last echo 11/2020 EF 55-60%, mildly reduced RVSF, mildly elevated PASP, severe BAE, moderate TR, mild dilation of ascending aorta and mild AS. (Has a prior history of EF in the 40-45% range remotely that has recovered.) ? ?Since last being seen in follow up [name] states that he is doing well since his last visit regarding his heart health. He is however having some bladder and prostate issues that are requiring him to have new biopsies..  Therefore he presents today for preoperative clearance for cystoscopy.  Patient's blood pressure today was elevated at 162/80 and his heart rate was 86. His EKG showed PVCs and old EKGs with similar findings were reassuring. We discussed the possibility of adding additional blood pressure support with BB  however patient states that he usually has whitecoat hypertension and his pressures at home are usually much better.  He also  reports GI upset in the past with metoprolol. Patient denies chest pain, palpitations, dyspnea, PND, orthopnea, nausea, vomiting, dizziness, syncope, edema, weight gain, or early satiety. ? ?Past Medical History  ?  ?Past Medical History:  ?Diagnosis Date  ? AAA (abdominal aortic aneurysm) ([place])   ? s/p stent graft repair 2012  ? Arthritis   ? "right ankle" (04/17/2014)  ? Ascending aorta dilatation (HCC)   ? Echo 2021: 42 mm // Echocardiogram 10/22: EF 55-60, no RWMA, mildly reduced RVSF, RVSP 39.7, severe BAE, trivial MR, mod TR, trivial AI, mild AS (mean 8), ascending aorta 40 mm  ? Atrial fibrillation ([place])   ? a. permanent;  b. Hematuria on Pradaxa;  c. cold intol and chills with Xarelto  ? Bladder cancer ([place])   ? Cardiomyopathy ([place])   ? a. Echo (03/2013): Inferior and inferolateral AK, EF 40-45%, mild MR, mod LAE, mod RAE, mod TR, mild PI, PASP 46  ? Chronic kidney disease   ? COPD (chronic obstructive pulmonary disease) ([place])   ? Coronary artery disease s/p cabg x7  1992  ? a. s/p CABG in 1992 (X 7 - Dr. Redmond Pulling);  b.  Myoview (01/2011): Fixed inferior defect suggestive of prior MI versus diaphragmatic attenuation, apical reversibility possibly suspicious for small area of infarction with peri-infarct ischemia, no significant change from prior study, not gated, low risk study;  c.  Echo (05/2010): Mod LVH, EF 55-65%, mild AI, mild MR, moderate LAE, mild RAE, moderate   ? Hematuria bladder tumor ---  followed by Dr Karsten Ro  ?  Hiatal hernia   ? repaired in 1992 w/[facility]  ? Hx of cardiovascular stress test   ? Lexiscan Myoview (03/2013):  No ischemia; not gated.  ? Hyperglycemia diet controlled  ? Hyperlipidemia   ? Hypertension   ? Hypothyroidism   ? Obesity   ? Organic impotence   ? RBBB   ? S/P AAA repair using bifurcation graft   ? Sleep apnea no rx cpap  ? mild- tested  2012  ? Ventricular ectopy   ? mild  ? ?Past Surgical History:  ?Procedure Laterality Date  ? ABDOMINAL AORTIC ANEURYSM REPAIR  2012  ? using  bifurcation graft  ? CARDIAC CATHETERIZATION  03-1990  ? Positive  ? CARDIOVASCULAR STRESS TEST  02-01-2011  NUCLEAR NO EXERCISE  ? LOW RISK STUDY.  SMALL APICAL DEFECT. NO EVIDENCE OF MULTIZONE ISCHEMIA.  ? CORONARY ARTERY BYPASS GRAFT  05/1990  ? CABG X 7  ? ENDOVASCULAR STENT INSERTION  02/15/2011  ? Procedure: ENDOVASCULAR STENT GRAFT INSERTION;  Surgeon: Hinda Lenis, MD;  Location: [place];  Service: Vascular;  Laterality: N/A;  Insertion of endovascular stent graft   ? ETT  10/31/00  ? WNL  ? HIATAL HERNIA REPAIR  1992  ? "repaired when I had my O[facility]"  ? Hospital - Afib  03/28/02  ? INGUINAL HERNIA REPAIR Left 1997  ? JOINT REPLACEMENT Right 04-17-14  ? Ankle  ? TOTAL ANKLE ARTHROPLASTY Right 04/17/2014  ? Procedure: RIGHT TOTAL ANKLE ARTHOPLASTY;  Surgeon: Wylene Simmer, MD;  Location: [place];  Service: Orthopedics;  Laterality: Right;  ? TOTAL ANKLE REPLACEMENT Right 04/17/2014  ? TRANSURETHRAL RESECTION OF BLADDER TUMOR  03/25/2011  ? Procedure: TRANSURETHRAL RESECTION OF BLADDER TUMOR (TURBT);  Surgeon: Claybon Jabs, MD;  Location: [facility];  Service: Urology;  Laterality: N/A;  ? ? ?Allergies ? ?Allergies  ?Allergen Reactions  ? Dabigatran Etexilate Mesylate   ?  Blood in urine  ? Xarelto [Rivaroxaban] Shortness Of Breath and Other (See Comments)  ?  SOB, CHILLS  ? Metoprolol   ?  Upset stomach  ? ? ?Home Medications  ?  ?Current Outpatient Medications  ?Medication Sig Dispense Refill  ? atorvastatin (LIPITOR) 40 MG tablet TAKE 1 TABLET BY MOUTH ONCE A DAY 90 tablet 3  ? ELIQUIS 5 MG TABS tablet TAKE 1 TABLET BY MOUTH TWICE (2) DAILY 60 tablet 5  ? finasteride (PROSCAR) 5 MG tablet Take 5 mg by mouth daily.    ? levothyroxine (SYNTHROID) 137 MCG tablet TAKE 1 TABLET BY MOUTH ONCE A DAY ON AN EMPTY STOMACH 90 tablet 3  ? ramipril (ALTACE) 5 MG capsule Take 1 capsule (5 mg total) by mouth 2 (two) times daily. 180 capsule 3  ? sildenafil (REVATIO) 20 MG tablet TAKE 3 TO 5 TABLETS BY MOUTH  DAILY AS NEEDED 50 tablet 12  ? tamsulosin (FLOMAX) 0.4 MG CAPS capsule Take 0.4 mg by mouth at bedtime.    ? ?No current facility-administered medications for this visit.  ?  ? ?Review of Systems  ?Please see the history of present illness.    ? ?All other systems reviewed and are otherwise negative except as noted above. ? ?Physical Exam  ?  ?Wt Readings from Last 3 Encounters:  ?06/24/21 265 lb (120.2 kg)  ?06/22/21 265 lb (120.2 kg)  ?06/02/21 260 lb (117.9 kg)  ? ?VS: ?Vitals:  ? 06/24/21 0823  ?BP: (!) 162/80  ?Pulse: 86  ?SpO2: 99%  ?,Body mass index is  36.96 kg/m?. ? ?Constitutional:   ?   Appearance: Healthy appearance. Not in distress.  ?Neck:  ?   Vascular: JVD normal.  ?Pulmonary:  ?   Effort: Pulmonary effort is normal.  ?   Breath sounds: No wheezing. No rales. Diminished in the bases ?Cardiovascular:  ?   Normal rate.  Irregularly irregular with PVCs normal S1. Normal S2.   ?   Murmurs: There is no murmur.  ?Edema: ?   Peripheral edema present that is chronic following bypass surgery.  ?Abdominal:  ?   Palpations: Abdomen is soft non tender. There is no hepatomegaly.  ?Skin: ?   General: Skin is warm and dry.  ?Neurological:  ?   General: No focal deficit present.  ?   Mental Status: Alert and oriented to person, place and time.  ?   Cranial Nerves: Cranial nerves are intact.  ?EKG/LABS/Other Studies Reviewed  ?  ?ECG personally reviewed by me today -atrial fibrillation with premature ventricular contractions and left axis deviation with right bundle branch block.  Patient has remote EKGs consistent with PVCs which is reassuring of nonacute finding. ? ?Risk Assessment/Calculations:   ? ?CHA2DS2-VASc Score = 5  ? This indicates a 7.2% annual risk of stroke. ?The patient's score is based upon: ?CHF History: 1 ?HTN History: 1 ?Diabetes History: 0 ?Stroke History: 0 ?Vascular Disease History: 1 ?Age Score: 2 ?Gender Score: 0 ?  ? ?Lab Results  ?Component Value Date  ? WBC 6.8 06/02/2021  ? HGB 15.4  06/02/2021  ? HCT 47.5 06/02/2021  ? MCV 91.5 06/02/2021  ? PLT 205 06/02/2021  ? ?Lab Results  ?Component Value Date  ? CREATININE 1.05 06/17/2021  ? BUN 17 06/17/2021  ? NA 140 06/17/2021  ? K 4.1 06/17/2021  ? CL 1

## 2021-06-23 NOTE — Assessment & Plan Note (Signed)
History of, with hematuria noted and urology follow-up pending.  I will await consult note. ?

## 2021-06-23 NOTE — Assessment & Plan Note (Signed)
Discussed with patient about previous AAA evaluation I will ask for cardiology input about this.  He had seen Dr. Bridgett Larsson previously with vascular. ?

## 2021-06-23 NOTE — Assessment & Plan Note (Signed)
Continue anticoagulation with Eliquis.  Continue atorvastatin and ramipril.  He has cardiology follow-up pending. ?

## 2021-06-23 NOTE — Assessment & Plan Note (Signed)
Son Chrissie Noa and daughter Gerre Pebbles designated if patient were incapacitated.   ?

## 2021-06-23 NOTE — Telephone Encounter (Signed)
Thank you for seeing this patient.  Please see my office visit note.  Are you able to follow him for his previous AAA?  Either way, please let me know.  Thanks. ?

## 2021-06-24 ENCOUNTER — Ambulatory Visit (INDEPENDENT_AMBULATORY_CARE_PROVIDER_SITE_OTHER): Payer: Medicare Other | Admitting: Nurse Practitioner

## 2021-06-24 ENCOUNTER — Encounter: Payer: Self-pay | Admitting: Physician Assistant

## 2021-06-24 VITALS — BP 162/80 | HR 86 | Ht 71.0 in | Wt 265.0 lb

## 2021-06-24 DIAGNOSIS — I4821 Permanent atrial fibrillation: Secondary | ICD-10-CM | POA: Diagnosis not present

## 2021-06-24 DIAGNOSIS — I493 Ventricular premature depolarization: Secondary | ICD-10-CM | POA: Diagnosis not present

## 2021-06-24 DIAGNOSIS — I10 Essential (primary) hypertension: Secondary | ICD-10-CM

## 2021-06-24 DIAGNOSIS — I350 Nonrheumatic aortic (valve) stenosis: Secondary | ICD-10-CM

## 2021-06-24 DIAGNOSIS — I2510 Atherosclerotic heart disease of native coronary artery without angina pectoris: Secondary | ICD-10-CM | POA: Diagnosis not present

## 2021-06-24 DIAGNOSIS — I714 Abdominal aortic aneurysm, without rupture, unspecified: Secondary | ICD-10-CM

## 2021-06-24 DIAGNOSIS — Z01818 Encounter for other preprocedural examination: Secondary | ICD-10-CM

## 2021-06-24 LAB — MAGNESIUM: Magnesium: 2.1 mg/dL (ref 1.6–2.3)

## 2021-06-24 NOTE — Patient Instructions (Addendum)
Medication Instructions:  ?Your physician recommends that you continue on your current medications as directed. Please refer to the Current Medication list given to you today. ?*If you need a refill on your cardiac medications before your next appointment, please call your pharmacy* ? ? ?Lab Work: ?TODAY-MAG ?If you have labs (blood work) drawn today and your tests are completely normal, you will receive your results only by: ?MyChart Message (if you have MyChart) OR ?A paper copy in the mail ?If you have any lab test that is abnormal or we need to change your treatment, we will call you to review the results. ? ? ?Testing/Procedures: ?NONE  ? ? ?Follow-Up: ?At [facility], you and your health needs are our priority.  As part of our continuing mission to provide you with exceptional heart care, we have created designated Provider Care Teams.  These Care Teams include your primary Cardiologist (physician) and Advanced Practice Providers (APPs -  Physician Assistants and Nurse Practitioners) who all work together to provide you with the care you need, when you need it. ? ?We recommend signing up for the patient portal called "MyChart".  Sign up information is provided on this After Visit Summary.  MyChart is used to connect with patients for Virtual Visits (Telemedicine).  Patients are able to view lab/test results, encounter notes, upcoming appointments, etc.  Non-urgent messages can be sent to your provider as well.   ?To learn more about what you can do with MyChart, go to [email or web address].   ? ?Your next appointment:   ?6 month(s) ? ?The format for your next appointment:   ?In Person ? ?Provider:   ?APP   ? ? ?Other Instructions ?Check you blood pressure daily then contact the office in 1-2 weeks with your blood pressure readings ? ?Important Information About Sugar ? ? ? ? ?  ?

## 2021-06-24 NOTE — Telephone Encounter (Signed)
Hi Dr. Damita Dunnings, I was working with Ambrose Pancoast today, one of our newer NPs who saw this patient. Historically vascular surgery follows their EVARs so we are recommending he get back into see them for overdue follow-up (last saw Dr. Bridgett Larsson a few years ago). Thanks! And thank you for routing the labs for [place] to review this morning. Appreciate it! ?

## 2021-06-25 NOTE — Progress Notes (Signed)
GU Location of Tumor / Histology: Metastatic Prostate Ca ? ?If Prostate Cancer, Gleason Score is (4 + 3) and PSA is (24.8 as of 05/2021) ?Biopsies:    ?Dr. Abner Greenspan ? ? ?Associated Dx:  Bladder Ca (low risk non-muscle invasive) ? ? ? ?Past/Anticipated interventions by urology, if any:  ? ? ?Past/Anticipated interventions by medical oncology, if any: NA ? ?Weight changes, if any:  No ? ?IPSS:  14 ?SHIM:  7 ? ?Bowel/Bladder complaints, if any:  No bowel issues, urinsry frequency and urgency, nocturia has medication for it. ? ?Nausea/Vomiting, if any:  No ? ?Pain issues, if any:   0/10 ? ?SAFETY ISSUES: ?Prior radiation?  No ?Pacemaker/ICD?  No ?Possible current pregnancy? Male ?Is the patient on methotrexate? No ? ?Current Complaints / other details:  Need more information on treatment options. ?

## 2021-06-27 NOTE — Addendum Note (Signed)
Addended by: Tonia Ghent on: 06/27/2021 07:03 PM ? ? Modules accepted: Orders ? ?

## 2021-06-27 NOTE — Telephone Encounter (Signed)
Please update patient.  I put in a referral to have him go back and see vascular.  Thanks. ?

## 2021-06-28 ENCOUNTER — Other Ambulatory Visit (HOSPITAL_COMMUNITY): Payer: Self-pay | Admitting: Urology

## 2021-06-28 ENCOUNTER — Other Ambulatory Visit: Payer: Self-pay | Admitting: Urology

## 2021-06-28 DIAGNOSIS — C61 Malignant neoplasm of prostate: Secondary | ICD-10-CM

## 2021-06-28 NOTE — Telephone Encounter (Signed)
lmtcb

## 2021-06-29 ENCOUNTER — Other Ambulatory Visit: Payer: Self-pay

## 2021-06-29 ENCOUNTER — Ambulatory Visit
Admission: RE | Admit: 2021-06-29 | Discharge: 2021-06-29 | Disposition: A | Discharge status: Home / Self Care | Payer: Medicare Other | Source: Ambulatory Visit | Attending: Radiation Oncology | Admitting: Radiation Oncology

## 2021-06-29 VITALS — BP 176/91 | HR 73 | Temp 97.5°F | Resp 20 | Ht 71.0 in | Wt 263.2 lb

## 2021-06-29 DIAGNOSIS — C61 Malignant neoplasm of prostate: Secondary | ICD-10-CM | POA: Insufficient documentation

## 2021-06-29 DIAGNOSIS — I714 Abdominal aortic aneurysm, without rupture, unspecified: Secondary | ICD-10-CM | POA: Insufficient documentation

## 2021-06-29 DIAGNOSIS — I429 Cardiomyopathy, unspecified: Secondary | ICD-10-CM | POA: Diagnosis not present

## 2021-06-29 DIAGNOSIS — I4891 Unspecified atrial fibrillation: Secondary | ICD-10-CM | POA: Insufficient documentation

## 2021-06-29 DIAGNOSIS — C775 Secondary and unspecified malignant neoplasm of intrapelvic lymph nodes: Secondary | ICD-10-CM

## 2021-06-29 DIAGNOSIS — I129 Hypertensive chronic kidney disease with stage 1 through stage 4 chronic kidney disease, or unspecified chronic kidney disease: Secondary | ICD-10-CM | POA: Insufficient documentation

## 2021-06-29 DIAGNOSIS — G4730 Sleep apnea, unspecified: Secondary | ICD-10-CM | POA: Diagnosis not present

## 2021-06-29 DIAGNOSIS — E039 Hypothyroidism, unspecified: Secondary | ICD-10-CM | POA: Diagnosis not present

## 2021-06-29 DIAGNOSIS — Z79899 Other long term (current) drug therapy: Secondary | ICD-10-CM | POA: Insufficient documentation

## 2021-06-29 DIAGNOSIS — E785 Hyperlipidemia, unspecified: Secondary | ICD-10-CM | POA: Insufficient documentation

## 2021-06-29 DIAGNOSIS — Z87891 Personal history of nicotine dependence: Secondary | ICD-10-CM | POA: Insufficient documentation

## 2021-06-29 DIAGNOSIS — Z7901 Long term (current) use of anticoagulants: Secondary | ICD-10-CM | POA: Diagnosis not present

## 2021-06-29 DIAGNOSIS — C778 Secondary and unspecified malignant neoplasm of lymph nodes of multiple regions: Secondary | ICD-10-CM | POA: Diagnosis not present

## 2021-06-29 DIAGNOSIS — Z191 Hormone sensitive malignancy status: Secondary | ICD-10-CM | POA: Diagnosis not present

## 2021-06-29 NOTE — Progress Notes (Signed)
Introduced myself to the patient, and his son, as the prostate nurse navigator.  He is here to discuss his radiation treatment options.  I gave him my business card and asked him to call me with questions or concerns.  Verbalized understanding.  ?

## 2021-06-29 NOTE — Progress Notes (Signed)
?Radiation Oncology         [phone] ?________________________________ ? ?Initial Outpatient Consultation ? ?Name: [name] MRN: [id number]  ?Date: 06/29/2021  DOB: 1933/06/12 ? ?GB:TDVVOH, Johnny Rising, MD  Johnny Lima, MD  ? ?REFERRING PHYSICIAN: Janith Lima, MD ? ?DIAGNOSIS: 88 y.o. gentleman with metastatic prostate cancer involving the abdominal and pelvic lymph nodes. ? ?  ICD-10-CM   ?1. Malignant neoplasm of prostate ([place])  C61   ?  ? ? ?HISTORY OF PRESENT ILLNESS: [name] is a 88 y.o. male with a diagnosis of prostate cancer. He was initially seen by Dr. Karsten Galvan in 2012 for microscopic hematuria and history of gross hematuria. CT A/P in 12/2010 showed a bladder mass. He proceeded to TURBT on 03/25/11, and pathology showed benign urothelium with papillary features and given the clinical impression of tumor, a low grade papillary neoplasm is not excluded. He was followed by Dr. Karsten Galvan through 2019 without evidence of any disease recurrence ? ?More recently, he developed recurrent gross hematuria and was referred back to Dr. Abner Galvan on 04/28/21. He underwent CT A/P on 05/13/21 showing trabecular wall thickening of the bladder with prominent effacement by an enlarged prostate with median lobe hypertrophy and  enlarged/prominent pelvic and retroperitoneal lymph nodes, nonspecific and new since 11/2016. In office cystoscopy was performed on 05/25/21 showing an obstructing prostate with severe hyperplasia and approximately 1 cm intravesical prostatic protrusion into the bladder. Urine cytology was suspicious for high-grade urothelial carcinoma. ? ?A CT guided biopsy of a left pelvic lymph node was performed on 06/02/21, and pathology revealed metastatic carcinoma with tumor cells staining positive for PSA and prostein, consistent with a prostate primary. PSA was obtained on 06/16/21 and was elevated at 24.8. He proceeded with staging bone scan on 06/17/21 showing a single focus of increased tracer uptake  in the posterior right parietal bone with no corresponding abnormality on previous head CT in 2015. The staging chest CT on 06/18/21 showed atypical clustered nodularity in the right upper lobe, new compared to radiograph 15 months prior, likely due to atypical inflammation or infection. He was started on ADT with Firmagon on 06/23/21. ? ?He is scheduled for cystoscopy with bladder biopsy on 07/23/21. ? ?The patient reviewed the biopsy results with his urologist and he has kindly been referred today for discussion of potential radiation treatment options. ? ? ?PREVIOUS RADIATION THERAPY: No ? ?PAST MEDICAL HISTORY:  ?Past Medical History:  ?Diagnosis Date  ? AAA (abdominal aortic aneurysm) ([place])   ? s/p stent graft repair 2012  ? Arthritis   ? "right ankle" (04/17/2014)  ? Ascending aorta dilatation ([facility])   ? Echo 2021: 42 mm // Echocardiogram 10/22: EF 55-60, no RWMA, mildly reduced RVSF, RVSP 39.7, severe BAE, trivial MR, mod TR, trivial AI, mild AS (mean 8), ascending aorta 40 mm  ? Atrial fibrillation ([place])   ? a. permanent;  b. Hematuria on Pradaxa;  c. cold intol and chills with Xarelto  ? Bladder cancer ([place])   ? Cardiomyopathy ([place])   ? a. Echo (03/2013): Inferior and inferolateral AK, EF 40-45%, mild MR, mod LAE, mod RAE, mod TR, mild PI, PASP 46  ? Chronic kidney disease   ? COPD (chronic obstructive pulmonary disease) ([place])   ? Coronary artery disease s/p cabg x7  1992  ? a. s/p CABG in 1992 (X 7 - Dr. Redmond Pulling);  b.  Myoview (01/2011): Fixed inferior defect suggestive of prior MI versus diaphragmatic attenuation, apical reversibility possibly  suspicious for small area of infarction with peri-infarct ischemia, no significant change from prior study, not gated, low risk study;  c.  Echo (05/2010): Mod LVH, EF 55-65%, mild AI, mild MR, moderate LAE, mild RAE, moderate   ? Hematuria bladder tumor ---  followed by Dr [name]  ? Hiatal hernia   ? repaired in 1992 w/[facility]  ? Hx of cardiovascular stress test   ? Lexiscan  Myoview (03/2013):  No ischemia; not gated.  ? Hyperglycemia diet controlled  ? Hyperlipidemia   ? Hypertension   ? Hypothyroidism   ? Obesity   ? Organic impotence   ? RBBB   ? S/P AAA repair using bifurcation graft   ? Sleep apnea no rx cpap  ? mild- tested  2012  ? Ventricular ectopy   ? mild  ?   ? ?PAST SURGICAL HISTORY: ?Past Surgical History:  ?Procedure Laterality Date  ? ABDOMINAL AORTIC ANEURYSM REPAIR  2012  ? using bifurcation graft  ? CARDIAC CATHETERIZATION  03-1990  ? Positive  ? CARDIOVASCULAR STRESS TEST  02-01-2011  NUCLEAR NO EXERCISE  ? LOW RISK STUDY.  SMALL APICAL DEFECT. NO EVIDENCE OF MULTIZONE ISCHEMIA.  ? CORONARY ARTERY BYPASS GRAFT  05/1990  ? CABG X 7  ? ENDOVASCULAR STENT INSERTION  02/15/2011  ? Procedure: ENDOVASCULAR STENT GRAFT INSERTION;  Surgeon: Hinda Lenis, MD;  Location: [facility];  Service: Vascular;  Laterality: N/A;  Insertion of endovascular stent graft   ? ETT  10/31/00  ? WNL  ? HIATAL HERNIA REPAIR  1992  ? "repaired when I had my OHS"  ? Hospital - Afib  03/28/02  ? INGUINAL HERNIA REPAIR Left 1997  ? JOINT REPLACEMENT Right 04-17-14  ? Ankle  ? TOTAL ANKLE ARTHROPLASTY Right 04/17/2014  ? Procedure: RIGHT TOTAL ANKLE ARTHOPLASTY;  Surgeon: Wylene Simmer, MD;  Location: [facility];  Service: Orthopedics;  Laterality: Right;  ? TOTAL ANKLE REPLACEMENT Right 04/17/2014  ? TRANSURETHRAL RESECTION OF BLADDER TUMOR  03/25/2011  ? Procedure: TRANSURETHRAL RESECTION OF BLADDER TUMOR (TURBT);  Surgeon: Claybon Jabs, MD;  Location: [facility];  Service: Urology;  Laterality: N/A;  ? ? ?FAMILY HISTORY:  ?Family History  ?Problem Relation Age of Onset  ? Obesity Mother 66  ?     deceased  ? Hypertension Mother   ? Diabetes Mother   ? Heart disease Mother   ?     before age 43  ? Hyperlipidemia Mother   ? Alcohol abuse Sister 62  ?     deceased  ? Other Father   ?     deceased unknown  ? Colon cancer Neg Hx   ? Prostate cancer Neg Hx   ? Anesthesia problems Neg Hx    ? ? ?SOCIAL HISTORY:  ?Social History  ? ?Socioeconomic History  ? Marital status: Widowed  ?  Spouse name: Not on file  ? Number of children: Not on file  ? Years of education: Not on file  ? Highest education level: Not on file  ?Occupational History  ? Occupation: retired Pension scheme manager.  ?  Employer: retired  ?  Comment: 2001. Likes to play golf . Travels with motor home  ?Tobacco Use  ? Smoking status: Former  ?  Packs/day: 1.25  ?  Years: 30.00  ?  Pack years: 37.50  ?  Types: Cigarettes  ?  Quit date: 02/28/1990  ?  Years since quitting: 31.3  ? Smokeless tobacco: Never  ?  Vaping Use  ? Vaping Use: Never used  ?Substance and Sexual Activity  ? Alcohol use: No  ?  Alcohol/week: 0.0 standard drinks  ?  Comment: basically no alcohol use.  only very rare alcohol use.   ? Drug use: No  ? Sexual activity: Yes  ?Other Topics Concern  ? Not on file  ?Social History Narrative  ? From [place]  ? Lives with girlfriend.    ? Divorced, remarried then widowed, 3 children (from 1st marriage)  ? Likes to golf  ? Prev 4 sport athlete in HS, football fan.   ? ?Social Determinants of Health  ? ?Financial Resource Strain: Low Risk   ? Difficulty of Paying Living Expenses: Not hard at all  ?Food Insecurity: No Food Insecurity  ? Worried About Charity fundraiser in the Last Year: Never true  ? Ran Out of Food in the Last Year: Never true  ?Transportation Needs: No Transportation Needs  ? Lack of Transportation (Medical): No  ? Lack of Transportation (Non-Medical): No  ?Physical Activity: Insufficiently Active  ? Days of Exercise per Week: 4 days  ? Minutes of Exercise per Session: 30 min  ?Stress: No Stress Concern Present  ? Feeling of Stress : Not at all  ?Social Connections: Moderately Isolated  ? Frequency of Communication with Friends and Family: More than three times a week  ? Frequency of Social Gatherings with Friends and Family: Three times a week  ? Attends Religious Services: Never  ? Active Member of Clubs or  Organizations: No  ? Attends Archivist Meetings: Never  ? Marital Status: Living with partner  ?Intimate Partner Violence: Not At Risk  ? Fear of Current or Ex-Partner: No  ? Emotionally Abused: No

## 2021-06-29 NOTE — Telephone Encounter (Signed)
lmtcb

## 2021-06-29 NOTE — Telephone Encounter (Signed)
[facility] Night - Client ?Nonclinical Telephone Record  ?[facility]? ?Client [facility] Night - Client ?Client Site [place] ?Provider Renford Dills - MD ?Contact Type Call ?Who Is Calling Patient / Member / Family / Caregiver ?Caller Name Katlin Ciszewski ?Caller Phone Number [phone] ?Call Type Message Only Information Provided ?Reason for Call Returning a Call from the Office ?Initial Comment Caller states he is returning a call from the office. ?Additional Comment Office hours provided. ?Disp. Time Disposition Final User ?06/28/2021 5:50:52 PM General Information Provided Yes Kelly Splinter ?Call Closed By: Kelly Splinter ?Transaction Date/Time: 06/28/2021 5:41:51 PM (ET ?

## 2021-06-29 NOTE — Telephone Encounter (Signed)
Patient called given information. Will call if any further questions.  ?

## 2021-07-01 ENCOUNTER — Other Ambulatory Visit: Payer: Self-pay | Admitting: Urology

## 2021-07-01 DIAGNOSIS — C61 Malignant neoplasm of prostate: Secondary | ICD-10-CM

## 2021-07-01 DIAGNOSIS — M899 Disorder of bone, unspecified: Secondary | ICD-10-CM

## 2021-07-01 NOTE — Progress Notes (Signed)
Patient is scheduled for PET PSMA for 5/10 @ 1:30pm.   Patient is aware of appointment time and date.  No further needs at this time.  ?

## 2021-07-05 NOTE — Patient Instructions (Addendum)
DUE TO COVID-19 ONLY TWO VISITORS  (aged 86 and older)  ARE ALLOWED TO COME WITH YOU AND STAY IN THE WAITING ROOM ONLY DURING PRE OP AND PROCEDURE.   ?**NO VISITORS ARE ALLOWED IN THE SHORT STAY AREA OR RECOVERY ROOM!!** ? ?IF YOU WILL BE ADMITTED INTO THE HOSPITAL YOU ARE ALLOWED ONLY FOUR SUPPORT PEOPLE DURING VISITATION HOURS ONLY (7 AM -8PM)   ?The support person(s) must pass our screening, gel in and out, and wear a mask at all times, including in the patient?s room. ?Patients must also wear a mask when staff or their support person are in the room. ?Visitors GUEST BADGE MUST BE WORN VISIBLY  ?One adult visitor may remain with you overnight and MUST be in the room by 8 P.M. ?  ? ? Your procedure is scheduled on: 07/23/21 ? ? Report to [facility] Main Entrance ? ?  Report to  short stay at 5:15 AM ? ? Call this number if you have problems the morning of surgery [phone] ? ? Do not eat food or drink :After Midnight. ? ?  ? ?  ?       If you have questions, please contact your surgeon?s office. ? ? ?  ?Oral Hygiene is also important to reduce your risk of infection.                                    ?Remember - BRUSH YOUR TEETH THE MORNING OF SURGERY WITH YOUR REGULAR TOOTHPASTE ? ? Do NOT smoke after Midnight ? ? Take these medicines the morning of surgery with A SIP OF WATER: Finasteride, Levothyroxine ? ? ? Before surgery.Stop taking __Eliquis_________on _5/22_________as instructed by _Dr. Cooper____________. ? ? ?  ?Bring CPAP mask and tubing day of surgery. ?                  ?           You may not have any metal on your body including hair pins, jewelry, and body piercing ? ?           Do not wear make-up, lotions, powders, perfumes/cologne, or deodorant ? ? ?            Men may shave face and neck. ? ? Do not bring valuables to the hospital. [facility] NOT ?            RESPONSIBLE   FOR VALUABLES. ? ? Contacts, dentures or bridgework may not be worn into surgery. ? ? Bring small overnight  bag day of surgery. ?  ? Patients discharged on the day of surgery will not be allowed to drive home.  Someone NEEDS to stay with you for the first 24 hours after anesthesia. ? ? Special Instructions: Bring a copy of your healthcare power of attorney and living will documents  the day of surgery if you haven't scanned them before. ? ?            Please read over the following fact sheets you were given: IF [place] [phone] ? ?   [facility] - Preparing for Surgery ?Before surgery, you can play an important role.  Because skin is not sterile, your skin needs to be as free of germs as possible.  You can reduce the number of germs on your skin by washing with CHG (  chlorahexidine gluconate) soap before surgery.  CHG is an antiseptic cleaner which kills germs and bonds with the skin to continue killing germs even after washing. ?Please DO NOT use if you have an allergy to CHG or antibacterial soaps.  If your skin becomes reddened/irritated stop using the CHG and inform your nurse when you arrive at Short Stay.  You may shave your face/neck. ?Please follow these instructions carefully: ? 1.  Shower with CHG Soap the night before surgery and the  morning of Surgery. ? 2.  If you choose to wash your hair, wash your hair first as usual with your  normal  shampoo. ? 3.  After you shampoo, rinse your hair and body thoroughly to remove the  shampoo.                     ?       4.  Use CHG as you would any other liquid soap.  You can apply chg directly  to the skin and wash  ?                     Gently with a scrungie or clean washcloth. ? 5.  Apply the CHG Soap to your body ONLY FROM THE NECK DOWN.   Do not use on face/ open      ?                     Wound or open sores. Avoid contact with eyes, ears mouth and genitals (private parts).  ?                     Production manager,  Genitals (private parts) with your normal soap. ?            6.  Wash thoroughly, paying special  attention to the area where your surgery  will be performed. ? 7.  Thoroughly rinse your body with warm water from the neck down. ? 8.  DO NOT shower/wash with your normal soap after using and rinsing off  the CHG Soap. ?               9.  Pat yourself dry with a clean towel. ?           10.  Wear clean pajamas. ?           11.  Place clean sheets on your bed the night of your first shower and do not  sleep with pets. ?Day of Surgery : ?Do not apply any lotions/deodorants the morning of surgery.  Please wear clean clothes to the hospital/surgery center. ? ?FAILURE TO FOLLOW THESE INSTRUCTIONS MAY RESULT IN THE CANCELLATION OF YOUR SURGERY ? ? ?________________________________________________________________________  ?

## 2021-07-06 ENCOUNTER — Encounter (HOSPITAL_COMMUNITY): Payer: Self-pay

## 2021-07-06 ENCOUNTER — Other Ambulatory Visit: Payer: Self-pay

## 2021-07-06 ENCOUNTER — Encounter (HOSPITAL_COMMUNITY)
Admission: RE | Admit: 2021-07-06 | Discharge: 2021-07-06 | Disposition: A | Discharge status: Home / Self Care | Payer: Medicare Other | Source: Ambulatory Visit | Attending: Urology | Admitting: Urology

## 2021-07-06 DIAGNOSIS — Z01812 Encounter for preprocedural laboratory examination: Secondary | ICD-10-CM | POA: Insufficient documentation

## 2021-07-06 DIAGNOSIS — I10 Essential (primary) hypertension: Secondary | ICD-10-CM | POA: Insufficient documentation

## 2021-07-06 LAB — BASIC METABOLIC PANEL
Anion gap: 10 (ref 5–15)
BUN: 17 mg/dL (ref 8–23)
CO2: 25 mmol/L (ref 22–32)
Calcium: 9.3 mg/dL (ref 8.9–10.3)
Chloride: 104 mmol/L (ref 98–111)
Creatinine, Ser: 0.93 mg/dL (ref 0.61–1.24)
GFR, Estimated: >60 mL/min (ref >60)
Glucose, Bld: 105 mg/dL — ABNORMAL HIGH (ref 70–99)
Potassium: 4.2 mmol/L (ref 3.5–5.1)
Sodium: 139 mmol/L (ref 135–145)

## 2021-07-06 LAB — CBC
HCT: 46.8 % (ref 39.0–52.0)
Hemoglobin: 14.9 g/dL (ref 13.0–17.0)
MCH: 29.2 pg (ref 26.0–34.0)
MCHC: 31.8 g/dL (ref 30.0–36.0)
MCV: 91.8 fL (ref 80.0–100.0)
Platelets: 224 10*3/uL (ref 150–400)
RBC: 5.1 MIL/uL (ref 4.22–5.81)
RDW: 13.5 % (ref 11.5–15.5)
WBC: 6 10*3/uL (ref 4.0–10.5)
nRBC: 0 % (ref 0.0–0.2)

## 2021-07-06 NOTE — Progress Notes (Signed)
Anesthesia note: ? ?Bowel prep reminder:NA ? ?PCP - Dr. Orlinda Blalock ?Cardiologist -Dr. Ezzie Dural ?Other-  ? ?Chest x-ray - no ?EKG - 06/24/21-epic ?Stress Test - 2021 ?ECHO - 12/14/20-epic ?Cardiac Cath - 1992, CABG x7 1992, AAA repair 2012 ? ?Pacemaker/ICD device last checked:NA ? ?Sleep Study - yes negative results ?CPAP -  ? ?Pt is pre diabetic-NA ?Fasting Blood Sugar -  ?Checks Blood Sugar _____ ? ?Blood Thinner:Eliquis for a-fib/ Dr. Burt Knack ?Blood Thinner Instructions:hold 5 days prior to DOS/ Dr. Abner Greenspan ?Aspirin Instructions: ?Last Dose:07/19/21 ? ?Anesthesia review: yes ? ?Patient denies shortness of breath, fever, cough and chest pain at PAT appointment ?Pt denies SOB with activity.  He has COPD. ? ?Patient verbalized understanding of instructions that were given to them at the PAT appointment. Patient was also instructed that they will need to review over the PAT instructions again at home before surgery. yes ?

## 2021-07-07 ENCOUNTER — Encounter (HOSPITAL_COMMUNITY)
Admission: RE | Admit: 2021-07-07 | Discharge: 2021-07-07 | Disposition: A | Discharge status: Home / Self Care | Payer: Medicare Other | Source: Ambulatory Visit | Attending: Urology | Admitting: Urology

## 2021-07-07 DIAGNOSIS — C775 Secondary and unspecified malignant neoplasm of intrapelvic lymph nodes: Secondary | ICD-10-CM | POA: Diagnosis not present

## 2021-07-07 DIAGNOSIS — C772 Secondary and unspecified malignant neoplasm of intra-abdominal lymph nodes: Secondary | ICD-10-CM | POA: Diagnosis not present

## 2021-07-07 DIAGNOSIS — C61 Malignant neoplasm of prostate: Secondary | ICD-10-CM | POA: Insufficient documentation

## 2021-07-07 MED ORDER — PIFLIFOLASTAT F 18 (PYLARIFY) INJECTION
9.0000 | Freq: Once | INTRAVENOUS | Status: AC
  Administered 2021-07-07: 8.4 via INTRAVENOUS

## 2021-07-08 ENCOUNTER — Encounter: Payer: Self-pay | Admitting: Urology

## 2021-07-08 ENCOUNTER — Ambulatory Visit: Payer: Self-pay | Admitting: Urology

## 2021-07-08 NOTE — Progress Notes (Signed)
Telephone appointment. I verified patient identity and began nursing interview. Patient reports polyuria. No other issues reported at this time. ? ?Meaningful use complete. ?I-PSS score of 9 (moderate). ?Flomax 0.'4mg'$  as directed. ?Urology appointment- June 5th, 2023 w/ [facility] ? ?Reminded patient of his 11:00am-07/09/21 telephone appointment w/ Ashlyn Bruning PA-C. I left my extension [phone] in case patient needs anything. Patient verbalized understanding of information. ? ?Patient preferred contact [phone] ?

## 2021-07-09 ENCOUNTER — Ambulatory Visit
Admission: RE | Admit: 2021-07-09 | Discharge: 2021-07-09 | Disposition: A | Discharge status: Home / Self Care | Payer: Medicare Other | Source: Ambulatory Visit | Attending: Urology | Admitting: Urology

## 2021-07-09 DIAGNOSIS — C61 Malignant neoplasm of prostate: Secondary | ICD-10-CM | POA: Insufficient documentation

## 2021-07-09 DIAGNOSIS — Z191 Hormone sensitive malignancy status: Secondary | ICD-10-CM | POA: Diagnosis not present

## 2021-07-09 NOTE — Progress Notes (Signed)
?Radiation Oncology         [phone] ?________________________________ ? ?Outpatient Follow up ? ?Name: [name] MRN: [id number]  ?Date: 07/09/2021  DOB: August 29, 1933 ? ?TO:IZTIWP, [name], MD  Janith Lima, MD  ? ?REFERRING PHYSICIAN: Janith Lima, MD ? ?DIAGNOSIS: 88 y.o. gentleman with widespread metastatic prostate cancer involving the bony skeleton and lymph nodes in the chest, abdomen and pelvis. ? ?  ICD-10-CM   ?1. Malignant neoplasm of prostate metastatic to intrapelvic lymph node ([place])  C61   ? C77.5   ?  ?2. Malignant neoplasm of prostate metastatic to bone [facility])  C61   ? C79.51   ?  ?3. Malignant neoplasm of prostate metastatic to intra-abdominal lymph node ([place])  C61   ? C77.2   ?  ? ? ?HISTORY OF PRESENT ILLNESS: [name] is a 88 y.o. male with a diagnosis of prostate cancer. He was initially seen by Dr. Karsten Ro in 2012 for microscopic hematuria and history of gross hematuria. CT A/P in 12/2010 showed a bladder mass. He proceeded to TURBT on 03/25/11, and pathology showed benign urothelium with papillary features and given the clinical impression of tumor, a low grade papillary neoplasm is not excluded. He was followed by Dr. Karsten Ro through 2019 without evidence of any disease recurrence ? ?More recently, he developed recurrent gross hematuria and was referred back to Dr. Abner Greenspan on 04/28/21. He underwent CT A/P on 05/13/21 showing trabecular wall thickening of the bladder with prominent effacement by an enlarged prostate with median lobe hypertrophy and  enlarged/prominent pelvic and retroperitoneal lymph nodes, nonspecific and new since 11/2016. In office cystoscopy was performed on 05/25/21 showing an obstructing prostate with severe hyperplasia and approximately 1 cm intravesical prostatic protrusion into the bladder. Urine cytology was suspicious for high-grade urothelial carcinoma. ? ?A CT guided biopsy of a left pelvic lymph node was performed on 06/02/21, and pathology revealed  metastatic carcinoma with tumor cells staining positive for PSA and prostein, consistent with a prostate primary. PSA was obtained on 06/16/21 and was elevated at 24.8. He proceeded with staging bone scan on 06/17/21 showing a single focus of increased tracer uptake in the posterior right parietal bone with no corresponding abnormality on previous head CT in 2015. The staging chest CT on 06/18/21 showed atypical clustered nodularity in the right upper lobe, new compared to radiograph 15 months prior, likely due to atypical inflammation or infection. He was started on ADT with Firmagon on 06/23/21.  We met with the patient and his wife on 06/29/2021 to discuss the potential role of radiotherapy in the management of his disease.  We recommended obtaining a PSMA PET scan to determine the extent of disease prior to reaching a formal treatment recommendation.  The PSMA PET scan was performed on 07/08/2021 and unfortunately, shows widespread metastatic disease involving the bony skeleton as well as lymph nodes in the chest, abdomen and pelvis.  We reviewed these results today. ? ?He is scheduled for cystoscopy with bladder biopsy on 07/23/21 and a CT head scan on 07/27/2021. ? ?PREVIOUS RADIATION THERAPY: No ? ?PAST MEDICAL HISTORY:  ?Past Medical History:  ?Diagnosis Date  ? AAA (abdominal aortic aneurysm) ([place])   ? s/p stent graft repair 2012  ? Arthritis   ? "right ankle" (04/17/2014)  ? Ascending aorta dilatation ([facility])   ? Echo 2021: 42 mm // Echocardiogram 10/22: EF 55-60, no RWMA, mildly reduced RVSF, RVSP 39.7, severe BAE, trivial MR, mod TR, trivial AI, mild  AS (mean 8), ascending aorta 40 mm  ? Atrial fibrillation ([place])   ? a. permanent;  b. Hematuria on Pradaxa;  c. cold intol and chills with Xarelto  ? Bladder cancer ([place])   ? Cardiomyopathy ([place])   ? a. Echo (03/2013): Inferior and inferolateral AK, EF 40-45%, mild MR, mod LAE, mod RAE, mod TR, mild PI, PASP 46  ? Chronic kidney disease   ? COPD (chronic obstructive  pulmonary disease) ([place])   ? Coronary artery disease s/p cabg x7  1992  ? a. s/p CABG in 1992 (X 7 - Dr. Redmond Pulling);  b.  Myoview (01/2011): Fixed inferior defect suggestive of prior MI versus diaphragmatic attenuation, apical reversibility possibly suspicious for small area of infarction with peri-infarct ischemia, no significant change from prior study, not gated, low risk study;  c.  Echo (05/2010): Mod LVH, EF 55-65%, mild AI, mild MR, moderate LAE, mild RAE, moderate   ? Hematuria bladder tumor ---  followed by Dr Karsten Ro  ? Hiatal hernia   ? repaired in 1992 w/[facility]  ? Hx of cardiovascular stress test   ? Lexiscan Myoview (03/2013):  No ischemia; not gated.  ? Hyperglycemia diet controlled  ? Hyperlipidemia   ? Hypertension   ? Hypothyroidism   ? Obesity   ? Organic impotence   ? RBBB   ? S/P AAA repair using bifurcation graft   ? Sleep apnea no rx cpap  ? mild- tested  2012  ? Ventricular ectopy   ? mild  ?   ? ?PAST SURGICAL HISTORY: ?Past Surgical History:  ?Procedure Laterality Date  ? ABDOMINAL AORTIC ANEURYSM REPAIR  02/28/2010  ? using bifurcation graft  ? CARDIAC CATHETERIZATION  03/31/1990  ? Positive  ? CARDIOVASCULAR STRESS TEST  02-01-2011  NUCLEAR NO EXERCISE  ? LOW RISK STUDY.  SMALL APICAL DEFECT. NO EVIDENCE OF MULTIZONE ISCHEMIA.  ? CORONARY ARTERY BYPASS GRAFT  05/30/1990  ? CABG X 7  ? ENDOVASCULAR STENT INSERTION  02/15/2011  ? Procedure: ENDOVASCULAR STENT GRAFT INSERTION;  Surgeon: Hinda Lenis, MD;  Location: [facility];  Service: Vascular;  Laterality: N/A;  Insertion of endovascular stent graft   ? ETT  10/31/2000  ? WNL  ? HIATAL HERNIA REPAIR  02/28/1990  ? "repaired when I had my [facility]"  ? Hospital - Afib  03/28/2002  ? INGUINAL HERNIA REPAIR Left 03/01/1995  ? TOTAL ANKLE ARTHROPLASTY Right 04/17/2014  ? Procedure: RIGHT TOTAL ANKLE ARTHOPLASTY;  Surgeon: Wylene Simmer, MD;  Location: [facility];  Service: Orthopedics;  Laterality: Right;  ? TRANSURETHRAL RESECTION OF BLADDER TUMOR  03/25/2011   ? Procedure: TRANSURETHRAL RESECTION OF BLADDER TUMOR (TURBT);  Surgeon: Claybon Jabs, MD;  Location: [facility];  Service: Urology;  Laterality: N/A;  ? ? ?FAMILY HISTORY:  ?Family History  ?Problem Relation Age of Onset  ? Obesity Mother 66  ?     deceased  ? Hypertension Mother   ? Diabetes Mother   ? Heart disease Mother   ?     before age 68  ? Hyperlipidemia Mother   ? Alcohol abuse Sister 40  ?     deceased  ? Other Father   ?     deceased unknown  ? Colon cancer Neg Hx   ? Prostate cancer Neg Hx   ? Anesthesia problems Neg Hx   ? ? ?SOCIAL HISTORY:  ?Social History  ? ?Socioeconomic History  ? Marital status: Widowed  ?  Spouse name: Not  on file  ? Number of children: Not on file  ? Years of education: Not on file  ? Highest education level: Not on file  ?Occupational History  ? Occupation: retired Pension scheme manager.  ?  Employer: retired  ?  Comment: 2001. Likes to play golf . Travels with motor home  ?Tobacco Use  ? Smoking status: Former  ?  Packs/day: 1.25  ?  Years: 30.00  ?  Pack years: 37.50  ?  Types: Cigarettes  ?  Quit date: 02/28/1990  ?  Years since quitting: 31.3  ? Smokeless tobacco: Never  ?Vaping Use  ? Vaping Use: Never used  ?Substance and Sexual Activity  ? Alcohol use: No  ?  Alcohol/week: 0.0 standard drinks  ?  Comment: basically no alcohol use.  only very rare alcohol use.   ? Drug use: No  ? Sexual activity: Yes  ?Other Topics Concern  ? Not on file  ?Social History Narrative  ? From [place]  ? Lives with girlfriend.    ? Divorced, remarried then widowed, 3 children (from 1st marriage)  ? Likes to golf  ? Prev 4 sport athlete in HS, football fan.   ? ?Social Determinants of Health  ? ?Financial Resource Strain: Low Risk   ? Difficulty of Paying Living Expenses: Not hard at all  ?Food Insecurity: No Food Insecurity  ? Worried About Charity fundraiser in the Last Year: Never true  ? Ran Out of Food in the Last Year: Never true  ?Transportation Needs: No  Transportation Needs  ? Lack of Transportation (Medical): No  ? Lack of Transportation (Non-Medical): No  ?Physical Activity: Insufficiently Active  ? Days of Exercise per Week: 4 days  ? Minutes of Exercise per [facility]

## 2021-07-14 ENCOUNTER — Telehealth: Payer: Self-pay | Admitting: Family Medicine

## 2021-07-14 NOTE — Telephone Encounter (Signed)
Patient has called stating he is not getting good service with his on-going prostate cancer requests. He wants to speak with the provider or the nurse about going to [facility]. He states this has been a problem since march and he isn't getting any better. Please advise  ?

## 2021-07-15 NOTE — Telephone Encounter (Signed)
Called patient. He states everything has been cleared up this morning with his doctors and he no longer wishes to change providers. Nothing further is needed.

## 2021-07-16 NOTE — Anesthesia Preprocedure Evaluation (Addendum)
Anesthesia Evaluation  Patient identified by MRN, date of birth, ID band Patient awake    Reviewed: Allergy & Precautions, NPO status , Patient's Chart, lab work & pertinent test results  Airway Mallampati: II  TM Distance: >3 FB Neck ROM: Full    Dental  (+) Edentulous Lower, Edentulous Upper   Pulmonary sleep apnea , COPD, former smoker,    Pulmonary exam normal breath sounds clear to auscultation       Cardiovascular hypertension, Pt. on medications + CAD and +CHF  + dysrhythmias (on eliquis) Atrial Fibrillation + Valvular Problems/Murmurs AS  Rhythm:Irregular Rate:Normal  Last EKG showed afib rate in the 80s   Neuro/Psych negative neurological ROS  negative psych ROS   GI/Hepatic Neg liver ROS, hiatal hernia,   Endo/Other  Hypothyroidism Obesity   Renal/GU Renal disease   hematuria    Musculoskeletal  (+) Arthritis , Osteoarthritis,    Abdominal   Peds negative pediatric ROS (+)  Hematology negative hematology ROS (+)   Anesthesia Other Findings   Reproductive/Obstetrics negative OB ROS                           Anesthesia Physical Anesthesia Plan  ASA: 4  Anesthesia Plan: General   Post-op Pain Management:    Induction: Intravenous  PONV Risk Score and Plan: 2  Airway Management Planned: LMA  Additional Equipment: None  Intra-op Plan:   Post-operative Plan: Extubation in OR  Informed Consent:     Dental advisory given  Plan Discussed with: CRNA, Anesthesiologist and Surgeon  Anesthesia Plan Comments:       Anesthesia Quick Evaluation

## 2021-07-16 NOTE — Progress Notes (Signed)
Anesthesia Chart Review   Case: [id number] Date/Time: 07/23/21 0715   Procedures:      CYSTOSCOPY WITH  BLADDER BIOPSY     TRANSURETHRAL RESECTION OF BLADDER TUMOR (TURBT) POSSIBLE - ONLY NEEDS 30 MIN   Anesthesia type: General   Pre-op diagnosis: BLADDER CANCER   Location: [facility]   Surgeons: Janith Lima, MD       DISCUSSION:88 y.o. former smoker with h/o HTN, sleep apnea, CAD (CABG 1992), cardiomyopathy, atrial fibrillation (on Eliquis), CKD, s/p AAA repair, mild aortic stenosis, COPD, bladder cancer scheduled for above procedure 07/23/2021 with Dr. Rexene Alberts.   Pt seen by cardiology 06/24/2021 for preoperative evaluation.  Per OV note, "[name]'s perioperative risk of a major cardiac event is 0.9% according to the Revised Cardiac Risk Index (RCRI).  Therefore, he is at low risk for perioperative complications.   His functional capacity is good at 6.18 METs according to the Duke Activity Status Index (DASI). Recommendations: According to ACC/AHA guidelines, no further cardiovascular testing needed.  The patient may proceed to surgery at acceptable risk.  A copy of recommendations will be forwarded to the requesting surgeon and medical group. Antiplatelet and/or Anticoagulation Recommendations: N/A Eliquis (Apixaban) can be held for 3 days prior to surgery.  Please resume post op when felt to be safe."  Anticipate pt can proceed with planned procedure barring acute status change.   VS: BP (!) 154/89   Pulse 79   Temp 36.7 C (Oral)   Resp 20   Ht '5\' 11"'$  (1.803 m)   Wt 115.2 kg   SpO2 97%   BMI 35.43 kg/m   PROVIDERS: Tonia Ghent, MD is PCP    Primary Cardiologist:  Sherren Mocha, MD  LABS: Labs reviewed: Acceptable for surgery. (all labs ordered are listed, but only abnormal results are displayed)  Labs Reviewed  BASIC METABOLIC PANEL - Abnormal; Notable for the following components:      Result Value   Glucose, Bld 105 (*)    All other  components within normal limits  CBC     IMAGES:   EKG: 06/24/2021 Rate 86 bpm  Atrial fibrillation  LAD RBBB  CV: Myocardial Perfusion 09/24/2019 The left ventricular ejection fraction is normal (55-65%). Nuclear stress EF: 60%. Blood pressure was markedly elevated at baseline. There was no ST segment deviation noted during stress. The study is normal. This is a low risk study. Defect 1: There is a small defect of moderate severity present in the apical inferior location. This defect is fixed and likely due to diaphragmatic attenuatiion. There is no ischemia    Echo 12/09/2020 1. Left ventricular ejection fraction, by estimation, is 55 to 60%. The  left ventricle has normal function. The left ventricle has no regional  wall motion abnormalities. Left ventricular diastolic function could not  be evaluated.   2. Right ventricular systolic function is mildly reduced. The right  ventricular size is normal. There is mildly elevated pulmonary artery  systolic pressure.   3. Left atrial size was severely dilated.   4. Right atrial size was severely dilated.   5. The mitral valve is normal in structure. Trivial mitral valve  regurgitation. No evidence of mitral stenosis.   6. Tricuspid valve regurgitation is moderate.   7. The aortic valve is tricuspid. There is moderate calcification of the  aortic valve. Aortic valve regurgitation is trivial. Mild aortic valve  stenosis.   8. Aortic dilatation noted. There is mild dilatation  of the ascending  aorta, measuring 40 mm.   9. The inferior vena cava is normal in size with greater than 50%  respiratory variability, suggesting right atrial pressure of 3 mmHg.  Past Medical History:  Diagnosis Date   AAA (abdominal aortic aneurysm) ([place])    s/p stent graft repair 2012   Arthritis    "right ankle" (04/17/2014)   Ascending aorta dilatation ([place])    Echo 2021: 42 mm // Echocardiogram 10/22: EF 55-60, no RWMA, mildly reduced RVSF,  RVSP 39.7, severe BAE, trivial MR, mod TR, trivial AI, mild AS (mean 8), ascending aorta 40 mm   Atrial fibrillation ([facility])    a. permanent;  b. Hematuria on Pradaxa;  c. cold intol and chills with Xarelto   Bladder cancer ([place])    Cardiomyopathy ([place])    a. Echo (03/2013): Inferior and inferolateral AK, EF 40-45%, mild MR, mod LAE, mod RAE, mod TR, mild PI, PASP 46   Chronic kidney disease    COPD (chronic obstructive pulmonary disease) ([facility])    Coronary artery disease s/p cabg x7  1992   a. s/p CABG in 1992 (X 7 - Dr. Redmond Pulling);  b.  Myoview (01/2011): Fixed inferior defect suggestive of prior MI versus diaphragmatic attenuation, apical reversibility possibly suspicious for small area of infarction with peri-infarct ischemia, no significant change from prior study, not gated, low risk study;  c.  Echo (05/2010): Mod LVH, EF 55-65%, mild AI, mild MR, moderate LAE, mild RAE, moderate    Hematuria bladder tumor ---  followed by Dr Karsten Ro   Hiatal hernia    repaired in 1992 w/[facility]   Hx of cardiovascular stress test    Lexiscan Myoview (03/2013):  No ischemia; not gated.   Hyperglycemia diet controlled   Hyperlipidemia    Hypertension    Hypothyroidism    Obesity    Organic impotence    RBBB    S/P AAA repair using bifurcation graft    Sleep apnea no rx cpap   mild- tested  2012   Ventricular ectopy    mild    Past Surgical History:  Procedure Laterality Date   ABDOMINAL AORTIC ANEURYSM REPAIR  02/28/2010   using bifurcation graft   CARDIAC CATHETERIZATION  03/31/1990   Positive   CARDIOVASCULAR STRESS TEST  02-01-2011  NUCLEAR NO EXERCISE   LOW RISK STUDY.  SMALL APICAL DEFECT. NO EVIDENCE OF MULTIZONE ISCHEMIA.   CORONARY ARTERY BYPASS GRAFT  05/30/1990   CABG X 7   ENDOVASCULAR STENT INSERTION  02/15/2011   Procedure: ENDOVASCULAR STENT GRAFT INSERTION;  Surgeon: Hinda Lenis, MD;  Location: [facility];  Service: Vascular;  Laterality: N/A;  Insertion of endovascular stent graft     ETT  10/31/2000   WNL   HIATAL HERNIA REPAIR  02/28/1990   "repaired when I had my O[facility]"   Hospital - Afib  03/28/2002   INGUINAL HERNIA REPAIR Left 03/01/1995   TOTAL ANKLE ARTHROPLASTY Right 04/17/2014   Procedure: RIGHT TOTAL ANKLE ARTHOPLASTY;  Surgeon: Wylene Simmer, MD;  Location: [facility];  Service: Orthopedics;  Laterality: Right;   TRANSURETHRAL RESECTION OF BLADDER TUMOR  03/25/2011   Procedure: TRANSURETHRAL RESECTION OF BLADDER TUMOR (TURBT);  Surgeon: Claybon Jabs, MD;  Location: [facility];  Service: Urology;  Laterality: N/A;    MEDICATIONS:  Aromatic Inhalants (VICKS VAPOINHALER IN)   atorvastatin (LIPITOR) 40 MG tablet   ELIQUIS 5 MG TABS tablet   finasteride (PROSCAR) 5 MG tablet  levothyroxine (SYNTHROID) 137 MCG tablet   ramipril (ALTACE) 5 MG capsule   sildenafil (REVATIO) 20 MG tablet   tamsulosin (FLOMAX) 0.4 MG CAPS capsule   No current facility-administered medications for this encounter.    Konrad Felix Ward, PA-C [facility] [phone]

## 2021-07-19 ENCOUNTER — Telehealth: Payer: Self-pay | Admitting: Oncology

## 2021-07-19 NOTE — Telephone Encounter (Signed)
Scheduled appt per 5/22 referral. Pt is aware of appt date and time. Pt is aware to arrive 15 mins prior to appt time and to bring and updated insurance card. Pt is aware of appt location.   

## 2021-07-22 NOTE — H&P (Signed)
CC/HPI: [name] is a 88 year old male seen in followup today for metastatic prostate cancer and history of LG Ta UCB.   1. Metastatic prostate cancer (M1a):  -CT hematuria protocol 05/13/2021 demonstrated enlarged prominent pelvic and retroperitoneal lymph nodes which are new since December 16, 2016 which are nonspecific however are suspicious for metastatic nodal involvement.  -S/p IR guided biopsy that resulted metastatic carcinoma positive for PSA consistent with PSA primary.  -Bone scan 06/17/2021: Single focus of increased uptake in the posterior right parietal bone, no corresponding abnormality on previous head CT 02/2013  -CT chest 06/18/2021: Atypical nodularity in right upper lobe, likely inflammation or infection, unusual for metastatic disease or primary, recommended follow-up CT in 3 to 6 months.  -PSA 06/16/2021: 24.8.  -Treatment: He was given Firmagon 240 mg on 06/23/2021  -He denies bone pain unexpected weight loss. He has stable appetite.   #2. Low risk nonmuscle invasive bladder cancer:  -S/p TURBT in 2013 with LG Ta UCB.  -He did undergo surveillance cystoscopy annually with last cystoscopy in 09/2017 by Dr. Karsten Ro. Cystoscopy 05/25/2021 NED.  He does have a significant smoking history smoking approximately 30 packs/day. He has quit smoking approximate 30 years ago. He denies a family history of bladder cancer.  -CT hematuria protocol 05/13/2021 demonstrated enlarged prominent pelvic and retroperitoneal lymph nodes which are new since December 16, 2016 which are nonspecific however are suspicious for metastatic nodal involvement.  -Cystoscopy 05/25/2021 demonstrated elongated prostatic urethra with friable prostate, some difficult visibility in the bladder however no suspicious papillary lesion.  -Urine cytology 04/2021: Suspicious for high-grade urothelial carcinoma.   #3. BPH/LUTS: His IPSS score today is 15, quality-of-life 3. He does report sensation of incomplete bladder  emptying, frequency, intermittency, weak flow stream and 3 time nocturia.  -CT A/P 05/13/2021 demonstrates 8.4 x 8.1 x 6.8 cm prostate equating to roughly 240 cc prostate.  -He was started on tamsulosin and finasteride in 04/2021 and has seen some improvement.   4. Erectile dysfunction: He does report difficulty obtaining maintain erection satisfactory for intercourse. He does use sildenafil 100 mg to some success. He denies taking nitroglycerin.   He does have a significant cardiac history and has had a CABG. he does take Eliquis for history of A-fib.   Patient currently denies fever, chills, sweats, nausea, vomiting, abdominal or flank pain, gross hematuria or dysuria.     ALLERGIES: No Allergies    MEDICATIONS: Finasteride 5 mg tablet 1 tablet PO Daily  Tamsulosin Hcl 0.4 mg capsule 1 capsule PO Q HS  Atorvastatin Calcium 40 mg tablet  Eliquis 5 mg tablet  Levothyroxine 137 mcg capsule  Ramipril 5 mg capsule     GU PSH: Cystoscopy - 05/25/2021, 2019, 2018, 2017 Cystoscopy TURBT <2 cm - 2013 Locm 300-'399Mg'$ /Ml Iodine,1Ml - 06/18/2021, 05/13/2021       PSH Notes: Cystoscopy With Fulguration Small Lesion (5-54m), Endovasc Repair Aort Aneurysm Infrarenal Aorto-Aortic Tube, Hernia Repair, CABG (CABG)   NON-GU PSH: Abdominal Aortic Aneurysm Repair - 2013 CABG (coronary artery bypass grafting) - 2012 Hernia Repair - 2012     GU PMH: BPH w/LUTS - 05/25/2021, - 04/28/2021, Benign prostatic hyperplasia with urinary obstruction, - 2015 Gross hematuria - 05/25/2021, - 05/13/2021, - 04/28/2021 History of bladder cancer - 05/25/2021, - 04/28/2021 (Stable), He had no evidence of recurrent bladder cancer noted today on cystoscopy. Been 6 years since he was found to have a single, low-grade, superficial tumor resected. I told him I thought it was safe to  go to every other year surveillance cystoscopy from here out., - 2019 (Stable), He has no evidence of recurrent transitional cell carcinoma. He will return  again in one year for repeat surveillance cystoscopy., - 2018 (Stable), He had no evidence of recurrent transitional cell carcinoma of the bladder noted on cystoscopy today., - 2017, History of bladder cancer, - 2016 Weak Urinary Stream - 05/25/2021, - 04/28/2021 Nocturia - 04/28/2021 BPH w/o LUTS (Stable), He has a markedly enlarged prostate but is having minimal voiding symptoms. - 2018 Male ED, unspecified, Erectile dysfunction - 2015 Renal cyst, Renal cyst, acquired, left - 2014      PMH Notes: Transitional cell carcinoma the bladder: He experienced gross hematuria and was evaluated with CT scan which revealed no abnormality of the upper tracts. Cystoscopically I found an obvious papillary tumor involving the right trigonal region just proximal to the right ureteral orifice. He underwent a transurethral resection of the lesion in 1/13 and due to its small size the resection resulted in obliteration of the tumor and therefore the pathology revealed no definite TCCa however clinically this was obviously a small, superficial lesion.  CT scan with contrast 3/15: No abnormality of the upper tract.    He does have some mild obstructive symptoms consisting of slowing of his urinary stream as well as nocturia but that's been stable for a number of years.   Bilateral renal cysts: He was noted to have bilateral renal cysts on a CT scan done on 05/03/13. There appeared simple and has remained stable over time.    Organic erectile dysfunction: This is been managed with Viagra at 50 mg.     NON-GU PMH: Secondary malignant neoplasm of retroperitoneum and peritoneum - 06/18/2021, - 05/25/2021 Other pruritus, Jock itch - 2016 Encounter for general adult medical examination without abnormal findings, Encounter for preventive health examination - 2015 Personal history of other diseases of the circulatory system, History of hypertension - 2014, History of cardiac disorder, - 2014, History of aortic aneurysm, -  2014 Personal history of other diseases of the nervous system and sense organs, History of sleep apnea - 2014 Personal history of other endocrine, nutritional and metabolic disease, History of hyperlipidemia - 2014 Unspecified atrial fibrillation, Atrial Fibrillation - 2014 Atrial Fibrillation Hypercholesterolemia Hypertension Sleep Apnea    FAMILY HISTORY: 2 daughters - Runs in Family 1 son - Runs in Family Diabetes - Mother Father Deceased At Age81 ___ - Runs In [place] - Mother, Father Mother Deceased At Age 74 from diabetic complicati - Runs In Family   SOCIAL HISTORY: Marital Status: Widowed Preferred Language: English; Ethnicity: Not Hispanic Or Latino; Race: White Current Smoking Status: Patient does not smoke anymore. Has not smoked since 04/29/1990. Smoked for 30 years.   Tobacco Use Assessment Completed: Used Tobacco in last 30 days? Has never drank.  Drinks 1 caffeinated drink per day. Patient's occupation is/was retired.     Notes: Former smoker, Marital History - Currently Married   REVIEW OF SYSTEMS:    GU Review Male:   Patient denies frequent urination, hard to postpone urination, burning/ pain with urination, get up at night to urinate, leakage of urine, stream starts and stops, trouble starting your stream, have to strain to urinate , erection problems, and penile pain.  Gastrointestinal (Upper):   Patient denies nausea, vomiting, and indigestion/ heartburn.  Gastrointestinal (Lower):   Patient denies diarrhea and constipation.  Constitutional:   Patient denies fever, night sweats, weight loss, and fatigue.  Skin:  Patient denies skin rash/ lesion and itching.  Eyes:   Patient denies blurred vision and double vision.  Ears/ Nose/ Throat:   Patient denies sore throat and sinus problems.  Hematologic/Lymphatic:   Patient denies swollen glands and easy bruising.  Cardiovascular:   Patient denies leg swelling and chest pains.  Respiratory:   Patient  denies cough and shortness of breath.  Endocrine:   Patient denies excessive thirst.  Musculoskeletal:   Patient denies back pain and joint pain.  Neurological:   Patient denies headaches and dizziness.  Psychologic:   Patient denies depression and anxiety.   VITAL SIGNS: None   MULTI-SYSTEM PHYSICAL EXAMINATION:    Constitutional: Well-nourished. No physical deformities. Normally developed. Good grooming.  Respiratory: No labored breathing, no use of accessory muscles.   Cardiovascular: Normal temperature, normal extremity pulses, no swelling, no varicosities.  Gastrointestinal: No mass, no tenderness, no rigidity, non obese abdomen.     Complexity of Data:  Source Of History:  Patient, Medical Record Summary  Lab Test Review:   PSA  Records Review:   Previous Doctor Records  Urine Test Review:   Urinalysis  X-Ray Review: C.T. Abdomen/Pelvis: Reviewed Films. Reviewed Report. Discussed With Patient.  C.T. Chest: Reviewed Films. Reviewed Report. Discussed With Patient.  Bone Scan: Reviewed Films. Reviewed Report. Discussed With Patient.     06/16/21 06/05/15 01/25/11  PSA  Total PSA 24.80 ng/mL 0.61 ng/dl 0.54     PROCEDURES:          Urinalysis w/Scope Dipstick Dipstick Cont'd Micro  Color: Yellow Bilirubin: Neg mg/dL WBC/hpf: 0 - 5/hpf  Appearance: Clear Ketones: Neg mg/dL RBC/hpf: 0 - 2/hpf  Specific Gravity: 1.020 Blood: 2+ ery/uL Bacteria: Mod (26-50/hpf)  pH: <=5.0 Protein: Trace mg/dL Cystals: NS (Not Seen)  Glucose: Neg mg/dL Urobilinogen: 0.2 mg/dL Casts: NS (Not Seen)    Nitrites: Neg Trichomonas: Not Present    Leukocyte Esterase: Neg leu/uL Mucous: Present      Epithelial Cells: NS (Not Seen)      Yeast: NS (Not Seen)      Sperm: Not Present         Firmagon '240mg'$  - [id number], [id number] Pt was prepped and cleaned. Firmagon '240mg'$  was injected SQ in the LLQ and LRQ. Pt tolerated procedure well. Sites were dry when pt left the room.    Qty: 240 Adm. By: Rene Paci  Unit: mg Lot No [id number]  Route: SQ Exp. Date 11/29/2022  Freq: None Mfgr.:   Site: LLQ and XBD   ASSESSMENT:      ICD-10 Details  1 GU:   Prostate Cancer - C61   3   Bladder Cancer overlapping sites - C67.8   4   Weak Urinary Stream - R39.12   5   BPH w/LUTS - N40.1   2 NON-GU:   Metastatic lymphadenopathy of multiple regions - C77.8    PLAN:           Orders         Schedule Labs: 1 Month - Total Testosterone    1 Month - PSA  X-Rays: Outside CT Without I.V. Contrast - CT head          Document Letter(s):  Created for Patient: Clinical Summary         Notes:   1. Metastatic prostate cancer (M1a):  -I reviewed recent imaging findings and biopsy findings consistent with metastatic prostate cancer.  -We discussed that treatment consists of ADT. We also discussed possible addition  of oral antiandrogens.  -We discussed that he may have oligometastatic disease as his CT A/P 04/2021 demonstrated enlarged pelvic and retroperitoneal lymph nodes which I can count approximately 3.  -I will refer him to Dr. Tammi Klippel with radiation oncology to consider radiation therapy to his prostate and these nodes.  -We discussed that there is no definitive evidence at this time of any bone metastasis. Bone scan was indeterminant on his parietal skull. I will obtain CT head to further evaluate.  -F/u in 1 month for Eligard inection   #2. Low risk nonmuscle invasive bladder cancer: S/p TURBT in 2013. CT hematuria protocol 05/13/2021 with no evidence of localized bladder cancer. Cystoscopy in 04/2021 NED. Urine cytology was positive. I recommend cystoscopy, bladder biopsy in the operating room. Surgery letter has already been sent. He will obtain clearance to hold anticoagulation.   #3. BPH is LUTS: Prostate volume by CT 04/2021 is 240 cc. Continue tamsulosin and finasteride.   4. Erectile dysfunction: Continue sildenafil.   CC: Renford Dills, MD        Next Appointment:      Next  Appointment: 07/23/2021 07:30 AM    Appointment Type: Surgery     Location: [facility] [phone]    Provider: Rexene Alberts, M.D.    Reason for Visit: [facility]/OP CYSTO, BL BX, POSSIBLE TURBT    Urology Preoperative H&P   Chief Complaint: Gross hematuria, positive bladder cytology  History of Present Illness: SHAY BARTOLI is a 88 y.o. male with gross hematuria, positive bladder cytology, history of bladder cancer here for cysto, bladder biopsy, possibly TURBT, bilateral retrograde pyelograms. Denies fevers or chills. Has had some mild gross hematuria, now clear yellow.   Past Medical History:  Diagnosis Date   AAA (abdominal aortic aneurysm) ([place])    s/p stent graft repair 2012   Arthritis    "right ankle" (04/17/2014)   Ascending aorta dilatation ([place])    Echo 2021: 42 mm // Echocardiogram 10/22: EF 55-60, no RWMA, mildly reduced RVSF, RVSP 39.7, severe BAE, trivial MR, mod TR, trivial AI, mild AS (mean 8), ascending aorta 40 mm   Atrial fibrillation ([facility])    a. permanent;  b. Hematuria on Pradaxa;  c. cold intol and chills with Xarelto   Bladder cancer ([place])    Cardiomyopathy ([place])    a. Echo (03/2013): Inferior and inferolateral AK, EF 40-45%, mild MR, mod LAE, mod RAE, mod TR, mild PI, PASP 46   Chronic kidney disease    COPD (chronic obstructive pulmonary disease) (HCC)    Coronary artery disease s/p cabg x7  1992   a. s/p CABG in 1992 (X 7 - Dr. Redmond Pulling);  b.  Myoview (01/2011): Fixed inferior defect suggestive of prior MI versus diaphragmatic attenuation, apical reversibility possibly suspicious for small area of infarction with peri-infarct ischemia, no significant change from prior study, not gated, low risk study;  c.  Echo (05/2010): Mod LVH, EF 55-65%, mild AI, mild MR, moderate LAE, mild RAE, moderate    Hematuria bladder tumor ---  followed by Dr Karsten Ro   Hiatal hernia    repaired in 1992 w/OHS   Hx of cardiovascular stress test    Lexiscan Myoview  (03/2013):  No ischemia; not gated.   Hyperglycemia diet controlled   Hyperlipidemia    Hypertension    Hypothyroidism    Obesity    Organic impotence    RBBB    S/P AAA repair using bifurcation graft    Sleep  apnea no rx cpap   mild- tested  2012   Ventricular ectopy    mild    Past Surgical History:  Procedure Laterality Date   ABDOMINAL AORTIC ANEURYSM REPAIR  02/28/2010   using bifurcation graft   CARDIAC CATHETERIZATION  03/31/1990   Positive   CARDIOVASCULAR STRESS TEST  02-01-2011  NUCLEAR NO EXERCISE   LOW RISK STUDY.  SMALL APICAL DEFECT. NO EVIDENCE OF MULTIZONE ISCHEMIA.   CORONARY ARTERY BYPASS GRAFT  05/30/1990   CABG X 7   ENDOVASCULAR STENT INSERTION  02/15/2011   Procedure: ENDOVASCULAR STENT GRAFT INSERTION;  Surgeon: Hinda Lenis, MD;  Location: [place];  Service: Vascular;  Laterality: N/A;  Insertion of endovascular stent graft    ETT  10/31/2000   WNL   HIATAL HERNIA REPAIR  02/28/1990   "repaired when I had my OHS"   Hospital - Afib  03/28/2002   INGUINAL HERNIA REPAIR Left 03/01/1995   TOTAL ANKLE ARTHROPLASTY Right 04/17/2014   Procedure: RIGHT TOTAL ANKLE ARTHOPLASTY;  Surgeon: Wylene Simmer, MD;  Location: [facility];  Service: Orthopedics;  Laterality: Right;   TRANSURETHRAL RESECTION OF BLADDER TUMOR  03/25/2011   Procedure: TRANSURETHRAL RESECTION OF BLADDER TUMOR (TURBT);  Surgeon: Claybon Jabs, MD;  Location[facility];  Service: Urology;  Laterality: N/A;    Allergies:  Allergies  Allergen Reactions   Dabigatran Etexilate Mesylate     Blood in urine   Xarelto [Rivaroxaban] Shortness Of Breath and Other (See Comments)    SOB, CHILLS   Metoprolol     Upset stomach    Family History  Problem Relation Age of Onset   Obesity Mother 52       deceased   Hypertension Mother    Diabetes Mother    Heart disease Mother        before age 60   Hyperlipidemia Mother    Alcohol abuse Sister 86       deceased   Other Father         deceased unknown   Colon cancer Neg Hx    Prostate cancer Neg Hx    Anesthesia problems Neg Hx     Social History:  reports that he quit smoking about 31 years ago. His smoking use included cigarettes. He has a 37.50 pack-year smoking history. He has never used smokeless tobacco. He reports that he does not drink alcohol and does not use drugs.  ROS: A complete review of systems was performed.  All systems are negative except for pertinent findings as noted.  Physical Exam:  Vital signs in last 24 hours:   Constitutional:  Alert and oriented, No acute distress Cardiovascular: Regular rate and rhythm Respiratory: Normal respiratory effort, Lungs clear bilaterally GI: Abdomen is soft, nontender, nondistended, no abdominal masses GU: No CVA tenderness Lymphatic: No lymphadenopathy Neurologic: Grossly intact, no focal deficits Psychiatric: Normal mood and affect  Laboratory Data:  No results for input(s): WBC, HGB, HCT, PLT in the last 72 hours.  No results for input(s): NA, K, CL, GLUCOSE, BUN, CALCIUM, CREATININE in the last 72 hours.  Invalid input(s): CO3   No results found for this or any previous visit (from the past 24 hour(s)). No results found for this or any previous visit (from the past 240 hour(s)).  Renal Function: No results for input(s): CREATININE in the last 168 hours. CrCl cannot be calculated (Unknown ideal weight.).  Radiologic Imaging: No results found.  I independently reviewed the above imaging  studies.  Assessment and Plan ANNIE ROSEBOOM is a 88 y.o. male with gross hematuria, positive bladder cytology, history of bladder cancer here for cysto, bladder biopsy, possibly TURBT, bilateral retrograde pyelograms.    Matt R. Skylor Schnapp MD 07/22/2021, 9:36 PM  [facility] Pager: [phone]): [phone]

## 2021-07-23 ENCOUNTER — Ambulatory Visit (HOSPITAL_COMMUNITY): Payer: Medicare Other

## 2021-07-23 ENCOUNTER — Encounter (HOSPITAL_COMMUNITY): Payer: Self-pay | Admitting: Urology

## 2021-07-23 ENCOUNTER — Ambulatory Visit (HOSPITAL_COMMUNITY): Payer: Medicare Other | Admitting: Physician Assistant

## 2021-07-23 ENCOUNTER — Encounter (HOSPITAL_COMMUNITY)
Admission: RE | Disposition: A | Discharge status: Home / Self Care | Payer: Self-pay | Source: Ambulatory Visit | Attending: Urology

## 2021-07-23 ENCOUNTER — Ambulatory Visit (HOSPITAL_BASED_OUTPATIENT_CLINIC_OR_DEPARTMENT_OTHER): Payer: Medicare Other | Admitting: Anesthesiology

## 2021-07-23 ENCOUNTER — Ambulatory Visit (HOSPITAL_COMMUNITY)
Admission: RE | Admit: 2021-07-23 | Discharge: 2021-07-23 | Disposition: A | Discharge status: Home / Self Care | Payer: Medicare Other | Source: Ambulatory Visit | Attending: Urology | Admitting: Urology

## 2021-07-23 DIAGNOSIS — R310 Gross hematuria: Secondary | ICD-10-CM | POA: Diagnosis not present

## 2021-07-23 DIAGNOSIS — I4891 Unspecified atrial fibrillation: Secondary | ICD-10-CM | POA: Insufficient documentation

## 2021-07-23 DIAGNOSIS — N400 Enlarged prostate without lower urinary tract symptoms: Secondary | ICD-10-CM | POA: Diagnosis not present

## 2021-07-23 DIAGNOSIS — Z951 Presence of aortocoronary bypass graft: Secondary | ICD-10-CM | POA: Diagnosis not present

## 2021-07-23 DIAGNOSIS — D090 Carcinoma in situ of bladder: Secondary | ICD-10-CM | POA: Diagnosis not present

## 2021-07-23 DIAGNOSIS — M1990 Unspecified osteoarthritis, unspecified site: Secondary | ICD-10-CM | POA: Diagnosis not present

## 2021-07-23 DIAGNOSIS — C7982 Secondary malignant neoplasm of genital organs: Secondary | ICD-10-CM | POA: Diagnosis not present

## 2021-07-23 DIAGNOSIS — Z87891 Personal history of nicotine dependence: Secondary | ICD-10-CM | POA: Diagnosis not present

## 2021-07-23 DIAGNOSIS — E039 Hypothyroidism, unspecified: Secondary | ICD-10-CM | POA: Diagnosis not present

## 2021-07-23 DIAGNOSIS — C801 Malignant (primary) neoplasm, unspecified: Secondary | ICD-10-CM

## 2021-07-23 DIAGNOSIS — N189 Chronic kidney disease, unspecified: Secondary | ICD-10-CM | POA: Diagnosis not present

## 2021-07-23 DIAGNOSIS — Z7901 Long term (current) use of anticoagulants: Secondary | ICD-10-CM | POA: Insufficient documentation

## 2021-07-23 DIAGNOSIS — C778 Secondary and unspecified malignant neoplasm of lymph nodes of multiple regions: Secondary | ICD-10-CM | POA: Diagnosis not present

## 2021-07-23 DIAGNOSIS — I130 Hypertensive heart and chronic kidney disease with heart failure and stage 1 through stage 4 chronic kidney disease, or unspecified chronic kidney disease: Secondary | ICD-10-CM | POA: Diagnosis not present

## 2021-07-23 DIAGNOSIS — I509 Heart failure, unspecified: Secondary | ICD-10-CM | POA: Insufficient documentation

## 2021-07-23 DIAGNOSIS — I350 Nonrheumatic aortic (valve) stenosis: Secondary | ICD-10-CM | POA: Insufficient documentation

## 2021-07-23 DIAGNOSIS — N401 Enlarged prostate with lower urinary tract symptoms: Secondary | ICD-10-CM | POA: Diagnosis not present

## 2021-07-23 DIAGNOSIS — I110 Hypertensive heart disease with heart failure: Secondary | ICD-10-CM | POA: Diagnosis not present

## 2021-07-23 DIAGNOSIS — R3912 Poor urinary stream: Secondary | ICD-10-CM | POA: Insufficient documentation

## 2021-07-23 DIAGNOSIS — C679 Malignant neoplasm of bladder, unspecified: Secondary | ICD-10-CM | POA: Diagnosis not present

## 2021-07-23 DIAGNOSIS — N138 Other obstructive and reflux uropathy: Secondary | ICD-10-CM | POA: Diagnosis not present

## 2021-07-23 DIAGNOSIS — K449 Diaphragmatic hernia without obstruction or gangrene: Secondary | ICD-10-CM | POA: Insufficient documentation

## 2021-07-23 DIAGNOSIS — I2510 Atherosclerotic heart disease of native coronary artery without angina pectoris: Secondary | ICD-10-CM | POA: Insufficient documentation

## 2021-07-23 DIAGNOSIS — I10 Essential (primary) hypertension: Secondary | ICD-10-CM

## 2021-07-23 DIAGNOSIS — N529 Male erectile dysfunction, unspecified: Secondary | ICD-10-CM | POA: Diagnosis not present

## 2021-07-23 DIAGNOSIS — E669 Obesity, unspecified: Secondary | ICD-10-CM | POA: Diagnosis not present

## 2021-07-23 DIAGNOSIS — C61 Malignant neoplasm of prostate: Secondary | ICD-10-CM | POA: Insufficient documentation

## 2021-07-23 DIAGNOSIS — J449 Chronic obstructive pulmonary disease, unspecified: Secondary | ICD-10-CM | POA: Diagnosis not present

## 2021-07-23 HISTORY — PX: CYSTOSCOPY WITH BIOPSY: SHX5122

## 2021-07-23 SURGERY — CYSTOSCOPY, WITH BIOPSY
Anesthesia: General | Site: Urethra

## 2021-07-23 MED ORDER — ACETAMINOPHEN 500 MG PO TABS
1000.0000 mg | ORAL_TABLET | Freq: Once | ORAL | Status: AC
  Administered 2021-07-23: 1000 mg via ORAL
  Filled 2021-07-23: qty 2

## 2021-07-23 MED ORDER — AMISULPRIDE (ANTIEMETIC) 5 MG/2ML IV SOLN: 10.0000 mg | Freq: Once | INTRAVENOUS | Status: DC | PRN

## 2021-07-23 MED ORDER — DEXAMETHASONE SODIUM PHOSPHATE 10 MG/ML IJ SOLN
INTRAMUSCULAR | Status: DC | PRN
  Administered 2021-07-23: 10 mg via INTRAVENOUS

## 2021-07-23 MED ORDER — OXYCODONE HCL 5 MG/5ML PO SOLN: 5.0000 mg | Freq: Once | ORAL | Status: DC | PRN

## 2021-07-23 MED ORDER — ORAL CARE MOUTH RINSE: 15.0000 mL | Freq: Once | OROMUCOSAL | Status: AC

## 2021-07-23 MED ORDER — FENTANYL CITRATE (PF) 100 MCG/2ML IJ SOLN
INTRAMUSCULAR | Status: DC | PRN
  Administered 2021-07-23 (×2): 50 ug via INTRAVENOUS

## 2021-07-23 MED ORDER — PROPOFOL 10 MG/ML IV BOLUS
INTRAVENOUS | Status: AC
  Filled 2021-07-23: qty 20

## 2021-07-23 MED ORDER — IOHEXOL 300 MG/ML  SOLN
INTRAMUSCULAR | Status: DC | PRN
  Administered 2021-07-23: 10 mL via URETHRAL

## 2021-07-23 MED ORDER — PROPOFOL 500 MG/50ML IV EMUL
INTRAVENOUS | Status: DC | PRN
  Administered 2021-07-23: 150 mg via INTRAVENOUS

## 2021-07-23 MED ORDER — SODIUM CHLORIDE 0.9 % IR SOLN
Status: DC | PRN
Start: 2021-07-23 — End: 2021-07-23
  Administered 2021-07-23: 1000 mL
  Administered 2021-07-23: 3000 mL via INTRAVESICAL

## 2021-07-23 MED ORDER — FENTANYL CITRATE (PF) 100 MCG/2ML IJ SOLN
INTRAMUSCULAR | Status: AC
  Filled 2021-07-23: qty 2

## 2021-07-23 MED ORDER — CHLORHEXIDINE GLUCONATE 0.12 % MT SOLN
15.0000 mL | Freq: Once | OROMUCOSAL | Status: AC
  Administered 2021-07-23: 15 mL via OROMUCOSAL

## 2021-07-23 MED ORDER — LACTATED RINGERS IV SOLN: INTRAVENOUS | Status: DC

## 2021-07-23 MED ORDER — ONDANSETRON HCL 4 MG/2ML IJ SOLN
4.0000 mg | Freq: Once | INTRAMUSCULAR | Status: AC | PRN
  Administered 2021-07-23: 4 mg via INTRAVENOUS

## 2021-07-23 MED ORDER — DEXAMETHASONE SODIUM PHOSPHATE 10 MG/ML IJ SOLN
INTRAMUSCULAR | Status: AC
  Filled 2021-07-23: qty 1

## 2021-07-23 MED ORDER — STERILE WATER FOR IRRIGATION IR SOLN
Status: DC | PRN
  Administered 2021-07-23: 3000 mL

## 2021-07-23 MED ORDER — ONDANSETRON HCL 4 MG/2ML IJ SOLN
INTRAMUSCULAR | Status: AC
  Filled 2021-07-23: qty 2

## 2021-07-23 MED ORDER — LIDOCAINE 2% (20 MG/ML) 5 ML SYRINGE
INTRAMUSCULAR | Status: DC | PRN
  Administered 2021-07-23: 80 mg via INTRAVENOUS

## 2021-07-23 MED ORDER — OXYCODONE HCL 5 MG PO TABS: 5.0000 mg | ORAL_TABLET | Freq: Once | ORAL | Status: DC | PRN

## 2021-07-23 MED ORDER — CEFAZOLIN SODIUM-DEXTROSE 2-4 GM/100ML-% IV SOLN
2.0000 g | Freq: Once | INTRAVENOUS | Status: AC
  Administered 2021-07-23: 2 g via INTRAVENOUS
  Filled 2021-07-23: qty 100

## 2021-07-23 MED ORDER — FENTANYL CITRATE PF 50 MCG/ML IJ SOSY: 25.0000 ug | PREFILLED_SYRINGE | INTRAMUSCULAR | Status: DC | PRN

## 2021-07-23 SURGICAL SUPPLY — 23 items
BAG DRN RND TRDRP ANRFLXCHMBR (UROLOGICAL SUPPLIES)
BAG URINE DRAIN 2000ML AR STRL (UROLOGICAL SUPPLIES) IMPLANT
BAG URO CATCHER STRL LF (MISCELLANEOUS) ×4 IMPLANT
CATH FOLEY 2WAY SLVR  5CC 18FR (CATHETERS) ×3
CATH FOLEY 2WAY SLVR 5CC 18FR (CATHETERS) ×1 IMPLANT
CATH URETL OPEN 5X70 (CATHETERS) ×2 IMPLANT
DRAPE FOOT SWITCH (DRAPES) ×4 IMPLANT
ELECT REM PT RETURN 15FT ADLT (MISCELLANEOUS) ×4 IMPLANT
EVACUATOR MICROVAS BLADDER (UROLOGICAL SUPPLIES) IMPLANT
GLOVE SURG LX 7.5 STRW (GLOVE) ×2
GLOVE SURG LX STRL 7.5 STRW (GLOVE) ×4 IMPLANT
GOWN STRL REUS W/ TWL XL LVL3 (GOWN DISPOSABLE) ×4 IMPLANT
GOWN STRL REUS W/TWL XL LVL3 (GOWN DISPOSABLE) ×6
GUIDEWIRE STR DUAL SENSOR (WIRE) ×2 IMPLANT
KIT TURNOVER KIT A (KITS) ×2 IMPLANT
LOOP CUT BIPOLAR 24F LRG (ELECTROSURGICAL) IMPLANT
MANIFOLD NEPTUNE II (INSTRUMENTS) ×4 IMPLANT
PACK CYSTO (CUSTOM PROCEDURE TRAY) ×4 IMPLANT
PENCIL SMOKE EVACUATOR (MISCELLANEOUS) IMPLANT
SYR TOOMEY IRRIG 70ML (MISCELLANEOUS)
SYRINGE TOOMEY IRRIG 70ML (MISCELLANEOUS) IMPLANT
TUBING CONNECTING 10 (TUBING) ×4 IMPLANT
TUBING UROLOGY SET (TUBING) ×4 IMPLANT

## 2021-07-23 NOTE — Anesthesia Procedure Notes (Signed)
Procedure Name: LMA Insertion Date/Time: 07/23/2021 7:30 AM Performed by: Gerald Leitz, CRNA Pre-anesthesia Checklist: Patient identified, Patient being monitored, Timeout performed, Emergency Drugs available and Suction available Patient Re-evaluated:Patient Re-evaluated prior to induction Oxygen Delivery Method: Circle system utilized Preoxygenation: Pre-oxygenation with 100% oxygen Induction Type: IV induction Ventilation: Mask ventilation without difficulty LMA: LMA inserted LMA Size: 4.0 Tube type: Oral Number of attempts: 1 Placement Confirmation: positive ETCO2 and breath sounds checked- equal and bilateral Tube secured with: Tape Dental Injury: Teeth and Oropharynx as per pre-operative assessment

## 2021-07-23 NOTE — Anesthesia Postprocedure Evaluation (Signed)
Anesthesia Post Note  Patient: [name]  Procedure(s) Performed: CYSTOSCOPY WITH  BLADDER BIOPSY AND BILATERAL RETROGRADE PYELOGRAM (Urethra)     Patient location during evaluation: PACU Anesthesia Type: General Level of consciousness: awake Pain management: pain level controlled Vital Signs Assessment: post-procedure vital signs reviewed and stable Respiratory status: spontaneous breathing and respiratory function stable Cardiovascular status: stable Postop Assessment: no apparent nausea or vomiting Anesthetic complications: no   No notable events documented.  Last Vitals:  Vitals:   07/23/21 0830 07/23/21 0845  BP: (!) 156/89 (!) 157/71  Pulse: 65 70  Resp: 19 17  Temp:  36.5 C  SpO2: 100% 98%    Last Pain:  Vitals:   07/23/21 0845  TempSrc:   PainSc: 0-No pain                 Merlinda Frederick

## 2021-07-23 NOTE — Transfer of Care (Signed)
Immediate Anesthesia Transfer of Care Note  Patient: [name]  Procedure(s) Performed: Procedure(s): CYSTOSCOPY WITH  BLADDER BIOPSY AND BILATERAL RETROGRADE PYELOGRAM (N/A)  Patient Location: PACU  Anesthesia Type:General  Level of Consciousness: Alert, Awake, Oriented  Airway & Oxygen Therapy: Patient Spontanous Breathing  Post-op Assessment: Report given to RN  Post vital signs: Reviewed and stable  Last Vitals:  Vitals:   07/23/21 0557  BP: (!) 158/80  Pulse: 76  Resp: 18  Temp: 36.7 C  SpO2: 118%    Complications: No apparent anesthesia complications

## 2021-07-23 NOTE — Op Note (Signed)
Operative Note  Preoperative diagnosis:  1.  Low risk nonmuscle invasive bladder cancer:  2. Gross hematuria 3. Metastatic prostate cancer 4. BPH  Postoperative diagnosis: 1.  Low risk nonmuscle invasive bladder cancer:  2. Gross hematuria 3. Metastatic prostate cancer 4. BPH  Procedure(s): 1.  Cystoscopy 2. Bilateral retrograde pyelograms 3. Random bladder biopsies 4. Fulguration of prostate  Surgeon: Rexene Alberts, MD  Assistants:  None  Anesthesia:  General  Complications:  None  EBL:  Minimal  Specimens: 1.  ID Type Source Tests Collected by Time Destination  1 : Posterior bladder wall biopsy Tissue PATH Other SURGICAL PATHOLOGY Janith Lima, MD 07/23/2021 [phone]   2 : Right bladder wall biopsy Tissue PATH Other SURGICAL PATHOLOGY Janith Lima, MD 07/23/2021 [phone]   3 : Left bladder wall biopsy Tissue PATH Other SURGICAL PATHOLOGY Janith Lima, MD 07/23/2021 [phone]   A : Bladder washings Body Fluid PATH Cytology washing CYTOLOGY - NON PAP Janith Lima, MD 07/23/2021 0745    Drains/Catheters: 1.  18 Fr foley catheter  Intraoperative findings:   Obstructing trilobar prostate, friable with inflammatory edema. No suspicious bladder lesions. Bilateral retrograde pyelograms with no hydronephrosis, no filling defects.  The calyces were thin and delicate and contrast drained promptly. Successful random bladder biopsies with excellent hemostasis. Inflammatory bleeding in the prostate which was fulgurated with excellent hemostasis.  Indication:  [name] is a 88 y.o. male with history of metastatic breast cancer, low risk nonmuscle invasive bladder cancer, gross hematuria and BPH who presents today for gross hematuria and a positive urine cytology for renal bladder biopsies, bilateral retrograde pyelograms.  All the risks, benefits were discussed with the patient to include but not limited to infection, pain, bleeding, damage to adjacent structures, need for further  operations, adverse reaction to anesthesia and death.  Patient understands these risks and agrees to proceed with the operation as planned.    Description of procedure: After informed consent was obtained from the patient, the patient was taken to the operating room. General anesthesia was administered. The patient was placed in dorsal lithotomy position and prepped and draped in usual sterile fashion. Sequential compression devices were applied to lower extremities at the beginning of the case for DVT prophylaxis. Antibiotics were infused prior to surgery start time. A surgical time-out was performed to properly identify the patient, the surgery to be performed, and the surgical site.     We then passed the 21-French rigid cystoscope down the urethra and into the bladder under direct vision without any difficulty. The anterior urethral was normal. The prostate was obstructing with trilobar hyperplasia.  The prostate was friable and there was inflammatory edema present in the prostatic urethra. The bladder was inspected with 30 and 70 degree lenses. Once in the bladder, systematic evaluation of bladder revealed no suspicious bladder lesions. The ureteral orfices were in orthotopic position and not involved.  Bladder washing was sent for urine cytology.  I performed bilateral retrograde pyelograms as detailed above with no filling defects and no extravasation of contrast and no hydronephrosis.  I then performed random bladder biopsies in the posterior bladder wall, right lateral wall and left lateral wall.  Excellent hemostasis obtained using Bugbee fulguration.  I then withdrew the scope into the prostatic urethra.  There is some bleeding present.  I then used the Bugbee to fulgurate the prostate.  Excellent hemostasis was obtained.   I placed a 18 Foley catheter. The patient tolerated the procedure well with  no complication and was awoken from anesthesia and taken to recovery in stable condition.      Plan:  Discharge home.  Remove Foley catheter in 3 days on Monday morning.  [name]  Pager: [phone]

## 2021-07-23 NOTE — Discharge Instructions (Addendum)
Activity:  You are encouraged to ambulate frequently (about every hour during waking hours) to help prevent blood clots from forming in your legs or lungs.    Diet: You should advance your diet as instructed by your physician.  It will be normal to have some bloating, nausea, and abdominal discomfort intermittently.  Prescriptions:  You will be provided a prescription for pain medication to take as needed.  If your pain is not severe enough to require the prescription pain medication, you may take extra strength Tylenol instead which will have less side effects.  You should also take a prescribed stool softener to avoid straining with bowel movements as the prescription pain medication may constipate you.  What to call us about: You should call the office [phone]) if you develop fever > 101 or develop persistent vomiting. Activity:  You are encouraged to ambulate frequently (about every hour during waking hours) to help prevent blood clots from forming in your legs or lungs.    You have a foley catheter in place.  Remove Foley catheter on Monday morning with provided 10 cc syringe.

## 2021-07-24 ENCOUNTER — Encounter (HOSPITAL_COMMUNITY): Payer: Self-pay | Admitting: Urology

## 2021-07-27 ENCOUNTER — Other Ambulatory Visit: Payer: Medicare Other

## 2021-07-27 LAB — CYTOLOGY - NON PAP

## 2021-07-27 LAB — SURGICAL PATHOLOGY

## 2021-07-29 ENCOUNTER — Other Ambulatory Visit (HOSPITAL_COMMUNITY): Payer: Self-pay

## 2021-07-29 ENCOUNTER — Telehealth: Payer: Self-pay

## 2021-07-29 ENCOUNTER — Telehealth: Payer: Self-pay | Admitting: Pharmacy Technician

## 2021-07-29 ENCOUNTER — Inpatient Hospital Stay: Payer: Medicare Other | Attending: Oncology | Admitting: Oncology

## 2021-07-29 ENCOUNTER — Other Ambulatory Visit: Payer: Self-pay

## 2021-07-29 VITALS — BP 163/83 | HR 76 | Temp 98.2°F | Resp 19 | Ht 71.0 in | Wt 264.3 lb

## 2021-07-29 DIAGNOSIS — C61 Malignant neoplasm of prostate: Secondary | ICD-10-CM | POA: Diagnosis not present

## 2021-07-29 DIAGNOSIS — C772 Secondary and unspecified malignant neoplasm of intra-abdominal lymph nodes: Secondary | ICD-10-CM | POA: Diagnosis not present

## 2021-07-29 MED ORDER — ABIRATERONE ACETATE 250 MG PO TABS
1000.0000 mg | ORAL_TABLET | Freq: Every day | ORAL | 0 refills | Status: DC
  Filled 2021-07-29 (×2): qty 120, 30d supply, fill #0

## 2021-07-29 MED ORDER — PREDNISONE 5 MG PO TABS: 5.0000 mg | ORAL_TABLET | Freq: Every day | ORAL | 1 refills | Status: DC

## 2021-07-29 NOTE — Telephone Encounter (Signed)
Oral Oncology Patient Advocate Encounter  Prior Authorization for abiraterone acetate (ZYTIGA) has been approved.     Effective dates: 07/29/2021 through 07/30/2022  Patients co-pay is $559.63.    Lady Deutscher, CPhT-Adv Pharmacy Patient Advocate Specialist [place] Patient Advocate Team Direct Number: [phone]  Fax: [phone]

## 2021-07-29 NOTE — Telephone Encounter (Signed)
Oral Oncology Patient Advocate Encounter   Received notification that prior authorization for abiraterone acetate (ZYTIGA) is required.   PA submitted on 07/29/2021 [place] Status is pending     Lady Deutscher, CPhT-Adv Pharmacy Patient Advocate Specialist [place] Patient Advocate Team Direct Number: [phone]  Fax: [phone]

## 2021-07-29 NOTE — Progress Notes (Signed)
Reason for the request:    Prostate cancer  HPI: I was asked by Dr. Abner Greenspan to evaluate [name] for evaluation of prostate cancer.  Is an 88 year old man with history of abdominal aortic aneurysm, atrial fibrillation as well as noninvasive bladder tumor diagnosed in 2012.  He presented with hematuria at that time and has been on active surveillance.  He had a recurrent gross hematuria in 2023 and underwent imaging studies including CT scan abdomen pelvis on May 13, 2021.  A CT scan showed enlarged prostate and prominent retroperitoneal lymph nodes.  Based on these findings Dr. Abner Greenspan performed a cystoscopy which showed obstructing prostate with severe hyperplasia and urine cytology that was suspicious for malignancy.  CT-guided biopsy of the left pelvic lymph nodes on June 02, 2021 showed metastatic carcinoma staining positive for PSA and PSA serum at that time was 24.  Staging bone scan obtained on June 17, 2021 showed a single uptake in the right parietal bone with no corresponding abnormalities to CT head in 2015.  He was started on Firmagon under the care of Dr. Abner Greenspan and was referred to Dr. Tammi Klippel for possible radiation therapy.  PSMA PET scan obtained on Jul 07, 2021 showed more extensive disease than what detected conventional imaging including multifocal nodal and bony disease that include T1, sixth rib, T10 as well as anterior aortocaval, retrocrural among other lymphadenopathy.  He also completed cystoscopy and TURBT duration of the prostate in 4 on Jul 23, 2021 with the final pathology showed carcinoma in situ at the right and left wall.   Clinically, he reports no major complaints.  He does report some fatigue and some tiredness but no hematuria, dysuria or flank pain.  He denies any bone pain or pathological fractures.  Continues to live independently and attends to activities of daily living.    He does not report any headaches, blurry vision, syncope or seizures. Does not report any fevers,  chills or sweats.  Does not report any cough, wheezing or hemoptysis.  Does not report any chest pain, palpitation, orthopnea or leg edema.  Does not report any nausea, vomiting or abdominal pain.  Does not report any constipation or diarrhea.  Does not report any skeletal complaints.    Does not report frequency, urgency or hematuria.  Does not report any skin rashes or lesions. Does not report any heat or cold intolerance.  Does not report any lymphadenopathy or petechiae.  Does not report any anxiety or depression.  Remaining review of systems is negative.     Past Medical History:  Diagnosis Date   AAA (abdominal aortic aneurysm) ([place])    s/p stent graft repair 2012   Arthritis    "right ankle" (04/17/2014)   Ascending aorta dilatation ([place])    Echo 2021: 42 mm // Echocardiogram 10/22: EF 55-60, no RWMA, mildly reduced RVSF, RVSP 39.7, severe BAE, trivial MR, mod TR, trivial AI, mild AS (mean 8), ascending aorta 40 mm   Atrial fibrillation ([facility])    a. permanent;  b. Hematuria on Pradaxa;  c. cold intol and chills with Xarelto   Bladder cancer ([place])    Cardiomyopathy ([place])    a. Echo (03/2013): Inferior and inferolateral AK, EF 40-45%, mild MR, mod LAE, mod RAE, mod TR, mild PI, PASP 46   Chronic kidney disease    COPD (chronic obstructive pulmonary disease) (H[facility])    Coronary artery disease s/p cabg x7  1992   a. s/p CABG in 1992 (X 7 - Dr.  Wilson);  b.  Myoview (01/2011): Fixed inferior defect suggestive of prior MI versus diaphragmatic attenuation, apical reversibility possibly suspicious for small area of infarction with peri-infarct ischemia, no significant change from prior study, not gated, low risk study;  c.  Echo (05/2010): Mod LVH, EF 55-65%, mild AI, mild MR, moderate LAE, mild RAE, moderate    Hematuria bladder tumor ---  followed by Dr Karsten Ro   Hiatal hernia    repaired in 1992 w/[facility]   Hx of cardiovascular stress test    Lexiscan Myoview (03/2013):  No ischemia; not gated.    Hyperglycemia diet controlled   Hyperlipidemia    Hypertension    Hypothyroidism    Obesity    Organic impotence    RBBB    S/P AAA repair using bifurcation graft    Sleep apnea no rx cpap   mild- tested  2012   Ventricular ectopy    mild  :   Past Surgical History:  Procedure Laterality Date   ABDOMINAL AORTIC ANEURYSM REPAIR  02/28/2010   using bifurcation graft   CARDIAC CATHETERIZATION  03/31/1990   Positive   CARDIOVASCULAR STRESS TEST  02-01-2011  NUCLEAR NO EXERCISE   LOW RISK STUDY.  SMALL APICAL DEFECT. NO EVIDENCE OF MULTIZONE ISCHEMIA.   CORONARY ARTERY BYPASS GRAFT  05/30/1990   CABG X 7   CYSTOSCOPY WITH BIOPSY N/A 07/23/2021   Procedure: CYSTOSCOPY WITH  BLADDER BIOPSY AND BILATERAL RETROGRADE PYELOGRAM;  Surgeon: Janith Lima, MD;  Location: [facility];  Service: Urology;  Laterality: N/A;   ENDOVASCULAR STENT INSERTION  02/15/2011   Procedure: ENDOVASCULAR STENT GRAFT INSERTION;  Surgeon: Hinda Lenis, MD;  Location: [facility] [place] [facility];  Service: Vascular;  Laterality: N/A;  Insertion of endovascular stent graft    ETT  10/31/2000   WNL   HIATAL HERNIA REPAIR  02/28/1990   "repaired when I had my OHS"   Hospital - Afib  03/28/2002   INGUINAL HERNIA REPAIR Left 03/01/1995   TOTAL ANKLE ARTHROPLASTY Right 04/17/2014   Procedure: RIGHT TOTAL ANKLE ARTHOPLASTY;  Surgeon: Wylene Simmer, MD;  Location: [facility];  Service: Orthopedics;  Laterality: Right;   TRANSURETHRAL RESECTION OF BLADDER TUMOR  03/25/2011   Procedure: TRANSURETHRAL RESECTION OF BLADDER TUMOR (TURBT);  Surgeon: Claybon Jabs, MD;  Location[facility];  Service: Urology;  Laterality: N/A;  :   Current Outpatient Medications:    Aromatic Inhalants (VICKS VAPOINHALER IN), Inhale 1 puff into the lungs at bedtime as needed (congestion)., Disp: , Rfl:    atorvastatin (LIPITOR) 40 MG tablet, TAKE 1 TABLET BY MOUTH ONCE A DAY, Disp: 90 tablet, Rfl: 3   ELIQUIS 5 MG TABS tablet, TAKE 1 TABLET BY  MOUTH TWICE (2) DAILY, Disp: 60 tablet, Rfl: 5   finasteride (PROSCAR) 5 MG tablet, Take 5 mg by mouth daily., Disp: , Rfl:    levothyroxine (SYNTHROID) 137 MCG tablet, TAKE 1 TABLET BY MOUTH ONCE A DAY ON AN EMPTY STOMACH, Disp: 90 tablet, Rfl: 3   ramipril (ALTACE) 5 MG capsule, Take 1 capsule (5 mg total) by mouth 2 (two) times daily., Disp: 180 capsule, Rfl: 3   sildenafil (REVATIO) 20 MG tablet, TAKE 3 TO 5 TABLETS BY MOUTH DAILY AS NEEDED, Disp: 50 tablet, Rfl: 12   tamsulosin (FLOMAX) 0.4 MG CAPS capsule, Take 0.4 mg by mouth at bedtime., Disp: , Rfl: :   Allergies  Allergen Reactions   Dabigatran Etexilate Mesylate     Blood in urine  Xarelto [Rivaroxaban] Shortness Of Breath and Other (See Comments)    SOB, CHILLS   Metoprolol     Upset stomach  :   Family History  Problem Relation Age of Onset   Obesity Mother 56       deceased   Hypertension Mother    Diabetes Mother    Heart disease Mother        before age 1   Hyperlipidemia Mother    Alcohol abuse Sister 1       deceased   Other Father        deceased unknown   Colon cancer Neg Hx    Prostate cancer Neg Hx    Anesthesia problems Neg Hx   :   Social History   Socioeconomic History   Marital status: Widowed    Spouse name: Not on file   Number of children: Not on file   Years of education: Not on file   Highest education level: Not on file  Occupational History   Occupation: retired Pension scheme manager.    Employer: retired    Comment: 2001. Likes to play golf . Travels with motor home  Tobacco Use   Smoking status: Former    Packs/day: 1.25    Years: 30.00    Pack years: 37.50    Types: Cigarettes    Quit date: 02/28/1990    Years since quitting: 31.4   Smokeless tobacco: Never  Vaping Use   Vaping Use: Never used  Substance and Sexual Activity   Alcohol use: No    Alcohol/week: 0.0 standard drinks    Comment: basically no alcohol use.  only very rare alcohol use.    Drug use: No    Sexual activity: Yes  Other Topics Concern   Not on file  Social History Narrative   From [place]   Lives with girlfriend.     Divorced, remarried then widowed, 3 children (from 1st marriage)   Likes to golf   Prev 4 sport athlete in [place], football fan.    Social Determinants of Health   Financial Resource Strain: Low Risk    Difficulty of Paying Living Expenses: Not hard at all  Food Insecurity: No Food Insecurity   Worried About Charity fundraiser in the Last Year: Never true   [place] in the Last Year: Never true  Transportation Needs: No Transportation Needs   Lack of Transportation (Medical): No   Lack of Transportation (Non-Medical): No  Physical Activity: Insufficiently Active   Days of Exercise per Week: 4 days   Minutes of Exercise per Session: 30 min  Stress: No Stress Concern Present   Feeling of Stress : Not at all  Social Connections: Moderately Isolated   Frequency of Communication with Friends and Family: More than three times a week   Frequency of Social Gatherings with Friends and Family: Three times a week   Attends Religious Services: Never   Active Member of Clubs or Organizations: No   Attends Archivist Meetings: Never   Marital Status: Living with partner  Intimate Partner Violence: Not At Risk   Fear of Current or Ex-Partner: No   Emotionally Abused: No   Physically Abused: No   Sexually Abused: No  :  Pertinent items are noted in HPI.  Exam: Blood pressure (!) 163/83, pulse 76, temperature 98.2 F (36.8 C), temperature source Temporal, resp. rate 19, height '5\' 11"'$  (1.803 m), weight 264 lb 4.8 oz (119.9 kg), SpO2  99 %. ECOG 1 General appearance: alert and cooperative appeared without distress. Head: atraumatic without any abnormalities. Eyes: conjunctivae/corneas clear. PERRL.  Sclera anicteric. Throat: lips, mucosa, and tongue normal; without oral thrush or ulcers. Resp: clear to auscultation bilaterally without rhonchi,  wheezes or dullness to percussion. Cardio: regular rate and rhythm, S1, S2 normal, no murmur, click, rub or gallop GI: soft, non-tender; bowel sounds normal; no masses,  no organomegaly Skin: Skin color, texture, turgor normal. No rashes or lesions Lymph nodes: Cervical, supraclavicular, and axillary nodes normal. Neurologic: Grossly normal without any motor, sensory or deep tendon reflexes. Musculoskeletal: No joint deformity or effusion.  DG C-Arm 1-60 Min-No Report  Result Date: 07/23/2021 Fluoroscopy was utilized by the requesting physician.  No radiographic interpretation.   NM PET (PSMA) SKULL TO MID THIGH  Result Date: 07/09/2021 CLINICAL DATA:  Newly diagnosed high-risk prostate cancer in an 88 year old male with positive LEFT pelvic lymph node biopsy and prostate biopsy. EXAM: NUCLEAR MEDICINE PET SKULL BASE TO THIGH TECHNIQUE: 8.4 mCi F18 Piflufolastat (Pylarify) was injected intravenously. Full-ring PET imaging was performed from the skull base to thigh after the radiotracer. CT data was obtained and used for attenuation correction and anatomic localization. COMPARISON:  Previous CT imaging from June 18, 2021. FINDINGS: NECK Signs of supraclavicular and low neck adenopathy (image 67/4 and image 63/4. LEFT supraclavicular lymph node measuring 11 mm with a maximum SUV of 39.1. Smaller less tracer avid lymph nodes on image 63 measuring approximately 4-5 mm though clearly with increased radiotracer accumulation. No adenopathy by size criteria in the neck. Signs of calvarial metastatic disease, see below. Incidental CT finding: Sinus disease of RIGHT maxillary sinus is mild. CHEST Multifocal nodal metastases in the chest and signs of scattered small pulmonary nodules. LEFT hilar lymph node (image 94/4) not clearly visible as a discrete lymph node but with increased radiotracer accumulation showing maximum SUV 7.87. Juxta aortic lymph node (image 100/4) maximum SUV 16.3 lymph node measuring  approximately 5-6 mm. Scattered smaller lymph nodes with limited radiotracer accumulation (image 89/4) RIGHT paratracheal lymph node with mild radiotracer accumulation. Tiny pulmonary nodule RIGHT upper lobe (image 81/4) maximum SUV 3.48 measuring 3 mm. Tiny pulmonary nodule LEFT lower lobe (image 99/4) no substantial radiotracer accumulation. Branching and nodular changes in the RIGHT upper lobe seen on previous chest imaging from April 21st shows a maximum SUV of 2.5 (image 84/4) this area measuring in total 4 cm and approximally 1.5 cm greatest thickness. Incidental CT finding: Post median sternotomy. Cardiac enlargement is moderate to marked with four-chamber enlargement. Calcified atherosclerosis and signs of coronary artery disease post CABG and percutaneous coronary intervention. No adenopathy by size criteria in the chest. No effusion or consolidation. ABDOMEN/PELVIS Prostate: Focal activity in the prostate bed with maximum SUV of 28.56 extending into the seminal vesicles and extending throughout the posterior prostate from apex through the base and seminal vesicles. Lymph nodes: Multifocal nodal metastatic disease including retrocrural, retroperitoneal and pelvic metastases. Intra-aortocaval lymph node (image 151/4) maximum SUV of 35 measuring 11 mm short axis. RIGHT retrocrural lymph node with slightly less radiotracer accumulation. Lymph nodes tracking throughout the retroperitoneum most less than a cm all showing increased radiotracer accumulation greater than 20 for maximum SUV. LEFT pelvic lymph node along the external iliac chain as the largest of visible pelvic lymph nodes at 14 mm with a maximum SUV of 41.2. There are bilateral iliac lymph nodes with involvement and bilateral pelvic sidewall lymph nodes with involvement all smaller than the above  lymph node but displaying marked radiotracer accumulation. Similar size pelvic lymph node in the mesorectum (image 207/4) 14 mm maximum SUV 90.45. Liver:  Subtle area of hypodensity in the RIGHT hepatic lobe (image 141/4) no discrete increased radiotracer activity associated with this area. Incidental CT finding: Lobular hepatic contours. No pericholecystic stranding. No acute findings relative to pancreas, spleen, adrenal glands or of the kidneys. Renal cysts which require no dedicated follow-up are present in the LEFT kidney largest in the lateral upper pole measuring 3 cm. Urinary bladder is thickened in collapsed. No signs of acute gastrointestinal process. Diverticulosis of the colon. Small hiatal hernia. Aortic atherosclerosis post endovascular repair maximal caliber 4.7 cm is unchanged compared to March 16th. The bi iliac endograft placement as well with similar appearance. SKELETON Multifocal bony metastatic disease involving the calvarium LEFT sixth rib, multiple sites in the spine, sites in the RIGHT hemipelvis and LEFT sacral ala. Calvarial lesion (image 17/4) maximum SUV of 6.88. T1 lesion on the RIGHT (image 73/4) 10 mm maximum SUV 11.1. LEFT lateral and posterior sixth rib largest area (image 98/4) maximum SUV of 5.89 without discrete visible lesion. T10 vertebral body with subtle area of mixed sclerosis and lytic change (image 127/4) 14 mm with a maximum SUV of 21.3. Similar radiotracer accumulation in the LEFT L3 transverse process. Slightly less radiotracer accumulation in LEFT sacral ala at the superior margin of the sacral ala. Moderate uptake in the RIGHT posterior iliac. IMPRESSION: Signs of prostate cancer with extensive disease in the prostate, extending into seminal vesicles and associated with multifocal nodal and bony metastatic lesions. Nodal disease in the chest, abdomen and pelvis as described. No definitive signs of liver involvement. Subtle hypoattenuation in the RIGHT hemiliver with signs of liver disease warrants attention on subsequent imaging but shows no PSMA accumulation, potentially variable fat deposition as there was no clear  evidence of lesion in this location on previous imaging from May 13, 2021. Aortic atherosclerosis, cardiomegaly and signs of aorto bi-iliac endo grafting as discussed. RIGHT upper lobe nodular area potentially post infectious or inflammatory though without increased radiotracer accumulation. Differential would include infectious or inflammatory changes potentially atypical mycobacterial versus is bronchogenic neoplasm. Comparison with more remote imaging may be helpful. Short interval follow-up or FDG PET could be considered as warranted for further assessment. Electronically Signed   By: Zetta Bills M.D.   On: 07/09/2021 09:01    Assessment and Plan:    88 year old with:  1.  Advanced prostate cancer diagnosed in March 2023.  He presented with castration-sensitive disease including lymphadenopathy, bone involvement and a PSA of 24.  The natural course of this disease was reviewed at this time and treatment choices were reviewed.  Androgen deprivation remains the backbone to treat this disease but additional therapy escalation is recommended.  These options including androgen receptor pathway inhibitors such as enzalutamide or abiraterone, systemic chemotherapy among other options.  Risks and benefits of these agents were reviewed.  Complication associated with enzalutamide that include hypertension, fatigue among others were discussed.  After discussion today, we opted to proceed with abiraterone and prednisone.  Complications that include hypertension, edema, hypokalemia and hyperglycemia associated with prednisone were discussed.  He is agreeable to proceed at this time.  2.  Androgen deprivation: He is currently on Firmagon and will be continued under the care of Dr. Abner Greenspan  3.  Bone directed therapy: I recommended calcium and vitamin D supplements.  Consideration for Xgeva in the future.  4.  Bladder cancer: He  was found to have CIS after TURBT.  Management as per Dr. Abner Greenspan likely  intravesicular BCG treatment as first-line.  5.  Local therapy for his prostate cancer: I agree with Dr. Tammi Klippel deferring local therapy at this time unless he has symptomatic disease.  6.  Prognosis and goals of care: Therapy is palliative at this time with his disease is incurable.  Aggressive measures are warranted given the treatable nature of his disease.  7.  Follow-up: Will be in the next 4 to 6 weeks for repeat evaluation.  60  minutes were dedicated to this visit. The time was spent on reviewing laboratory data, imaging studies, discussing treatment options, and answering questions regarding future plan.      A copy of this consult has been forwarded to the requesting physician.

## 2021-08-02 ENCOUNTER — Other Ambulatory Visit (HOSPITAL_COMMUNITY): Payer: Self-pay

## 2021-08-02 DIAGNOSIS — R3912 Poor urinary stream: Secondary | ICD-10-CM | POA: Diagnosis not present

## 2021-08-02 DIAGNOSIS — C61 Malignant neoplasm of prostate: Secondary | ICD-10-CM | POA: Diagnosis not present

## 2021-08-02 DIAGNOSIS — C678 Malignant neoplasm of overlapping sites of bladder: Secondary | ICD-10-CM | POA: Diagnosis not present

## 2021-08-02 DIAGNOSIS — N401 Enlarged prostate with lower urinary tract symptoms: Secondary | ICD-10-CM | POA: Diagnosis not present

## 2021-08-02 DIAGNOSIS — C786 Secondary malignant neoplasm of retroperitoneum and peritoneum: Secondary | ICD-10-CM | POA: Diagnosis not present

## 2021-08-02 NOTE — Telephone Encounter (Signed)
Oral Oncology Pharmacist Encounter  Received new prescription for abiraterone (Zytiga) for the treatment of prostate cancer in conjunction with prednisone, planned duration until disease progression or unacceptable toxicity.  Labs from 07/06/21 assessed, no interventions needed. Prescription dose and frequency assessed.  Current medication list in Epic reviewed, DDIs with Zytiga identified: none  Evaluated chart and no patient barriers to medication adherence noted.   Patient agreement for treatment documented in MD note on 07/29/2021.  Prescription has been e-scribed to the [facility] for benefits analysis and approval.  Oral Oncology Clinic will continue to follow for insurance authorization, copayment issues, initial counseling and start date.  Drema Halon, PharmD Hematology/Oncology Clinical Pharmacist [facility] [phone] 08/02/2021 8:54 AM

## 2021-08-02 NOTE — Telephone Encounter (Addendum)
Oral Chemotherapy Pharmacist Encounter  I spoke with patient for overview of: Zytiga for the treatment of advanced, castration-sensitive prostate cancer in conjunction with prednisone, planned duration until disease progression or unacceptable toxicity.   Counseled patient on administration, dosing, side effects, monitoring, drug-food interactions, safe handling, storage, and disposal.  Patient will take Zytiga 250mg  tablets, 4 tablets (1000mg ) by mouth once daily on an empty stomach, 1 hour before or 2 hours after a meal.  Patient states he will take his Zytiga in the morning and will wait at least 1 hour before eating.  Patient will take prednisone 5mg  tablet, 1 tablet by mouth one daily with breakfast.  Zytiga start date: 08/03/21  Adverse effects include but are not limited to: peripheral edema, GI upset, hypertension, hot flashes, fatigue, and arthralgias.    Prednisone prescription has been sent to [facility] on 07/29/21. Patient will obtain prednisone and knows to start prednisone on the same day as Zytiga start.  Reviewed with patient importance of keeping a medication schedule and plan for any missed doses. No barriers to medication adherence identified.  Medication reconciliation performed and medication/allergy list updated.  Insurance authorization for Roosvelt Maser has been obtained. Test claim at the pharmacy revealed copayment $140 for 1st fill of 30 days. Patient picked up medication from [facility] on 08/02/2021.  Patient informed the pharmacy will reach out 5-7 days prior to needing next fill of Zytiga to coordinate continued medication acquisition to prevent break in therapy.  All questions answered.  Mr. Helman voiced understanding and appreciation.   Medication education handout placed in mail for patient. Patient knows to call the office with questions or concerns. Oral Chemotherapy Clinic phone number provided to patient.   Bethel Born,  PharmD Hematology/Oncology Clinical Pharmacist [facility] Oral Chemotherapy Navigation [facility] [phone] 08/02/2021 11:05 AM

## 2021-08-03 NOTE — Progress Notes (Signed)
RN left voicemail with patient to assess any navigation needs after seeing Dr. Alen Blew on 6/1 and starting Zytiga (6/6).

## 2021-08-04 ENCOUNTER — Other Ambulatory Visit (HOSPITAL_COMMUNITY): Payer: Self-pay

## 2021-08-12 ENCOUNTER — Other Ambulatory Visit (HOSPITAL_COMMUNITY): Payer: Self-pay

## 2021-08-24 ENCOUNTER — Other Ambulatory Visit (HOSPITAL_COMMUNITY): Payer: Self-pay

## 2021-08-24 ENCOUNTER — Telehealth: Payer: Self-pay | Admitting: Oncology

## 2021-08-24 ENCOUNTER — Other Ambulatory Visit: Payer: Self-pay | Admitting: Oncology

## 2021-08-24 MED ORDER — ABIRATERONE ACETATE 250 MG PO TABS
1000.0000 mg | ORAL_TABLET | Freq: Every day | ORAL | 0 refills | Status: DC
Start: 2021-08-24 — End: 2021-09-21
  Filled 2021-08-24: qty 120, 30d supply, fill #0

## 2021-08-24 NOTE — Telephone Encounter (Signed)
Called patient regarding upcoming August appointments, spoke with patient's daughter. Patient will be notified.

## 2021-08-25 ENCOUNTER — Telehealth: Payer: Self-pay | Admitting: Pharmacy Technician

## 2021-08-25 ENCOUNTER — Other Ambulatory Visit (HOSPITAL_COMMUNITY): Payer: Self-pay

## 2021-08-25 NOTE — Telephone Encounter (Signed)
Oral Oncology Patient Advocate Encounter   Was successful in securing patient a $3,500 grant from [employer] (PAF) to provide copayment coverage for Abiraterone (Zytiga).  This will keep the out of pocket expense at $0.     I have spoken with the patient.    The billing information is as follows and has been shared with Max.   RxBin: [id number] PCN:  [id number] Member ID: [id number] Group ID: [id number] Dates of Eligibility: 02/26/2021 through 08/25/2022   Lady Deutscher, CPhT-Adv Pharmacy Patient Advocate Specialist [facility] Patient Advocate Team Direct Number: [phone]  Fax: [phone]

## 2021-08-30 DIAGNOSIS — C678 Malignant neoplasm of overlapping sites of bladder: Secondary | ICD-10-CM | POA: Diagnosis not present

## 2021-08-30 DIAGNOSIS — Z5111 Encounter for antineoplastic chemotherapy: Secondary | ICD-10-CM | POA: Diagnosis not present

## 2021-09-06 DIAGNOSIS — C678 Malignant neoplasm of overlapping sites of bladder: Secondary | ICD-10-CM | POA: Diagnosis not present

## 2021-09-06 DIAGNOSIS — Z5111 Encounter for antineoplastic chemotherapy: Secondary | ICD-10-CM | POA: Diagnosis not present

## 2021-09-07 ENCOUNTER — Inpatient Hospital Stay: Payer: Medicare Other | Attending: Oncology

## 2021-09-07 ENCOUNTER — Inpatient Hospital Stay (HOSPITAL_BASED_OUTPATIENT_CLINIC_OR_DEPARTMENT_OTHER): Payer: Medicare Other | Admitting: Oncology

## 2021-09-07 ENCOUNTER — Other Ambulatory Visit: Payer: Self-pay

## 2021-09-07 VITALS — BP 168/93 | HR 81 | Temp 97.6°F | Resp 17 | Ht 71.0 in | Wt 264.6 lb

## 2021-09-07 DIAGNOSIS — C7951 Secondary malignant neoplasm of bone: Secondary | ICD-10-CM | POA: Insufficient documentation

## 2021-09-07 DIAGNOSIS — Z7952 Long term (current) use of systemic steroids: Secondary | ICD-10-CM | POA: Insufficient documentation

## 2021-09-07 DIAGNOSIS — Z7901 Long term (current) use of anticoagulants: Secondary | ICD-10-CM | POA: Insufficient documentation

## 2021-09-07 DIAGNOSIS — C61 Malignant neoplasm of prostate: Secondary | ICD-10-CM | POA: Insufficient documentation

## 2021-09-07 DIAGNOSIS — I10 Essential (primary) hypertension: Secondary | ICD-10-CM | POA: Diagnosis not present

## 2021-09-07 DIAGNOSIS — C679 Malignant neoplasm of bladder, unspecified: Secondary | ICD-10-CM | POA: Insufficient documentation

## 2021-09-07 DIAGNOSIS — C772 Secondary and unspecified malignant neoplasm of intra-abdominal lymph nodes: Secondary | ICD-10-CM

## 2021-09-07 DIAGNOSIS — Z79899 Other long term (current) drug therapy: Secondary | ICD-10-CM | POA: Diagnosis not present

## 2021-09-07 LAB — CBC WITH DIFFERENTIAL (CANCER CENTER ONLY)
Abs Immature Granulocytes: 0.04 10*3/uL (ref 0.00–0.07)
Basophils Absolute: 0.1 10*3/uL (ref 0.0–0.1)
Basophils Relative: 1 %
Eosinophils Absolute: 0 10*3/uL (ref 0.0–0.5)
Eosinophils Relative: 1 %
HCT: 42.7 % (ref 39.0–52.0)
Hemoglobin: 14 g/dL (ref 13.0–17.0)
Immature Granulocytes: 1 %
Lymphocytes Relative: 16 %
Lymphs Abs: 0.9 10*3/uL (ref 0.7–4.0)
MCH: 29.6 pg (ref 26.0–34.0)
MCHC: 32.8 g/dL (ref 30.0–36.0)
MCV: 90.3 fL (ref 80.0–100.0)
Monocytes Absolute: 0.4 10*3/uL (ref 0.1–1.0)
Monocytes Relative: 7 %
Neutro Abs: 4.5 10*3/uL (ref 1.7–7.7)
Neutrophils Relative %: 74 %
Platelet Count: 180 10*3/uL (ref 150–400)
RBC: 4.73 MIL/uL (ref 4.22–5.81)
RDW: 14.3 % (ref 11.5–15.5)
WBC Count: 5.9 10*3/uL (ref 4.0–10.5)
nRBC: 0 % (ref 0.0–0.2)

## 2021-09-07 LAB — CMP (CANCER CENTER ONLY)
ALT: 11 U/L (ref 0–44)
AST: 13 U/L — ABNORMAL LOW (ref 15–41)
Albumin: 4 g/dL (ref 3.5–5.0)
Alkaline Phosphatase: 94 U/L (ref 38–126)
Anion gap: 6 (ref 5–15)
BUN: 17 mg/dL (ref 8–23)
CO2: 28 mmol/L (ref 22–32)
Calcium: 9.5 mg/dL (ref 8.9–10.3)
Chloride: 106 mmol/L (ref 98–111)
Creatinine: 1.11 mg/dL (ref 0.61–1.24)
GFR, Estimated: >60 mL/min (ref >60)
Glucose, Bld: 163 mg/dL — ABNORMAL HIGH (ref 70–99)
Potassium: 4.2 mmol/L (ref 3.5–5.1)
Sodium: 140 mmol/L (ref 135–145)
Total Bilirubin: 0.7 mg/dL (ref 0.3–1.2)
Total Protein: 7.2 g/dL (ref 6.5–8.1)

## 2021-09-07 NOTE — Progress Notes (Signed)
Hematology and Oncology Follow Up Visit  [name] [id number] 1933-09-25 88 y.o. 09/07/2021 2:53 PM Tonia Ghent, MDDuncan, Johnny Rising, MD   Principle Diagnosis: 88 year old with prostate cancer diagnosed in May 2023.  He was found to have castration-sensitive advanced disease with lymphadenopathy and bone involvement.  His PSA was 24 at the time of diagnosis.  Secondary diagnosis: Superficial bladder tumor diagnosed in 2012.  He developed relapsed disease in May of 2023 with CIS.   Prior Therapy: CT-guided biopsy of the left pelvic lymph nodes on June 02, 2021 showed metastatic carcinoma staining positive for PSA.  Current therapy:  Androgen deprivation therapy under the care of Dr. Abner Greenspan started on June 23, 2021 with Mills Koller.  He received 45 mg of Eligard in June 2023.  Zytiga 1000 mg with prednisone 5 mg started on August 03, 2021.  Intravesicular BCG under the care of Dr. Abner Greenspan currently ongoing.  Interim History: [name] returns today for follow-up.  Since the last visit, he reports feeling well without any major complaints.  He has tolerated Zytiga reasonably well without any recent concerns.  He denies any nausea, vomiting or abdominal pain.  He denies any diarrhea or edema.  He did report dizziness today but none previously.  He is currently undergoing BCG treatment without any issues.  Medications: I have reviewed the patient's current medications.  Current Outpatient Medications  Medication Sig Dispense Refill   abiraterone acetate (ZYTIGA) 250 MG tablet Take 4 tablets (1,000 mg total) by mouth daily. Take on an empty stomach 1 hour before or 2 hours after a meal 120 tablet 0   Aromatic Inhalants (VICKS VAPOINHALER IN) Inhale 1 puff into the lungs at bedtime as needed (congestion).     atorvastatin (LIPITOR) 40 MG tablet TAKE 1 TABLET BY MOUTH ONCE A DAY 90 tablet 3   ELIQUIS 5 MG TABS tablet TAKE 1 TABLET BY MOUTH TWICE (2) DAILY 60 tablet 5   finasteride (PROSCAR) 5 MG  tablet Take 5 mg by mouth daily.     levothyroxine (SYNTHROID) 137 MCG tablet TAKE 1 TABLET BY MOUTH ONCE A DAY ON AN EMPTY STOMACH 90 tablet 3   predniSONE (DELTASONE) 5 MG tablet Take 1 tablet (5 mg total) by mouth daily with breakfast. 90 tablet 1   ramipril (ALTACE) 5 MG capsule Take 1 capsule (5 mg total) by mouth 2 (two) times daily. 180 capsule 3   sildenafil (REVATIO) 20 MG tablet TAKE 3 TO 5 TABLETS BY MOUTH DAILY AS NEEDED 50 tablet 12   tamsulosin (FLOMAX) 0.4 MG CAPS capsule Take 0.4 mg by mouth at bedtime.     No current facility-administered medications for this visit.     Allergies:  Allergies  Allergen Reactions   Dabigatran Etexilate Mesylate     Blood in urine   Xarelto [Rivaroxaban] Shortness Of Breath and Other (See Comments)    SOB, CHILLS   Metoprolol     Upset stomach      Physical Exam: Blood pressure (!) 168/93, pulse 81, temperature 97.6 F (36.4 C), temperature source Temporal, resp. rate 17, height '5\' 11"'$  (1.803 m), weight 264 lb 9.6 oz (120 kg), SpO2 96 %.  ECOG: 1   General appearance: Comfortable appearing without any discomfort Head: Normocephalic without any trauma Oropharynx: Mucous membranes are moist and pink without any thrush or ulcers. Eyes: Pupils are equal and round reactive to light. Lymph nodes: No cervical, supraclavicular, inguinal or axillary lymphadenopathy.   Heart:regular rate and rhythm.  S1 and S2 without leg edema. Lung: Clear without any rhonchi or wheezes.  No dullness to percussion. Abdomin: Soft, nontender, nondistended with good bowel sounds.  No hepatosplenomegaly. Musculoskeletal: No joint deformity or effusion.  Full range of motion noted. Neurological: No deficits noted on motor, sensory and deep tendon reflex exam. Skin: No petechial rash or dryness.  Appeared moist.     Lab Results: Lab Results  Component Value Date   WBC 6.0 07/06/2021   HGB 14.9 07/06/2021   HCT 46.8 07/06/2021   MCV 91.8 07/06/2021    PLT 224 07/06/2021   PSA 0.61 06/05/2015     Chemistry      Component Value Date/Time   NA 139 07/06/2021 1012   NA 141 09/24/2019 0850   K 4.2 07/06/2021 1012   CL 104 07/06/2021 1012   CO2 25 07/06/2021 1012   BUN 17 07/06/2021 1012   BUN 12 09/24/2019 0850   CREATININE 0.93 07/06/2021 1012   CREATININE 0.90 05/03/2013 1032      Component Value Date/Time   CALCIUM 9.3 07/06/2021 1012   ALKPHOS 97 06/17/2021 0802   AST 14 06/17/2021 0802   ALT 14 06/17/2021 0802   BILITOT 0.9 06/17/2021 0802   BILITOT 0.9 09/24/2019 0850          Impression and Plan:   88 year old with:  1.  Castration-sensitive advanced prostate cancer with lymphadenopathy and bone disease diagnosed in March 2023.  He presented with  PSA of 24.   He is currently on Zytiga and completed the first month of therapy without any complications.  His PSA already responded to androgen depravation down to 1.37 on June 1.  Complication associated with long-term treatment including hypertension, adrenal sufficiency among others were reviewed.  He is agreeable to continue.   2.  Androgen deprivation: He has received Eligard in June 2023 and will be repeated in December.    3.  Bone directed therapy: Delton See has been deferred due to dental clearance issues.  I recommended calcium and vitamin D supplements.     4.  Bladder cancer: He is currently receiving BCG treatment for recurrent CIS.  5.  Hypertension: We will continue to monitor on Zytiga and I recommended ambulatory blood pressure monitoring.   6.  Prognosis and goals of care: His disease is incurable although aggressive measures are warranted given his reasonable fall status.  7.  Follow-up: In 8 weeks for repeat follow-up.   30  minutes were spent on this encounter.  The time was dedicated to reviewing laboratory data, disease status update and outlining future plan of care discussion.       Zola Button, MD 7/11/20232:53 PM

## 2021-09-08 LAB — PROSTATE-SPECIFIC AG, SERUM (LABCORP): Prostate Specific Ag, Serum: 0.4 ng/mL (ref 0.0–4.0)

## 2021-09-13 DIAGNOSIS — C678 Malignant neoplasm of overlapping sites of bladder: Secondary | ICD-10-CM | POA: Diagnosis not present

## 2021-09-13 DIAGNOSIS — Z5111 Encounter for antineoplastic chemotherapy: Secondary | ICD-10-CM | POA: Diagnosis not present

## 2021-09-20 ENCOUNTER — Other Ambulatory Visit: Payer: Self-pay | Admitting: Oncology

## 2021-09-20 ENCOUNTER — Other Ambulatory Visit (HOSPITAL_COMMUNITY): Payer: Self-pay

## 2021-09-20 DIAGNOSIS — C678 Malignant neoplasm of overlapping sites of bladder: Secondary | ICD-10-CM | POA: Diagnosis not present

## 2021-09-20 DIAGNOSIS — R8271 Bacteriuria: Secondary | ICD-10-CM | POA: Diagnosis not present

## 2021-09-21 ENCOUNTER — Other Ambulatory Visit (HOSPITAL_COMMUNITY): Payer: Self-pay

## 2021-09-21 ENCOUNTER — Other Ambulatory Visit: Payer: Self-pay | Admitting: *Deleted

## 2021-09-21 DIAGNOSIS — C772 Secondary and unspecified malignant neoplasm of intra-abdominal lymph nodes: Secondary | ICD-10-CM

## 2021-09-21 MED ORDER — ABIRATERONE ACETATE 250 MG PO TABS
1000.0000 mg | ORAL_TABLET | Freq: Every day | ORAL | 0 refills | Status: DC
  Filled 2021-09-21: qty 120, 30d supply, fill #0

## 2021-09-27 ENCOUNTER — Other Ambulatory Visit (HOSPITAL_COMMUNITY): Payer: Self-pay

## 2021-09-30 ENCOUNTER — Other Ambulatory Visit (HOSPITAL_COMMUNITY): Payer: Self-pay

## 2021-10-04 DIAGNOSIS — Z5111 Encounter for antineoplastic chemotherapy: Secondary | ICD-10-CM | POA: Diagnosis not present

## 2021-10-04 DIAGNOSIS — C678 Malignant neoplasm of overlapping sites of bladder: Secondary | ICD-10-CM | POA: Diagnosis not present

## 2021-10-12 DIAGNOSIS — C678 Malignant neoplasm of overlapping sites of bladder: Secondary | ICD-10-CM | POA: Diagnosis not present

## 2021-10-12 DIAGNOSIS — Z5111 Encounter for antineoplastic chemotherapy: Secondary | ICD-10-CM | POA: Diagnosis not present

## 2021-10-19 ENCOUNTER — Other Ambulatory Visit: Payer: Self-pay | Admitting: Oncology

## 2021-10-19 ENCOUNTER — Other Ambulatory Visit (HOSPITAL_COMMUNITY): Payer: Self-pay

## 2021-10-19 DIAGNOSIS — C678 Malignant neoplasm of overlapping sites of bladder: Secondary | ICD-10-CM | POA: Diagnosis not present

## 2021-10-19 DIAGNOSIS — C61 Malignant neoplasm of prostate: Secondary | ICD-10-CM

## 2021-10-19 DIAGNOSIS — Z5111 Encounter for antineoplastic chemotherapy: Secondary | ICD-10-CM | POA: Diagnosis not present

## 2021-10-19 MED ORDER — ABIRATERONE ACETATE 250 MG PO TABS
1000.0000 mg | ORAL_TABLET | Freq: Every day | ORAL | 0 refills | Status: DC
  Filled 2021-10-19: qty 120, 30d supply, fill #0

## 2021-10-25 DIAGNOSIS — C61 Malignant neoplasm of prostate: Secondary | ICD-10-CM | POA: Diagnosis not present

## 2021-10-28 ENCOUNTER — Other Ambulatory Visit (HOSPITAL_COMMUNITY): Payer: Self-pay

## 2021-11-08 DIAGNOSIS — N410 Acute prostatitis: Secondary | ICD-10-CM | POA: Diagnosis not present

## 2021-11-08 DIAGNOSIS — Z87891 Personal history of nicotine dependence: Secondary | ICD-10-CM | POA: Diagnosis not present

## 2021-11-08 DIAGNOSIS — Z79899 Other long term (current) drug therapy: Secondary | ICD-10-CM | POA: Diagnosis not present

## 2021-11-08 DIAGNOSIS — Z8551 Personal history of malignant neoplasm of bladder: Secondary | ICD-10-CM | POA: Diagnosis not present

## 2021-11-08 DIAGNOSIS — Z8546 Personal history of malignant neoplasm of prostate: Secondary | ICD-10-CM | POA: Diagnosis not present

## 2021-11-08 DIAGNOSIS — Z79890 Hormone replacement therapy (postmenopausal): Secondary | ICD-10-CM | POA: Diagnosis not present

## 2021-11-08 DIAGNOSIS — R339 Retention of urine, unspecified: Secondary | ICD-10-CM | POA: Diagnosis not present

## 2021-11-12 ENCOUNTER — Inpatient Hospital Stay (HOSPITAL_BASED_OUTPATIENT_CLINIC_OR_DEPARTMENT_OTHER): Payer: Medicare Other | Admitting: Oncology

## 2021-11-12 ENCOUNTER — Inpatient Hospital Stay: Payer: Medicare Other | Attending: Oncology

## 2021-11-12 ENCOUNTER — Other Ambulatory Visit: Payer: Self-pay

## 2021-11-12 VITALS — BP 185/96 | HR 85 | Temp 97.8°F | Resp 18 | Ht 71.0 in | Wt 266.9 lb

## 2021-11-12 DIAGNOSIS — Z7952 Long term (current) use of systemic steroids: Secondary | ICD-10-CM | POA: Insufficient documentation

## 2021-11-12 DIAGNOSIS — I10 Essential (primary) hypertension: Secondary | ICD-10-CM | POA: Insufficient documentation

## 2021-11-12 DIAGNOSIS — C7951 Secondary malignant neoplasm of bone: Secondary | ICD-10-CM | POA: Diagnosis not present

## 2021-11-12 DIAGNOSIS — Z79899 Other long term (current) drug therapy: Secondary | ICD-10-CM | POA: Insufficient documentation

## 2021-11-12 DIAGNOSIS — C679 Malignant neoplasm of bladder, unspecified: Secondary | ICD-10-CM | POA: Insufficient documentation

## 2021-11-12 DIAGNOSIS — R339 Retention of urine, unspecified: Secondary | ICD-10-CM | POA: Insufficient documentation

## 2021-11-12 DIAGNOSIS — C772 Secondary and unspecified malignant neoplasm of intra-abdominal lymph nodes: Secondary | ICD-10-CM

## 2021-11-12 DIAGNOSIS — Z7901 Long term (current) use of anticoagulants: Secondary | ICD-10-CM | POA: Insufficient documentation

## 2021-11-12 DIAGNOSIS — C61 Malignant neoplasm of prostate: Secondary | ICD-10-CM

## 2021-11-12 LAB — CMP (CANCER CENTER ONLY)
ALT: 13 U/L (ref 0–44)
AST: 18 U/L (ref 15–41)
Albumin: 3.5 g/dL (ref 3.5–5.0)
Alkaline Phosphatase: 77 U/L (ref 38–126)
Anion gap: 6 (ref 5–15)
BUN: 15 mg/dL (ref 8–23)
CO2: 25 mmol/L (ref 22–32)
Calcium: 9 mg/dL (ref 8.9–10.3)
Chloride: 110 mmol/L (ref 98–111)
Creatinine: 1.03 mg/dL (ref 0.61–1.24)
GFR, Estimated: >60 mL/min (ref >60)
Glucose, Bld: 138 mg/dL — ABNORMAL HIGH (ref 70–99)
Potassium: 3.9 mmol/L (ref 3.5–5.1)
Sodium: 141 mmol/L (ref 135–145)
Total Bilirubin: 0.5 mg/dL (ref 0.3–1.2)
Total Protein: 6.8 g/dL (ref 6.5–8.1)

## 2021-11-12 LAB — CBC WITH DIFFERENTIAL (CANCER CENTER ONLY)
Abs Immature Granulocytes: 0.04 10*3/uL (ref 0.00–0.07)
Basophils Absolute: 0 10*3/uL (ref 0.0–0.1)
Basophils Relative: 1 %
Eosinophils Absolute: 0.2 10*3/uL (ref 0.0–0.5)
Eosinophils Relative: 2 %
HCT: 39.1 % (ref 39.0–52.0)
Hemoglobin: 13.1 g/dL (ref 13.0–17.0)
Immature Granulocytes: 1 %
Lymphocytes Relative: 12 %
Lymphs Abs: 0.9 10*3/uL (ref 0.7–4.0)
MCH: 30.8 pg (ref 26.0–34.0)
MCHC: 33.5 g/dL (ref 30.0–36.0)
MCV: 91.8 fL (ref 80.0–100.0)
Monocytes Absolute: 0.5 10*3/uL (ref 0.1–1.0)
Monocytes Relative: 8 %
Neutro Abs: 5.5 10*3/uL (ref 1.7–7.7)
Neutrophils Relative %: 76 %
Platelet Count: 181 10*3/uL (ref 150–400)
RBC: 4.26 MIL/uL (ref 4.22–5.81)
RDW: 13.9 % (ref 11.5–15.5)
WBC Count: 7.1 10*3/uL (ref 4.0–10.5)
nRBC: 0 % (ref 0.0–0.2)

## 2021-11-12 NOTE — Progress Notes (Signed)
Hematology and Oncology Follow Up Visit  [name] [id number] [birth year] 88 y.o. 11/12/2021 3:14 PM [name], MDDuncan, Johnny Rising, MD   Principle Diagnosis: 88 year old with castration-sensitive advanced prostate cancer with lymphadenopathy and bone disease diagnosed in May 2023.  He was found to have PSA was 24 at the time of diagnosis.  Secondary diagnosis: Superficial bladder tumor diagnosed in 2012.  He developed relapsed disease in May of 2023 with CIS.   Prior Therapy: CT-guided biopsy of the left pelvic lymph nodes on June 02, 2021 showed metastatic carcinoma staining positive for PSA.  Current therapy:  Eligard 45 mg every 6 months started in June 2023.  He is currently receiving that under the care of [facility].  Zytiga 1000 mg with prednisone 5 mg started on August 03, 2021.    Interim History: [name] returns today for a follow-up.  Since last visit, he reports a few complaints.  He has reported symptoms of fatigue and tiredness lack of energy since the start of Zytiga.  He also developed urinary retention while traveling to [place].  He has required a Foley catheter and has follow-up with Dr. Abner Galvan in the near future.  He denies any hematuria or dysuria.  Denies any fevers or chills.  Medications: Updated on review. Current Outpatient Medications  Medication Sig Dispense Refill   abiraterone acetate (ZYTIGA) 250 MG tablet Take 4 tablets (1,000 mg total) by mouth daily. Take on an empty stomach 1 hour before or 2 hours after a meal 120 tablet 0   Aromatic Inhalants (VICKS VAPOINHALER IN) Inhale 1 puff into the lungs at bedtime as needed (congestion).     atorvastatin (LIPITOR) 40 MG tablet TAKE 1 TABLET BY MOUTH ONCE A DAY 90 tablet 3   ELIQUIS 5 MG TABS tablet TAKE 1 TABLET BY MOUTH TWICE (2) DAILY 60 tablet 5   finasteride (PROSCAR) 5 MG tablet Take 5 mg by mouth daily.     levothyroxine (SYNTHROID) 137 MCG tablet TAKE 1 TABLET BY MOUTH ONCE A DAY ON AN  EMPTY STOMACH 90 tablet 3   predniSONE (DELTASONE) 5 MG tablet Take 1 tablet (5 mg total) by mouth daily with breakfast. 90 tablet 1   ramipril (ALTACE) 5 MG capsule Take 1 capsule (5 mg total) by mouth 2 (two) times daily. 180 capsule 3   sildenafil (REVATIO) 20 MG tablet TAKE 3 TO 5 TABLETS BY MOUTH DAILY AS NEEDED 50 tablet 12   tamsulosin (FLOMAX) 0.4 MG CAPS capsule Take 0.4 mg by mouth at bedtime.     No current facility-administered medications for this visit.     Allergies:  Allergies  Allergen Reactions   Dabigatran Etexilate Mesylate     Blood in urine   Xarelto [Rivaroxaban] Shortness Of Breath and Other (See Comments)    SOB, CHILLS   Metoprolol     Upset stomach      Physical Exam: Blood pressure (!) 185/96, pulse 85, temperature 97.8 F (36.6 C), temperature source Temporal, resp. rate 18, height '5\' 11"'$  (1.803 m), weight 266 lb 14.4 oz (121.1 kg), SpO2 95 %.   ECOG: 1   General appearance: Alert, awake without any distress. Head: Atraumatic without abnormalities Oropharynx: Without any thrush or ulcers. Eyes: No scleral icterus. Lymph nodes: No lymphadenopathy noted in the cervical, supraclavicular, or axillary nodes Heart:regular rate and rhythm, without any murmurs or gallops.   Lung: Clear to auscultation without any rhonchi, wheezes or dullness to percussion. Abdomin: Soft, nontender without any  shifting dullness or ascites. Musculoskeletal: No clubbing or cyanosis. Neurological: No motor or sensory deficits. Skin: No rashes or lesions.     Lab Results: Lab Results  Component Value Date   WBC 5.9 09/07/2021   HGB 14.0 09/07/2021   HCT 42.7 09/07/2021   MCV 90.3 09/07/2021   PLT 180 09/07/2021   PSA 0.61 06/05/2015     Chemistry      Component Value Date/Time   NA 140 09/07/2021 1442   NA 141 09/24/2019 0850   K 4.2 09/07/2021 1442   CL 106 09/07/2021 1442   CO2 28 09/07/2021 1442   BUN 17 09/07/2021 1442   BUN 12 09/24/2019 0850    CREATININE 1.11 09/07/2021 1442   CREATININE 0.90 05/03/2013 1032      Component Value Date/Time   CALCIUM 9.5 09/07/2021 1442   ALKPHOS 94 09/07/2021 1442   AST 13 (L) 09/07/2021 1442   ALT 11 09/07/2021 1442   BILITOT 0.7 09/07/2021 1442        Latest Reference Range & Units 06/05/15 08:14 09/07/21 14:42  PSA 0.10 - 4.00 ng/ml 0.61   Prostate Specific Ag, Serum 0.0 - 4.0 ng/mL  0.4     Impression and Plan:   88 year old with:  1.  Advanced prostate cancer diagnosed in March 2023.  He has castration-sensitive disease at this time.  The natural course of this disease was reviewed at this time and treatment options were discussed.  He continues to be on Zytiga which she has tolerated very well.  His PSA showed excellent response currently at 0.4.  After discussion today, we opted to continue with Zytiga but will reduce the dose to 500 mg daily given his symptoms.  His predominant complaints are lack of energy and excessive fatigue.  If the symptoms persist he prefers not to be on this medication.   2.  Androgen deprivation: He is currently on Eligard which will be repeated in December 2023 under the care of Dr. Abner Galvan.   3.  Bone directed therapy: Recommended calcium and vitamin D supplements and consideration for Xgeva after obtaining dental clearance.   4.  Bladder cancer: He had completed BCG treatment under the care of Dr. Abner Galvan for CIS.  5.  Hypertension: His blood pressure is elevated and will reduce the Zytiga dose accordingly.   6.  Prognosis and goals of care: Therapy remains palliative at this time although this measures are warranted.  7.  Urinary retention: Likely related to his recent travel and possible BPH.  8.  Follow-up: In 2 months for a follow-up.   30  minutes were spent on this visit.  The time was dedicated to reviewing disease status, treatment choices and outlining future plan of care review.       Zola Button, MD 9/15/20233:14 PM

## 2021-11-13 LAB — PROSTATE-SPECIFIC AG, SERUM (LABCORP): Prostate Specific Ag, Serum: 0.2 ng/mL (ref 0.0–4.0)

## 2021-11-15 ENCOUNTER — Telehealth: Payer: Self-pay | Admitting: Oncology

## 2021-11-15 ENCOUNTER — Telehealth: Payer: Self-pay | Admitting: *Deleted

## 2021-11-15 NOTE — Telephone Encounter (Signed)
Notified of message below

## 2021-11-15 NOTE — Telephone Encounter (Signed)
-----   Message from Johnny Portela, MD sent at 11/15/2021  8:21 AM EDT ----- Please let him know his PSA is still low

## 2021-11-15 NOTE — Telephone Encounter (Signed)
Per 9/15 los called and left message for pt about appointment

## 2021-11-18 ENCOUNTER — Other Ambulatory Visit (HOSPITAL_COMMUNITY): Payer: Self-pay

## 2021-11-18 DIAGNOSIS — C61 Malignant neoplasm of prostate: Secondary | ICD-10-CM | POA: Diagnosis not present

## 2021-11-18 DIAGNOSIS — R338 Other retention of urine: Secondary | ICD-10-CM | POA: Diagnosis not present

## 2021-11-18 DIAGNOSIS — C678 Malignant neoplasm of overlapping sites of bladder: Secondary | ICD-10-CM | POA: Diagnosis not present

## 2021-11-18 DIAGNOSIS — N401 Enlarged prostate with lower urinary tract symptoms: Secondary | ICD-10-CM | POA: Diagnosis not present

## 2021-11-19 ENCOUNTER — Other Ambulatory Visit (HOSPITAL_COMMUNITY): Payer: Self-pay

## 2021-12-04 ENCOUNTER — Other Ambulatory Visit: Payer: Self-pay | Admitting: Cardiovascular Disease

## 2021-12-06 ENCOUNTER — Other Ambulatory Visit: Payer: Self-pay | Admitting: Urology

## 2021-12-06 ENCOUNTER — Telehealth: Payer: Self-pay | Admitting: Cardiovascular Disease

## 2021-12-06 NOTE — Telephone Encounter (Signed)
Prescription refill request for Eliquis received. Indication: AF Last office visit: 06/24/21  Elvin So NP Scr: 1.03 on 11/12/21 Age: 88 Weight: 120.2kg  Based on above findings Eliquis '5mg'$  twice daily is the appropriate dose.  Refill approved.

## 2021-12-06 NOTE — Telephone Encounter (Signed)
     Pre-operative Risk Assessment    Patient Name: [name]  DOB: 01/04/1934 MRN: [id number]      Request for Surgical Clearance    Procedure:   cystoscopy bladder biopsy possible transurethral resection of bladder tumor and bilateral retrograde pyelogram   Date of Surgery:  Clearance 01/07/22                                 Surgeon:  Dr. Loletha Carrow  Surgeon's Group or Practice Name:  [facility] Phone number:  [phone] Fax number:  [phone]   Type of Clearance Requested:   - Medical  - Pharmacy:  Hold Apixaban (Eliquis) 2 days prior surgery    Type of Anesthesia:  General  but intubated paralyze    Additional requests/questions:    Signed, Selinda Orion   12/06/2021, 4:23 PM

## 2021-12-07 ENCOUNTER — Other Ambulatory Visit (HOSPITAL_COMMUNITY): Payer: Self-pay

## 2021-12-07 ENCOUNTER — Other Ambulatory Visit: Payer: Self-pay | Admitting: Oncology

## 2021-12-07 ENCOUNTER — Other Ambulatory Visit: Payer: Self-pay | Admitting: *Deleted

## 2021-12-07 ENCOUNTER — Telehealth: Payer: Self-pay | Admitting: *Deleted

## 2021-12-07 DIAGNOSIS — C61 Malignant neoplasm of prostate: Secondary | ICD-10-CM

## 2021-12-07 MED ORDER — ABIRATERONE ACETATE 250 MG PO TABS
500.0000 mg | ORAL_TABLET | Freq: Every day | ORAL | 0 refills | Status: DC
  Filled 2021-12-07: qty 60, 30d supply, fill #0

## 2021-12-07 MED ORDER — ABIRATERONE ACETATE 250 MG PO TABS
1000.0000 mg | ORAL_TABLET | Freq: Every day | ORAL | 0 refills | Status: DC
  Filled 2021-12-07: qty 120, 30d supply, fill #0

## 2021-12-07 NOTE — Telephone Encounter (Signed)
[place], MD   Preoperative team, please contact this patient and set up a phone call appointment for further preoperative risk assessment. Please obtain consent and complete medication review. Thank you for your help.   I confirm that guidance regarding antiplatelet and oral anticoagulation therapy has been completed and, if necessary, noted below.   Emmaline Life, NP-C  12/07/2021, 8:23 AM [address] Office [phone] Fax [phone]

## 2021-12-07 NOTE — Telephone Encounter (Signed)
Patient with diagnosis of A Fib on Eliquis  for anticoagulation.     Procedure: TKA  Date of procedure: 12/13/21?     CHA2DS2-VASc Score = 5  This indicates a 7.2% annual risk of stroke. The patient's score is based upon: CHF History: 1 HTN History: 1 Diabetes History: 0 Stroke History: 0 Vascular Disease History: 1 Age Score: 2 Gender Score: 0     CrCl 29 mL/min Platelet count 181K     Per office protocol, patient can hold Eliquis for 3 days prior to procedure.     **This guidance is not considered finalized until pre-operative APP has relayed final recommendations

## 2021-12-07 NOTE — Telephone Encounter (Signed)
I s/w the pt and he tells me he does not see any orthopedic with [facility] and he is NOT HAVING KNEE SURGERY WITH DR, Veverly Fells.   Pt states the only procedure he is having is a bladder Bx with Dr. Abner Greenspan with [facility].   I will f/u with [facility] as the clearance may have been sent on the wrong pt or a nem similar to the pt.   Med rec and consent are done.     Patient Consent for Virtual Visit        [name] has provided verbal consent on 12/07/2021 for a virtual visit (video or telephone).   CONSENT FOR VIRTUAL VISIT FOR:  [name]  By participating in this virtual visit I agree to the following:  I hereby voluntarily request, consent and authorize [facility] and its employed or contracted physicians, physician assistants, nurse practitioners or other licensed health care professionals (the Practitioner), to provide me with telemedicine health care services (the "Services") as deemed necessary by the treating Practitioner. I acknowledge and consent to receive the Services by the Practitioner via telemedicine. I understand that the telemedicine visit will involve communicating with the Practitioner through live audiovisual communication technology and the disclosure of certain medical information by electronic transmission. I acknowledge that I have been given the opportunity to request an in-person assessment or other available alternative prior to the telemedicine visit and am voluntarily participating in the telemedicine visit.  I understand that I have the right to withhold or withdraw my consent to the use of telemedicine in the course of my care at any time, without affecting my right to future care or treatment, and that the Practitioner or I may terminate the telemedicine visit at any time. I understand that I have the right to inspect all information obtained and/or recorded in the course of the telemedicine visit and may receive copies of available  information for a reasonable fee.  I understand that some of the potential risks of receiving the Services via telemedicine include:  Delay or interruption in medical evaluation due to technological equipment failure or disruption; Information transmitted may not be sufficient (e.g. poor resolution of images) to allow for appropriate medical decision making by the Practitioner; and/or  In rare instances, security protocols could fail, causing a breach of personal health information.  Furthermore, I acknowledge that it is my responsibility to provide information about my medical history, conditions and care that is complete and accurate to the best of my ability. I acknowledge that Practitioner's advice, recommendations, and/or decision may be based on factors not within their control, such as incomplete or inaccurate data provided by me or distortions of diagnostic images or specimens that may result from electronic transmissions. I understand that the practice of medicine is not an exact science and that Practitioner makes no warranties or guarantees regarding treatment outcomes. I acknowledge that a copy of this consent can be made available to me via my patient portal ([facility]), or I can request a printed copy by calling the office of [facility].    I understand that my insurance will be billed for this visit.   I have read or had this consent read to me. I understand the contents of this consent, which adequately explains the benefits and risks of the Services being provided via telemedicine.  I have been provided ample opportunity to ask questions regarding this consent and the Services and have had my questions  answered to my satisfaction. I give my informed consent for the services to be provided through the use of telemedicine in my medical care

## 2021-12-07 NOTE — Telephone Encounter (Signed)
I s/w the pt and he tells me he does not see any orthopedic with [facility] and he is NOT HAVING KNEE SURGERY WITH DR, Veverly Fells.    Pt states the only procedure he is having is a bladder Bx with Dr. Abner Greenspan with [facility].    I will f/u with [facility] as the clearance may have been sent on the wrong pt or a nem similar to the pt.    Med rec and consent are done.

## 2021-12-07 NOTE — Telephone Encounter (Signed)
Patient with diagnosis of A Fib on Eliquis  for anticoagulation.    Procedure: \cystoscopy bladder biopsy possible transurethral resection of bladder tumor and bilateral retrograde pyelogram  Date of procedure: 01/07/22   CHA2DS2-VASc Score = 5  This indicates a 7.2% annual risk of stroke. The patient's score is based upon: CHF History: 1 HTN History: 1 Diabetes History: 0 Stroke History: 0 Vascular Disease History: 1 Age Score: 2 Gender Score: 0   CrCl 29 mL/min Platelet count 181K   Per office protocol, patient can hold Eliquis for 3 days prior to procedure.    **This guidance is not considered finalized until pre-operative APP has relayed final recommendations.**

## 2021-12-07 NOTE — Telephone Encounter (Signed)
Patient has 2 upcoming procedures:  Cystoscopy bladder biopsy with possible TUR of bladder tumor and bilateral retrograde pyelogram on 01/07/22 And Right TKA on 12/13/21  Pharmacy has advised that patient may hold Eliquis for 3 days prior to each procedure. He will need to resume between procedures. Please schedule for virtual visit on 10/12 or 10/13.

## 2021-12-07 NOTE — Telephone Encounter (Signed)
   Pre-operative Risk Assessment    Patient Name: [name]  DOB: 03/04/1933 MRN: [id number]      Request for Surgical Clearance    Procedure:   RIGHT TKA  Date of Surgery:  Clearance TBD                                 Surgeon:  DR. Esmond Plants Surgeon's Group or Practice Name:  [facility] Phone number:  [phone] Fax number:  [phone] ATTN: MEGAN DAVIS   Type of Clearance Requested:   - Medical  - Pharmacy:  Hold Apixaban (Eliquis)     Type of Anesthesia:   CHOICE   Additional requests/questions:    Jiles Prows   12/07/2021, 10:22 AM

## 2021-12-08 NOTE — Telephone Encounter (Signed)
This encounter was created in error - please disregard.

## 2021-12-08 NOTE — Addendum Note (Signed)
Addended by: Michae Kava on: 12/08/2021 08:49 AM   Modules accepted: Orders, Level of Service

## 2021-12-08 NOTE — Telephone Encounter (Signed)
Left message for Megan with [facility] to please call back in regard to this pt. See notes from last night.

## 2021-12-08 NOTE — Telephone Encounter (Signed)
Megan from [facility] called back. She said she is not sure how this happened, she did not have the pt down for surgery with them. She said they did have a chart on the pt as he was going to see them once in the past, but appt never came about.   We both agreed that anything could have happened on either end. I informed Jinny Blossom that I am going to make this an erroneous encounter. I will call the pt and let him know that I did s/w [facility]. We apologize for any error about a clearance request in his chart that was not his.   I did s/w the pt as well today and let him know that I s/w Megan with [facility] and we both apologize for the error. I did assure the pt that we have it clearly noted the only procedure we are clearing for is bladder Bx with Dr. Abner Greenspan with [facility].  Pt thanked me for the call.

## 2021-12-09 ENCOUNTER — Ambulatory Visit (INDEPENDENT_AMBULATORY_CARE_PROVIDER_SITE_OTHER): Payer: Medicare Other | Admitting: *Deleted

## 2021-12-09 DIAGNOSIS — Z0000 Encounter for general adult medical examination without abnormal findings: Secondary | ICD-10-CM

## 2021-12-09 NOTE — Progress Notes (Signed)
Subjective:   [name] is a 88 y.o. male who presents for Medicare Annual/Subsequent preventive examination.  I connected with  [name] on 12/09/21 by a telephone enabled telemedicine application and verified that I am speaking with the correct person using two identifiers.   I discussed the limitations of evaluation and management by telemedicine. The patient expressed understanding and agreed to proceed.  Patient location: home  Provider location: Tele-health-home   Review of Systems    Cardiac Risk Factors include: advanced age (>51mn, >>76women);obesity (BMI >30kg/m2);sedentary lifestyle;family history of premature cardiovascular disease;hypertension;male gender     Objective:    Today's Vitals   There is no height or weight on file to calculate BMI.     12/09/2021   12:38 PM 07/08/2021    4:02 PM 07/06/2021    9:59 AM 06/29/2021    1:37 PM 06/02/2021    8:40 AM 11/21/2020    3:26 PM 07/16/2018   11:55 AM  Advanced Directives  Does Patient Have a Medical Advance Directive? No No No No No Yes Yes  Type of ATransport plannerLiving will  Does patient want to make changes to medical advance directive?      Yes (Inpatient - patient defers changing a medical advance directive and declines information at this time)   [place]?       No - copy requested  Would patient like information on creating a medical advance directive? No - Patient declined  No - Patient declined  Yes (MAU/Ambulatory/Procedural Areas - Information given)      Current Medications (verified) Outpatient Encounter Medications as of 12/09/2021  Medication Sig   abiraterone acetate (ZYTIGA) 250 MG tablet Take 2 tablets (500 mg total) by mouth daily. Take on an empty stomach 1 hour before or 2 hours after a meal   Aromatic Inhalants (VICKS VAPOINHALER IN) Inhale 1 puff into the lungs at bedtime as needed (congestion).    atorvastatin (LIPITOR) 40 MG tablet TAKE 1 TABLET BY MOUTH ONCE A DAY   ELIQUIS 5 MG TABS tablet TAKE 1 TABLET BY MOUTH TWICE (2) DAILY   finasteride (PROSCAR) 5 MG tablet Take 5 mg by mouth daily.   levothyroxine (SYNTHROID) 137 MCG tablet TAKE 1 TABLET BY MOUTH ONCE A DAY ON AN EMPTY STOMACH   predniSONE (DELTASONE) 5 MG tablet Take 1 tablet (5 mg total) by mouth daily with breakfast.   ramipril (ALTACE) 5 MG capsule Take 1 capsule (5 mg total) by mouth 2 (two) times daily.   sildenafil (REVATIO) 20 MG tablet TAKE 3 TO 5 TABLETS BY MOUTH DAILY AS NEEDED   tamsulosin (FLOMAX) 0.4 MG CAPS capsule Take 0.4 mg by mouth at bedtime.   No facility-administered encounter medications on file as of 12/09/2021.    Allergies (verified) Dabigatran etexilate mesylate, Xarelto [rivaroxaban], and Metoprolol   History: Past Medical History:  Diagnosis Date   [place]    s/p stent graft repair 2012   Arthritis    "right ankle" (04/17/2014)   Ascending [place] dil[place]    Echo 2021: 42 mm // Echocardiogram 10/22: EF 55-60, no RWMA, mildly reduced RVSF, RVSP 39.7, severe BAE, trivial MR, mod TR, trivial AI, mild AS (mean 8), ascending aorta 40 mm   Atrial fibrillation (HCC)    a. permanent;  b. Hematuria on Pradaxa;  c. cold intol and chills with Xarelto  Bladder cancer ([place])    Cardiomyopathy ([place])    a. Echo (03/2013): Inferior and inferolateral AK, EF 40-45%, mild MR, mod LAE, mod RAE, mod TR, mild PI, PASP 46   Chronic kidney disease    COPD (chronic obstructive pulmonary disease) (HCC)    Coronary artery disease s/p cabg x7  1992   a. s/p CABG in 1992 (X 7 - Dr. Redmond Pulling);  b.  Myoview (01/2011): Fixed inferior defect suggestive of prior MI versus diaphragmatic attenuation, apical reversibility possibly suspicious for small area of infarction with peri-infarct ischemia, no significant change from prior study, not gated, low risk study;  c.  Echo (05/2010): Mod LVH,  EF 55-65%, mild AI, mild MR, moderate LAE, mild RAE, moderate    Hematuria bladder tumor ---  followed by Dr Karsten Ro   Hiatal hernia    repaired in 1992 w/[facility]   Hx of cardiovascular stress test    Lexiscan Myoview (03/2013):  No ischemia; not gated.   Hyperglycemia diet controlled   Hyperlipidemia    Hypertension    Hypothyroidism    Obesity    Organic impotence    RBBB    S/P AAA repair using bifurcation graft    Sleep apnea no rx cpap   mild- tested  2012   Ventricular ectopy    mild   Past Surgical History:  Procedure Laterality Date   ABDOMINAL AORTIC ANEURYSM REPAIR  02/28/2010   using bifurcation graft   CARDIAC CATHETERIZATION  03/31/1990   Positive   CARDIOVASCULAR STRESS TEST  02-01-2011  NUCLEAR NO EXERCISE   LOW RISK STUDY.  SMALL APICAL DEFECT. NO EVIDENCE OF MULTIZONE ISCHEMIA.   CORONARY ARTERY BYPASS GRAFT  05/30/1990   CABG X 7   CYSTOSCOPY WITH BIOPSY N/A 07/23/2021   Procedure: CYSTOSCOPY WITH  BLADDER BIOPSY AND BILATERAL RETROGRADE PYELOGRAM;  Surgeon: Janith Lima, MD;  Location: [facility];  Service: Urology;  Laterality: N/A;   ENDOVASCULAR STENT INSERTION  02/15/2011   Procedure: ENDOVASCULAR STENT GRAFT INSERTION;  Surgeon: Hinda Lenis, MD;  Location: [place];  Service: Vascular;  Laterality: N/A;  Insertion of endovascular stent graft    ETT  10/31/2000   WNL   HIATAL HERNIA REPAIR  02/28/1990   "repaired when I had my OHS"   Hospital - Afib  03/28/2002   INGUINAL HERNIA REPAIR Left 03/01/1995   TOTAL ANKLE ARTHROPLASTY Right 04/17/2014   Procedure: RIGHT TOTAL ANKLE ARTHOPLASTY;  Surgeon: Wylene Simmer, MD;  Location: [place];  Service: Orthopedics;  Laterality: Right;   TRANSURETHRAL RESECTION OF BLADDER TUMOR  03/25/2011   Procedure: TRANSURETHRAL RESECTION OF BLADDER TUMOR (TURBT);  Surgeon: Claybon Jabs, MD;  Location[facility];  Service: Urology;  Laterality: N/A;   Family History  Problem Relation Age of Onset   Obesity  Mother 107       deceased   Hypertension Mother    Diabetes Mother    Heart disease Mother        before age 86   Hyperlipidemia Mother    Alcohol abuse Sister 38       deceased   Other Father        deceased unknown   Colon cancer Neg Hx    Prostate cancer Neg Hx    Anesthesia problems Neg Hx    Social History   Socioeconomic History   Marital status: Widowed    Spouse name: Not on file   Number of children: Not on file  Years of education: Not on file   Highest education level: Not on file  Occupational History   Occupation: retired Pension scheme manager.    Employer: retired    Comment: 2001. Likes to play golf . Travels with motor home  Tobacco Use   Smoking status: Former    Packs/day: 1.25    Years: 30.00    Total pack years: 37.50    Types: Cigarettes    Quit date: 02/28/1990    Years since quitting: 31.8   Smokeless tobacco: Never  Vaping Use   Vaping Use: Never used  Substance and Sexual Activity   Alcohol use: No    Alcohol/week: 0.0 standard drinks of alcohol    Comment: basically no alcohol use.  only very rare alcohol use.    Drug use: No   Sexual activity: Yes  Other Topics Concern   Not on file  Social History Narrative   From [place]   Lives with girlfriend.     Divorced, remarried then widowed, 3 children (from 1st marriage)   Likes to golf   Prev 4 sport athlete in [place], football fan.    Social Determinants of Health   Financial Resource Strain: Low Risk  (12/09/2021)   Overall Financial Resource Strain (CARDIA)    Difficulty of Paying Living Expenses: Not hard at all  Food Insecurity: No Food Insecurity (12/09/2021)   Hunger Vital Sign    Worried About Running Out of Food in the Last Year: Never true    Ran Out of Food in the Last Year: Never true  Transportation Needs: No Transportation Needs (12/09/2021)   PRAPARE - Hydrologist (Medical): No    Lack of Transportation (Non-Medical): No  Physical  Activity: Inactive (12/09/2021)   Exercise Vital Sign    Days of Exercise per Week: 0 days    Minutes of Exercise per Session: 0 min  Stress: No Stress Concern Present (12/09/2021)   Industry    Feeling of Stress : Not at all  Social Connections: Socially Isolated (12/09/2021)   Social Connection and Isolation Panel [NHANES]    Frequency of Communication with Friends and Family: Three times a week    Frequency of Social Gatherings with Friends and Family: Once a week    Attends Religious Services: Never    Marine scientist or Organizations: No    Attends Archivist Meetings: Never    Marital Status: Widowed    Tobacco Counseling Counseling given: Not Answered   Clinical Intake:  Pre-visit preparation completed: Yes  Pain : No/denies pain     Diabetes: No  How often do you need to have someone help you when you read instructions, pamphlets, or other written materials from your doctor or pharmacy?: 1 - Never  Diabetic?  no  Interpreter Needed?: No  Information entered by :: Leroy Kennedy LPN   Activities of Daily Living    12/09/2021   12:42 PM 07/06/2021    9:59 AM  In your present state of health, do you have any difficulty performing the following activities:  Hearing? 1 0  Vision? 0 0  Difficulty concentrating or making decisions? 0 0  Walking or climbing stairs? 0 0  Dressing or bathing? 0 0  Doing errands, shopping? 0   Preparing Food and eating ? N   Using the Toilet? N   In the past six months, have you accidently leaked urine? N  Do you have problems with loss of bowel control? N   Managing your Medications? N   Managing your Finances? N   Housekeeping or managing your Housekeeping? N     Patient Care Team: Tonia Ghent, MD as PCP - General (Family Medicine) Sherren Mocha, MD as PCP - Cardiology (Cardiology) Kathie Rhodes, MD (Inactive) as Attending Physician  (Urology) Larey Dresser, MD as Consulting Physician (Cardiology) Sharmon Revere as Physician Assistant (Cardiology) Katheren Puller, RN as Oncology Nurse Navigator  Indicate any recent Medical Services you may have received from other than [facility] providers in the past year (date may be approximate).     Assessment:   This is a routine wellness examination for [place].  Hearing/Vision screen Hearing Screening - Comments:: Some trouble hearing when noisy  No hearing aids Vision Screening - Comments:: Up to date Dr. Katy Fitch  Dietary issues and exercise activities discussed: Current Exercise Habits: The patient does not participate in regular exercise at present, Exercise limited by: Other - see comments (under cancer treatment)   Goals Addressed             This Visit's Progress    Patient Stated       No goals       Depression Screen    12/09/2021   12:43 PM 11/21/2020    3:20 PM 02/06/2020    3:32 PM 07/16/2018   11:09 AM 03/21/2017    9:49 AM 11/30/2015   11:13 AM 11/26/2014    2:18 PM  PHQ 2/9 Scores  PHQ - 2 Score 1 0 0 0 0 0 0  PHQ- 9 Score 3   0       Fall Risk    12/09/2021   12:38 PM 11/21/2020    3:27 PM 02/06/2020    3:32 PM 07/16/2018   11:09 AM 03/21/2017    9:49 AM  [place] in the past year? 0 0 0 0 No  Number falls in past yr: 0 0 0    Injury with Fall? 0 0 0    Follow up Falls evaluation completed;Education provided;Falls prevention discussed  Falls evaluation completed      FALL RISK PREVENTION PERTAINING TO THE HOME:  Any stairs in or around the home? No  If so, are there any without handrails? No  Home free of loose throw rugs in walkways, pet beds, electrical cords, etc? Yes  Adequate lighting in your home to reduce risk of falls? Yes   ASSISTIVE DEVICES UTILIZED TO PREVENT FALLS:  Life alert? No  Use of a cane, walker or w/c? No  Grab bars in the bathroom? Yes  Shower chair or bench in shower? Yes  Elevated toilet seat or a  handicapped toilet? Yes   TIMED UP AND GO:    Cognitive Function:    07/16/2018   11:55 AM 11/30/2015   11:27 AM  MMSE - Mini Mental State Exam  Orientation to time 5 5  Orientation to Place 5 5  Registration 3 3  Attention/ Calculation 0 0  Recall 3 2  Recall-comments  pt was unable to recall 1 of 3 words  Language- name 2 objects 0 0  Language- repeat 1 1  Language- follow 3 step command 0 3  Language- read & follow direction 0 0  Write a sentence 0 0  Copy design 0 0  Total score 17 19        12/09/2021   12:38 PM  11/21/2020    3:38 PM  6CIT Screen  What Year? 0 points 0 points  What month? 0 points 0 points  What time? 0 points 0 points  Count back from 20 0 points 0 points  Months in reverse 4 points 0 points  Repeat phrase 0 points 2 points  Total Score 4 points 2 points    Immunizations Immunization History  Administered Date(s) Administered   Fluad Quad(high Dose 65+) 02/06/2020   Influenza Split 12/28/2010, 12/29/2011   Influenza Whole 01/09/2006, 12/18/2008, 10/27/2009   Influenza,inj,Quad PF,6+ Mos 12/11/2012, 11/22/2013, 11/26/2014, 11/30/2015, 11/29/2018   Influenza-Unspecified 01/28/2017, 11/28/2017   PFIZER(Purple Top)SARS-COV-2 Vaccination 04/13/2019, 05/08/2019, 01/06/2020   Pneumococcal Conjugate-13 11/26/2014   Pneumococcal Polysaccharide-23 03/01/1999, 12/28/2010   Td 11/28/1996, 09/27/2006   Zoster, Live 04/29/2010    TDAP status: Due, Education has been provided regarding the importance of this vaccine. Advised may receive this vaccine at local pharmacy or Health Dept. Aware to provide a copy of the vaccination record if obtained from local pharmacy or Health Dept. Verbalized acceptance and understanding.  Flu Vaccine status: Due, Education has been provided regarding the importance of this vaccine. Advised may receive this vaccine at local pharmacy or Health Dept. Aware to provide a copy of the vaccination record if obtained from local  pharmacy or Health Dept. Verbalized acceptance and understanding.  Pneumococcal vaccine status: Up to date  Covid-19 vaccine status: Information provided on how to obtain vaccines.   Qualifies for Shingles Vaccine? Yes   Zostavax completed No   Shingrix Completed?: No.    Education has been provided regarding the importance of this vaccine. Patient has been advised to call insurance company to determine out of pocket expense if they have not yet received this vaccine. Advised may also receive vaccine at local pharmacy or Health Dept. Verbalized acceptance and understanding.  Screening Tests Health Maintenance  Topic Date Due   COVID-19 Vaccine (4 - Pfizer risk series) 12/25/2021 (Originally 03/02/2020)   Zoster Vaccines- Shingrix (1 of 2) 03/11/2022 (Originally 02/20/1953)   INFLUENZA VACCINE  05/29/2022 (Originally 09/28/2021)   TETANUS/TDAP  12/10/2022 (Originally 09/26/2016)   Pneumonia Vaccine 33+ Years old  Completed   HPV VACCINES  Aged Out    Health Maintenance  There are no preventive care reminders to display for this patient.   Colorectal cancer screening: No longer required.   Lung Cancer Screening: (Low Dose CT Chest recommended if Age 91-80 years, 30 pack-year currently smoking OR have quit w/in 15years.)    Lung Cancer Screening Referral:   Additional Screening:  Hepatitis C Screening: does not  Vision Screening: Recommended annual ophthalmology exams for early detection of glaucoma and other disorders of the eye. Is the patient up to date with their annual eye exam?  Yes  Who is the provider or what is the name of the office in which the patient attends annual eye exams? [facility] If pt is not established with a provider, would they like to be referred to a provider to establish care? No .   Dental Screening: Recommended annual dental exams for proper oral hygiene  Community Resource Referral / Chronic Care Management: CRR required this visit?  No   CCM required  this visit?  No      Plan:     I have personally reviewed and noted the following in the patient's chart:   Medical and social history Use of alcohol, tobacco or illicit drugs  Current medications and supplements including opioid prescriptions. Patient is not  currently taking opioid prescriptions. Functional ability and status Nutritional status Physical activity Advanced directives List of other physicians Hospitalizations, surgeries, and ER visits in previous 12 months Vitals Screenings to include cognitive, depression, and falls Referrals and appointments  In addition, I have reviewed and discussed with patient certain preventive protocols, quality metrics, and best practice recommendations. A written personalized care plan for preventive services as well as general preventive health recommendations were provided to patient.     Leroy Kennedy, LPN   15/17/6160   Nurse Notes:

## 2021-12-09 NOTE — Patient Instructions (Signed)
[name] , Thank you for taking time to come for your Medicare Wellness Visit. I appreciate your ongoing commitment to your health goals. Please review the following plan we discussed and let me know if I can assist you in the future.   These are the goals we discussed:  Goals      Patient Stated     Starting 07/16/18, I will continue to take medications as prescribed.         This is a list of the screening recommended for you and due dates:  Health Maintenance  Topic Date Due   COVID-19 Vaccine (4 - Pfizer risk series) 12/25/2021*   Zoster (Shingles) Vaccine (1 of 2) 03/11/2022*   Flu Shot  05/29/2022*   Tetanus Vaccine  12/10/2022*   Pneumonia Vaccine  Completed   HPV Vaccine  Aged Out  *Topic was postponed. The date shown is not the original due date.    Advanced directives: Education provided    Preventive Care 58 Years and Older, Male  Preventive care refers to lifestyle choices and visits with your health care provider that can promote health and wellness. What does preventive care include? A yearly physical exam. This is also called an annual well check. Dental exams once or twice a year. Routine eye exams. Ask your health care provider how often you should have your eyes checked. Personal lifestyle choices, including: Daily care of your teeth and gums. Regular physical activity. Eating a healthy diet. Avoiding tobacco and drug use. Limiting alcohol use. Practicing safe sex. Taking low doses of aspirin every day. Taking vitamin and mineral supplements as recommended by your health care provider. What happens during an annual well check? The services and screenings done by your health care provider during your annual well check will depend on your age, overall health, lifestyle risk factors, and family history of disease. Counseling  Your health care provider may ask you questions about your: Alcohol use. Tobacco use. Drug use. Emotional well-being. Home  and relationship well-being. Sexual activity. Eating habits. History of falls. Memory and ability to understand (cognition). Work and work Statistician. Screening  You may have the following tests or measurements: Height, weight, and BMI. Blood pressure. Lipid and cholesterol levels. These may be checked every 5 years, or more frequently if you are over 86 years old. Skin check. Lung cancer screening. You may have this screening every year starting at age 86 if you have a 30-pack-year history of smoking and currently smoke or have quit within the past 15 years. Fecal occult blood test (FOBT) of the stool. You may have this test every year starting at age 86. Flexible sigmoidoscopy or colonoscopy. You may have a sigmoidoscopy every 5 years or a colonoscopy every 10 years starting at age 86. Prostate cancer screening. Recommendations will vary depending on your family history and other risks. Hepatitis C blood test. Hepatitis B blood test. Sexually transmitted disease (STD) testing. Diabetes screening. This is done by checking your blood sugar (glucose) after you have not eaten for a while (fasting). You may have this done every 1-3 years. Abdominal aortic aneurysm (AAA) screening. You may need this if you are a current or former smoker. Osteoporosis. You may be screened starting at age 86 if you are at high risk. Talk with your health care provider about your test results, treatment options, and if necessary, the need for more tests. Vaccines  Your health care provider may recommend certain vaccines, such as: Influenza vaccine. This is recommended  every year. Tetanus, diphtheria, and acellular pertussis (Tdap, Td) vaccine. You may need a Td booster every 10 years. Zoster vaccine. You may need this after age 15. Pneumococcal 13-valent conjugate (PCV13) vaccine. One dose is recommended after age 86. Pneumococcal polysaccharide (PPSV23) vaccine. One dose is recommended after age 86. Talk to  your health care provider about which screenings and vaccines you need and how often you need them. This information is not intended to replace advice given to you by your health care provider. Make sure you discuss any questions you have with your health care provider. Document Released: 03/13/2015 Document Revised: 11/04/2015 Document Reviewed: 12/16/2014 Elsevier Interactive Patient Education  2017 [place] Prevention in the Home Falls can cause injuries. They can happen to people of all ages. There are many things you can do to make your home safe and to help prevent falls. What can I do on the outside of my home? Regularly fix the edges of walkways and driveways and fix any cracks. Remove anything that might make you trip as you walk through a door, such as a raised step or threshold. Trim any bushes or trees on the path to your home. Use bright outdoor lighting. Clear any walking paths of anything that might make someone trip, such as rocks or tools. Regularly check to see if handrails are loose or broken. Make sure that both sides of any steps have handrails. Any raised decks and porches should have guardrails on the edges. Have any leaves, snow, or ice cleared regularly. Use sand or salt on walking paths during winter. Clean up any spills in your garage right away. This includes oil or grease spills. What can I do in the bathroom? Use night lights. Install grab bars by the toilet and in the tub and shower. Do not use towel bars as grab bars. Use non-skid mats or decals in the tub or shower. If you need to sit down in the shower, use a plastic, non-slip stool. Keep the floor dry. Clean up any water that spills on the floor as soon as it happens. Remove soap buildup in the tub or shower regularly. Attach bath mats securely with double-sided non-slip rug tape. Do not have throw rugs and other things on the floor that can make you trip. What can I do in the bedroom? Use  night lights. Make sure that you have a light by your bed that is easy to reach. Do not use any sheets or blankets that are too big for your bed. They should not hang down onto the floor. Have a firm chair that has side arms. You can use this for support while you get dressed. Do not have throw rugs and other things on the floor that can make you trip. What can I do in the kitchen? Clean up any spills right away. Avoid walking on wet floors. Keep items that you use a lot in easy-to-reach places. If you need to reach something above you, use a strong step stool that has a grab bar. Keep electrical cords out of the way. Do not use floor polish or wax that makes floors slippery. If you must use wax, use non-skid floor wax. Do not have throw rugs and other things on the floor that can make you trip. What can I do with my stairs? Do not leave any items on the stairs. Make sure that there are handrails on both sides of the stairs and use them. Fix handrails that are broken  or loose. Make sure that handrails are as long as the stairways. Check any carpeting to make sure that it is firmly attached to the stairs. Fix any carpet that is loose or worn. Avoid having throw rugs at the top or bottom of the stairs. If you do have throw rugs, attach them to the floor with carpet tape. Make sure that you have a light switch at the top of the stairs and the bottom of the stairs. If you do not have them, ask someone to add them for you. What else can I do to help prevent falls? Wear shoes that: Do not have high heels. Have rubber bottoms. Are comfortable and fit you well. Are closed at the toe. Do not wear sandals. If you use a stepladder: Make sure that it is fully opened. Do not climb a closed stepladder. Make sure that both sides of the stepladder are locked into place. Ask someone to hold it for you, if possible. Clearly mark and make sure that you can see: Any grab bars or handrails. First and last  steps. Where the edge of each step is. Use tools that help you move around (mobility aids) if they are needed. These include: Canes. Walkers. Scooters. Crutches. Turn on the lights when you go into a dark area. Replace any light bulbs as soon as they burn out. Set up your furniture so you have a clear path. Avoid moving your furniture around. If any of your floors are uneven, fix them. If there are any pets around you, be aware of where they are. Review your medicines with your doctor. Some medicines can make you feel dizzy. This can increase your chance of falling. Ask your doctor what other things that you can do to help prevent falls. This information is not intended to replace advice given to you by your health care provider. Make sure you discuss any questions you have with your health care provider. Document Released: 12/11/2008 Document Revised: 07/23/2015 Document Reviewed: 03/21/2014 Elsevier Interactive Patient Education  2017 [employer].

## 2021-12-14 ENCOUNTER — Other Ambulatory Visit (HOSPITAL_COMMUNITY): Payer: Self-pay

## 2021-12-16 NOTE — Patient Instructions (Addendum)
SURGICAL WAITING ROOM VISITATION Patients having surgery or a procedure may have no more than 2 support people in the waiting area - these visitors may rotate.   Children under the age of 36 must have an adult with them who is not the patient. If the patient needs to stay at the hospital during part of their recovery, the visitor guidelines for inpatient rooms apply. Pre-op nurse will coordinate an appropriate time for 1 support person to accompany patient in pre-op.  This support person may not rotate.    Please refer to the [facility] website for the visitor guidelines for Inpatients (after your surgery is over and you are in a regular room).      Your procedure is scheduled on: 01-07-22   Report to [facility] Main Entrance    Report to admitting at 11:30 AM   Call this number if you have problems the morning of surgery [phone]   Do not eat food or drink liquids :After Midnight.           If you have questions, please contact your surgeon's office.   FOLLOW  ANY ADDITIONAL PRE OP INSTRUCTIONS YOU RECEIVED FROM YOUR SURGEON'S OFFICE!!!     Oral Hygiene is also important to reduce your risk of infection.                                    Remember - BRUSH YOUR TEETH THE MORNING OF SURGERY WITH YOUR REGULAR TOOTHPASTE   Do NOT smoke after Midnight   Take these medicines the morning of surgery with A SIP OF WATER:   Zytiga  Atorvastatin  Finasteride  Levothyroxine  Prednisone                              You may not have any metal on your body including  jewelry, and body piercing             Do not wear  lotions, powders, cologne, or deodorant              Men may shave face and neck.   Do not bring valuables to the hospital. [place].   Contacts, dentures or bridgework may not be worn into surgery.   DO NOT [place]. PHARMACY WILL DISPENSE MEDICATIONS LISTED ON YOUR MEDICATION LIST  TO YOU DURING YOUR ADMISSION [place]!    Patients discharged on the day of surgery will not be allowed to drive home.  Someone NEEDS to stay with you for the first 24 hours after anesthesia.              Please read over the following fact sheets you were given: IF [place]  If you received a COVID test during your pre-op visit  it is requested that you wear a mask when out in public, stay away from anyone that may not be feeling well and notify your surgeon if you develop symptoms. If you test positive for Covid or have been in contact with anyone that has tested positive in the last 10 days please notify you surgeon.  [place] - Preparing for Surgery Before surgery, you can play an important role.  Because skin is not sterile, your skin  needs to be as free of germs as possible.  You can reduce the number of germs on your skin by washing with CHG (chlorahexidine gluconate) soap before surgery.  CHG is an antiseptic cleaner which kills germs and bonds with the skin to continue killing germs even after washing. Please DO NOT use if you have an allergy to CHG or antibacterial soaps.  If your skin becomes reddened/irritated stop using the CHG and inform your nurse when you arrive at Short Stay. Do not shave (including legs and underarms) for at least 48 hours prior to the first CHG shower.  You may shave your face/neck.  Please follow these instructions carefully:  1.  Shower with CHG Soap the night before surgery and the  morning of surgery.  2.  If you choose to wash your hair, wash your hair first as usual with your normal  shampoo.  3.  After you shampoo, rinse your hair and body thoroughly to remove the shampoo.                             4.  Use CHG as you would any other liquid soap.  You can apply chg directly to the skin and wash.  Gently with a scrungie or clean washcloth.  5.  Apply the CHG Soap to your body  ONLY FROM THE NECK DOWN.   Do   not use on face/ open                           Wound or open sores. Avoid contact with eyes, ears mouth and   genitals (private parts).                       Wash face,  Genitals (private parts) with your normal soap.             6.  Wash thoroughly, paying special attention to the area where your    surgery  will be performed.  7.  Thoroughly rinse your body with warm water from the neck down.  8.  DO NOT shower/wash with your normal soap after using and rinsing off the CHG Soap.                9.  Pat yourself dry with a clean towel.            10.  Wear clean pajamas.            11.  Place clean sheets on your bed the night of your first shower and do not  sleep with pets. Day of Surgery : Do not apply any lotions/deodorants the morning of surgery.  Please wear clean clothes to the hospital/surgery center.  FAILURE TO FOLLOW THESE INSTRUCTIONS MAY RESULT IN THE CANCELLATION OF YOUR SURGERY  PATIENT SIGNATURE_________________________________  NURSE SIGNATURE__________________________________  ________________________________________________________________________

## 2021-12-16 NOTE — Progress Notes (Addendum)
COVID Vaccine Completed:  Yes  Date of COVID positive in last 90 days:  No  PCP - Elsie Stain, MD Cardiologist - Sherren Mocha, MD (clearance telehealth scheduled for 12-31-21)  Chest x-ray - CT chest 06-18-21 EKG - 06-24-21 Epic Stress Test - 09-24-19 Epic ECHO - 12-09-20 Epic Cardiac Cath - 1992 Epic Pacemaker/ICD device last checked: Spinal Cord Stimulator:  N/A  Bowel Prep - N/A  Sleep Study - Yes, mild sleep apnea CPAP - No  Fasting Blood Sugar - N/A Checks Blood Sugar _____ times a day  Blood Thinner Instructions:  Eliquis.  Per patient to stop 2 days before surgery Aspirin Instructions: Last Dose:  Activity level:  Can go up a flight of stairs and perform activities of daily living without stopping and without symptoms of chest pain or shortness of breath.  Anesthesia review: Afib, CAD, AAA repair, CHF, COPD, HTN, mild aortic stenosis, RBBB.  Hx of CABG  BP elevated at PAT appt  179/97 and on recheck 157/107.  Patient denies chest pain, headache or SOB.  States that he has white coat HTN.  Monitors at home and it is within normal limits at home  Patient denies shortness of breath, fever, cough and chest pain at PAT appointment  Patient verbalized understanding of instructions that were given to them at the PAT appointment. Patient was also instructed that they will need to review over the PAT instructions again at home before surgery.

## 2021-12-17 ENCOUNTER — Other Ambulatory Visit: Payer: Self-pay | Admitting: Urology

## 2021-12-17 NOTE — Progress Notes (Signed)
Left voicemail for [name] to request pre op orders in epic.

## 2021-12-21 ENCOUNTER — Encounter (HOSPITAL_COMMUNITY): Payer: Self-pay

## 2021-12-21 ENCOUNTER — Other Ambulatory Visit: Payer: Self-pay

## 2021-12-21 ENCOUNTER — Encounter (HOSPITAL_COMMUNITY)
Admission: RE | Admit: 2021-12-21 | Discharge: 2021-12-21 | Disposition: A | Discharge status: Home / Self Care | Payer: Medicare Other | Source: Ambulatory Visit | Attending: Urology | Admitting: Urology

## 2021-12-21 VITALS — BP 157/107 | HR 79 | Temp 97.7°F | Resp 12 | Ht 71.0 in | Wt 257.8 lb

## 2021-12-21 DIAGNOSIS — I2510 Atherosclerotic heart disease of native coronary artery without angina pectoris: Secondary | ICD-10-CM | POA: Insufficient documentation

## 2021-12-21 DIAGNOSIS — Z01818 Encounter for other preprocedural examination: Secondary | ICD-10-CM | POA: Insufficient documentation

## 2021-12-21 HISTORY — DX: Malignant neoplasm of prostate: C61

## 2021-12-21 LAB — BASIC METABOLIC PANEL
Anion gap: 8 (ref 5–15)
BUN: 13 mg/dL (ref 8–23)
CO2: 29 mmol/L (ref 22–32)
Calcium: 9.4 mg/dL (ref 8.9–10.3)
Chloride: 104 mmol/L (ref 98–111)
Creatinine, Ser: 0.96 mg/dL (ref 0.61–1.24)
GFR, Estimated: >60 mL/min (ref >60)
Glucose, Bld: 121 mg/dL — ABNORMAL HIGH (ref 70–99)
Potassium: 3.8 mmol/L (ref 3.5–5.1)
Sodium: 141 mmol/L (ref 135–145)

## 2021-12-22 DIAGNOSIS — C61 Malignant neoplasm of prostate: Secondary | ICD-10-CM | POA: Diagnosis not present

## 2021-12-22 DIAGNOSIS — C778 Secondary and unspecified malignant neoplasm of lymph nodes of multiple regions: Secondary | ICD-10-CM | POA: Diagnosis not present

## 2021-12-22 DIAGNOSIS — R3912 Poor urinary stream: Secondary | ICD-10-CM | POA: Diagnosis not present

## 2021-12-22 DIAGNOSIS — C678 Malignant neoplasm of overlapping sites of bladder: Secondary | ICD-10-CM | POA: Diagnosis not present

## 2021-12-22 DIAGNOSIS — N401 Enlarged prostate with lower urinary tract symptoms: Secondary | ICD-10-CM | POA: Diagnosis not present

## 2021-12-31 ENCOUNTER — Ambulatory Visit: Payer: Medicare Other | Attending: Cardiovascular Disease | Admitting: Nurse Practitioner

## 2021-12-31 DIAGNOSIS — Z01810 Encounter for preprocedural cardiovascular examination: Secondary | ICD-10-CM | POA: Diagnosis not present

## 2021-12-31 NOTE — Progress Notes (Signed)
Virtual Visit via Telephone Note   Because of [name]'s co-morbid illnesses, he is at least at moderate risk for complications without adequate follow up.  This format is felt to be most appropriate for this patient at this time.  The patient did not have access to video technology/had technical difficulties with video requiring transitioning to audio format only (telephone).  All issues noted in this document were discussed and addressed.  No physical exam could be performed with this format.  Please refer to the patient's chart for his consent to telehealth for [facility].  Evaluation Performed:  Preoperative cardiovascular risk assessment _____________   Date:  12/31/2021   Patient ID:  [name], DOB 1933-10-03, MRN [id number] Patient Location:  Home Provider location:   Office  Primary Care Provider:  Tonia Ghent, MD Primary Cardiologist:  Sherren Mocha, MD  Chief Complaint / Patient Profile   88 y.o. y/o male with a h/o CAD s/p CABG, ICM, hypertension, hyperlipidemia, permanent atrial fibrillation, PVCs, RBBB, abdominal aortic aneurysm s/p EVAR, dilated ascending aorta, mild aortic valve stenosis, moderate tricuspid valve regurgitation, CKD stage II, COPD, hypothyroidism, prostate cancer and bladder cancer who is pending cystoscopy with bladder biopsy, possible TUR of bladder tumor and bilateral retrograde pyelogram on 01/07/2022 with Dr. Rexene Alberts of [facility] and presents today for telephonic preoperative cardiovascular risk assessment.  Past Medical History    Past Medical History:  Diagnosis Date   AAA (abdominal aortic aneurysm) ([place])    s/p stent graft repair 2012   Arthritis    "right ankle" (04/17/2014)   Ascending aorta dilatation ([place])    Echo 2021: 42 mm // Echocardiogram 10/22: EF 55-60, no RWMA, mildly reduced RVSF, RVSP 39.7, severe BAE, trivial MR, mod TR, trivial AI, mild AS (mean 8), ascending aorta 40 mm   Atrial  fibrillation (HCC)    a. permanent;  b. Hematuria on Pradaxa;  c. cold intol and chills with Xarelto   Bladder cancer ([place])    Cardiomyopathy ([place])    a. Echo (03/2013): Inferior and inferolateral AK, EF 40-45%, mild MR, mod LAE, mod RAE, mod TR, mild PI, PASP 46   Chronic kidney disease    COPD (chronic obstructive pulmonary disease) (HCC)    Coronary artery disease s/p cabg x7  1992   a. s/p CABG in 1992 (X 7 - Dr. Redmond Pulling);  b.  Myoview (01/2011): Fixed inferior defect suggestive of prior MI versus diaphragmatic attenuation, apical reversibility possibly suspicious for small area of infarction with peri-infarct ischemia, no significant change from prior study, not gated, low risk study;  c.  Echo (05/2010): Mod LVH, EF 55-65%, mild AI, mild MR, moderate LAE, mild RAE, moderate    Hematuria bladder tumor ---  followed by Dr Karsten Ro   Hiatal hernia    repaired in 1992 w/[facility]   Hx of cardiovascular stress test    Lexiscan Myoview (03/2013):  No ischemia; not gated.   Hyperglycemia diet controlled   Hyperlipidemia    Hypertension    Hypothyroidism    Obesity    Organic impotence    Prostate cancer ([place])    RBBB    S/P AAA repair using bifurcation graft    Sleep apnea no rx cpap   mild- tested  2012   Ventricular ectopy    mild   Past Surgical History:  Procedure Laterality Date   ABDOMINAL AORTIC ANEURYSM REPAIR  02/28/2010   using bifurcation graft   CARDIAC CATHETERIZATION  03/31/1990   Positive   CARDIOVASCULAR STRESS TEST  02-01-2011  NUCLEAR NO EXERCISE   LOW RISK STUDY.  SMALL APICAL DEFECT. NO EVIDENCE OF MULTIZONE ISCHEMIA.   CORONARY ARTERY BYPASS GRAFT  05/30/1990   CABG X 7   CYSTOSCOPY WITH BIOPSY N/A 07/23/2021   Procedure: CYSTOSCOPY WITH  BLADDER BIOPSY AND BILATERAL RETROGRADE PYELOGRAM;  Surgeon: Janith Lima, MD;  Location: [facility];  Service: Urology;  Laterality: N/A;   ENDOVASCULAR STENT INSERTION  02/15/2011   Procedure: ENDOVASCULAR STENT GRAFT INSERTION;   Surgeon: Hinda Lenis, MD;  Location: [facility];  Service: Vascular;  Laterality: N/A;  Insertion of endovascular stent graft    ETT  10/31/2000   WNL   HIATAL HERNIA REPAIR  02/28/1990   "repaired when I had my OHS"   Hospital - Afib  03/28/2002   INGUINAL HERNIA REPAIR Left 03/01/1995   TOTAL ANKLE ARTHROPLASTY Right 04/17/2014   Procedure: RIGHT TOTAL ANKLE ARTHOPLASTY;  Surgeon: Wylene Simmer, MD;  Location: [facility];  Service: Orthopedics;  Laterality: Right;   TRANSURETHRAL RESECTION OF BLADDER TUMOR  03/25/2011   Procedure: TRANSURETHRAL RESECTION OF BLADDER TUMOR (TURBT);  Surgeon: Claybon Jabs, MD;  Location[facility];  Service: Urology;  Laterality: N/A;    Allergies  Allergies  Allergen Reactions   Dabigatran Etexilate Mesylate     Blood in urine   Xarelto [Rivaroxaban] Shortness Of Breath and Other (See Comments)    SOB, CHILLS   Metoprolol     Upset stomach    History of Present Illness    [name] is a 88 y.o. male who presents via audio/video conferencing for a telehealth visit today.  Pt was last seen in cardiology clinic on 06/24/2021 by Ambrose Pancoast, NP.  At that time [name] was doing well.  The patient is now pending procedure as outlined above. Since his last visit, he has been stable from a cardiac standpoint. He does note some fatigue since starting cancer treatment. He denies chest pain, palpitations, dyspnea, pnd, orthopnea, n, v, dizziness, syncope, edema, weight gain, or early satiety. All other systems reviewed and are otherwise negative except as noted above.   Home Medications    Prior to Admission medications   Medication Sig Start Date End Date Taking? Authorizing Provider  abiraterone acetate (ZYTIGA) 250 MG tablet Take 2 tablets (500 mg total) by mouth daily. Take on an empty stomach 1 hour before or 2 hours after a meal 12/07/21   Wyatt Portela, MD  atorvastatin (LIPITOR) 40 MG tablet TAKE 1 TABLET BY MOUTH ONCE A  DAY 03/10/21   Tonia Ghent, MD  ELIQUIS 5 MG TABS tablet TAKE 1 TABLET BY MOUTH TWICE (2) DAILY Patient taking differently: Take 5 mg by mouth daily. 12/06/21   Sherren Mocha, MD  finasteride (PROSCAR) 5 MG tablet Take 5 mg by mouth daily.    [provider]  levothyroxine (SYNTHROID) 137 MCG tablet TAKE 1 TABLET BY MOUTH ONCE A DAY ON AN EMPTY STOMACH 03/10/21   Tonia Ghent, MD  predniSONE (DELTASONE) 5 MG tablet Take 1 tablet (5 mg total) by mouth daily with breakfast. 07/29/21   Wyatt Portela, MD  ramipril (ALTACE) 5 MG capsule Take 1 capsule (5 mg total) by mouth 2 (two) times daily. Patient taking differently: Take 5 mg by mouth daily. 06/22/21   Tonia Ghent, MD  sildenafil (REVATIO) 20 MG tablet TAKE 3 TO 5 TABLETS BY MOUTH  DAILY AS NEEDED 02/06/20   Tonia Ghent, MD  tamsulosin (FLOMAX) 0.4 MG CAPS capsule Take 0.4 mg by mouth at bedtime.    [provider]    Physical Exam    Vital Signs:  [name] does not have vital signs available for review today.  Given telephonic nature of communication, physical exam is limited. AAOx3. NAD. Normal affect.  Speech and respirations are unlabored.  Accessory Clinical Findings    None  Assessment & Plan    1.  Preoperative Cardiovascular Risk Assessment:  According to the Revised Cardiac Risk Index (RCRI), his Perioperative Risk of Major Cardiac Event is (%): 6.6. His Functional Capacity in METs is: 6.36 according to the [facility] Activity Status Index (DASI). Therefore, based on ACC/AHA guidelines, patient would be at acceptable risk for the planned procedure without further cardiovascular testing.   The patient was advised that if he develops new symptoms prior to surgery to contact our office to arrange for a follow-up visit, and he verbalized understanding.   Per office protocol, patient can hold Eliquis for 3 days prior to procedure. Please resume Eliquis as soon as possible postprocedure, at the  discretion of the surgeon.   A copy of this note will be routed to requesting surgeon.  Time:   Today, I have spent 6 minutes with the patient with telehealth technology discussing medical history, symptoms, and management plan.     Lenna Sciara, NP  12/31/2021, 10:44 AM

## 2022-01-01 ENCOUNTER — Other Ambulatory Visit: Payer: Self-pay | Admitting: Oncology

## 2022-01-05 ENCOUNTER — Other Ambulatory Visit: Payer: Self-pay | Admitting: Oncology

## 2022-01-05 ENCOUNTER — Other Ambulatory Visit (HOSPITAL_COMMUNITY): Payer: Self-pay

## 2022-01-05 DIAGNOSIS — C772 Secondary and unspecified malignant neoplasm of intra-abdominal lymph nodes: Secondary | ICD-10-CM

## 2022-01-06 ENCOUNTER — Other Ambulatory Visit: Payer: Self-pay | Admitting: *Deleted

## 2022-01-06 ENCOUNTER — Other Ambulatory Visit (HOSPITAL_COMMUNITY): Payer: Self-pay

## 2022-01-06 DIAGNOSIS — C61 Malignant neoplasm of prostate: Secondary | ICD-10-CM

## 2022-01-06 MED ORDER — ABIRATERONE ACETATE 250 MG PO TABS
500.0000 mg | ORAL_TABLET | Freq: Every day | ORAL | 0 refills | Status: DC
  Filled 2022-01-06: qty 60, 30d supply, fill #0

## 2022-01-07 ENCOUNTER — Encounter (HOSPITAL_COMMUNITY)
Admission: RE | Disposition: A | Discharge status: Home / Self Care | Payer: Self-pay | Source: Home / Self Care | Attending: Urology

## 2022-01-07 ENCOUNTER — Ambulatory Visit (HOSPITAL_BASED_OUTPATIENT_CLINIC_OR_DEPARTMENT_OTHER): Payer: Medicare Other | Admitting: Anesthesiology

## 2022-01-07 ENCOUNTER — Ambulatory Visit (HOSPITAL_COMMUNITY): Payer: Medicare Other | Admitting: Physician Assistant

## 2022-01-07 ENCOUNTER — Ambulatory Visit (HOSPITAL_COMMUNITY)
Admission: RE | Admit: 2022-01-07 | Discharge: 2022-01-07 | Disposition: A | Discharge status: Home / Self Care | Payer: Medicare Other | Attending: Urology | Admitting: Urology

## 2022-01-07 ENCOUNTER — Encounter (HOSPITAL_COMMUNITY): Payer: Self-pay | Admitting: Urology

## 2022-01-07 ENCOUNTER — Ambulatory Visit (HOSPITAL_COMMUNITY): Payer: Medicare Other

## 2022-01-07 DIAGNOSIS — N189 Chronic kidney disease, unspecified: Secondary | ICD-10-CM | POA: Insufficient documentation

## 2022-01-07 DIAGNOSIS — Z8551 Personal history of malignant neoplasm of bladder: Secondary | ICD-10-CM | POA: Diagnosis not present

## 2022-01-07 DIAGNOSIS — C679 Malignant neoplasm of bladder, unspecified: Secondary | ICD-10-CM

## 2022-01-07 DIAGNOSIS — Z87891 Personal history of nicotine dependence: Secondary | ICD-10-CM | POA: Insufficient documentation

## 2022-01-07 DIAGNOSIS — I509 Heart failure, unspecified: Secondary | ICD-10-CM

## 2022-01-07 DIAGNOSIS — I110 Hypertensive heart disease with heart failure: Secondary | ICD-10-CM | POA: Diagnosis not present

## 2022-01-07 DIAGNOSIS — I130 Hypertensive heart and chronic kidney disease with heart failure and stage 1 through stage 4 chronic kidney disease, or unspecified chronic kidney disease: Secondary | ICD-10-CM | POA: Diagnosis not present

## 2022-01-07 DIAGNOSIS — C61 Malignant neoplasm of prostate: Secondary | ICD-10-CM | POA: Diagnosis not present

## 2022-01-07 DIAGNOSIS — D303 Benign neoplasm of bladder: Secondary | ICD-10-CM | POA: Diagnosis not present

## 2022-01-07 DIAGNOSIS — M1990 Unspecified osteoarthritis, unspecified site: Secondary | ICD-10-CM | POA: Insufficient documentation

## 2022-01-07 DIAGNOSIS — E039 Hypothyroidism, unspecified: Secondary | ICD-10-CM | POA: Insufficient documentation

## 2022-01-07 DIAGNOSIS — Z79899 Other long term (current) drug therapy: Secondary | ICD-10-CM | POA: Diagnosis not present

## 2022-01-07 DIAGNOSIS — Z85830 Personal history of malignant neoplasm of bone: Secondary | ICD-10-CM | POA: Diagnosis not present

## 2022-01-07 DIAGNOSIS — E785 Hyperlipidemia, unspecified: Secondary | ICD-10-CM | POA: Diagnosis not present

## 2022-01-07 DIAGNOSIS — I082 Rheumatic disorders of both aortic and tricuspid valves: Secondary | ICD-10-CM | POA: Insufficient documentation

## 2022-01-07 DIAGNOSIS — Z6835 Body mass index (BMI) 35.0-35.9, adult: Secondary | ICD-10-CM | POA: Insufficient documentation

## 2022-01-07 DIAGNOSIS — E669 Obesity, unspecified: Secondary | ICD-10-CM | POA: Diagnosis not present

## 2022-01-07 DIAGNOSIS — Z951 Presence of aortocoronary bypass graft: Secondary | ICD-10-CM | POA: Diagnosis not present

## 2022-01-07 DIAGNOSIS — J449 Chronic obstructive pulmonary disease, unspecified: Secondary | ICD-10-CM | POA: Diagnosis not present

## 2022-01-07 DIAGNOSIS — Z7901 Long term (current) use of anticoagulants: Secondary | ICD-10-CM | POA: Diagnosis not present

## 2022-01-07 DIAGNOSIS — Z08 Encounter for follow-up examination after completed treatment for malignant neoplasm: Secondary | ICD-10-CM | POA: Diagnosis not present

## 2022-01-07 DIAGNOSIS — I2510 Atherosclerotic heart disease of native coronary artery without angina pectoris: Secondary | ICD-10-CM

## 2022-01-07 DIAGNOSIS — Z8546 Personal history of malignant neoplasm of prostate: Secondary | ICD-10-CM | POA: Diagnosis not present

## 2022-01-07 DIAGNOSIS — I4891 Unspecified atrial fibrillation: Secondary | ICD-10-CM | POA: Insufficient documentation

## 2022-01-07 DIAGNOSIS — C7982 Secondary malignant neoplasm of genital organs: Secondary | ICD-10-CM

## 2022-01-07 HISTORY — PX: CYSTOSCOPY: SHX5120

## 2022-01-07 LAB — CBC WITH DIFFERENTIAL/PLATELET
Abs Immature Granulocytes: 0.02 10*3/uL (ref 0.00–0.07)
Basophils Absolute: 0.1 10*3/uL (ref 0.0–0.1)
Basophils Relative: 1 %
Eosinophils Absolute: 0.1 10*3/uL (ref 0.0–0.5)
Eosinophils Relative: 2 %
HCT: 41.5 % (ref 39.0–52.0)
Hemoglobin: 13.4 g/dL (ref 13.0–17.0)
Immature Granulocytes: 0 %
Lymphocytes Relative: 25 %
Lymphs Abs: 1.4 10*3/uL (ref 0.7–4.0)
MCH: 29.9 pg (ref 26.0–34.0)
MCHC: 32.3 g/dL (ref 30.0–36.0)
MCV: 92.6 fL (ref 80.0–100.0)
Monocytes Absolute: 0.5 10*3/uL (ref 0.1–1.0)
Monocytes Relative: 9 %
Neutro Abs: 3.6 10*3/uL (ref 1.7–7.7)
Neutrophils Relative %: 63 %
Platelets: 177 10*3/uL (ref 150–400)
RBC: 4.48 MIL/uL (ref 4.22–5.81)
RDW: 13.3 % (ref 11.5–15.5)
WBC: 5.7 10*3/uL (ref 4.0–10.5)
nRBC: 0 % (ref 0.0–0.2)

## 2022-01-07 SURGERY — CYSTOSCOPY
Anesthesia: General

## 2022-01-07 MED ORDER — FENTANYL CITRATE PF 50 MCG/ML IJ SOSY: 25.0000 ug | PREFILLED_SYRINGE | INTRAMUSCULAR | Status: DC | PRN

## 2022-01-07 MED ORDER — PHENYLEPHRINE HCL (PRESSORS) 10 MG/ML IV SOLN
INTRAVENOUS | Status: AC
  Filled 2022-01-07: qty 1

## 2022-01-07 MED ORDER — LIDOCAINE HCL (PF) 2 % IJ SOLN
INTRAMUSCULAR | Status: AC
  Filled 2022-01-07: qty 5

## 2022-01-07 MED ORDER — SODIUM CHLORIDE 0.9 % IR SOLN
Status: DC | PRN
  Administered 2022-01-07: 3000 mL via INTRAVESICAL

## 2022-01-07 MED ORDER — ROCURONIUM BROMIDE 10 MG/ML (PF) SYRINGE
PREFILLED_SYRINGE | INTRAVENOUS | Status: DC | PRN
  Administered 2022-01-07: 70 mg via INTRAVENOUS

## 2022-01-07 MED ORDER — PHENYLEPHRINE 80 MCG/ML (10ML) SYRINGE FOR IV PUSH (FOR BLOOD PRESSURE SUPPORT)
PREFILLED_SYRINGE | INTRAVENOUS | Status: DC | PRN
  Administered 2022-01-07 (×5): 160 ug via INTRAVENOUS

## 2022-01-07 MED ORDER — PROMETHAZINE HCL 25 MG/ML IJ SOLN: 6.2500 mg | INTRAMUSCULAR | Status: DC | PRN

## 2022-01-07 MED ORDER — ONDANSETRON HCL 4 MG/2ML IJ SOLN
INTRAMUSCULAR | Status: AC
  Filled 2022-01-07: qty 2

## 2022-01-07 MED ORDER — DOCUSATE SODIUM 100 MG PO CAPS: 100.0000 mg | ORAL_CAPSULE | Freq: Every day | ORAL | 0 refills | Status: DC | PRN

## 2022-01-07 MED ORDER — CEFAZOLIN SODIUM-DEXTROSE 2-4 GM/100ML-% IV SOLN
2.0000 g | INTRAVENOUS | Status: AC
  Administered 2022-01-07: 2 g via INTRAVENOUS
  Filled 2022-01-07: qty 100

## 2022-01-07 MED ORDER — PROPOFOL 10 MG/ML IV BOLUS
INTRAVENOUS | Status: AC
  Filled 2022-01-07: qty 20

## 2022-01-07 MED ORDER — ORAL CARE MOUTH RINSE: 15.0000 mL | Freq: Once | OROMUCOSAL | Status: AC

## 2022-01-07 MED ORDER — 0.9 % SODIUM CHLORIDE (POUR BTL) OPTIME
TOPICAL | Status: DC | PRN
  Administered 2022-01-07: 1000 mL

## 2022-01-07 MED ORDER — DEXAMETHASONE SODIUM PHOSPHATE 10 MG/ML IJ SOLN
INTRAMUSCULAR | Status: AC
  Filled 2022-01-07: qty 1

## 2022-01-07 MED ORDER — LACTATED RINGERS IV SOLN: INTRAVENOUS | Status: DC

## 2022-01-07 MED ORDER — PROPOFOL 10 MG/ML IV BOLUS
INTRAVENOUS | Status: DC | PRN
  Administered 2022-01-07: 130 mg via INTRAVENOUS

## 2022-01-07 MED ORDER — LIDOCAINE 2% (20 MG/ML) 5 ML SYRINGE
INTRAMUSCULAR | Status: DC | PRN
  Administered 2022-01-07: 100 mg via INTRAVENOUS

## 2022-01-07 MED ORDER — SUGAMMADEX SODIUM 500 MG/5ML IV SOLN
INTRAVENOUS | Status: AC
  Filled 2022-01-07: qty 5

## 2022-01-07 MED ORDER — FENTANYL CITRATE (PF) 100 MCG/2ML IJ SOLN
INTRAMUSCULAR | Status: DC | PRN
  Administered 2022-01-07: 100 ug via INTRAVENOUS

## 2022-01-07 MED ORDER — CHLORHEXIDINE GLUCONATE 0.12 % MT SOLN
15.0000 mL | Freq: Once | OROMUCOSAL | Status: AC
  Administered 2022-01-07: 15 mL via OROMUCOSAL

## 2022-01-07 MED ORDER — IOHEXOL 300 MG/ML  SOLN
INTRAMUSCULAR | Status: DC | PRN
  Administered 2022-01-07: 9 mL

## 2022-01-07 MED ORDER — AMISULPRIDE (ANTIEMETIC) 5 MG/2ML IV SOLN: 10.0000 mg | Freq: Once | INTRAVENOUS | Status: DC | PRN

## 2022-01-07 MED ORDER — ACETAMINOPHEN 500 MG PO TABS
1000.0000 mg | ORAL_TABLET | Freq: Once | ORAL | Status: AC
  Administered 2022-01-07: 1000 mg via ORAL
  Filled 2022-01-07: qty 2

## 2022-01-07 MED ORDER — ROCURONIUM BROMIDE 10 MG/ML (PF) SYRINGE
PREFILLED_SYRINGE | INTRAVENOUS | Status: AC
  Filled 2022-01-07: qty 10

## 2022-01-07 MED ORDER — PHENYLEPHRINE 80 MCG/ML (10ML) SYRINGE FOR IV PUSH (FOR BLOOD PRESSURE SUPPORT)
PREFILLED_SYRINGE | INTRAVENOUS | Status: AC
  Filled 2022-01-07: qty 10

## 2022-01-07 MED ORDER — FENTANYL CITRATE (PF) 100 MCG/2ML IJ SOLN
INTRAMUSCULAR | Status: AC
  Filled 2022-01-07: qty 2

## 2022-01-07 MED ORDER — OXYCODONE-ACETAMINOPHEN 5-325 MG PO TABS: 1.0000 | ORAL_TABLET | ORAL | 0 refills | Status: DC | PRN

## 2022-01-07 MED ORDER — CELECOXIB 200 MG PO CAPS
200.0000 mg | ORAL_CAPSULE | Freq: Once | ORAL | Status: AC
  Administered 2022-01-07: 200 mg via ORAL
  Filled 2022-01-07: qty 1

## 2022-01-07 MED ORDER — SUGAMMADEX SODIUM 500 MG/5ML IV SOLN
INTRAVENOUS | Status: DC | PRN
  Administered 2022-01-07: 400 mg via INTRAVENOUS

## 2022-01-07 MED ORDER — ONDANSETRON HCL 4 MG/2ML IJ SOLN
INTRAMUSCULAR | Status: DC | PRN
  Administered 2022-01-07: 4 mg via INTRAVENOUS

## 2022-01-07 MED ORDER — DEXAMETHASONE SODIUM PHOSPHATE 4 MG/ML IJ SOLN
INTRAMUSCULAR | Status: DC | PRN
  Administered 2022-01-07: 10 mg via INTRAVENOUS

## 2022-01-07 SURGICAL SUPPLY — 13 items
BAG URO CATCHER STRL LF (MISCELLANEOUS) ×2 IMPLANT
CATH URETL OPEN 5X70 (CATHETERS) IMPLANT
CLOTH BEACON ORANGE TIMEOUT ST (SAFETY) ×2 IMPLANT
GLOVE BIOGEL M 7.0 STRL (GLOVE) ×2 IMPLANT
GOWN STRL REUS W/ TWL LRG LVL3 (GOWN DISPOSABLE) ×2 IMPLANT
GOWN STRL REUS W/TWL LRG LVL3 (GOWN DISPOSABLE) ×1
GUIDEWIRE STR DUAL SENSOR (WIRE) ×2 IMPLANT
GUIDEWIRE ZIPWRE .038 STRAIGHT (WIRE) IMPLANT
MANIFOLD NEPTUNE II (INSTRUMENTS) ×2 IMPLANT
PACK CYSTO (CUSTOM PROCEDURE TRAY) ×2 IMPLANT
SYR 10ML LL (SYRINGE) ×2 IMPLANT
TUBING CONNECTING 10 (TUBING) ×2 IMPLANT
TUBING UROLOGY SET (TUBING) IMPLANT

## 2022-01-07 NOTE — Anesthesia Procedure Notes (Signed)
Procedure Name: Intubation Date/Time: 01/07/2022 2:57 PM  Performed by: Lind Covert, CRNAPre-anesthesia Checklist: Patient identified, Emergency Drugs available, Suction available and Patient being monitored Patient Re-evaluated:Patient Re-evaluated prior to induction Oxygen Delivery Method: Circle system utilized Preoxygenation: Pre-oxygenation with 100% oxygen Induction Type: IV induction Ventilation: Mask ventilation without difficulty and Oral airway inserted - appropriate to patient size Laryngoscope Size: Mac and 4 Grade View: Grade I Tube type: Oral Tube size: 7.5 mm Number of attempts: 1 Airway Equipment and Method: Stylet Placement Confirmation: ETT inserted through vocal cords under direct vision, positive ETCO2 and breath sounds checked- equal and bilateral Secured at: 23 cm Dental Injury: Teeth and Oropharynx as per pre-operative assessment

## 2022-01-07 NOTE — Anesthesia Preprocedure Evaluation (Addendum)
Anesthesia Evaluation  Patient identified by MRN, date of birth, ID band Patient awake    Reviewed: Allergy & Precautions, NPO status , Patient's Chart, lab work & pertinent test results  History of Anesthesia Complications Negative for: history of anesthetic complications  Airway Mallampati: I  TM Distance: >3 FB Neck ROM: Full    Dental  (+) Edentulous Lower, Edentulous Upper, Dental Advisory Given   Pulmonary COPD, former smoker   Pulmonary exam normal        Cardiovascular hypertension, Pt. on medications + CAD, + CABG and +CHF  Normal cardiovascular exam+ dysrhythmias (on eliquis) Atrial Fibrillation + Valvular Problems/Murmurs AS   IMPRESSIONS     1. Left ventricular ejection fraction, by estimation, is 55 to 60%. The  left ventricle has normal function. The left ventricle has no regional  wall motion abnormalities. Left ventricular diastolic function could not  be evaluated.   2. Right ventricular systolic function is mildly reduced. The right  ventricular size is normal. There is mildly elevated pulmonary artery  systolic pressure.   3. Left atrial size was severely dilated.   4. Right atrial size was severely dilated.   5. The mitral valve is normal in structure. Trivial mitral valve  regurgitation. No evidence of mitral stenosis.   6. Tricuspid valve regurgitation is moderate.   7. The aortic valve is tricuspid. There is moderate calcification of the  aortic valve. Aortic valve regurgitation is trivial. Mild aortic valve  stenosis.   8. Aortic dilatation noted. There is mild dilatation of the ascending  aorta, measuring 40 mm.   9. The inferior vena cava is normal in size with greater than 50%  respiratory variability, suggesting right atrial pressure of 3 mmHg.      Neuro/Psych negative neurological ROS  negative psych ROS   GI/Hepatic Neg liver ROS, hiatal hernia,,,  Endo/Other  Hypothyroidism   Obesity   Renal/GU Renal disease   hematuria    Musculoskeletal  (+) Arthritis , Osteoarthritis,    Abdominal   Peds negative pediatric ROS (+)  Hematology negative hematology ROS (+)   Anesthesia Other Findings   Reproductive/Obstetrics negative OB ROS                             Anesthesia Physical Anesthesia Plan  ASA: 3  Anesthesia Plan: General   Post-op Pain Management: Celebrex PO (pre-op)* and Tylenol PO (pre-op)*   Induction: Intravenous  PONV Risk Score and Plan: 3 and Ondansetron and Dexamethasone  Airway Management Planned: Oral ETT  Additional Equipment: None  Intra-op Plan:   Post-operative Plan: Extubation in OR  Informed Consent: I have reviewed the patients History and Physical, chart, labs and discussed the procedure including the risks, benefits and alternatives for the proposed anesthesia with the patient or authorized representative who has indicated his/her understanding and acceptance.     Dental advisory given  Plan Discussed with: Anesthesiologist and CRNA  Anesthesia Plan Comments:         Anesthesia Quick Evaluation

## 2022-01-07 NOTE — Op Note (Signed)
Operative Note  Preoperative diagnosis:  1.  High risk nonmuscle invasive bladder cancer (CIS) 2. Metastatic prostate cancer  Postoperative diagnosis: 1.  High risk nonmuscle invasive bladder cancer (CIS) 2. Metastatic prostate cancer  Procedure(s): 1. Bladder biopsy 2. Bilateral retrograde pyelogram  Surgeon: Rexene Alberts, MD  Assistants:  None  Anesthesia:  General  Complications:  None  EBL:  Minimal  Specimens: 1.  ID Type Source Tests Collected by Time Destination  1 : posterior bladder wall Tissue PATH GU tumor resection SURGICAL PATHOLOGY Janith Lima, MD 01/07/2022 1515    Drains/Catheters: 1.  16 French foley catheter  Intraoperative findings:   Subcentimeter area of erythema on posterior bladder wall that was biopsied deeply to level of muscle and then fulgurated. No other suspicious lesions. Excellent hemostasis. No evidence of 3 dimensional mass. No evidence of perforation. Very large prostate with trilobar obstructing lobes. Right retrograde pyelogram with mild narrowing of mid urethra however no obvious filling defect. Left retrograde pyelogram with no evidence of filling defect or hydronephrosis  Indication:  [name] is a 88 y.o. male with history of metastatic prostate cancer with widespread metastatic lymphadenopathy and multifocal bony metastasis who is being treated with ADT plus abiraterone/prednisone.  He also has a history of high risk nonmuscle invasive bladder cancer (CIS).  He was initially diagnosed with low-grade bladder cancer in 2013.  He underwent random bladder biopsies for a positive urine cytology in 06/2021 that revealed CIS of the posterior bladder wall and left bladder wall.  He completed BCG induction in 09/2021.  28-monthcystoscopy in 10/2021 demonstrates no evidence of disease however there was some inflammation that time was consistent with indwelling Foley catheter.  Urine cytology resulted suspicious for high-grade urothelial  carcinoma.  Thus she is being brought to the operating today for cystoscopy, bilateral retrograde pyelograms, bladder biopsies, possible resection of bladder tumor.  All the risks, benefits were discussed with the patient to include but not limited to infection, pain, bleeding, damage to adjacent structures, need for further operations, adverse reaction to anesthesia and death.  Patient understands these risks and agrees to proceed with the operation as planned.    Description of procedure: After informed consent was obtained from the patient, the patient was taken to the operating room. General anesthesia was administered. The patient was placed in dorsal lithotomy position and prepped and draped in usual sterile fashion. Sequential compression devices were applied to lower extremities at the beginning of the case for DVT prophylaxis. Antibiotics were infused prior to surgery start time. A surgical time-out was performed to properly identify the patient, the surgery to be performed, and the surgical site.     We then passed the 21-French rigid cystoscope down the urethra and into the bladder under direct vision without any difficulty. The anterior urethral was normal. The prostate was obstructing. The bladder was inspected with 30 and 70 degree lenses. Once in the bladder, systematic evaluation of bladder revealed a subcentimeter area on posterior bladder wall however no other lesions.. The ureteral orfices were in orthotopic position and not involved. Bilateral retrograde pyelograms were performed by instilling contrast through a 5 french open ended catheter with no evidence of filling defects. He had mild narrowing of his right mid urethra. I took a biopsy of his posterior bladder wall area of erythema with a cold cup biopsy. This was deep into level of muscle.  We then removed the cystoscope and then passed down the 26 French resectoscope sheath down the  urethra into the bladder under direct vision with  the visual obturator and fulgurated this area. There were no other areas so no other areas were biopsied. Hemostasis was achieved using electrocautery. We then proceeded with removing the resectoscope and then placed in a 16 Foley catheter.  The patient tolerated the procedure well with no complication and was awoken from anesthesia and taken to recovery in stable condition.      Plan:  Remove foley in 3 days at home. F/u next week to review pathology.  Matt R. [facility]  Pager: [phone]

## 2022-01-07 NOTE — Progress Notes (Signed)
Patient was informed that his surgeon has been delayed with a previous case.  Unable to give exact time of surgery, estimating it will be another 30 minutes or more.  Patient voiced understanding.  Noted that family was not at the bedside and the waiting room had been called earlier for them to come back.  Informed patient I would call the waiting room again for a family member, but patient declined and said no thank you, they don't have to come back.  Took patient to the bathroom also at this time.  Offered another blanket as well, patient states he is warm enough.

## 2022-01-07 NOTE — Transfer of Care (Signed)
Immediate Anesthesia Transfer of Care Note  Patient: [name]  Procedure(s) Performed: CYSTOSCOPY, BLADDER BIOPSY WITH FULGERATION BILATERAL RETROGRADE PYELOGRAM  Patient Location: PACU  Anesthesia Type:General  Level of Consciousness: sedated  Airway & Oxygen Therapy: Patient Spontanous Breathing and Patient connected to face mask oxygen  Post-op Assessment: Report given to RN and Post -op Vital signs reviewed and stable  Post vital signs: Reviewed and stable  Last Vitals:  Vitals Value Taken Time  BP    Temp    Pulse    Resp    SpO2      Last Pain:  Vitals:   01/07/22 1111  TempSrc:   PainSc: 0-No pain         Complications: No notable events documented.

## 2022-01-07 NOTE — H&P (Signed)
Office Visit Report     12/22/2021   --------------------------------------------------------------------------------    CC/HPI: Treyvon Blahut is a 88 year old male seen in followup today for metastatic prostate cancer and history of HG UCB (CIS).   1. Metastatic prostate cancer (M1b):  -CT hematuria protocol 05/13/2021 demonstrated enlarged prominent pelvic and retroperitoneal lymph nodes which are new since December 16, 2016 which are nonspecific however are suspicious for metastatic nodal involvement.  -S/p IR guided biopsy that resulted metastatic carcinoma positive for PSA consistent with PSA primary.  -Bone scan 06/17/2021: Single focus of increased uptake in the posterior right parietal bone, no corresponding abnormality on previous head CT 02/2013  -CT chest 06/18/2021: Atypical nodularity in right upper lobe, likely inflammation or infection, unusual for metastatic disease or primary, recommended follow-up CT in 3 to 6 months.  -PSA 06/16/2021: 24.8.  -PSMSA PET scan 07/09/2021: Widespread metastasis including hilar lymph node, right upper lobe lung, multifocal nodal metastatic disease including retrocrural, retroperitoneal and pelvic metastasis, multifocal bony metastasis involving calvarium, sixth rib, multiple sites of the spine, right hemipelvis and left sacral ala.  -Treatment: ADT plus abiraterone (reduced patient states that he is only taking 2 tablets abiraterone daily due to side effects and is dose)/prednisone  He was given Firmagon 240 mg on 06/23/2021, 45 mg Eligard 08/02/2021.  -He is following with Dr. Alen Blew who is managing abiraterone/prednisone. As a result had better energy levels.  -Last PSA: 1.37 on 07/29/2021; testosterone was 10.2.  -He denies bone pain unexpected weight loss. He has stable appetite.   #2. 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  -He did undergo surveillance cystoscopy annually with last cystoscopy in 09/2017 by  Dr. Karsten Ro. Cystoscopy 05/25/2021 NED.  He does have a significant smoking history smoking approximately 30 packs/day. He has quit smoking approximate 30 years ago. He denies a family history of bladder cancer.  -CT hematuria protocol 05/13/2021 demonstrated enlarged prominent pelvic and retroperitoneal lymph nodes which are new since December 16, 2016 which are nonspecific however are suspicious for metastatic nodal involvement.  -Cystoscopy 05/25/2021 demonstrated elongated prostatic urethra with friable prostate, some difficult visibility in the bladder however no suspicious papillary lesion.  -Urine cytology 04/2021: Suspicious for high-grade urothelial carcinoma.  -S/p random bladder biopsies 07/23/2021: CIS at the posterior bladder wall and left bladder wall.  -S/p induction BCG completed in 09/2021  -80-monthcystoscopy surveillance 11/18/2021: NED other than inflammation consistent with indwelling Foley catheter  -Urine cytology 11/18/2021 suspicious for high-grade urothelial carcinoma  -He denies gross hematuria.   #3. BPH/LUTS: His IPSS score is 15, quality-of-life 3. He does report sensation of incomplete bladder emptying, frequency, intermittency, weak flow stream and 3 time nocturia.  -CT A/P 05/13/2021 demonstrates 8.4 x 8.1 x 6.8 cm prostate equating to roughly 240 cc prostate.  -He was started on tamsulosin and finasteride in 04/2021 and has seen some improvement.  -He developed urinary retention in [place]. He does state that over the past couple days, this is been poorly draining. After this was enteric in the office, was found to be retracting his prostate. I replaced catheter and he had prompt drainage of 500 mL amber urine. Subsequent cystoscopy demonstrated no clot in the bladder and no residual cancer other than chronic inflammation consistent with Foley catheter.  -He passed void trial 11/18/2021 with PVR only 65 mL.  -He states he is  voiding reasonably well.   4. Erectile  dysfunction: He does report difficulty obtaining maintain erection satisfactory for intercourse. He does use sildenafil 100 mg to some success. He denies taking nitroglycerin.   He does have a significant cardiac history and has had a CABG. he does take Eliquis for history of A-fib.   Patient currently denies fever, chills, sweats, nausea, vomiting, abdominal or flank pain, gross hematuria or dysuria.     ALLERGIES: No Allergies    MEDICATIONS: Finasteride 5 mg tablet 1 tablet PO Daily  Tamsulosin Hcl 0.4 mg capsule 1 capsule PO Q HS  Atorvastatin Calcium 40 mg tablet  Eliquis 5 mg tablet  Levothyroxine Sodium 137 mcg capsule  Ramipril 5 mg capsule     GU PSH: Bladder Instill AntiCA Agent - 10/19/2021, 10/12/2021, 10/04/2021, 09/13/2021, 09/06/2021, 08/30/2021 Cysto Bladder Ureth Biopsy - 07/23/2021 Cystoscopy - 11/18/2021, 05/25/2021, 2019, 2018, 2017 Cystoscopy Fulguration - 07/23/2021 Cystoscopy TURBT <2 cm - 2013 Locm 300-'399Mg'$ /Ml Iodine,1Ml - 06/18/2021, 05/13/2021       PSH Notes: Cystoscopy With Fulguration Small Lesion (5-31m), Endovasc Repair Aort Aneurysm Infrarenal Aorto-Aortic Tube, Hernia Repair, CABG (CABG)   NON-GU PSH: Abdominal Aortic Aneurysm Repair - 2013 CABG (coronary artery bypass grafting) - 2012 Hernia Repair - 2012     GU PMH: Bladder Cancer overlapping sites - 11/18/2021, - 10/19/2021, - 10/12/2021, - 10/04/2021, - 09/20/2021, - 09/13/2021, - 08/30/2021, - 08/02/2021, - 06/23/2021 BPH w/LUTS - 11/18/2021, - 08/02/2021, - 06/23/2021, - 05/25/2021, - 04/28/2021, Benign prostatic hyperplasia with urinary obstruction, - 2015 Prostate Cancer - 11/18/2021, - 08/02/2021, - 06/23/2021 Urinary Retention - 11/18/2021 History of bladder cancer - 09/06/2021, - 05/25/2021, - 04/28/2021 (Stable), He had no evidence of recurrent bladder cancer noted today on cystoscopy. Been 6 years since he was found to have a single, low-grade, superficial tumor resected. I told  him I thought it was safe to go to every other year surveillance cystoscopy from here out., - 2019 (Stable), He has no evidence of recurrent transitional cell carcinoma. He will return again in one year for repeat surveillance cystoscopy., - 2018 (Stable), He had no evidence of recurrent transitional cell carcinoma of the bladder noted on cystoscopy today., - 2017, History of bladder cancer, - 2016 Weak Urinary Stream (Stable) - 08/02/2021, - 06/23/2021, - 05/25/2021, - 04/28/2021 Gross hematuria - 05/25/2021, - 05/13/2021, - 04/28/2021 Nocturia - 04/28/2021 BPH w/o LUTS (Stable), He has a markedly enlarged prostate but is having minimal voiding symptoms. - 2018 Male ED, unspecified, Erectile dysfunction - 2015 Renal cyst, Renal cyst, acquired, left - 2014      PMH Notes: Transitional cell carcinoma the bladder: He experienced gross hematuria and was evaluated with CT scan which revealed no abnormality of the upper tracts. Cystoscopically I found an obvious papillary tumor involving the right trigonal region just proximal to the right ureteral orifice. He underwent a transurethral resection of the lesion in 1/13 and due to its small size the resection resulted in obliteration of the tumor and therefore the pathology revealed no definite TCCa however clinically this was obviously a small, superficial lesion.  CT scan with contrast 3/15: No abnormality of the upper tract.    He does have some mild obstructive symptoms consisting of slowing of his urinary stream as well as nocturia but that's been stable for a number of years.   Bilateral renal cysts: He was noted to have bilateral renal cysts on a CT scan done on 05/03/13. There appeared simple and has remained stable over time.    Organic erectile dysfunction: This  is been managed with Viagra at 50 mg.     NON-GU PMH: Secondary malignant neoplasm of retroperitoneum and peritoneum - 08/02/2021, - 06/18/2021, - 05/25/2021 Metastatic lymphadenopathy of multiple  regions - 06/23/2021 Other pruritus, Jock itch - 2016 Encounter for general adult medical examination without abnormal findings, Encounter for preventive health examination - 2015 Personal history of other diseases of the circulatory system, History of aortic aneurysm - 2014, History of hypertension, - 2014, History of cardiac disorder, - 2014 Personal history of other diseases of the nervous system and sense organs, History of sleep apnea - 2014 Personal history of other endocrine, nutritional and metabolic disease, History of hyperlipidemia - 2014 Unspecified atrial fibrillation, Atrial Fibrillation - 2014 Atrial Fibrillation Hypercholesterolemia Hypertension Sleep Apnea    FAMILY HISTORY: 2 daughters - Runs in Family 1 son - Runs in Family Diabetes - Mother Father Deceased At Age22 ___ - Runs In [place] - Mother, Father Mother Deceased At Age 80 from diabetic complicati - Runs In Family   SOCIAL HISTORY: Marital Status: Widowed Preferred Language: English; Ethnicity: Not Hispanic Or Latino; Race: White Current Smoking Status: Patient does not smoke anymore. Has not smoked since 04/29/1990. Smoked for 30 years.   Tobacco Use Assessment Completed: Used Tobacco in last 30 days? Has never drank.  Drinks 1 caffeinated drink per day. Patient's occupation is/was retired.     Notes: Former smoker, Marital History - Currently Married   REVIEW OF SYSTEMS:    GU Review Male:   Patient denies frequent urination, hard to postpone urination, burning/ pain with urination, get up at night to urinate, leakage of urine, stream starts and stops, trouble starting your stream, have to strain to urinate , erection problems, and penile pain.  Gastrointestinal (Upper):   Patient denies nausea, vomiting, and indigestion/ heartburn.  Gastrointestinal (Lower):   Patient denies diarrhea and constipation.  Constitutional:   Patient denies fever, night sweats, weight loss, and fatigue.  Skin:    Patient denies skin rash/ lesion and itching.  Eyes:   Patient denies blurred vision and double vision.  Ears/ Nose/ Throat:   Patient denies sore throat and sinus problems.  Hematologic/Lymphatic:   Patient denies swollen glands and easy bruising.  Cardiovascular:   Patient denies leg swelling and chest pains.  Respiratory:   Patient denies cough and shortness of breath.  Endocrine:   Patient denies excessive thirst.  Musculoskeletal:   Patient denies back pain and joint pain.  Neurological:   Patient denies headaches and dizziness.  Psychologic:   Patient denies depression and anxiety.   VITAL SIGNS: None   MULTI-SYSTEM PHYSICAL EXAMINATION:    Constitutional: Well-nourished. No physical deformities. Normally developed. Good grooming.  Respiratory: No labored breathing, no use of accessory muscles.   Cardiovascular: Normal temperature, normal extremity pulses, no swelling, no varicosities.  Gastrointestinal: No mass, no tenderness, no rigidity, non obese abdomen.     Complexity of Data:  Source Of History:  Patient, Medical Record Summary  Lab Test Review:   Urine Cytology  Records Review:   Pathology Reports, Previous Doctor Records  Urine Test Review:   Urinalysis   10/25/21 07/29/21 06/16/21 06/05/15 01/25/11  PSA  Total PSA 0.18 ng/mL 1.37 ng/mL 24.80 ng/mL 0.61 ng/dl 0.54     10/25/21 07/29/21  Hormones  Testosterone, Total <10 ng/dL 10.2 ng/dL    PROCEDURES:          Urinalysis w/Scope Dipstick Dipstick Cont'd Micro  Color: Yellow Bilirubin: Neg mg/dL WBC/hpf: NS (Not  Seen)  Appearance: Cloudy Ketones: Neg mg/dL RBC/hpf: 10 - 20/hpf  Specific Gravity: 1.025 Blood: 2+ ery/uL Bacteria: NS (Not Seen)  pH: 6.0 Protein: Neg mg/dL Cystals: Amorph Urates  Glucose: Neg mg/dL Urobilinogen: 0.2 mg/dL Casts: NS (Not Seen)    Nitrites: Neg Trichomonas: Not Present    Leukocyte Esterase: Neg leu/uL Mucous: Not Present      Epithelial Cells: NS (Not Seen)      Yeast: NS (Not  Seen)      Sperm: Not Present    ASSESSMENT:      ICD-10 Details  1 GU:   Bladder Cancer overlapping sites - C67.8   2   BPH w/LUTS - N40.1   3   Prostate Cancer - C61   4   CIS of the bladder - D09.0   5   Weak Urinary Stream - R39.12   6 NON-GU:   Metastatic lymphadenopathy of multiple regions - C77.8    PLAN:           Document Letter(s):  Created for Patient: Clinical Summary         Notes:   1. Castration sensitive metastatic prostate cancer:  -I reviewed PSMA PET scan with widespread lymphadenopathy and bony metastasis.  -Treatment: ADT plus abiraterone (reduced dose)/prednisone  -He was given 45 mg Eligard 08/02/2021. He is due for next ADT in 01/2022.  -Dr. Alen Blew is managing abiraterone/prednisone.  -I did offer Xgeva. He does have dentures in place and has not seen a dentist in many years. At this time, he would like to hold off of Xgeva.  I recommended vitamin D and calcium supplementation.   #2. High risk nonmuscle invasive bladder cancer CIS:  -S/p renal bladder biopsy 06/2021 with evidence of CIS.  -S/p induction BCG completed in 09/2021  -71-monthcystoscopy today 11/18/2021 no evidence of disease however urine cytology on Monday with suspicious for high-grade urothelial carcinoma.  -I recommend cystoscopy, bladder biopsies, bladder retrograde pyelogram. This is scheduled for 01/07/2022.  -Follow this pathology, we discussed that he will likely repeat BCG induction. If persistently positive after this, will consider other intravesical therapies.   #3. BPH is LUTS: Prostate volume by CT 04/2021 is 240 cc. Continue tamsulosin and finasteride.  -He is voiding reasonably well. Continue medical therapy.  -Given large prostate size and advanced age with comorbidities, if he were to develop retention, he would be a good candidate for prostate artery embolization. This was discussed with him today.   4. Erectile dysfunction: Continue sildenafil.   CC: SRenford Dills MD  CC:  FZola Button MD   Urology Preoperative H&P   Chief Complaint: CIS of bladder and metastatic prostate cancer  History of Present Illness: VMERLIN GOLDENis a 88y.o. male with CIS of bladder and metastatic prostate cancer. Denies fevers, chills or gross hematuria. He is here for cystoscopy, bladder biopsy, possible TURBT and bilateral retrograde pyelograms.     Past Medical History:  Diagnosis Date   AAA [place]dominal [place] aneurysm) [place]    s/p stent graft repair 2012   Arthritis    "right ankle" (04/17/2014)   Ascending aorta dilatation ([place]    Echo 2021: 42 mm // Echocardiogram 10/22: EF 55-60, no RWMA, mildly reduced RVSF, RVSP 39.7, severe BAE, trivial MR, mod TR, trivial AI, mild AS (mean 8), ascending aorta 40 mm   Atrial fibrillation (HCC)    a. permanent;  b. Hematuria on Pradaxa;  c. cold intol and chills with Xarelto   Bladder  cancer ([place])    Cardiomyopathy ([place])    a. Echo (03/2013): Inferior and inferolateral AK, EF 40-45%, mild MR, mod LAE, mod RAE, mod TR, mild PI, PASP 46   Chronic kidney disease    COPD (chronic obstructive pulmonary disease) (HCC)    Coronary artery disease s/p cabg x7  1992   a. s/p CABG in 1992 (X 7 - Dr. Redmond Pulling);  b.  Myoview (01/2011): Fixed inferior defect suggestive of prior MI versus diaphragmatic attenuation, apical reversibility possibly suspicious for small area of infarction with peri-infarct ischemia, no significant change from prior study, not gated, low risk study;  c.  Echo (05/2010): Mod LVH, EF 55-65%, mild AI, mild MR, moderate LAE, mild RAE, moderate    Hematuria bladder tumor ---  followed by Dr Karsten Ro   Hiatal hernia    repaired in 1992 w/[facility]   Hx of cardiovascular stress test    Lexiscan Myoview (03/2013):  No ischemia; not gated.   Hyperglycemia diet controlled   Hyperlipidemia    Hypertension    Hypothyroidism    Obesity    Organic impotence    Prostate cancer ([place])    RBBB    S/P AAA repair using bifurcation graft     Sleep apnea no rx cpap   mild- tested  2012   Ventricular ectopy    mild    Past Surgical History:  Procedure Laterality Date   ABDOMINAL AORTIC ANEURYSM REPAIR  02/28/2010   using bifurcation graft   CARDIAC CATHETERIZATION  03/31/1990   Positive   CARDIOVASCULAR STRESS TEST  02-01-2011  NUCLEAR NO EXERCISE   LOW RISK STUDY.  SMALL APICAL DEFECT. NO EVIDENCE OF MULTIZONE ISCHEMIA.   CORONARY ARTERY BYPASS GRAFT  05/30/1990   CABG X 7   CYSTOSCOPY WITH BIOPSY N/A 07/23/2021   Procedure: CYSTOSCOPY WITH  BLADDER BIOPSY AND BILATERAL RETROGRADE PYELOGRAM;  Surgeon: Janith Lima, MD;  Location: [facility];  Service: Urology;  Laterality: N/A;   ENDOVASCULAR STENT INSERTION  02/15/2011   Procedure: ENDOVASCULAR STENT GRAFT INSERTION;  Surgeon: Hinda Lenis, MD;  Location: [facility];  Service: Vascular;  Laterality: N/A;  Insertion of endovascular stent graft    ETT  10/31/2000   WNL   HIATAL HERNIA REPAIR  02/28/1990   "repaired when I had my OHS"   Hospital - Afib  03/28/2002   INGUINAL HERNIA REPAIR Left 03/01/1995   TOTAL ANKLE ARTHROPLASTY Right 04/17/2014   Procedure: RIGHT TOTAL ANKLE ARTHOPLASTY;  Surgeon: Wylene Simmer, MD;  Location: [facility];  Service: Orthopedics;  Laterality: Right;   TRANSURETHRAL RESECTION OF BLADDER TUMOR  03/25/2011   Procedure: TRANSURETHRAL RESECTION OF BLADDER TUMOR (TURBT);  Surgeon: Claybon Jabs, MD;  [facility];  Service: Urology;  Laterality: N/A;    Allergies:  Allergies  Allergen Reactions   Dabigatran Etexilate Mesylate     Blood in urine   Xarelto [Rivaroxaban] Shortness Of Breath and Other (See Comments)    SOB, CHILLS   Metoprolol     Upset stomach    Family History  Problem Relation Age of Onset   Obesity Mother 79       deceased   Hypertension Mother    Diabetes Mother    Heart disease Mother        before age 9   Hyperlipidemia Mother    Alcohol abuse Sister 40       deceased   Other Father  deceased unknown   Colon cancer Neg Hx    Prostate cancer Neg Hx    Anesthesia problems Neg Hx     Social History:  reports that he quit smoking about 31 years ago. His smoking use included cigarettes. He has a 37.50 pack-year smoking history. He has never used smokeless tobacco. He reports that he does not drink alcohol and does not use drugs.  ROS: A complete review of systems was performed.  All systems are negative except for pertinent findings as noted.  Physical Exam:  Vital signs in last 24 hours: Temp:  [97.7 F (36.5 C)] 97.7 F (36.5 C) (11/10 1039) Pulse Rate:  [82] 82 (11/10 1039) Resp:  [18] 18 (11/10 1039) BP: (182)/(94) 182/94 (11/10 1039) SpO2:  [98 %] 98 % (11/10 1039) Weight:  [115.7 kg] 115.7 kg (11/10 1039) Constitutional:  Alert and oriented, No acute distress Cardiovascular: Regular rate and rhythm Respiratory: Normal respiratory effort, Lungs clear bilaterally GI: Abdomen is soft, nontender, nondistended, no abdominal masses GU: No CVA tenderness Lymphatic: No lymphadenopathy Neurologic: Grossly intact, no focal deficits Psychiatric: Normal mood and affect  Laboratory Data:  Recent Labs    01/07/22 1127  WBC 5.7  HGB 13.4  HCT 41.5  PLT 177    No results for input(s): "NA", "K", "CL", "GLUCOSE", "BUN", "CALCIUM", "CREATININE" in the last 72 hours.  Invalid input(s): "CO3"   Results for orders placed or performed during the hospital encounter of 01/07/22 (from the past 24 hour(s))  CBC with Differential/Platelet     Status: None   Collection Time: 01/07/22 11:27 AM  Result Value Ref Range   WBC 5.7 4.0 - 10.5 K/uL   RBC 4.48 4.22 - 5.81 MIL/uL   Hemoglobin 13.4 13.0 - 17.0 g/dL   HCT 41.5 39.0 - 52.0 %   MCV 92.6 80.0 - 100.0 fL   MCH 29.9 26.0 - 34.0 pg   MCHC 32.3 30.0 - 36.0 g/dL   RDW 13.3 11.5 - 15.5 %   Platelets 177 150 - 400 K/uL   nRBC 0.0 0.0 - 0.2 %   Neutrophils Relative % 63 %   Neutro Abs 3.6 1.7 - 7.7 K/uL    Lymphocytes Relative 25 %   Lymphs Abs 1.4 0.7 - 4.0 K/uL   Monocytes Relative 9 %   Monocytes Absolute 0.5 0.1 - 1.0 K/uL   Eosinophils Relative 2 %   Eosinophils Absolute 0.1 0.0 - 0.5 K/uL   Basophils Relative 1 %   Basophils Absolute 0.1 0.0 - 0.1 K/uL   Immature Granulocytes 0 %   Abs Immature Granulocytes 0.02 0.00 - 0.07 K/uL   No results found for this or any previous visit (from the past 240 hour(s)).  Renal Function: No results for input(s): "CREATININE" in the last 168 hours. Estimated Creatinine Clearance: 72.8 mL/min (by C-G formula based on SCr of 0.96 mg/dL).  Radiologic Imaging: No results found.  I independently reviewed the above imaging studies.  Assessment and Plan JERRITT CARDOZA is a 88 y.o. male with CIS of bladder and metastatic prostate cancer here for cystoscopy, bladder biopsy, possible TURBT and bilateral retrograde pyelograms.      Matt R. Camia Dipinto MD 01/07/2022, 2:22 PM  [facility] Pager: [phone]

## 2022-01-07 NOTE — Anesthesia Postprocedure Evaluation (Signed)
Anesthesia Post Note  Patient: Baker Pierini  Procedure(s) Performed: CYSTOSCOPY, BLADDER BIOPSY WITH [place] PYELOGRAM     Patient location during evaluation: PACU Anesthesia Type: General Level of consciousness: awake and alert Pain management: pain level controlled Vital Signs Assessment: post-procedure vital signs reviewed and stable Respiratory status: spontaneous breathing, nonlabored ventilation, respiratory function stable and patient connected to nasal cannula oxygen Cardiovascular status: blood pressure returned to baseline and stable Postop Assessment: no apparent nausea or vomiting Anesthetic complications: no   No notable events documented.  Last Vitals:  Vitals:   01/07/22 1615 01/07/22 1638  BP: (!) 175/115 (!) 196/97  Pulse:    Resp:    Temp: 36.5 C   SpO2: 97%     Last Pain:  Vitals:   01/07/22 1615  TempSrc:   PainSc: 0-No pain                 Effie Berkshire

## 2022-01-08 ENCOUNTER — Encounter (HOSPITAL_COMMUNITY): Payer: Self-pay | Admitting: Urology

## 2022-01-10 LAB — SURGICAL PATHOLOGY

## 2022-01-13 DIAGNOSIS — D090 Carcinoma in situ of bladder: Secondary | ICD-10-CM | POA: Diagnosis not present

## 2022-01-13 DIAGNOSIS — C61 Malignant neoplasm of prostate: Secondary | ICD-10-CM | POA: Diagnosis not present

## 2022-01-13 DIAGNOSIS — C678 Malignant neoplasm of overlapping sites of bladder: Secondary | ICD-10-CM | POA: Diagnosis not present

## 2022-01-13 DIAGNOSIS — R3912 Poor urinary stream: Secondary | ICD-10-CM | POA: Diagnosis not present

## 2022-01-17 ENCOUNTER — Other Ambulatory Visit (HOSPITAL_COMMUNITY): Payer: Self-pay

## 2022-01-18 ENCOUNTER — Inpatient Hospital Stay (HOSPITAL_BASED_OUTPATIENT_CLINIC_OR_DEPARTMENT_OTHER): Payer: Medicare Other | Admitting: Oncology

## 2022-01-18 ENCOUNTER — Inpatient Hospital Stay: Payer: Medicare Other | Attending: Oncology

## 2022-01-18 ENCOUNTER — Other Ambulatory Visit: Payer: Self-pay

## 2022-01-18 VITALS — BP 174/90 | HR 86 | Temp 97.7°F | Resp 16 | Wt 264.9 lb

## 2022-01-18 DIAGNOSIS — Z79899 Other long term (current) drug therapy: Secondary | ICD-10-CM | POA: Insufficient documentation

## 2022-01-18 DIAGNOSIS — C7951 Secondary malignant neoplasm of bone: Secondary | ICD-10-CM | POA: Insufficient documentation

## 2022-01-18 DIAGNOSIS — I10 Essential (primary) hypertension: Secondary | ICD-10-CM | POA: Diagnosis not present

## 2022-01-18 DIAGNOSIS — C679 Malignant neoplasm of bladder, unspecified: Secondary | ICD-10-CM | POA: Insufficient documentation

## 2022-01-18 DIAGNOSIS — Z7952 Long term (current) use of systemic steroids: Secondary | ICD-10-CM | POA: Insufficient documentation

## 2022-01-18 DIAGNOSIS — C61 Malignant neoplasm of prostate: Secondary | ICD-10-CM | POA: Insufficient documentation

## 2022-01-18 DIAGNOSIS — C772 Secondary and unspecified malignant neoplasm of intra-abdominal lymph nodes: Secondary | ICD-10-CM

## 2022-01-18 DIAGNOSIS — Z7901 Long term (current) use of anticoagulants: Secondary | ICD-10-CM | POA: Insufficient documentation

## 2022-01-18 DIAGNOSIS — E2740 Unspecified adrenocortical insufficiency: Secondary | ICD-10-CM | POA: Diagnosis not present

## 2022-01-18 LAB — CBC WITH DIFFERENTIAL (CANCER CENTER ONLY)
Abs Immature Granulocytes: 0.03 10*3/uL (ref 0.00–0.07)
Basophils Absolute: 0.1 10*3/uL (ref 0.0–0.1)
Basophils Relative: 1 %
Eosinophils Absolute: 0.1 10*3/uL (ref 0.0–0.5)
Eosinophils Relative: 2 %
HCT: 40.6 % (ref 39.0–52.0)
Hemoglobin: 13.2 g/dL (ref 13.0–17.0)
Immature Granulocytes: 1 %
Lymphocytes Relative: 22 %
Lymphs Abs: 1.3 10*3/uL (ref 0.7–4.0)
MCH: 29.9 pg (ref 26.0–34.0)
MCHC: 32.5 g/dL (ref 30.0–36.0)
MCV: 92.1 fL (ref 80.0–100.0)
Monocytes Absolute: 0.5 10*3/uL (ref 0.1–1.0)
Monocytes Relative: 8 %
Neutro Abs: 3.8 10*3/uL (ref 1.7–7.7)
Neutrophils Relative %: 66 %
Platelet Count: 185 10*3/uL (ref 150–400)
RBC: 4.41 MIL/uL (ref 4.22–5.81)
RDW: 13.5 % (ref 11.5–15.5)
WBC Count: 5.7 10*3/uL (ref 4.0–10.5)
nRBC: 0 % (ref 0.0–0.2)

## 2022-01-18 LAB — CMP (CANCER CENTER ONLY)
ALT: 8 U/L (ref 0–44)
AST: 10 U/L — ABNORMAL LOW (ref 15–41)
Albumin: 3.9 g/dL (ref 3.5–5.0)
Alkaline Phosphatase: 79 U/L (ref 38–126)
Anion gap: 5 (ref 5–15)
BUN: 12 mg/dL (ref 8–23)
CO2: 30 mmol/L (ref 22–32)
Calcium: 9.4 mg/dL (ref 8.9–10.3)
Chloride: 106 mmol/L (ref 98–111)
Creatinine: 0.88 mg/dL (ref 0.61–1.24)
GFR, Estimated: >60 mL/min (ref >60)
Glucose, Bld: 179 mg/dL — ABNORMAL HIGH (ref 70–99)
Potassium: 4 mmol/L (ref 3.5–5.1)
Sodium: 141 mmol/L (ref 135–145)
Total Bilirubin: 0.8 mg/dL (ref 0.3–1.2)
Total Protein: 6.7 g/dL (ref 6.5–8.1)

## 2022-01-18 NOTE — Progress Notes (Signed)
Hematology and Oncology Follow Up Visit  [name] [id number] [birth year] 88 y.o. 01/18/2022 10:06 AM Tonia Ghent, MDDuncan, [name], MD   Principle Diagnosis: 88 year old with advanced prostate cancer diagnosed in May 2023.  He has castration-sensitive with lymphadenopathy and bone disease, PSA was 24 at the time of diagnosis.  Secondary diagnosis: Superficial bladder tumor diagnosed in 2012.  He developed relapsed disease in May of 2023 with CIS.   Prior Therapy: CT-guided biopsy of the left pelvic lymph nodes on June 02, 2021 showed metastatic carcinoma staining positive for PSA.  Current therapy:  Eligard 45 mg every 6 months started in June 2023.  He is currently receiving that under the care of [facility].  Zytiga 1000 mg with prednisone 5 mg started on August 03, 2021.  His dose was reduced to 500 mg daily dose starting in September 2023.    Interim History: [name] is here for return evaluation.  Since last visit, he reports no major changes in his health.  He denies any complications related to [facility] after reducing the dose.  He denies any excessive fatigue, tiredness or weakness.  He denies any urinary complaints.  He denies any hematuria or dysuria.  Foley catheter has been removed at this time and is urinating freely.  Medications: Reviewed without changes. Current Outpatient Medications  Medication Sig Dispense Refill   abiraterone acetate (ZYTIGA) 250 MG tablet Take 2 tablets (500 mg total) by mouth daily. Take on an empty stomach 1 hour before or 2 hours after a meal 60 tablet 0   atorvastatin (LIPITOR) 40 MG tablet TAKE 1 TABLET BY MOUTH ONCE A DAY 90 tablet 3   docusate sodium (COLACE) 100 MG capsule Take 1 capsule (100 mg total) by mouth daily as needed for up to 30 doses. 30 capsule 0   ELIQUIS 5 MG TABS tablet TAKE 1 TABLET BY MOUTH TWICE (2) DAILY (Patient taking differently: Take 5 mg by mouth daily.) 60 tablet 5   finasteride (PROSCAR) 5 MG  tablet Take 5 mg by mouth daily.     levothyroxine (SYNTHROID) 137 MCG tablet TAKE 1 TABLET BY MOUTH ONCE A DAY ON AN EMPTY STOMACH 90 tablet 3   oxyCODONE-acetaminophen (PERCOCET) 5-325 MG tablet Take 1 tablet by mouth every 4 (four) hours as needed for up to 12 doses for severe pain. 12 tablet 0   predniSONE (DELTASONE) 5 MG tablet TAKE 1 TABLET BY MOUTH ONCE A DAY WITH BREAKFAST. 90 tablet 1   ramipril (ALTACE) 5 MG capsule Take 1 capsule (5 mg total) by mouth 2 (two) times daily. (Patient taking differently: Take 5 mg by mouth daily.) 180 capsule 3   sildenafil (REVATIO) 20 MG tablet TAKE 3 TO 5 TABLETS BY MOUTH DAILY AS NEEDED 50 tablet 12   tamsulosin (FLOMAX) 0.4 MG CAPS capsule Take 0.4 mg by mouth at bedtime.     No current facility-administered medications for this visit.     Allergies:  Allergies  Allergen Reactions   Dabigatran Etexilate Mesylate     Blood in urine   Xarelto [Rivaroxaban] Shortness Of Breath and Other (See Comments)    SOB, CHILLS   Metoprolol     Upset stomach      Physical Exam:  Blood pressure (!) 174/90, pulse 86, temperature 97.7 F (36.5 C), temperature source Temporal, resp. rate 16, weight 264 lb 14.4 oz (120.2 kg), SpO2 98 %.   ECOG: 1    General appearance: Comfortable appearing without any discomfort  Head: Normocephalic without any trauma Oropharynx: Mucous membranes are moist and pink without any thrush or ulcers. Eyes: Pupils are equal and round reactive to light. Lymph nodes: No cervical, supraclavicular, inguinal or axillary lymphadenopathy.   Heart:regular rate and rhythm.  S1 and S2 without leg edema. Lung: Clear without any rhonchi or wheezes.  No dullness to percussion. Abdomin: Soft, nontender, nondistended with good bowel sounds.  No hepatosplenomegaly. Musculoskeletal: No joint deformity or effusion.  Full range of motion noted. Neurological: No deficits noted on motor, sensory and deep tendon reflex exam. Skin: No  petechial rash or dryness.  Appeared moist.       Lab Results: Lab Results  Component Value Date   WBC 5.7 01/07/2022   HGB 13.4 01/07/2022   HCT 41.5 01/07/2022   MCV 92.6 01/07/2022   PLT 177 01/07/2022   PSA 0.61 06/05/2015     Chemistry      Component Value Date/Time   NA 141 12/21/2021 1121   NA 141 09/24/2019 0850   K 3.8 12/21/2021 1121   CL 104 12/21/2021 1121   CO2 29 12/21/2021 1121   BUN 13 12/21/2021 1121   BUN 12 09/24/2019 0850   CREATININE 0.96 12/21/2021 1121   CREATININE 1.03 11/12/2021 1513   CREATININE 0.90 05/03/2013 1032      Component Value Date/Time   CALCIUM 9.4 12/21/2021 1121   ALKPHOS 77 11/12/2021 1513   AST 18 11/12/2021 1513   ALT 13 11/12/2021 1513   BILITOT 0.5 11/12/2021 1513         Latest Reference Range & Units 09/07/21 14:42 11/12/21 15:13  Prostate Specific Ag, Serum 0.0 - 4.0 ng/mL 0.4 0.2    Impression and Plan:   88 year old with:  1.  Castration-sensitive advanced prostate cancer with lymphadenopathy diagnosed in May 2023.    He is currently on Zytiga which she has tolerated without any major complications and excellent PSA response.  Risks and benefits of continuing this treatment were discussed.  Complications that include hypertension, edema and adrenal insufficiency were reiterated.  Alternative treatment options including systemic chemotherapy, PARP inhibitor among others will be considered if his PSA starts to rise again.  He is agreeable to continue at this time at the current dose and schedule.   2.  Androgen deprivation: He will continue to receive Eligard every 6 months under the care of Dr. Abner Greenspan and urology. Will be given in December 2023.   3.  Bone directed therapy: He is to continue calcium and vitamin D supplements.  Delton See will be considered after obtaining dental clearance.  4.  Bladder cancer: He continues to follow with urology regarding this issue.  He has carcinoma in situ on last cystoscopy.  5.   Hypertension: His blood pressure is elevated today and normally is during doctor's visits.  Ambulatory blood pressure monitoring per his report are low are close to normal range.  I have asked him to check his blood pressure periodically and address that with his primary care physician if he is continue to have elevated blood pressure at home.   6.  Prognosis and goals of care: His disease is incurable although aggressive measures are warranted given his reasonable performance status.   7.  Urinary retention: Resolved and his catheter has been removed.  Continues to follow with Dr. Abner Greenspan regarding this issue.  8.  Follow-up: 3 months for repeat evaluation.   30  minutes were dedicated to this encounter.  The time was spent on updating disease  status, treatment choices and outlining future plan of care discussion.       Zola Button, MD 11/21/202310:06 AM

## 2022-01-19 ENCOUNTER — Telehealth: Payer: Self-pay | Admitting: *Deleted

## 2022-01-19 LAB — PROSTATE-SPECIFIC AG, SERUM (LABCORP): Prostate Specific Ag, Serum: 0.1 ng/mL (ref 0.0–4.0)

## 2022-01-19 NOTE — Telephone Encounter (Signed)
-----   Message from Wyatt Portela, MD sent at 01/19/2022  1:53 PM EST ----- Please let him know his PSA is down

## 2022-01-19 NOTE — Telephone Encounter (Signed)
LM with note below 

## 2022-02-04 ENCOUNTER — Other Ambulatory Visit: Payer: Self-pay

## 2022-02-04 ENCOUNTER — Other Ambulatory Visit: Payer: Self-pay | Admitting: Oncology

## 2022-02-04 ENCOUNTER — Other Ambulatory Visit (HOSPITAL_COMMUNITY): Payer: Self-pay

## 2022-02-04 DIAGNOSIS — C61 Malignant neoplasm of prostate: Secondary | ICD-10-CM

## 2022-02-04 MED ORDER — ABIRATERONE ACETATE 250 MG PO TABS
500.0000 mg | ORAL_TABLET | Freq: Every day | ORAL | 0 refills | Status: DC
  Filled 2022-02-04 – 2022-02-09 (×2): qty 60, 30d supply, fill #0

## 2022-02-07 DIAGNOSIS — C61 Malignant neoplasm of prostate: Secondary | ICD-10-CM | POA: Diagnosis not present

## 2022-02-07 DIAGNOSIS — Z5111 Encounter for antineoplastic chemotherapy: Secondary | ICD-10-CM | POA: Diagnosis not present

## 2022-02-09 ENCOUNTER — Other Ambulatory Visit (HOSPITAL_COMMUNITY): Payer: Self-pay

## 2022-02-09 ENCOUNTER — Other Ambulatory Visit: Payer: Self-pay

## 2022-02-11 ENCOUNTER — Other Ambulatory Visit: Payer: Self-pay

## 2022-03-07 ENCOUNTER — Other Ambulatory Visit (HOSPITAL_COMMUNITY): Payer: Self-pay

## 2022-03-07 ENCOUNTER — Other Ambulatory Visit: Payer: Self-pay | Admitting: Family Medicine

## 2022-03-07 ENCOUNTER — Other Ambulatory Visit: Payer: Self-pay

## 2022-03-07 DIAGNOSIS — C772 Secondary and unspecified malignant neoplasm of intra-abdominal lymph nodes: Secondary | ICD-10-CM

## 2022-03-07 MED ORDER — ABIRATERONE ACETATE 250 MG PO TABS
500.0000 mg | ORAL_TABLET | Freq: Every day | ORAL | 0 refills | Status: DC
  Filled 2022-03-07 – 2022-03-15 (×3): qty 60, 30d supply, fill #0

## 2022-03-09 ENCOUNTER — Other Ambulatory Visit (HOSPITAL_COMMUNITY): Payer: Self-pay

## 2022-03-10 DIAGNOSIS — Z5111 Encounter for antineoplastic chemotherapy: Secondary | ICD-10-CM | POA: Diagnosis not present

## 2022-03-10 DIAGNOSIS — C678 Malignant neoplasm of overlapping sites of bladder: Secondary | ICD-10-CM | POA: Diagnosis not present

## 2022-03-14 ENCOUNTER — Other Ambulatory Visit (HOSPITAL_COMMUNITY): Payer: Self-pay

## 2022-03-14 ENCOUNTER — Other Ambulatory Visit: Payer: Self-pay

## 2022-03-15 ENCOUNTER — Other Ambulatory Visit: Payer: Self-pay

## 2022-03-15 ENCOUNTER — Other Ambulatory Visit (HOSPITAL_COMMUNITY): Payer: Self-pay

## 2022-03-17 DIAGNOSIS — C678 Malignant neoplasm of overlapping sites of bladder: Secondary | ICD-10-CM | POA: Diagnosis not present

## 2022-03-17 DIAGNOSIS — Z5111 Encounter for antineoplastic chemotherapy: Secondary | ICD-10-CM | POA: Diagnosis not present

## 2022-03-24 DIAGNOSIS — C678 Malignant neoplasm of overlapping sites of bladder: Secondary | ICD-10-CM | POA: Diagnosis not present

## 2022-03-24 DIAGNOSIS — Z5111 Encounter for antineoplastic chemotherapy: Secondary | ICD-10-CM | POA: Diagnosis not present

## 2022-04-01 ENCOUNTER — Ambulatory Visit
Admission: RE | Admit: 2022-04-01 | Discharge: 2022-04-01 | Disposition: A | Discharge status: Home / Self Care | Payer: Medicare Other | Source: Ambulatory Visit | Attending: Emergency Medicine | Admitting: Emergency Medicine

## 2022-04-01 VITALS — BP 177/74 | HR 87 | Temp 98.0°F | Resp 18

## 2022-04-01 DIAGNOSIS — J069 Acute upper respiratory infection, unspecified: Secondary | ICD-10-CM | POA: Diagnosis not present

## 2022-04-01 MED ORDER — AZITHROMYCIN 250 MG PO TABS: 250.0000 mg | ORAL_TABLET | Freq: Every day | ORAL | 0 refills | Status: DC

## 2022-04-01 NOTE — ED Provider Notes (Signed)
[facility]    CSN: [id number] Arrival date & time: 04/01/22  1146      History   Chief Complaint Chief Complaint  Patient presents with   Nasal Congestion    Probably need antibiotics for sinus infection - Entered by patient    HPI [name] is a 89 y.o. male.   Patient presents for evaluation of nasal congestion, rhinorrhea and a nonproductive cough present for 7 days.  Decreased appetite but tolerating fluids.  No known sick contacts.  Has attempted use of sinus PE and nasal mist which has been effective in managing congestions.  Denies fevers, shortness of breath or wheezing.  Past Medical History:  Diagnosis Date   AAA (abdominal aortic aneurysm) ([place])    s/p stent graft repair 2012   Arthritis    "right ankle" (04/17/2014)   Ascending aorta dilatation ([place])    Echo 2021: 42 mm // Echocardiogram 10/22: EF 55-60, no RWMA, mildly reduced RVSF, RVSP 39.7, severe BAE, trivial MR, mod TR, trivial AI, mild AS (mean 8), ascending aorta 40 mm   Atrial fibrillation (HCC)    a. permanent;  b. Hematuria on Pradaxa;  c. cold intol and chills with Xarelto   Bladder cancer ([place])    Cardiomyopathy ([place])    a. Echo (03/2013): Inferior and inferolateral AK, EF 40-45%, mild MR, mod LAE, mod RAE, mod TR, mild PI, PASP 46   Chronic kidney disease    COPD (chronic obstructive pulmonary disease) (HCC)    Coronary artery disease s/p cabg x7  1992   a. s/p CABG in 1992 (X 7 - Dr. Redmond Pulling);  b.  Myoview (01/2011): Fixed inferior defect suggestive of prior MI versus diaphragmatic attenuation, apical reversibility possibly suspicious for small area of infarction with peri-infarct ischemia, no significant change from prior study, not gated, low risk study;  c.  Echo (05/2010): Mod LVH, EF 55-65%, mild AI, mild MR, moderate LAE, mild RAE, moderate    Hematuria bladder tumor ---  followed by Dr Karsten Ro   Hiatal hernia    repaired in 1992 w/OHS   Hx of cardiovascular stress test     Lexiscan Myoview (03/2013):  No ischemia; not gated.   Hyperglycemia diet controlled   Hyperlipidemia    Hypertension    Hypothyroidism    Obesity    Organic impotence    Prostate cancer ([place])    RBBB    S/P AAA repair using bifurcation graft    Sleep apnea no rx cpap   mild- tested  2012   Ventricular ectopy    mild    Patient Active Problem List   Diagnosis Date Noted   Malignant neoplasm of prostate metastatic to intra-abdominal lymph node ([place]) 06/29/2021   Malignant neoplasm of prostate metastatic to intrapelvic lymph node ([place]) 06/29/2021   Ascending aorta dilatation ([place]) 12/09/2020   Health care maintenance 07/18/2018   Atrial fibrillation ([place])    Lower urinary tract symptoms (LUTS) 38/75/6433   Chronic systolic CHF (congestive heart failure) ([place]) 11/29/2014   Arthritis of right ankle 04/17/2014   Advance care planning 11/24/2013   Cardiomyopathy, ischemic 06/17/2013   AAA (abdominal aortic aneurysm) without rupture ([place]) 11/02/2012   Medicare annual wellness visit, subsequent 12/30/2011   Bladder cancer ([place]) 07/29/2011   Intermittent claudication ([place]) 04/29/2011   Hyperlipidemia 06/13/2010   ORGANIC IMPOTENCE 04/29/2010   Hypothyroidism 06/29/2006   OBESITY 06/29/2006   Essential hypertension 06/29/2006   Coronary artery disease involving native coronary artery of native  heart without angina pectoris 06/29/2006   Hyperglycemia 06/29/2006    Past Surgical History:  Procedure Laterality Date   ABDOMINAL AORTIC ANEURYSM REPAIR  02/28/2010   using bifurcation graft   CARDIAC CATHETERIZATION  03/31/1990   Positive   CARDIOVASCULAR STRESS TEST  02-01-2011  NUCLEAR NO EXERCISE   LOW RISK STUDY.  SMALL APICAL DEFECT. NO EVIDENCE OF MULTIZONE ISCHEMIA.   CORONARY ARTERY BYPASS GRAFT  05/30/1990   CABG X 7   CYSTOSCOPY N/A 01/07/2022   Procedure: CYSTOSCOPY, BLADDER BIOPSY WITH FULGERATION BILATERAL RETROGRADE PYELOGRAM;  Surgeon: Janith Lima, MD;  Location:  [facility];  Service: Urology;  Laterality: N/A;  45 MINUTES NEEDED FOR CASE  INTUBATED PARALYZED   CYSTOSCOPY WITH BIOPSY N/A 07/23/2021   Procedure: CYSTOSCOPY WITH  BLADDER BIOPSY AND BILATERAL RETROGRADE PYELOGRAM;  Surgeon: Janith Lima, MD;  Location: [facility];  Service: Urology;  Laterality: N/A;   ENDOVASCULAR STENT INSERTION  02/15/2011   Procedure: ENDOVASCULAR STENT GRAFT INSERTION;  Surgeon: Hinda Lenis, MD;  Location: [place];  Service: Vascular;  Laterality: N/A;  Insertion of endovascular stent graft    ETT  10/31/2000   WNL   HIATAL HERNIA REPAIR  02/28/1990   "repaired when I had my OHS"   Hospital - Afib  03/28/2002   INGUINAL HERNIA REPAIR Left 03/01/1995   TOTAL ANKLE ARTHROPLASTY Right 04/17/2014   Procedure: RIGHT TOTAL ANKLE ARTHOPLASTY;  Surgeon: Wylene Simmer, MD;  Location: [place];  Service: Orthopedics;  Laterality: Right;   TRANSURETHRAL RESECTION OF BLADDER TUMOR  03/25/2011   Procedure: TRANSURETHRAL RESECTION OF BLADDER TUMOR (TURBT);  Surgeon: Claybon Jabs, MD;  Location: [facility];  Service: Urology;  Laterality: N/A;       Home Medications    Prior to Admission medications   Medication Sig Start Date End Date Taking? Authorizing Provider  abiraterone acetate (ZYTIGA) 250 MG tablet Take 2 tablets (500 mg total) by mouth daily. Take on an empty stomach 1 hour before or 2 hours after a meal 03/07/22  Yes Shadad, Mathis Dad, MD  atorvastatin (LIPITOR) 40 MG tablet TAKE 1 TABLET BY MOUTH ONCE A DAY 03/10/21  Yes Tonia Ghent, MD  azithromycin (ZITHROMAX) 250 MG tablet Take 1 tablet (250 mg total) by mouth daily. Take first 2 tablets together, then 1 every day until finished. 04/01/22  Yes Itsel Opfer R, NP  docusate sodium (COLACE) 100 MG capsule Take 1 capsule (100 mg total) by mouth daily as needed for up to 30 doses. 01/07/22  Yes Janith Lima, MD  ELIQUIS 5 MG TABS tablet TAKE 1 TABLET BY MOUTH TWICE (2) DAILY Patient taking  differently: Take 5 mg by mouth daily. 12/06/21  Yes Sherren Mocha, MD  finasteride (PROSCAR) 5 MG tablet Take 5 mg by mouth daily.   Yes [provider]  levothyroxine (SYNTHROID) 137 MCG tablet TAKE 1 TABLET BY MOUTH ONCE A DAY ON AN EMPTY STOMACH 03/08/22  Yes Tonia Ghent, MD  predniSONE (DELTASONE) 5 MG tablet TAKE 1 TABLET BY MOUTH ONCE A DAY WITH BREAKFAST. 01/02/22  Yes Wyatt Portela, MD  ramipril (ALTACE) 5 MG capsule Take 1 capsule (5 mg total) by mouth 2 (two) times daily. Patient taking differently: Take 5 mg by mouth daily. 06/22/21  Yes Tonia Ghent, MD  tamsulosin (FLOMAX) 0.4 MG CAPS capsule Take 0.4 mg by mouth at bedtime.   Yes [provider]  oxyCODONE-acetaminophen (PERCOCET) 5-325 MG tablet Take 1  tablet by mouth every 4 (four) hours as needed for up to 12 doses for severe pain. 01/07/22   Janith Lima, MD  sildenafil (REVATIO) 20 MG tablet TAKE 3 TO 5 TABLETS BY MOUTH DAILY AS NEEDED 02/06/20   Tonia Ghent, MD    Family History Family History  Problem Relation Age of Onset   Obesity Mother 84       deceased   Hypertension Mother    Diabetes Mother    Heart disease Mother        before age 79   Hyperlipidemia Mother    Alcohol abuse Sister 79       deceased   Other Father        deceased unknown   Colon cancer Neg Hx    Prostate cancer Neg Hx    Anesthesia problems Neg Hx     Social History Social History   Tobacco Use   Smoking status: Former    Packs/day: 1.25    Years: 30.00    Total pack years: 37.50    Types: Cigarettes    Quit date: 02/28/1990    Years since quitting: 32.1   Smokeless tobacco: Never  Vaping Use   Vaping Use: Never used  Substance Use Topics   Alcohol use: No    Alcohol/week: 0.0 standard drinks of alcohol    Comment: basically no alcohol use.  only very rare alcohol use.    Drug use: No     Allergies   Dabigatran etexilate mesylate, Xarelto [rivaroxaban], and Metoprolol   Review of  Systems Review of Systems   Physical Exam Triage Vital Signs ED Triage Vitals  Enc Vitals Group     BP 04/01/22 1226 (!) 177/74     Pulse Rate 04/01/22 1226 87     Resp 04/01/22 1226 18     Temp 04/01/22 1226 98 F (36.7 C)     Temp Source 04/01/22 1226 Oral     SpO2 04/01/22 1226 97 %     Weight --      Height --      Head Circumference --      Peak Flow --      Pain Score 04/01/22 1228 0     Pain Loc --      Pain Edu? --      Excl. in [place]? --    No data found.  Updated Vital Signs BP (!) 177/74 (BP Location: Left Arm)   Pulse 87   Temp 98 F (36.7 C) (Oral)   Resp 18   SpO2 97%   Visual Acuity Right Eye Distance:   Left Eye Distance:   Bilateral Distance:    Right Eye Near:   Left Eye Near:    Bilateral Near:     Physical Exam Constitutional:      Appearance: Normal appearance.  HENT:     Head: Normocephalic.     Right Ear: Tympanic membrane, ear canal and external ear normal.     Left Ear: Tympanic membrane, ear canal and external ear normal.     Nose: Congestion and rhinorrhea present.     Right Sinus: No maxillary sinus tenderness or frontal sinus tenderness.     Left Sinus: No maxillary sinus tenderness or frontal sinus tenderness.     Mouth/Throat:     Mouth: Mucous membranes are moist.     Pharynx: Oropharynx is clear. Posterior oropharyngeal erythema present.     Tonsils: No tonsillar exudate. 0 on  the right. 0 on the left.  Eyes:     Extraocular Movements: Extraocular movements intact.  Cardiovascular:     Rate and Rhythm: Normal rate and regular rhythm.     Pulses: Normal pulses.     Heart sounds: Normal heart sounds.  Pulmonary:     Effort: Pulmonary effort is normal.     Breath sounds: Normal breath sounds.  Skin:    General: Skin is warm and dry.  Neurological:     Mental Status: He is alert and oriented to person, place, and time. Mental status is at baseline.      UC Treatments / Results  Labs (all labs ordered are listed, but  only abnormal results are displayed) Labs Reviewed - No data to display  EKG   Radiology No results found.  Procedures Procedures (including critical care time)  Medications Ordered in UC Medications - No data to display  Initial Impression / Assessment and Plan / UC Course  I have reviewed the triage vital signs and the nursing notes.  Pertinent labs & imaging results that were available during my care of the patient were reviewed by me and considered in my medical decision making (see chart for details).  Acute URI  Vital signs are stable patient is in no signs of distress nontoxic-appearing, will defer viral testing due to timeline of illness, suspicion for pneumonia or bronchitis therefore imaging deferred, azithromycin prescribed additionally to ensure bacteria is not aiding to symptoms, may continue use of over-the-counter medications if deemed effective, may follow-up with his urgent care as needed Final Clinical Impressions(s) / UC Diagnoses   Final diagnoses:  Acute URI     Discharge Instructions      Begin use of azithromycin as directed to provide coverage for bacteria which may be causing her symptoms to prolong  You may continue the over-the-counter medication that you have been using, may also attempt any of the following below    You can take Tylenol and/or Ibuprofen as needed for fever reduction and pain relief.   For cough: honey 1/2 to 1 teaspoon (you can dilute the honey in water or another fluid).  You can also use guaifenesin and dextromethorphan for cough. You can use a humidifier for chest congestion and cough.  If you don't have a humidifier, you can sit in the bathroom with the hot shower running.      For sore throat: try warm salt water gargles, cepacol lozenges, throat spray, warm tea or water with lemon/honey, popsicles or ice, or OTC cold relief medicine for throat discomfort.   For congestion: take a daily anti-histamine like Zyrtec, Claritin,  and a oral decongestant, such as pseudoephedrine.  You can also use Flonase 1-2 sprays in each nostril daily.   It is important to stay hydrated: drink plenty of fluids (water, gatorade/powerade/pedialyte, juices, or teas) to keep your throat moisturized and help further relieve irritation/discomfort.     ED Prescriptions     Medication Sig Dispense Auth. Provider   azithromycin (ZITHROMAX) 250 MG tablet Take 1 tablet (250 mg total) by mouth daily. Take first 2 tablets together, then 1 every day until finished. 6 tablet Hans Eden, NP      PDMP not reviewed this encounter.   Hans Eden, NP 04/01/22 1246

## 2022-04-01 NOTE — ED Triage Notes (Signed)
Nasal congestion, cough that started a week ago. Is taking sinus PE and allergy OTC and nasal spray.

## 2022-04-01 NOTE — Discharge Instructions (Addendum)
Begin use of azithromycin as directed to provide coverage for bacteria which may be causing her symptoms to prolong  You may continue the over-the-counter medication that you have been using, may also attempt any of the following below    You can take Tylenol and/or Ibuprofen as needed for fever reduction and pain relief.   For cough: honey 1/2 to 1 teaspoon (you can dilute the honey in water or another fluid).  You can also use guaifenesin and dextromethorphan for cough. You can use a humidifier for chest congestion and cough.  If you don't have a humidifier, you can sit in the bathroom with the hot shower running.      For sore throat: try warm salt water gargles, cepacol lozenges, throat spray, warm tea or water with lemon/honey, popsicles or ice, or OTC cold relief medicine for throat discomfort.   For congestion: take a daily anti-histamine like Zyrtec, Claritin, and a oral decongestant, such as pseudoephedrine.  You can also use Flonase 1-2 sprays in each nostril daily.   It is important to stay hydrated: drink plenty of fluids (water, gatorade/powerade/pedialyte, juices, or teas) to keep your throat moisturized and help further relieve irritation/discomfort.

## 2022-04-05 ENCOUNTER — Other Ambulatory Visit (HOSPITAL_COMMUNITY): Payer: Self-pay

## 2022-04-05 ENCOUNTER — Other Ambulatory Visit: Payer: Self-pay | Admitting: Oncology

## 2022-04-05 DIAGNOSIS — C772 Secondary and unspecified malignant neoplasm of intra-abdominal lymph nodes: Secondary | ICD-10-CM

## 2022-04-05 MED ORDER — ABIRATERONE ACETATE 250 MG PO TABS
500.0000 mg | ORAL_TABLET | Freq: Every day | ORAL | 0 refills | Status: DC
  Filled 2022-04-05: qty 60, 30d supply, fill #0

## 2022-04-07 ENCOUNTER — Other Ambulatory Visit: Payer: Self-pay

## 2022-04-07 DIAGNOSIS — C61 Malignant neoplasm of prostate: Secondary | ICD-10-CM | POA: Diagnosis not present

## 2022-04-14 DIAGNOSIS — R3912 Poor urinary stream: Secondary | ICD-10-CM | POA: Diagnosis not present

## 2022-04-14 DIAGNOSIS — N401 Enlarged prostate with lower urinary tract symptoms: Secondary | ICD-10-CM | POA: Diagnosis not present

## 2022-04-14 DIAGNOSIS — C678 Malignant neoplasm of overlapping sites of bladder: Secondary | ICD-10-CM | POA: Diagnosis not present

## 2022-04-14 DIAGNOSIS — C61 Malignant neoplasm of prostate: Secondary | ICD-10-CM | POA: Diagnosis not present

## 2022-04-18 ENCOUNTER — Other Ambulatory Visit: Payer: Self-pay

## 2022-04-18 DIAGNOSIS — C61 Malignant neoplasm of prostate: Secondary | ICD-10-CM

## 2022-04-19 ENCOUNTER — Inpatient Hospital Stay: Payer: Medicare Other | Attending: Hematology

## 2022-04-19 ENCOUNTER — Inpatient Hospital Stay (HOSPITAL_BASED_OUTPATIENT_CLINIC_OR_DEPARTMENT_OTHER): Payer: Medicare Other | Admitting: Hematology

## 2022-04-19 ENCOUNTER — Other Ambulatory Visit: Payer: Self-pay

## 2022-04-19 VITALS — BP 165/85 | HR 83 | Temp 97.5°F | Resp 18 | Wt 264.1 lb

## 2022-04-19 DIAGNOSIS — C61 Malignant neoplasm of prostate: Secondary | ICD-10-CM

## 2022-04-19 DIAGNOSIS — Z7901 Long term (current) use of anticoagulants: Secondary | ICD-10-CM | POA: Insufficient documentation

## 2022-04-19 DIAGNOSIS — Z8551 Personal history of malignant neoplasm of bladder: Secondary | ICD-10-CM | POA: Insufficient documentation

## 2022-04-19 DIAGNOSIS — C772 Secondary and unspecified malignant neoplasm of intra-abdominal lymph nodes: Secondary | ICD-10-CM

## 2022-04-19 DIAGNOSIS — C7951 Secondary malignant neoplasm of bone: Secondary | ICD-10-CM | POA: Insufficient documentation

## 2022-04-19 DIAGNOSIS — N189 Chronic kidney disease, unspecified: Secondary | ICD-10-CM | POA: Diagnosis not present

## 2022-04-19 DIAGNOSIS — C775 Secondary and unspecified malignant neoplasm of intrapelvic lymph nodes: Secondary | ICD-10-CM | POA: Diagnosis not present

## 2022-04-19 DIAGNOSIS — R339 Retention of urine, unspecified: Secondary | ICD-10-CM | POA: Insufficient documentation

## 2022-04-19 DIAGNOSIS — I129 Hypertensive chronic kidney disease with stage 1 through stage 4 chronic kidney disease, or unspecified chronic kidney disease: Secondary | ICD-10-CM | POA: Insufficient documentation

## 2022-04-19 DIAGNOSIS — Z79818 Long term (current) use of other agents affecting estrogen receptors and estrogen levels: Secondary | ICD-10-CM | POA: Insufficient documentation

## 2022-04-19 DIAGNOSIS — Z7952 Long term (current) use of systemic steroids: Secondary | ICD-10-CM | POA: Insufficient documentation

## 2022-04-19 DIAGNOSIS — Z79899 Other long term (current) drug therapy: Secondary | ICD-10-CM | POA: Diagnosis not present

## 2022-04-19 DIAGNOSIS — I4891 Unspecified atrial fibrillation: Secondary | ICD-10-CM | POA: Insufficient documentation

## 2022-04-19 LAB — CMP (CANCER CENTER ONLY)
ALT: 10 U/L (ref 0–44)
AST: 13 U/L — ABNORMAL LOW (ref 15–41)
Albumin: 3.9 g/dL (ref 3.5–5.0)
Alkaline Phosphatase: 100 U/L (ref 38–126)
Anion gap: 6 (ref 5–15)
BUN: 11 mg/dL (ref 8–23)
CO2: 29 mmol/L (ref 22–32)
Calcium: 9 mg/dL (ref 8.9–10.3)
Chloride: 106 mmol/L (ref 98–111)
Creatinine: 0.85 mg/dL (ref 0.61–1.24)
GFR, Estimated: >60 mL/min (ref >60)
Glucose, Bld: 111 mg/dL — ABNORMAL HIGH (ref 70–99)
Potassium: 3.2 mmol/L — ABNORMAL LOW (ref 3.5–5.1)
Sodium: 141 mmol/L (ref 135–145)
Total Bilirubin: 0.8 mg/dL (ref 0.3–1.2)
Total Protein: 6.9 g/dL (ref 6.5–8.1)

## 2022-04-19 LAB — CBC WITH DIFFERENTIAL (CANCER CENTER ONLY)
Abs Immature Granulocytes: 0.01 10*3/uL (ref 0.00–0.07)
Basophils Absolute: 0.1 10*3/uL (ref 0.0–0.1)
Basophils Relative: 1 %
Eosinophils Absolute: 0.1 10*3/uL (ref 0.0–0.5)
Eosinophils Relative: 1 %
HCT: 40 % (ref 39.0–52.0)
Hemoglobin: 13.3 g/dL (ref 13.0–17.0)
Immature Granulocytes: 0 %
Lymphocytes Relative: 14 %
Lymphs Abs: 1.1 10*3/uL (ref 0.7–4.0)
MCH: 29.8 pg (ref 26.0–34.0)
MCHC: 33.3 g/dL (ref 30.0–36.0)
MCV: 89.5 fL (ref 80.0–100.0)
Monocytes Absolute: 0.6 10*3/uL (ref 0.1–1.0)
Monocytes Relative: 8 %
Neutro Abs: 5.6 10*3/uL (ref 1.7–7.7)
Neutrophils Relative %: 76 %
Platelet Count: 194 10*3/uL (ref 150–400)
RBC: 4.47 MIL/uL (ref 4.22–5.81)
RDW: 13.4 % (ref 11.5–15.5)
WBC Count: 7.3 10*3/uL (ref 4.0–10.5)
nRBC: 0 % (ref 0.0–0.2)

## 2022-04-19 NOTE — Progress Notes (Signed)
HEMATOLOGY/ONCOLOGY CONSULTATION NOTE  Date of Service: 04/19/2022  Patient Care Team: Tonia Ghent, MD as PCP - General (Family Medicine) Sherren Mocha, MD as PCP - Cardiology (Cardiology) Kathie Rhodes, MD (Inactive) as Attending Physician (Urology) Larey Dresser, MD as Consulting Physician (Cardiology) Sharmon Revere as Physician Assistant (Cardiology) Katheren Puller, RN as Oncology Nurse Navigator  CHIEF COMPLAINTS/PURPOSE OF CONSULTATION:  Malignant neoplasm of prostate metastatic to intra-abdominal lymph node [facility])   Prior Therapy: CT-guided biopsy of the left pelvic lymph nodes on June 02, 2021 showed metastatic carcinoma staining positive for PSA.   Current therapy:   Eligard 45 mg every 6 months started in June 2023.  He is currently receiving that under the care of [facility].   Zytiga 1000 mg with prednisone 5 mg started on August 03, 2021.  His dose was reduced to 500 mg daily dose starting in September 2023.  HISTORY OF PRESENTING ILLNESS:   [name] is a wonderful 89 y.o. male who is here for continued evaluation and management of Malignant neoplasm of prostate metastatic to intra-abdominal lymph node ([place]). Patient was getting followed up by Dr. Alen Blew. Patient is accompanied by his daughter during this visit.   Patient was initially diagnosed with advanced prostate cancer in May 2023. He also had castration-sensitive with lymphadenopathy and bone disease, PSA with 24 at the time of diagnosis. He was also previously diagnosed with superficial bladder tumor in 2012 and developed relapsed disease in May 2023 with CIS.  Patient's current treatment includes Eligard 45 mg every 6 months which was started in June 2023 and Zytiga 500 mg with Prednisone 5 mg. He previously on Zytiga 1000 mg from August 03, 2021 to September 2023. Reduced to 500 mg in September 2023.   Patient was last seen by Dr. Alen Blew on 01/18/2022 and he was doing well overall. He  denied of any toxicities with Zytiga after reducing the dosage. During the last visit, Foley catheter was removed.   He reports he has been doing well overall since his last visit with Dr. Alen Blew. He denies fever, chills, night sweats, new lumps/bumps, urinary problems, abnormal bowel movement, unexpected weight loss, bone pain, abdominal pain, chest pain, back pain. However, he does complain of increased fatigue, occasional bilateral leg swelling, occasional shortness of breath, and occasional constipation. His fatigue is affecting his daily activities, such as getting tired making breakfast. He notes that his fatigue causes bilateral leg weakness.  Patient's daughter notes that the patient recently had sinus infection since the last visit. He has recovered well from the infection. His daughter also reports that the patient has decreased appetite, but his weight has been stable.   He has dentures and no other dental issues.   He is complaint with all of his medications.   Patient's daughter notes that he has a blister on his right feet sole, which is affecting his walking and movement.    MEDICAL HISTORY:  Past Medical History:  Diagnosis Date   AAA (abdominal aortic aneurysm) ([place])    s/p stent graft repair 2012   Arthritis    "right ankle" (04/17/2014)   Ascending aorta dilatation ([place])    Echo 2021: 42 mm // Echocardiogram 10/22: EF 55-60, no RWMA, mildly reduced RVSF, RVSP 39.7, severe BAE, trivial MR, mod TR, trivial AI, mild AS (mean 8), ascending aorta 40 mm   Atrial fibrillation (HCC)    a. permanent;  b. Hematuria on Pradaxa;  c. cold intol and  chills with Xarelto   Bladder cancer ([place])    Cardiomyopathy ([place])    a. Echo (03/2013): Inferior and inferolateral AK, EF 40-45%, mild MR, mod LAE, mod RAE, mod TR, mild PI, PASP 46   Chronic kidney disease    COPD (chronic obstructive pulmonary disease) (HCC)    Coronary artery disease s/p cabg x7  1992   a. s/p CABG in 1992 (X 7 - Dr.  Redmond Pulling);  b.  Myoview (01/2011): Fixed inferior defect suggestive of prior MI versus diaphragmatic attenuation, apical reversibility possibly suspicious for small area of infarction with peri-infarct ischemia, no significant change from prior study, not gated, low risk study;  c.  Echo (05/2010): Mod LVH, EF 55-65%, mild AI, mild MR, moderate LAE, mild RAE, moderate    Hematuria bladder tumor ---  followed by Dr Karsten Ro   Hiatal hernia    repaired in 1992 w/OHS   Hx of cardiovascular stress test    Lexiscan Myoview (03/2013):  No ischemia; not gated.   Hyperglycemia diet controlled   Hyperlipidemia    Hypertension    Hypothyroidism    Obesity    Organic impotence    Prostate cancer ([place])    RBBB    S/P AAA repair using bifurcation graft    Sleep apnea no rx cpap   mild- tested  2012   Ventricular ectopy    mild    SURGICAL HISTORY: Past Surgical History:  Procedure Laterality Date   ABDOMINAL AORTIC ANEURYSM REPAIR  02/28/2010   using bifurcation graft   CARDIAC CATHETERIZATION  03/31/1990   Positive   CARDIOVASCULAR STRESS TEST  02-01-2011  NUCLEAR NO EXERCISE   LOW RISK STUDY.  SMALL APICAL DEFECT. NO EVIDENCE OF MULTIZONE ISCHEMIA.   CORONARY ARTERY BYPASS GRAFT  05/30/1990   CABG X 7   CYSTOSCOPY N/A 01/07/2022   Procedure: CYSTOSCOPY, BLADDER BIOPSY WITH FULGERATION BILATERAL RETROGRADE PYELOGRAM;  Surgeon: Janith Lima, MD;  Location: [facility];  Service: Urology;  Laterality: N/A;  45 MINUTES NEEDED FOR CASE  INTUBATED PARALYZED   CYSTOSCOPY WITH BIOPSY N/A 07/23/2021   Procedure: CYSTOSCOPY WITH  BLADDER BIOPSY AND BILATERAL RETROGRADE PYELOGRAM;  Surgeon: Janith Lima, MD;  Location: [facility];  Service: Urology;  Laterality: N/A;   ENDOVASCULAR STENT INSERTION  02/15/2011   Procedure: ENDOVASCULAR STENT GRAFT INSERTION;  Surgeon: Hinda Lenis, MD;  Location: [place];  Service: Vascular;  Laterality: N/A;  Insertion of endovascular stent graft    ETT  10/31/2000    WNL   HIATAL HERNIA REPAIR  02/28/1990   "repaired when I had my OHS"   Hospital - Afib  03/28/2002   INGUINAL HERNIA REPAIR Left 03/01/1995   TOTAL ANKLE ARTHROPLASTY Right 04/17/2014   Procedure: RIGHT TOTAL ANKLE ARTHOPLASTY;  Surgeon: Wylene Simmer, MD;  Location: [place];  Service: Orthopedics;  Laterality: Right;   TRANSURETHRAL RESECTION OF BLADDER TUMOR  03/25/2011   Procedure: TRANSURETHRAL RESECTION OF BLADDER TUMOR (TURBT);  Surgeon: Claybon Jabs, MD;  [facility];  Service: Urology;  Laterality: N/A;    SOCIAL HISTORY: Social History   Socioeconomic History   Marital status: Widowed    Spouse name: Not on file   Number of children: Not on file   Years of education: Not on file   Highest education level: Not on file  Occupational History   Occupation: retired Pension scheme manager.    Employer: retired    Comment: 2001. Likes to play golf . Travels  with motor home  Tobacco Use   Smoking status: Former    Packs/day: 1.25    Years: 30.00    Total pack years: 37.50    Types: Cigarettes    Quit date: 02/28/1990    Years since quitting: 32.1   Smokeless tobacco: Never  Vaping Use   Vaping Use: Never used  Substance and Sexual Activity   Alcohol use: No    Alcohol/week: 0.0 standard drinks of alcohol    Comment: basically no alcohol use.  only very rare alcohol use.    Drug use: No   Sexual activity: Yes  Other Topics Concern   Not on file  Social History Narrative   From [place]   Lives with girlfriend.     Divorced, remarried then widowed, 3 children (from 1st marriage)   Likes to golf   Prev 4 sport athlete in [place], football fan.    Social Determinants of Health   Financial Resource Strain: Low Risk  (12/09/2021)   Overall Financial Resource Strain (CARDIA)    Difficulty of Paying Living Expenses: Not hard at all  Food Insecurity: No Food Insecurity (12/09/2021)   Hunger Vital Sign    Worried About Running Out of Food in the Last  Year: Never true    Ran Out of Food in the Last Year: Never true  Transportation Needs: No Transportation Needs (12/09/2021)   PRAPARE - Hydrologist (Medical): No    Lack of Transportation (Non-Medical): No  Physical Activity: Inactive (12/09/2021)   Exercise Vital Sign    Days of Exercise per Week: 0 days    Minutes of Exercise per Session: 0 min  Stress: No Stress Concern Present (12/09/2021)   [place]    Feeling of Stress : Not at all  Social Connections: Socially Isolated (12/09/2021)   Social Connection and Isolation Panel [NHANES]    Frequency of Communication with Friends and Family: Three times a week    Frequency of Social Gatherings with Friends and Family: Once a week    Attends Religious Services: Never    Marine scientist or Organizations: No    Attends Archivist Meetings: Never    Marital Status: Widowed  Intimate Partner Violence: Not At Risk (12/09/2021)   Humiliation, Afraid, Rape, and Kick questionnaire    Fear of Current or Ex-Partner: No    Emotionally Abused: No    Physically Abused: No    Sexually Abused: No    FAMILY HISTORY: Family History  Problem Relation Age of Onset   Obesity Mother 79       deceased   Hypertension Mother    Diabetes Mother    Heart disease Mother        before age 82   Hyperlipidemia Mother    Alcohol abuse Sister 67       deceased   Other Father        deceased unknown   Colon cancer Neg Hx    Prostate cancer Neg Hx    Anesthesia problems Neg Hx     ALLERGIES:  is allergic to dabigatran etexilate mesylate, xarelto [rivaroxaban], and metoprolol.  MEDICATIONS:  Current Outpatient Medications  Medication Sig Dispense Refill   abiraterone acetate (ZYTIGA) 250 MG tablet Take 2 tablets (500 mg total) by mouth daily. Take on an empty stomach 1 hour before or 2 hours after a meal 60 tablet 0   atorvastatin  (LIPITOR)  40 MG tablet TAKE 1 TABLET BY MOUTH ONCE A DAY 90 tablet 3   azithromycin (ZITHROMAX) 250 MG tablet Take 1 tablet (250 mg total) by mouth daily. Take first 2 tablets together, then 1 every day until finished. 6 tablet 0   docusate sodium (COLACE) 100 MG capsule Take 1 capsule (100 mg total) by mouth daily as needed for up to 30 doses. 30 capsule 0   ELIQUIS 5 MG TABS tablet TAKE 1 TABLET BY MOUTH TWICE (2) DAILY (Patient taking differently: Take 5 mg by mouth daily.) 60 tablet 5   finasteride (PROSCAR) 5 MG tablet Take 5 mg by mouth daily.     levothyroxine (SYNTHROID) 137 MCG tablet TAKE 1 TABLET BY MOUTH ONCE A DAY ON AN EMPTY STOMACH 90 tablet 0   oxyCODONE-acetaminophen (PERCOCET) 5-325 MG tablet Take 1 tablet by mouth every 4 (four) hours as needed for up to 12 doses for severe pain. 12 tablet 0   predniSONE (DELTASONE) 5 MG tablet TAKE 1 TABLET BY MOUTH ONCE A DAY WITH BREAKFAST. 90 tablet 1   ramipril (ALTACE) 5 MG capsule Take 1 capsule (5 mg total) by mouth 2 (two) times daily. (Patient taking differently: Take 5 mg by mouth daily.) 180 capsule 3   sildenafil (REVATIO) 20 MG tablet TAKE 3 TO 5 TABLETS BY MOUTH DAILY AS NEEDED 50 tablet 12   tamsulosin (FLOMAX) 0.4 MG CAPS capsule Take 0.4 mg by mouth at bedtime.     No current facility-administered medications for this visit.    REVIEW OF SYSTEMS:    10 Point review of Systems was done is negative except as noted above.  PHYSICAL EXAMINATION: ECOG PERFORMANCE STATUS: 2 - Symptomatic, <50% confined to bed  . Vitals:   04/19/22 1304  BP: (!) 165/85  Pulse: 83  Resp: 18  Temp: (!) 97.5 F (36.4 C)  SpO2: 99%   Filed Weights   04/19/22 1304  Weight: 264 lb 1.6 oz (119.8 kg)   .Body mass index is 36.83 kg/m.  GENERAL:alert, in no acute distress and comfortable SKIN: no acute rashes, no significant lesions EYES: conjunctiva are pink and non-injected, sclera anicteric OROPHARYNX: MMM, no exudates, no  oropharyngeal erythema or ulceration NECK: supple, no JVD LYMPH:  no palpable lymphadenopathy in the cervical, axillary or inguinal regions LUNGS: clear to auscultation b/l with normal respiratory effort HEART: regular rate & rhythm ABDOMEN:  normoactive bowel sounds , non tender, not distended. Extremity: no pedal edema PSYCH: alert & oriented x 3 with fluent speech NEURO: no focal motor/sensory deficits  LABORATORY DATA:  I have reviewed the data as listed .    Latest Ref Rng & Units 04/19/2022   12:34 PM 01/18/2022    9:58 AM 01/07/2022   11:27 AM  CBC  WBC 4.0 - 10.5 K/uL 7.3  5.7  5.7   Hemoglobin 13.0 - 17.0 g/dL 13.3  13.2  13.4   Hematocrit 39.0 - 52.0 % 40.0  40.6  41.5   Platelets 150 - 400 K/uL 194  185  177    .    Latest Ref Rng & Units 04/19/2022   12:34 PM 01/18/2022    9:58 AM 12/21/2021   11:21 AM  CMP  Glucose 70 - 99 mg/dL 111  179  121   BUN 8 - 23 mg/dL '11  12  13   '$ Creatinine 0.61 - 1.24 mg/dL 0.85  0.88  0.96   Sodium 135 - 145 mmol/L 141  141  141  Potassium 3.5 - 5.1 mmol/L 3.2  4.0  3.8   Chloride 98 - 111 mmol/L 106  106  104   CO2 22 - 32 mmol/L '29  30  29   '$ Calcium 8.9 - 10.3 mg/dL 9.0  9.4  9.4   Total Protein 6.5 - 8.1 g/dL 6.9  6.7    Total Bilirubin 0.3 - 1.2 mg/dL 0.8  0.8    Alkaline Phos 38 - 126 U/L 100  79    AST 15 - 41 U/L 13  10    ALT 0 - 44 U/L 10  8      RADIOGRAPHIC STUDIES: I have personally reviewed the radiological images as listed and agreed with the findings in the report. No results found.  ASSESSMENT & PLAN:  1.  Castration-sensitive advanced prostate cancer with lymphadenopathy diagnosed in May 2023.    2.  Androgen deprivation: He will continue to receive Eligard every 6 months under the care of Dr. Abner Greenspan and urology. Was last given in December 2023.    3.  Bone directed therapy: He is to continue calcium and vitamin D supplements.  Delton See will be considered after obtaining dental clearance.   4.  Bladder  cancer: He continues to follow with urology regarding this issue.  He has carcinoma in situ on last cystoscopy.  5.  Hypertension: His blood pressure is elevated today and normally is during doctor's visits.  Ambulatory blood pressure monitoring per his report are low are close to normal range.  I have asked him to check his blood pressure periodically and address that with his primary care physician if he is continue to have elevated blood pressure at home.    6.  Urinary retention: Resolved and his catheter has been removed.  Continues to follow with Dr. Abner Greenspan regarding this issue.   PLAN: Patient transferred from Dr Alen Blew..All hpi and oncologic hx reviewed with patient in details -discussed lab results from today, 04/19/2022, with the patient and his daughter. CBC is stable. CMP shows slightly decreased potassium at 3.2. PSA level is still pending.  -recommended to eat more potassium rich food.  -Discussed the option of Xgeva for bone strengthening, Xgeva once a month.  -Patient and his daughter wants to decide on Xgeva during the next visit.  -discussed the option of PT for his fatigue and weakness.  -Patient does not want a referral to PT as of right now.  -Discussed the option of referral to podiatrist regarding blister-like thing on his right feet sole. Patient agrees with this option.  -Referral to Podiatrist.  -Recommended compression socks for leg swelling.   FOLLOW-UP: RTC with Dr Irene Limbo with labs in 3 months  .The total time spent in the appointment was 40 minutes* .  All of the patient's questions were answered with apparent satisfaction. The patient knows to call the clinic with any problems, questions or concerns.   Sullivan Lone MD MS AAHIVMS [facility] Hematology/Oncology Physician [facility]  .*Total Encounter Time as defined by the Centers for Medicare and Medicaid Services includes, in addition to the face-to-face time of a patient visit (documented in the  note above) non-face-to-face time: obtaining and reviewing outside history, ordering and reviewing medications, tests or procedures, care coordination (communications with other health care professionals or caregivers) and documentation in the medical record.   04/19/2022 11:19 AM    I, Cleda Mccreedy, am acting as a Education administrator for Sullivan Lone, MD. .I have reviewed the above documentation for accuracy  and completeness, and I agree with the above. Brunetta Genera MD

## 2022-04-20 ENCOUNTER — Telehealth: Payer: Self-pay | Admitting: Hematology

## 2022-04-20 LAB — PROSTATE-SPECIFIC AG, SERUM (LABCORP): Prostate Specific Ag, Serum: <0.1 ng/mL (ref 0.0–4.0)

## 2022-04-20 NOTE — Telephone Encounter (Signed)
Called patient per 2/20 los notes to schedule f/u. Left voicemail with new appointment information and contact details if needing to reschedule.

## 2022-04-27 ENCOUNTER — Other Ambulatory Visit: Payer: Self-pay

## 2022-04-27 DIAGNOSIS — C61 Malignant neoplasm of prostate: Secondary | ICD-10-CM

## 2022-05-02 ENCOUNTER — Other Ambulatory Visit: Payer: Self-pay | Admitting: Hematology

## 2022-05-02 ENCOUNTER — Other Ambulatory Visit: Payer: Self-pay

## 2022-05-02 DIAGNOSIS — C772 Secondary and unspecified malignant neoplasm of intra-abdominal lymph nodes: Secondary | ICD-10-CM

## 2022-05-02 MED ORDER — ABIRATERONE ACETATE 250 MG PO TABS
500.0000 mg | ORAL_TABLET | Freq: Every day | ORAL | 0 refills | Status: DC
  Filled 2022-05-02: qty 60, 30d supply, fill #0

## 2022-05-04 ENCOUNTER — Ambulatory Visit (INDEPENDENT_AMBULATORY_CARE_PROVIDER_SITE_OTHER): Payer: Medicare Other | Admitting: Podiatry

## 2022-05-04 DIAGNOSIS — M21621 Bunionette of right foot: Secondary | ICD-10-CM | POA: Diagnosis not present

## 2022-05-04 DIAGNOSIS — M216X1 Other acquired deformities of right foot: Secondary | ICD-10-CM | POA: Diagnosis not present

## 2022-05-04 DIAGNOSIS — G622 Polyneuropathy due to other toxic agents: Secondary | ICD-10-CM

## 2022-05-04 DIAGNOSIS — L84 Corns and callosities: Secondary | ICD-10-CM | POA: Diagnosis not present

## 2022-05-04 NOTE — Progress Notes (Signed)
  Subjective:  Patient ID: [name], male    DOB: [birth year],  MRN: [id number]  Chief Complaint  Patient presents with   Callouses    NP- Pre-ulcerative callus on his right foot 5th submet, affecting his walking - patient takes Eliquis    89 y.o. male presents with the above complaint. History confirmed with patient.  He is also undergoing treatment for prostate and bladder cancer  Objective:  Physical Exam: warm, good capillary refill, no trophic changes or ulcerative lesions, normal DP and PT pulses, and he has an abnormal sensory exam with some loss of protective sensation around the distal forefoot he has a preulcerative callus on the right foot that is painful his blood underneath.   Assessment:   1. Callus of foot   2. Tailor's bunion of right foot   3. Plantar flexed metatarsal, right   4. 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  All symptomatic hyperkeratoses were safely debrided with a sterile #15 blade to patient's level of comfort without incident. We discussed preventative and palliative care of these lesions including supportive and accommodative shoegear, padding, prefabricated and custom molded accommodative orthoses, use of a pumice stone and lotions/creams daily.  Recommended urea cream [id number] with 2% salicylic acid.  Multiple dancers pads Nipatra pads dispensed they will use these on [place].  We also discussed the option of a custom molded foot orthosis if this continues to be a problem for him.  Return in about 3 months (around 08/04/2022), or if symptoms worsen or fail to improve, for at risk  foot care.

## 2022-05-04 NOTE — Patient Instructions (Signed)
Look for urea [id number] with 2% salicylic acid cream or ointment and apply to the thickened dry skin / calluses. This can be bought over the counter, at a pharmacy or online such as [employer].   Felt horseshoe pads, dancer's pads, and foam or felt callus pads can be bought on [employer]. Use whichever are most effective for you

## 2022-05-07 ENCOUNTER — Other Ambulatory Visit: Payer: Self-pay | Admitting: Family Medicine

## 2022-05-09 NOTE — Telephone Encounter (Signed)
Patient due for follow up. Please call to schedule appt

## 2022-05-09 NOTE — Telephone Encounter (Signed)
Lvmtcb, sent mychart message  

## 2022-05-11 ENCOUNTER — Other Ambulatory Visit (HOSPITAL_COMMUNITY): Payer: Self-pay

## 2022-05-13 ENCOUNTER — Encounter: Payer: Self-pay | Admitting: Family Medicine

## 2022-05-13 ENCOUNTER — Ambulatory Visit (INDEPENDENT_AMBULATORY_CARE_PROVIDER_SITE_OTHER): Payer: Medicare Other | Admitting: Family Medicine

## 2022-05-13 VITALS — BP 126/84 | HR 94 | Temp 97.4°F | Ht 71.0 in | Wt 261.0 lb

## 2022-05-13 DIAGNOSIS — R739 Hyperglycemia, unspecified: Secondary | ICD-10-CM

## 2022-05-13 DIAGNOSIS — Z7189 Other specified counseling: Secondary | ICD-10-CM

## 2022-05-13 DIAGNOSIS — I10 Essential (primary) hypertension: Secondary | ICD-10-CM | POA: Diagnosis not present

## 2022-05-13 DIAGNOSIS — E039 Hypothyroidism, unspecified: Secondary | ICD-10-CM

## 2022-05-13 DIAGNOSIS — E7800 Pure hypercholesterolemia, unspecified: Secondary | ICD-10-CM

## 2022-05-13 DIAGNOSIS — I714 Abdominal aortic aneurysm, without rupture, unspecified: Secondary | ICD-10-CM | POA: Diagnosis not present

## 2022-05-13 DIAGNOSIS — I4891 Unspecified atrial fibrillation: Secondary | ICD-10-CM | POA: Diagnosis not present

## 2022-05-13 DIAGNOSIS — C61 Malignant neoplasm of prostate: Secondary | ICD-10-CM

## 2022-05-13 DIAGNOSIS — Z0000 Encounter for general adult medical examination without abnormal findings: Secondary | ICD-10-CM

## 2022-05-13 LAB — LIPID PANEL
Cholesterol: 133 mg/dL (ref 0–200)
HDL: 43.4 mg/dL (ref >39.00)
LDL Cholesterol: 62 mg/dL (ref 0–99)
NonHDL: 89.51
Total CHOL/HDL Ratio: 3
Triglycerides: 138 mg/dL (ref 0.0–149.0)
VLDL: 27.6 mg/dL (ref 0.0–40.0)

## 2022-05-13 LAB — HEMOGLOBIN A1C: Hgb A1c MFr Bld: 6.4 % (ref 4.6–6.5)

## 2022-05-13 LAB — TSH: TSH: 0.56 u[IU]/mL (ref 0.35–5.50)

## 2022-05-13 MED ORDER — APIXABAN 5 MG PO TABS: ORAL_TABLET | ORAL | Status: DC

## 2022-05-13 NOTE — Progress Notes (Unsigned)
Elevated Cholesterol: Using medications without problems:yes Muscle aches: no Diet compliance: d/w pt.  Exercise: d/w pt.  Hypertension:    Using medication without problems or lightheadedness: yes Chest pain with exertion:no Edema:some BLE edema at baseline.   Short of breath: mildly, at baseline, no inc in sx.   AF.  He had bruising with BID eliquis, he cut back to QD dosing.  D/w pt.  Not feeling heart racing.  No syncope.    Prostate cancer.  Per onc/urology.   Hypothyroidism.  TSH pending. Compliant.  No dysphagia.    AAA s/p repeat with CT done 2023.  D/w pt about he wasn't enthused about f/u imaging.  I need to check if urology is going to get f/u imaging.    Flu d/w pt.  Shingles 2012 PNA UTD Tetanus 2008 covid 2021 Colonoscopy not due given his age.   PSA per onc/urology.   Advance directive- daughter and son designated if patient were incapacitated.  His daughter passed this year, condolences offered.    Meds, vitals, and allergies reviewed.   PMH and SH reviewed  ROS: Per HPI unless specifically indicated in ROS section   GEN: nad, alert and oriented HEENT: mucous membranes moist NECK: supple w/o LA CV: rrr. PULM: ctab, no inc wob ABD: soft, +bs EXT: no edema SKIN: no acute rash

## 2022-05-13 NOTE — Patient Instructions (Addendum)
Go to the lab on the way out.   If you have mychart we'll likely use that to update you.    Take care.  Glad to see you. Don't change your meds yet.   

## 2022-05-15 ENCOUNTER — Telehealth: Payer: Self-pay | Admitting: Family Medicine

## 2022-05-15 NOTE — Assessment & Plan Note (Signed)
AAA s/p repair with CT done 2023.  D/w pt about the fact that he wasn't enthused about f/u imaging.  I will check if urology is going to get f/u imaging regarding prostate cancer because we may be able to get some data regarding his aorta through that.  See following phone note.

## 2022-05-15 NOTE — Assessment & Plan Note (Signed)
TSH pending. Compliant with levothyroxine, continue as is.  No dysphagia.

## 2022-05-15 NOTE — Assessment & Plan Note (Signed)
History of.  See notes on labs. 

## 2022-05-15 NOTE — Assessment & Plan Note (Signed)
Advance directive- daughter and son designated if patient were incapacitated.

## 2022-05-15 NOTE — Assessment & Plan Note (Signed)
Continue atorvastatin.  See notes on labs. ?

## 2022-05-15 NOTE — Assessment & Plan Note (Signed)
Per urology/oncology.  I will defer.  He agrees.

## 2022-05-15 NOTE — Assessment & Plan Note (Addendum)
AF.  He had bruising with BID eliquis, he cut back to QD dosing.  D/w pt. bruising improved at lower dose.  Not feeling heart racing.  No syncope.  I need input from cardiology about anticoagulation dosing going forward.  Risk of inadequate anticoagulation discussed with patient.

## 2022-05-15 NOTE — Assessment & Plan Note (Signed)
See notes on labs.  Continue ramipril.

## 2022-05-15 NOTE — Telephone Encounter (Signed)
Please call Dr. Gwynneth Aliment office with urology.  I need his input.  AAA s/p repair with CT done 2023.   I need input from urology, ie is he going to get f/u imaging regarding prostate cancer (ie CT abdomen pelvis).  We may be able to get some data regarding his aorta through that.    Either way, please let me know.  Thanks.

## 2022-05-15 NOTE — Assessment & Plan Note (Signed)
Flu d/w pt.  Shingles 2012 PNA UTD Tetanus 2008 covid 2021 Colonoscopy not due given his age.   PSA per onc/urology.   Advance directive- daughter and son designated if patient were incapacitated.

## 2022-05-18 NOTE — Telephone Encounter (Signed)
Message has been given to urology; awaiting response

## 2022-05-22 NOTE — Telephone Encounter (Signed)
I think that if the patient is going to have f/u CT done through your clinic in ~06/2022, then that would be useful and sufficient.  Please send me a copy when it is resulted.  I appreciate your help.

## 2022-06-04 ENCOUNTER — Other Ambulatory Visit: Payer: Self-pay | Admitting: Family Medicine

## 2022-06-08 ENCOUNTER — Other Ambulatory Visit: Payer: Self-pay

## 2022-06-08 ENCOUNTER — Other Ambulatory Visit: Payer: Self-pay | Admitting: Hematology

## 2022-06-08 ENCOUNTER — Other Ambulatory Visit (HOSPITAL_COMMUNITY): Payer: Self-pay

## 2022-06-08 DIAGNOSIS — C61 Malignant neoplasm of prostate: Secondary | ICD-10-CM

## 2022-06-08 MED ORDER — ABIRATERONE ACETATE 250 MG PO TABS
500.0000 mg | ORAL_TABLET | Freq: Every day | ORAL | 0 refills | Status: DC
  Filled 2022-06-08: qty 60, 30d supply, fill #0

## 2022-06-20 DIAGNOSIS — Z5111 Encounter for antineoplastic chemotherapy: Secondary | ICD-10-CM | POA: Diagnosis not present

## 2022-06-20 DIAGNOSIS — C678 Malignant neoplasm of overlapping sites of bladder: Secondary | ICD-10-CM | POA: Diagnosis not present

## 2022-06-27 DIAGNOSIS — C678 Malignant neoplasm of overlapping sites of bladder: Secondary | ICD-10-CM | POA: Diagnosis not present

## 2022-06-27 DIAGNOSIS — Z5111 Encounter for antineoplastic chemotherapy: Secondary | ICD-10-CM | POA: Diagnosis not present

## 2022-06-27 DIAGNOSIS — N401 Enlarged prostate with lower urinary tract symptoms: Secondary | ICD-10-CM | POA: Diagnosis not present

## 2022-06-28 ENCOUNTER — Other Ambulatory Visit: Payer: Self-pay

## 2022-06-28 DIAGNOSIS — C61 Malignant neoplasm of prostate: Secondary | ICD-10-CM

## 2022-07-01 DIAGNOSIS — C679 Malignant neoplasm of bladder, unspecified: Secondary | ICD-10-CM | POA: Diagnosis not present

## 2022-07-01 DIAGNOSIS — C61 Malignant neoplasm of prostate: Secondary | ICD-10-CM | POA: Diagnosis not present

## 2022-07-01 DIAGNOSIS — N281 Cyst of kidney, acquired: Secondary | ICD-10-CM | POA: Diagnosis not present

## 2022-07-04 ENCOUNTER — Other Ambulatory Visit (HOSPITAL_COMMUNITY): Payer: Self-pay

## 2022-07-04 DIAGNOSIS — Z5111 Encounter for antineoplastic chemotherapy: Secondary | ICD-10-CM | POA: Diagnosis not present

## 2022-07-04 DIAGNOSIS — C678 Malignant neoplasm of overlapping sites of bladder: Secondary | ICD-10-CM | POA: Diagnosis not present

## 2022-07-05 ENCOUNTER — Other Ambulatory Visit (HOSPITAL_COMMUNITY): Payer: Self-pay

## 2022-07-05 ENCOUNTER — Other Ambulatory Visit: Payer: Self-pay | Admitting: Hematology

## 2022-07-05 DIAGNOSIS — C772 Secondary and unspecified malignant neoplasm of intra-abdominal lymph nodes: Secondary | ICD-10-CM

## 2022-07-05 MED ORDER — ABIRATERONE ACETATE 250 MG PO TABS
500.0000 mg | ORAL_TABLET | Freq: Every day | ORAL | 0 refills | Status: DC
  Filled 2022-07-05 – 2022-07-12 (×2): qty 60, 30d supply, fill #0

## 2022-07-11 ENCOUNTER — Other Ambulatory Visit (HOSPITAL_COMMUNITY): Payer: Self-pay

## 2022-07-12 ENCOUNTER — Other Ambulatory Visit (HOSPITAL_COMMUNITY): Payer: Self-pay

## 2022-07-12 ENCOUNTER — Other Ambulatory Visit: Payer: Self-pay

## 2022-07-13 DIAGNOSIS — C61 Malignant neoplasm of prostate: Secondary | ICD-10-CM | POA: Diagnosis not present

## 2022-07-13 DIAGNOSIS — R3912 Poor urinary stream: Secondary | ICD-10-CM | POA: Diagnosis not present

## 2022-07-13 DIAGNOSIS — N401 Enlarged prostate with lower urinary tract symptoms: Secondary | ICD-10-CM | POA: Diagnosis not present

## 2022-07-14 ENCOUNTER — Telehealth: Payer: Self-pay | Admitting: Pharmacy Technician

## 2022-07-14 ENCOUNTER — Other Ambulatory Visit (HOSPITAL_COMMUNITY): Payer: Self-pay

## 2022-07-14 NOTE — Telephone Encounter (Signed)
Oral Oncology Patient Advocate Encounter  Prior Authorization for abiraterone has been approved.    PA# [id number] Effective dates: 07/14/22 through 07/14/23  Patient may continue to fill at [facility].    Jinger Neighbors, CPhT-Adv Oncology Pharmacy Patient Advocate [facility] Direct Number: [phone]  Fax: [phone]

## 2022-07-14 NOTE — Telephone Encounter (Signed)
Oral Oncology Patient Advocate Encounter   Received notification that prior authorization for abiraterone is due for renewal.   PA submitted on 07/14/22 Key [id number] Status is pending     Jinger Neighbors, CPhT-Adv Oncology Pharmacy Patient Advocate [facility] Direct Number: [phone]  Fax: [phone]

## 2022-07-18 ENCOUNTER — Other Ambulatory Visit: Payer: Self-pay

## 2022-07-18 DIAGNOSIS — C61 Malignant neoplasm of prostate: Secondary | ICD-10-CM

## 2022-07-18 NOTE — Progress Notes (Unsigned)
Palliative Medicine [facility]  [phone] Fax:([phone]) [phone]   Name: [name] Date: 07/18/2022 MRN: [id number]  DOB: 1933-11-27  Patient Care Team: Joaquim Nam, MD as PCP - General (Family Medicine) Tonny Bollman, MD as PCP - Cardiology (Cardiology) Ihor Gully, MD (Inactive) as Attending Physician (Urology) Laurey Morale, MD as Consulting Physician (Cardiology) Kennon Rounds as Physician Assistant (Cardiology) Cherlyn Cushing, RN as Oncology Nurse Navigator    REASON FOR CONSULTATION: [name] is a 89 y.o. male with oncologic medical history including malignant neoplasm of prostate (06/2022) metastatic disease to intra-abdominal lymph node. Previous history of bladder cancer (06/2011), abdominal aortic aneurysm without rupture, CHF, A-Fib, and HLD. Palliative ask to see for symptom management and goals of care.    SOCIAL HISTORY:     reports that he quit smoking about 32 years ago. His smoking use included cigarettes. He has a 37.50 pack-year smoking history. He has never used smokeless tobacco. He reports that he does not drink alcohol and does not use drugs.  ADVANCE DIRECTIVES:  None on file  CODE STATUS: Full code  PAST MEDICAL HISTORY: Past Medical History:  Diagnosis Date   AAA (abdominal aortic aneurysm) (HCC)    s/p stent graft repair 2012   Arthritis    "right ankle" (04/17/2014)   Ascending aorta dilatation (HCC)    Echo 2021: 42 mm // Echocardiogram 10/22: EF 55-60, no RWMA, mildly reduced RVSF, RVSP 39.7, severe BAE, trivial MR, mod TR, trivial AI, mild AS (mean 8), ascending aorta 40 mm   Atrial fibrillation (HCC)    a. permanent;  b. Hematuria on Pradaxa;  c. cold intol and chills with Xarelto   Bladder cancer (HCC)    Cardiomyopathy (HCC)    a. Echo (03/2013): Inferior and inferolateral AK, EF 40-45%, mild MR, mod LAE, mod RAE, mod TR, mild PI, PASP 46   Chronic kidney disease    COPD (chronic  obstructive pulmonary disease) (HCC)    Coronary artery disease s/p cabg x7  1992   a. s/p CABG in 1992 (X 7 - Dr. Andrey Campanile);  b.  Myoview (01/2011): Fixed inferior defect suggestive of prior MI versus diaphragmatic attenuation, apical reversibility possibly suspicious for small area of infarction with peri-infarct ischemia, no significant change from prior study, not gated, low risk study;  c.  Echo (05/2010): Mod LVH, EF 55-65%, mild AI, mild MR, moderate LAE, mild RAE, moderate    Hematuria bladder tumor ---  followed by Dr Vernie Ammons   Hiatal hernia    repaired in 1992 w/[facility]   Hx of cardiovascular stress test    Lexiscan Myoview (03/2013):  No ischemia; not gated.   Hyperglycemia diet controlled   Hyperlipidemia    Hypertension    Hypothyroidism    Obesity    Organic impotence    Prostate cancer (HCC)    RBBB    S/P AAA repair using bifurcation graft    Sleep apnea no rx cpap   mild- tested  2012   Ventricular ectopy    mild    PAST SURGICAL HISTORY:  Past Surgical History:  Procedure Laterality Date   ABDOMINAL AORTIC ANEURYSM REPAIR  02/28/2010   using bifurcation graft   CARDIAC CATHETERIZATION  03/31/1990   Positive   CARDIOVASCULAR STRESS TEST  02-01-2011  NUCLEAR NO EXERCISE   LOW RISK STUDY.  SMALL APICAL DEFECT. NO EVIDENCE OF MULTIZONE ISCHEMIA.   CORONARY ARTERY BYPASS GRAFT  05/30/1990  CABG X 7   CYSTOSCOPY N/A 01/07/2022   Procedure: CYSTOSCOPY, BLADDER BIOPSY WITH FULGERATION BILATERAL RETROGRADE PYELOGRAM;  Surgeon: [name], MD;  Location: [facility];  Service: Urology;  Laterality: N/A;  45 MINUTES NEEDED FOR CASE  INTUBATED PARALYZED   CYSTOSCOPY WITH BIOPSY N/A 07/23/2021   Procedure: CYSTOSCOPY WITH  BLADDER BIOPSY AND BILATERAL RETROGRADE PYELOGRAM;  Surgeon: [name], MD;  Location: [facility];  Service: Urology;  Laterality: N/A;   ENDOVASCULAR STENT INSERTION  02/15/2011   Procedure: ENDOVASCULAR STENT GRAFT INSERTION;  Surgeon: Nilda Simmer,  MD;  Location: [facility] OR;  Service: Vascular;  Laterality: N/A;  Insertion of endovascular stent graft    ETT  10/31/2000   WNL   HIATAL HERNIA REPAIR  02/28/1990   "repaired when I had my [facility]"   Hospital - Afib  03/28/2002   INGUINAL HERNIA REPAIR Left 03/01/1995   TOTAL ANKLE ARTHROPLASTY Right 04/17/2014   Procedure: RIGHT TOTAL ANKLE ARTHOPLASTY;  Surgeon: Toni Arthurs, MD;  Location: [facility] OR;  Service: Orthopedics;  Laterality: Right;   TRANSURETHRAL RESECTION OF BLADDER TUMOR  03/25/2011   Procedure: TRANSURETHRAL RESECTION OF BLADDER TUMOR (TURBT);  Surgeon: Garnett Farm, MD;  Location: [facility];  Service: Urology;  Laterality: N/A;    HEMATOLOGY/ONCOLOGY HISTORY:  Oncology History  Malignant neoplasm of prostate metastatic to intrapelvic lymph node (HCC)  06/02/2021 Cancer Staging   Staging form: Prostate, AJCC 8th Edition - Clinical stage from 06/02/2021: Stage IVB (cN1, cM1b, PSA: 24.8) - Signed by Marcello Fennel, PA-C on 07/09/2021 Histopathologic type: Adenocarcinoma, NOS Stage prefix: Initial diagnosis Prostate specific antigen (PSA) range: 20 or greater   06/29/2021 Initial Diagnosis   Malignant neoplasm of prostate metastatic to intrapelvic lymph node (HCC)     ALLERGIES:  is allergic to dabigatran etexilate mesylate, xarelto [rivaroxaban], and metoprolol.  MEDICATIONS:  Current Outpatient Medications  Medication Sig Dispense Refill   abiraterone acetate (ZYTIGA) 250 MG tablet Take 2 tablets (500 mg total) by mouth daily. Take on an empty stomach 1 hour before or 2 hours after a meal 60 tablet 0   apixaban (ELIQUIS) 5 MG TABS tablet TAKE 1 TABLET BY MOUTH DAILY     atorvastatin (LIPITOR) 40 MG tablet TAKE ONE TABLET BY MOUTH ONCE A DAY 90 tablet 0   docusate sodium (COLACE) 100 MG capsule Take 1 capsule (100 mg total) by mouth daily as needed for up to 30 doses. 30 capsule 0   finasteride (PROSCAR) 5 MG tablet Take 5 mg by mouth daily.     levothyroxine  (SYNTHROID) 137 MCG tablet TAKE ONE TABLET BY MOUTH ONCE A DAY ON AN EMPTY STOMACH 90 tablet 2   predniSONE (DELTASONE) 5 MG tablet TAKE 1 TABLET BY MOUTH ONCE A DAY WITH BREAKFAST. 90 tablet 1   ramipril (ALTACE) 5 MG capsule TAKE ONE CAPSULE BY MOUTH TWO TIMES DAILY 180 capsule 0   sildenafil (REVATIO) 20 MG tablet TAKE 3 TO 5 TABLETS BY MOUTH DAILY AS NEEDED 50 tablet 12   tamsulosin (FLOMAX) 0.4 MG CAPS capsule Take 0.4 mg by mouth at bedtime.     No current facility-administered medications for this visit.    VITAL SIGNS: There were no vitals taken for this visit. There were no vitals filed for this visit.  Estimated body mass index is 36.4 kg/m as calculated from the following:   Height as of 05/13/22: 5\' 11"  (1.803 m).   Weight as of 05/13/22: 261 lb (118.4 kg).  LABS: CBC:    Component Value Date/Time   WBC 7.3 04/19/2022 1234   WBC 5.7 01/07/2022 1127   HGB 13.3 04/19/2022 1234   HGB 16.1 09/24/2019 0850   HCT 40.0 04/19/2022 1234   HCT 49.3 09/24/2019 0850   PLT 194 04/19/2022 1234   PLT 231 09/24/2019 0850   MCV 89.5 04/19/2022 1234   MCV 91 09/24/2019 0850   NEUTROABS 5.6 04/19/2022 1234   LYMPHSABS 1.1 04/19/2022 1234   MONOABS 0.6 04/19/2022 1234   EOSABS 0.1 04/19/2022 1234   BASOSABS 0.1 04/19/2022 1234   Comprehensive Metabolic Panel:    Component Value Date/Time   NA 141 04/19/2022 1234   NA 141 09/24/2019 0850   K 3.2 (L) 04/19/2022 1234   CL 106 04/19/2022 1234   CO2 29 04/19/2022 1234   BUN 11 04/19/2022 1234   BUN 12 09/24/2019 0850   CREATININE 0.85 04/19/2022 1234   CREATININE 0.90 05/03/2013 1032   GLUCOSE 111 (H) 04/19/2022 1234   CALCIUM 9.0 04/19/2022 1234   AST 13 (L) 04/19/2022 1234   ALT 10 04/19/2022 1234   ALKPHOS 100 04/19/2022 1234   BILITOT 0.8 04/19/2022 1234   PROT 6.9 04/19/2022 1234   PROT 7.5 09/24/2019 0850   ALBUMIN 3.9 04/19/2022 1234   ALBUMIN 4.5 09/24/2019 0850    RADIOGRAPHIC STUDIES: No results  found.  PERFORMANCE STATUS (ECOG) : {CHL ONC ECOG ZO:[id number]}  Review of Systems Unless otherwise noted, a complete review of systems is negative.  Physical Exam General: NAD Cardiovascular: regular rate and rhythm Pulmonary: clear ant fields Abdomen: soft, nontender, + bowel sounds Extremities: no edema, no joint deformities Skin: no rashes Neurological:  IMPRESSION: *** I introduced myself, Maygan RN, and Palliative's role in collaboration with the oncology team. Concept of Palliative Care was introduced as specialized medical care for people and their families living with serious illness.  It focuses on providing relief from the symptoms and stress of a serious illness.  The goal is to improve quality of life for both the patient and the family. Values and goals of care important to patient and family were attempted to be elicited.    We discussed *** current illness and what it means in the larger context of *** on-going co-morbidities. Natural disease trajectory and expectations were discussed.  I discussed the importance of continued conversation with family and their medical providers regarding overall plan of care and treatment options, ensuring decisions are within the context of the patients values and GOCs.  PLAN: Established therapeutic relationship. Education provided on palliative's role in collaboration with their Oncology/Radiation team. I will plan to see patient back in 2-4 weeks in collaboration to other oncology appointments.    Patient expressed understanding and was in agreement with this plan. He also understands that He can call the clinic at any time with any questions, concerns, or complaints.   Thank you for your referral and allowing Palliative to assist in Mr. Ronnal Stoops Escher's care.   Number and complexity of problems addressed: ***HIGH - 1 or more chronic illnesses with SEVERE exacerbation, progression, or side effects of treatment - advanced  cancer, pain. Any controlled substances utilized were prescribed in the context of palliative care.   Visit consisted of counseling and education dealing with the complex and emotionally intense issues of symptom management and palliative care in the setting of serious and potentially life-threatening illness.Greater than 50%  of this time was spent counseling and coordinating care related to the above  assessment and plan.  Signed by: Willette Alma, AGPCNP-BC Palliative Medicine Team/[facility]   *Please note that this is a verbal dictation therefore any spelling or grammatical errors are due to the "[employer]" system interpretation.

## 2022-07-19 ENCOUNTER — Other Ambulatory Visit: Payer: Self-pay

## 2022-07-19 ENCOUNTER — Encounter: Payer: Self-pay | Admitting: Nurse Practitioner

## 2022-07-19 ENCOUNTER — Inpatient Hospital Stay (HOSPITAL_BASED_OUTPATIENT_CLINIC_OR_DEPARTMENT_OTHER): Payer: Medicare Other | Admitting: Nurse Practitioner

## 2022-07-19 ENCOUNTER — Inpatient Hospital Stay: Payer: Medicare Other | Attending: Hematology

## 2022-07-19 ENCOUNTER — Inpatient Hospital Stay (HOSPITAL_BASED_OUTPATIENT_CLINIC_OR_DEPARTMENT_OTHER): Payer: Medicare Other | Admitting: Hematology

## 2022-07-19 VITALS — BP 175/86 | HR 83 | Temp 97.8°F | Resp 18 | Ht 71.0 in | Wt 266.7 lb

## 2022-07-19 DIAGNOSIS — C61 Malignant neoplasm of prostate: Secondary | ICD-10-CM

## 2022-07-19 DIAGNOSIS — C775 Secondary and unspecified malignant neoplasm of intrapelvic lymph nodes: Secondary | ICD-10-CM | POA: Insufficient documentation

## 2022-07-19 DIAGNOSIS — C7951 Secondary malignant neoplasm of bone: Secondary | ICD-10-CM | POA: Diagnosis not present

## 2022-07-19 DIAGNOSIS — Z7189 Other specified counseling: Secondary | ICD-10-CM

## 2022-07-19 DIAGNOSIS — Z79899 Other long term (current) drug therapy: Secondary | ICD-10-CM | POA: Diagnosis not present

## 2022-07-19 DIAGNOSIS — Z515 Encounter for palliative care: Secondary | ICD-10-CM | POA: Diagnosis not present

## 2022-07-19 DIAGNOSIS — R530 Neoplastic (malignant) related fatigue: Secondary | ICD-10-CM | POA: Diagnosis not present

## 2022-07-19 DIAGNOSIS — C772 Secondary and unspecified malignant neoplasm of intra-abdominal lymph nodes: Secondary | ICD-10-CM

## 2022-07-19 LAB — CBC WITH DIFFERENTIAL (CANCER CENTER ONLY)
Abs Immature Granulocytes: 0.03 10*3/uL (ref 0.00–0.07)
Basophils Absolute: 0.1 10*3/uL (ref 0.0–0.1)
Basophils Relative: 1 %
Eosinophils Absolute: 0.1 10*3/uL (ref 0.0–0.5)
Eosinophils Relative: 2 %
HCT: 41.2 % (ref 39.0–52.0)
Hemoglobin: 13.3 g/dL (ref 13.0–17.0)
Immature Granulocytes: 0 %
Lymphocytes Relative: 16 %
Lymphs Abs: 1.1 10*3/uL (ref 0.7–4.0)
MCH: 29.8 pg (ref 26.0–34.0)
MCHC: 32.3 g/dL (ref 30.0–36.0)
MCV: 92.4 fL (ref 80.0–100.0)
Monocytes Absolute: 0.5 10*3/uL (ref 0.1–1.0)
Monocytes Relative: 7 %
Neutro Abs: 4.9 10*3/uL (ref 1.7–7.7)
Neutrophils Relative %: 74 %
Platelet Count: 174 10*3/uL (ref 150–400)
RBC: 4.46 MIL/uL (ref 4.22–5.81)
RDW: 14.2 % (ref 11.5–15.5)
WBC Count: 6.7 10*3/uL (ref 4.0–10.5)
nRBC: 0 % (ref 0.0–0.2)

## 2022-07-19 LAB — CMP (CANCER CENTER ONLY)
ALT: 9 U/L (ref 0–44)
AST: 12 U/L — ABNORMAL LOW (ref 15–41)
Albumin: 3.8 g/dL (ref 3.5–5.0)
Alkaline Phosphatase: 96 U/L (ref 38–126)
Anion gap: 6 (ref 5–15)
BUN: 10 mg/dL (ref 8–23)
CO2: 29 mmol/L (ref 22–32)
Calcium: 9 mg/dL (ref 8.9–10.3)
Chloride: 105 mmol/L (ref 98–111)
Creatinine: 1 mg/dL (ref 0.61–1.24)
GFR, Estimated: >60 mL/min (ref >60)
Glucose, Bld: 191 mg/dL — ABNORMAL HIGH (ref 70–99)
Potassium: 3.9 mmol/L (ref 3.5–5.1)
Sodium: 140 mmol/L (ref 135–145)
Total Bilirubin: 0.9 mg/dL (ref 0.3–1.2)
Total Protein: 6.7 g/dL (ref 6.5–8.1)

## 2022-07-19 NOTE — Progress Notes (Signed)
HEMATOLOGY/ONCOLOGY CLINIC NOTE  Date of Service:  07/19/22   Patient Care Team: Joaquim Nam, MD as PCP - General (Family Medicine) Tonny Bollman, MD as PCP - Cardiology (Cardiology) Ihor Gully, MD (Inactive) as Attending Physician (Urology) Laurey Morale, MD as Consulting Physician (Cardiology) Kennon Rounds as Physician Assistant (Cardiology) Cherlyn Cushing, RN as Oncology Nurse Navigator  CHIEF COMPLAINTS/PURPOSE OF CONSULTATION:   Malignant neoplasm of prostate metastatic to intra-abdominal lymph node [facility])   Prior Therapy: CT-guided biopsy of the left pelvic lymph nodes on June 02, 2021 showed metastatic carcinoma staining positive for PSA.   Current therapy:   Eligard 45 mg every 6 months started in June 2023.  He is currently receiving that under the care of [facility].   Zytiga 1000 mg with prednisone 5 mg started on August 03, 2021.  His dose was reduced to 500 mg daily dose starting in September 2023.  HISTORY OF PRESENTING ILLNESS:   [name] is a wonderful 89 y.o. male who is here for continued evaluation and management of Malignant neoplasm of prostate metastatic to intra-abdominal lymph node (HCC). Patient was getting followed up by [name]. Patient is accompanied by his daughter during this visit.   Patient was initially diagnosed with advanced prostate cancer in May 2023. He also had castration-sensitive with lymphadenopathy and bone disease, PSA with 24 at the time of diagnosis. He was also previously diagnosed with superficial bladder tumor in 2012 and developed relapsed disease in May 2023 with CIS.  Patient's current treatment includes Eligard 45 mg every 6 months which was started in June 2023 and Zytiga 500 mg with Prednisone 5 mg. He previously on Zytiga 1000 mg from August 03, 2021 to September 2023. Reduced to 500 mg in September 2023.   Patient was last seen by [name] on 01/18/2022 and he was doing well overall. He denied  of any toxicities with Zytiga after reducing the dosage. During the last visit, Foley catheter was removed.   He reports he has been doing well overall since his last visit with [name]. He denies fever, chills, night sweats, new lumps/bumps, urinary problems, abnormal bowel movement, unexpected weight loss, bone pain, abdominal pain, chest pain, back pain. However, he does complain of increased fatigue, occasional bilateral leg swelling, occasional shortness of breath, and occasional constipation. His fatigue is affecting his daily activities, such as getting tired making breakfast. He notes that his fatigue causes bilateral leg weakness.  Patient's daughter notes that the patient recently had sinus infection since the last visit. He has recovered well from the infection. His daughter also reports that the patient has decreased appetite, but his weight has been stable.   He has dentures and no other dental issues.   He is complaint with all of his medications.   Patient's daughter notes that he has a blister on his right feet sole, which is affecting his walking and movement.   INTERVAL HISTORY:  ARDIE FRACASSO is a wonderful 89 y.o. male who is here for continued evaluation and management of Malignant neoplasm of prostate metastatic to intra-abdominal lymph node (HCC).   Patient was last seen by me on 04/19/2022 and he complained of  increased fatigue, occasional bilateral leg swelling, occasional shortness of breath, and occasional constipation.   Patient is accompanied by his family members during this visit. [facility]  Patient reports he has been doing fairly well since  our last visit. He complains of lethargy, fatigue, congestion and occasional shortness of breath. Patient attributes his SOB and nasal congestion to allergies.   Patient's daughter notes that the patient had one severe episode of difficulty breathing while sleeping on  his chair at home.   He has been regularly taking Prednisone and Zytiga as prescribed. He denies any toxicities with Zytiga.  Patient complains of mild toxicities with his current bladder treatment, which includes chills, fatigue, weakness. His family member notes that his symptoms improve around 2-3 days after his bladder treatment.   He denies fever, chills, night sweats, infection issues, abdominal pain, chest pain, back pain. He does complain of bilateral leg swelling.   Patient notes he has been eating well and has been staying well hydrated.   He notes he has to wake up around 2-3 times in the middle of the night due to urge to urination, but he does not urinated that much.    MEDICAL HISTORY:  Past Medical History:  Diagnosis Date   AAA (abdominal aortic aneurysm) (HCC)    s/p stent graft repair 2012   Arthritis    "right ankle" (04/17/2014)   Ascending aorta dilatation (HCC)    Echo 2021: 42 mm // Echocardiogram 10/22: EF 55-60, no RWMA, mildly reduced RVSF, RVSP 39.7, severe BAE, trivial MR, mod TR, trivial AI, mild AS (mean 8), ascending aorta 40 mm   Atrial fibrillation (HCC)    a. permanent;  b. Hematuria on Pradaxa;  c. cold intol and chills with Xarelto   Bladder cancer (HCC)    Cardiomyopathy (HCC)    a. Echo (03/2013): Inferior and inferolateral AK, EF 40-45%, mild MR, mod LAE, mod RAE, mod TR, mild PI, PASP 46   Chronic kidney disease    COPD (chronic obstructive pulmonary disease) (HCC)    Coronary artery disease s/p cabg x7  1992   a. s/p CABG in 1992 (X 7 - Dr. Andrey Campanile);  b.  Myoview (01/2011): Fixed inferior defect suggestive of prior MI versus diaphragmatic attenuation, apical reversibility possibly suspicious for small area of infarction with peri-infarct ischemia, no significant change from prior study, not gated, low risk study;  c.  Echo (05/2010): Mod LVH, EF 55-65%, mild AI, mild MR, moderate LAE, mild RAE, moderate    Hematuria bladder tumor ---  followed by Dr  Vernie Ammons   Hiatal hernia    repaired in 1992 w/[facility]   Hx of cardiovascular stress test    Lexiscan Myoview (03/2013):  No ischemia; not gated.   Hyperglycemia diet controlled   Hyperlipidemia    Hypertension    Hypothyroidism    Obesity    Organic impotence    Prostate cancer (HCC)    RBBB    S/P AAA repair using bifurcation graft    Sleep apnea no rx cpap   mild- tested  2012   Ventricular ectopy    mild    SURGICAL HISTORY: Past Surgical History:  Procedure Laterality Date   ABDOMINAL AORTIC ANEURYSM REPAIR  02/28/2010   using bifurcation graft   CARDIAC CATHETERIZATION  03/31/1990   Positive   CARDIOVASCULAR STRESS TEST  02-01-2011  NUCLEAR NO EXERCISE   LOW RISK STUDY.  SMALL APICAL DEFECT. NO EVIDENCE OF MULTIZONE ISCHEMIA.   CORONARY ARTERY BYPASS GRAFT  05/30/1990   CABG X 7   CYSTOSCOPY N/A 01/07/2022   Procedure: CYSTOSCOPY, BLADDER BIOPSY WITH FULGERATION BILATERAL RETROGRADE PYELOGRAM;  Surgeon: [name], MD;  Location: [facility];  Service: Urology;  Laterality: N/A;  45 MINUTES NEEDED FOR CASE  INTUBATED PARALYZED   CYSTOSCOPY WITH BIOPSY N/A 07/23/2021   Procedure: CYSTOSCOPY WITH  BLADDER BIOPSY AND BILATERAL RETROGRADE PYELOGRAM;  Surgeon: [name], MD;  Location: [facility];  Service: Urology;  Laterality: N/A;   ENDOVASCULAR STENT INSERTION  02/15/2011   Procedure: ENDOVASCULAR STENT GRAFT INSERTION;  Surgeon: Nilda Simmer, MD;  Location: [facility] OR;  Service: Vascular;  Laterality: N/A;  Insertion of endovascular stent graft    ETT  10/31/2000   [facility]   HIATAL HERNIA REPAIR  02/28/1990   "repaired when I had my OHS"   Hospital - Afib  03/28/2002   INGUINAL HERNIA REPAIR Left 03/01/1995   TOTAL ANKLE ARTHROPLASTY Right 04/17/2014   Procedure: RIGHT TOTAL ANKLE ARTHOPLASTY;  Surgeon: Toni Arthurs, MD;  Location: [facility] OR;  Service: Orthopedics;  Laterality: Right;   TRANSURETHRAL RESECTION OF BLADDER TUMOR  03/25/2011   Procedure: TRANSURETHRAL RESECTION  OF BLADDER TUMOR (TURBT);  Surgeon: Garnett Farm, MD;  Location: [facility];  Service: Urology;  Laterality: N/A;    SOCIAL HISTORY: Social History   Socioeconomic History   Marital status: Widowed    Spouse name: Not on file   Number of children: Not on file   Years of education: Not on file   Highest education level: Not on file  Occupational History   Occupation: retired Water quality scientist.    Employer: retired    Comment: 2001. Likes to play golf . Travels with motor home  Tobacco Use   Smoking status: Former    Packs/day: 1.25    Years: 30.00    Total pack years: 37.50    Types: Cigarettes    Quit date: 02/28/1990    Years since quitting: 32.1   Smokeless tobacco: Never  Vaping Use   Vaping Use: Never used  Substance and Sexual Activity   Alcohol use: No    Alcohol/week: 0.0 standard drinks of alcohol    Comment: basically no alcohol use.  only very rare alcohol use.    Drug use: No   Sexual activity: Yes  Other Topics Concern   Not on file  Social History Narrative   From [place]   Lives with girlfriend.     Divorced, remarried then widowed, 3 children (from 1st marriage)   Likes to golf   Prev 4 sport athlete in HS, football fan.    Social Determinants of Health   Financial Resource Strain: Low Risk  (12/09/2021)   Overall Financial Resource Strain (CARDIA)    Difficulty of Paying Living Expenses: Not hard at all  Food Insecurity: No Food Insecurity (12/09/2021)   Hunger Vital Sign    Worried About Running Out of Food in the Last Year: Never true    Ran Out of Food in the Last Year: Never true  Transportation Needs: No Transportation Needs (12/09/2021)   PRAPARE - Administrator, Civil Service (Medical): No    Lack of Transportation (Non-Medical): No  Physical Activity: Inactive (12/09/2021)   Exercise Vital Sign    Days of Exercise per Week: 0 days    Minutes of Exercise per Session: 0 min  Stress: No Stress Concern  Present (12/09/2021)   [employer] of Occupational Health - Occupational Stress Questionnaire    Feeling of Stress : Not at all  Social Connections: Socially Isolated (12/09/2021)   Social Connection and Isolation Panel [NHANES]    Frequency of Communication with Friends and Family:  Three times a week    Frequency of Social Gatherings with Friends and Family: Once a week    Attends Religious Services: Never    Database administrator or Organizations: No    Attends Banker Meetings: Never    Marital Status: Widowed  Intimate Partner Violence: Not At Risk (12/09/2021)   Humiliation, Afraid, Rape, and Kick questionnaire    Fear of Current or Ex-Partner: No    Emotionally Abused: No    Physically Abused: No    Sexually Abused: No    FAMILY HISTORY: Family History  Problem Relation Age of Onset   Obesity Mother 74       deceased   Hypertension Mother    Diabetes Mother    Heart disease Mother        before age 54   Hyperlipidemia Mother    Alcohol abuse Sister 3       deceased   Other Father        deceased unknown   Colon cancer Neg Hx    Prostate cancer Neg Hx    Anesthesia problems Neg Hx     ALLERGIES:  is allergic to dabigatran etexilate mesylate, xarelto [rivaroxaban], and metoprolol.  MEDICATIONS:  Current Outpatient Medications  Medication Sig Dispense Refill   abiraterone acetate (ZYTIGA) 250 MG tablet Take 2 tablets (500 mg total) by mouth daily. Take on an empty stomach 1 hour before or 2 hours after a meal 60 tablet 0   atorvastatin (LIPITOR) 40 MG tablet TAKE 1 TABLET BY MOUTH ONCE A DAY 90 tablet 3   azithromycin (ZITHROMAX) 250 MG tablet Take 1 tablet (250 mg total) by mouth daily. Take first 2 tablets together, then 1 every day until finished. 6 tablet 0   docusate sodium (COLACE) 100 MG capsule Take 1 capsule (100 mg total) by mouth daily as needed for up to 30 doses. 30 capsule 0   ELIQUIS 5 MG TABS tablet TAKE 1 TABLET BY MOUTH TWICE  (2) DAILY (Patient taking differently: Take 5 mg by mouth daily.) 60 tablet 5   finasteride (PROSCAR) 5 MG tablet Take 5 mg by mouth daily.     levothyroxine (SYNTHROID) 137 MCG tablet TAKE 1 TABLET BY MOUTH ONCE A DAY ON AN EMPTY STOMACH 90 tablet 0   oxyCODONE-acetaminophen (PERCOCET) 5-325 MG tablet Take 1 tablet by mouth every 4 (four) hours as needed for up to 12 doses for severe pain. 12 tablet 0   predniSONE (DELTASONE) 5 MG tablet TAKE 1 TABLET BY MOUTH ONCE A DAY WITH BREAKFAST. 90 tablet 1   ramipril (ALTACE) 5 MG capsule Take 1 capsule (5 mg total) by mouth 2 (two) times daily. (Patient taking differently: Take 5 mg by mouth daily.) 180 capsule 3   sildenafil (REVATIO) 20 MG tablet TAKE 3 TO 5 TABLETS BY MOUTH DAILY AS NEEDED 50 tablet 12   tamsulosin (FLOMAX) 0.4 MG CAPS capsule Take 0.4 mg by mouth at bedtime.     No current facility-administered medications for this visit.    REVIEW OF SYSTEMS:    10 Point review of Systems was done is negative except as noted above.  PHYSICAL EXAMINATION: ECOG PERFORMANCE STATUS: 2 - Symptomatic, <50% confined to bed  . Vitals:   04/19/22 1304  BP: (!) 165/85  Pulse: 83  Resp: 18  Temp: (!) 97.5 F (36.4 C)  SpO2: 99%   Filed Weights   04/19/22 1304  Weight: 264 lb 1.6 oz (119.8 kg)   .  Body mass index is 36.83 kg/m.  GENERAL:alert, in no acute distress and comfortable SKIN: no acute rashes, no significant lesions EYES: conjunctiva are pink and non-injected, sclera anicteric OROPHARYNX: MMM, no exudates, no oropharyngeal erythema or ulceration NECK: supple, no JVD LYMPH:  no palpable lymphadenopathy in the cervical, axillary or inguinal regions LUNGS: clear to auscultation b/l with normal respiratory effort HEART: regular rate & rhythm ABDOMEN:  normoactive bowel sounds , non tender, not distended. Extremity: no pedal edema PSYCH: alert & oriented x 3 with fluent speech NEURO: no focal motor/sensory deficits  LABORATORY  DATA:  I have reviewed the data as listed .    Latest Ref Rng & Units 04/19/2022   12:34 PM 01/18/2022    9:58 AM 01/07/2022   11:27 AM  CBC  WBC 4.0 - 10.5 K/uL 7.3  5.7  5.7   Hemoglobin 13.0 - 17.0 g/dL 16.1  09.6  04.5   Hematocrit 39.0 - 52.0 % 40.0  40.6  41.5   Platelets 150 - 400 K/uL 194  185  177    .    Latest Ref Rng & Units 04/19/2022   12:34 PM 01/18/2022    9:58 AM 12/21/2021   11:21 AM  CMP  Glucose 70 - 99 mg/dL 409  811  914   BUN 8 - 23 mg/dL 11  12  13    Creatinine 0.61 - 1.24 mg/dL 7.82  9.56  2.13   Sodium 135 - 145 mmol/L 141  141  141   Potassium 3.5 - 5.1 mmol/L 3.2  4.0  3.8   Chloride 98 - 111 mmol/L 106  106  104   CO2 22 - 32 mmol/L 29  30  29    Calcium 8.9 - 10.3 mg/dL 9.0  9.4  9.4   Total Protein 6.5 - 8.1 g/dL 6.9  6.7    Total Bilirubin 0.3 - 1.2 mg/dL 0.8  0.8    Alkaline Phos 38 - 126 U/L 100  79    AST 15 - 41 U/L 13  10    ALT 0 - 44 U/L 10  8      RADIOGRAPHIC STUDIES: I have personally reviewed the radiological images as listed and agreed with the findings in the report. No results found.  ASSESSMENT & PLAN:   1.  Castration-sensitive advanced prostate cancer with lymphadenopathy diagnosed in May 2023.    2.  Androgen deprivation: He will continue to receive Eligard every 6 months under the care of Dr. Cardell Peach and urology. Was last given in December 2023.    3.  Bone directed therapy: He is to continue calcium and vitamin D supplements.    4.  Bladder cancer: He continues to follow with urology regarding this issue.  He has carcinoma in situ on last cystoscopy.  5.  Hypertension: His blood pressure is elevated today and normally is during doctor's visits.  Ambulatory blood pressure monitoring per his report are low are close to normal range.  I have asked him to check his blood pressure periodically and address that with his primary care physician if he is continue to have elevated blood pressure at home.    6.  Urinary retention:  Resolved and his catheter has been removed.  Continues to follow with Dr. Cardell Peach regarding this issue.   PLAN: -Discussed lab results from today, 07/19/2022, with the patient. CBC and CMP are stable.  -Recommended to visit Urologist regarding toxicities with bladder treatment.  -Patient has been tolerating his  reduced dose Zytiga medicine without any new or severe toxicities.  -Patient can continue with his current Zytiga dose without any modification.  -wants to hold off on Xgeva and is pending dental clearance.2 -Answered all of patient's questions.  -Recommend to wear compression socks to reduce leg swelling.    FOLLOW-UP: RTC with Dr Candise Che with labs in 3 months  The total time spent in the appointment was 25 minutes* .  All of the patient's questions were answered with apparent satisfaction. The patient knows to call the clinic with any problems, questions or concerns.   Wyvonnia Lora MD MS AAHIVMS [facility] Hematology/Oncology Physician [facility]  .*Total Encounter Time as defined by the Centers for Medicare and Medicaid Services includes, in addition to the face-to-face time of a patient visit (documented in the note above) non-face-to-face time: obtaining and reviewing outside history, ordering and reviewing medications, tests or procedures, care coordination (communications with other health care professionals or caregivers) and documentation in the medical record.   I, Ok Edwards, am acting as a Neurosurgeon for Wyvonnia Lora, MD.  .I have reviewed the above documentation for accuracy and completeness, and I agree with the above. Johney Maine MD

## 2022-07-20 ENCOUNTER — Telehealth: Payer: Self-pay | Admitting: Hematology

## 2022-07-21 LAB — PROSTATE-SPECIFIC AG, SERUM (LABCORP): Prostate Specific Ag, Serum: <0.1 ng/mL (ref 0.0–4.0)

## 2022-07-28 ENCOUNTER — Other Ambulatory Visit (HOSPITAL_COMMUNITY): Payer: Self-pay

## 2022-07-28 ENCOUNTER — Telehealth: Payer: Self-pay | Admitting: Pharmacy Technician

## 2022-07-28 ENCOUNTER — Other Ambulatory Visit: Payer: Self-pay

## 2022-07-28 DIAGNOSIS — C61 Malignant neoplasm of prostate: Secondary | ICD-10-CM

## 2022-07-28 MED ORDER — PREDNISONE 5 MG PO TABS: 5.0000 mg | ORAL_TABLET | Freq: Every day | ORAL | 1 refills | Status: DC

## 2022-07-28 NOTE — Telephone Encounter (Signed)
Oral Oncology Patient Advocate Encounter  Was successful in securing patient a $8,000 grant from [facility] to provide copayment coverage for abiraterone.  This will keep the out of pocket expense at $0.     [employer] ID: [id number]   The billing information is as follows and has been shared with [facility].    RxBin: [id number] PCN: [id number] Member ID: [id number] Group ID: [id number] Dates of Eligibility: 06/28/22 through 06/27/23  Fund:  Prostate  Jinger Neighbors, CPhT-Adv Oncology Pharmacy Patient Advocate [facility] Direct Number: [phone]  Fax: [phone]

## 2022-07-29 ENCOUNTER — Encounter: Payer: Self-pay | Admitting: Nurse Practitioner

## 2022-07-29 ENCOUNTER — Telehealth (HOSPITAL_BASED_OUTPATIENT_CLINIC_OR_DEPARTMENT_OTHER): Payer: Medicare Other | Admitting: Nurse Practitioner

## 2022-07-29 ENCOUNTER — Other Ambulatory Visit: Payer: Self-pay

## 2022-07-29 ENCOUNTER — Telehealth: Payer: Self-pay

## 2022-07-29 DIAGNOSIS — Z7189 Other specified counseling: Secondary | ICD-10-CM

## 2022-07-29 DIAGNOSIS — Z515 Encounter for palliative care: Secondary | ICD-10-CM

## 2022-07-29 DIAGNOSIS — R530 Neoplastic (malignant) related fatigue: Secondary | ICD-10-CM

## 2022-07-29 DIAGNOSIS — C61 Malignant neoplasm of prostate: Secondary | ICD-10-CM

## 2022-07-29 NOTE — Telephone Encounter (Signed)
LVM to schedule initial palliative care visit/consult.

## 2022-07-29 NOTE — Progress Notes (Signed)
Order for home palliative placed per NP, [facility] liaison notified.

## 2022-07-29 NOTE — Telephone Encounter (Signed)
I connected with patient's daughter, Purnell Shoemaker via phone. Purnell Shoemaker contacted office to provide update on how Mr. Breon was doing. Reports appetite is good. Shares he ran out of his dexamethasone for almost 3 days however was able to get on yesterday. On chart review refilled by Dr. Candise Che. Family reports patient has been off balance for several days and unsure if this is related to not taking steroid. Several falls without injury. Purnell Shoemaker is requesting gait belt to assist with ambulation and safety. Advised this will be ordered as requested.   We discussed palliative's goals and philosophy of care. She speaks to some support in the home occasionally to provide insight and recommendations allowing him to do as good as he can for as long as he can. Education provided on outpatient palliative support in the community. Daughter verbalized understanding and would like referral while also remaining connected with our team here at the [facility]. She is aware referral will be made. If she has not received a call by mid week next week knows to notify our office.   All questions answered and support provided.

## 2022-07-31 ENCOUNTER — Telehealth: Payer: Self-pay | Admitting: Family Medicine

## 2022-07-31 NOTE — Telephone Encounter (Signed)
Please check with Dr. Eugenia Mcalpine office with urology. I need his input.  See if he had (or if he is going to have) CT abdomen pelvis through urology.  We may be able to use that information to follow-up on his previous AAA.  Thanks.

## 2022-08-01 ENCOUNTER — Telehealth: Payer: Self-pay

## 2022-08-01 NOTE — Telephone Encounter (Signed)
Return TC to patient. Palliative care consult visit scheduled for Thu 6/13 @noon .

## 2022-08-02 ENCOUNTER — Telehealth: Payer: Self-pay

## 2022-08-02 NOTE — Telephone Encounter (Signed)
Patients daughter in law, Docia Furl, called to reschedule initial PC visit. Visit rescheduled to 6/25 @1230 , per family request.

## 2022-08-04 ENCOUNTER — Telehealth: Payer: Self-pay | Admitting: Hematology

## 2022-08-04 ENCOUNTER — Other Ambulatory Visit: Payer: Self-pay

## 2022-08-04 ENCOUNTER — Other Ambulatory Visit (HOSPITAL_COMMUNITY): Payer: Self-pay

## 2022-08-04 ENCOUNTER — Other Ambulatory Visit: Payer: Self-pay | Admitting: Hematology

## 2022-08-04 DIAGNOSIS — C61 Malignant neoplasm of prostate: Secondary | ICD-10-CM

## 2022-08-04 MED ORDER — ABIRATERONE ACETATE 250 MG PO TABS
500.0000 mg | ORAL_TABLET | Freq: Every day | ORAL | 0 refills | Status: DC
  Filled 2022-08-04: qty 60, 30d supply, fill #0

## 2022-08-04 NOTE — Telephone Encounter (Signed)
Called Dr. Hardie Shackleton office and they did a CT abdomen on 07/01/22; I requested the results be faxed to [place].

## 2022-08-04 NOTE — Telephone Encounter (Signed)
Noted. Thanks.

## 2022-08-04 NOTE — Telephone Encounter (Signed)
Scheduled appointment scheduling message. Left voicemail.

## 2022-08-10 ENCOUNTER — Other Ambulatory Visit (HOSPITAL_COMMUNITY): Payer: Self-pay

## 2022-08-10 DIAGNOSIS — C61 Malignant neoplasm of prostate: Secondary | ICD-10-CM | POA: Diagnosis not present

## 2022-08-10 DIAGNOSIS — Z5111 Encounter for antineoplastic chemotherapy: Secondary | ICD-10-CM | POA: Diagnosis not present

## 2022-08-11 ENCOUNTER — Other Ambulatory Visit: Payer: Medicare Other

## 2022-08-15 ENCOUNTER — Other Ambulatory Visit: Payer: Self-pay | Admitting: Family Medicine

## 2022-08-16 ENCOUNTER — Telehealth: Payer: Self-pay | Admitting: Hematology

## 2022-08-16 NOTE — Telephone Encounter (Signed)
Cancelled appointment per "patient advise request." Left voicemail with patients daughter [name] about rescheduling appointment.

## 2022-08-23 ENCOUNTER — Other Ambulatory Visit: Payer: Medicare Other

## 2022-08-29 ENCOUNTER — Other Ambulatory Visit: Payer: Self-pay

## 2022-08-29 ENCOUNTER — Other Ambulatory Visit (HOSPITAL_COMMUNITY): Payer: Self-pay

## 2022-08-29 ENCOUNTER — Other Ambulatory Visit: Payer: Self-pay | Admitting: Hematology

## 2022-08-29 DIAGNOSIS — C772 Secondary and unspecified malignant neoplasm of intra-abdominal lymph nodes: Secondary | ICD-10-CM

## 2022-08-29 DIAGNOSIS — C61 Malignant neoplasm of prostate: Secondary | ICD-10-CM

## 2022-08-29 MED ORDER — ABIRATERONE ACETATE 250 MG PO TABS
500.0000 mg | ORAL_TABLET | Freq: Every day | ORAL | 0 refills | Status: DC
  Filled 2022-08-29: qty 60, 30d supply, fill #0

## 2022-09-02 ENCOUNTER — Other Ambulatory Visit (HOSPITAL_COMMUNITY): Payer: Self-pay

## 2022-09-06 ENCOUNTER — Other Ambulatory Visit (HOSPITAL_COMMUNITY): Payer: Self-pay

## 2022-09-06 NOTE — Progress Notes (Unsigned)
Palliative Medicine [facility]  Telephone:(336) [phone] Fax:(336) [phone]   Name: [name] Date: 09/06/2022 MRN: [id number]  DOB: 11-Sep-1933  Patient Care Team: Joaquim Nam, MD as PCP - General (Family Medicine) Tonny Bollman, MD as PCP - Cardiology (Cardiology) Laurey Morale, MD as Consulting Physician (Cardiology) Kennon Rounds as Physician Assistant (Cardiology) Cherlyn Cushing, RN as Oncology Nurse Navigator [name], [name], NP as Nurse Practitioner (Nurse Practitioner) [name], MD as Consulting Physician (Urology)   I connected with [name] on 09/06/22 at 11:00 AM EDT by phone and verified that I am speaking with the correct person using two identifiers.   I discussed the limitations, risks, security and privacy concerns of performing an evaluation and management service by telemedicine and the availability of in-person appointments. I also discussed with the patient that there may be a patient responsible charge related to this service. The patient expressed understanding and agreed to proceed.   Other persons participating in the visit and their role in the encounter: n/a   Patient's location: home  Provider's location: [facility]   Chief Complaint: f/u of symptom management    INTERVAL HISTORY: [name] is a 89 y.o. male with oncologic medical history including malignant neoplasm of prostate (06/2022) metastatic disease to intra-abdominal lymph node. Previous history of bladder cancer (06/2011), abdominal aortic aneurysm without rupture, CHF, A-Fib, and HLD. Palliative ask to see for symptom management and goals of care.     SOCIAL HISTORY:     reports that he quit smoking about 32 years ago. His smoking use included cigarettes. He has a 37.50 pack-year smoking history. He has never used smokeless tobacco. He reports that he does not drink alcohol and does not use drugs.  ADVANCE DIRECTIVES:  None on  file  CODE STATUS: Full code  PAST MEDICAL HISTORY: Past Medical History:  Diagnosis Date   AAA (abdominal aortic aneurysm) (HCC)    s/p stent graft repair 2012   Arthritis    "right ankle" (04/17/2014)   Ascending aorta dilatation (HCC)    Echo 2021: 42 mm // Echocardiogram 10/22: EF 55-60, no RWMA, mildly reduced RVSF, RVSP 39.7, severe BAE, trivial MR, mod TR, trivial AI, mild AS (mean 8), ascending aorta 40 mm   Atrial fibrillation (HCC)    a. permanent;  b. Hematuria on Pradaxa;  c. cold intol and chills with Xarelto   Bladder cancer (HCC)    Cardiomyopathy (HCC)    a. Echo (03/2013): Inferior and inferolateral AK, EF 40-45%, mild MR, mod LAE, mod RAE, mod TR, mild PI, PASP 46   Chronic kidney disease    COPD (chronic obstructive pulmonary disease) (HCC)    Coronary artery disease s/p cabg x7  1992   a. s/p CABG in 1992 (X 7 - Dr. Andrey Campanile);  b.  Myoview (01/2011): Fixed inferior defect suggestive of prior MI versus diaphragmatic attenuation, apical reversibility possibly suspicious for small area of infarction with peri-infarct ischemia, no significant change from prior study, not gated, low risk study;  c.  Echo (05/2010): Mod LVH, EF 55-65%, mild AI, mild MR, moderate LAE, mild RAE, moderate    Hematuria bladder tumor ---  followed by Dr Vernie Ammons   Hiatal hernia    repaired in 1992 w/[facility]   Hx of cardiovascular stress test    Lexiscan Myoview (03/2013):  No ischemia; not gated.   Hyperglycemia diet controlled   Hyperlipidemia    Hypertension  Hypothyroidism    Obesity    Organic impotence    Prostate cancer (HCC)    RBBB    S/P AAA repair using bifurcation graft    Sleep apnea no rx cpap   mild- tested  2012   Ventricular ectopy    mild    ALLERGIES:  is allergic to dabigatran etexilate mesylate, xarelto [rivaroxaban], and metoprolol.  MEDICATIONS:  Current Outpatient Medications  Medication Sig Dispense Refill   abiraterone acetate (ZYTIGA) 250 MG tablet Take 2  tablets (500 mg total) by mouth daily. Take on an empty stomach 1 hour before or 2 hours after a meal 60 tablet 0   apixaban (ELIQUIS) 5 MG TABS tablet TAKE 1 TABLET BY MOUTH DAILY     atorvastatin (LIPITOR) 40 MG tablet TAKE ONE TABLET BY MOUTH ONCE A DAY 90 tablet 1   docusate sodium (COLACE) 100 MG capsule Take 1 capsule (100 mg total) by mouth daily as needed for up to 30 doses. 30 capsule 0   finasteride (PROSCAR) 5 MG tablet Take 5 mg by mouth daily.     levothyroxine (SYNTHROID) 137 MCG tablet TAKE ONE TABLET BY MOUTH ONCE A DAY ON AN EMPTY STOMACH 90 tablet 2   predniSONE (DELTASONE) 5 MG tablet Take 1 tablet (5 mg total) by mouth daily with breakfast. 90 tablet 1   ramipril (ALTACE) 5 MG capsule TAKE ONE CAPSULE BY MOUTH TWO TIMES DAILY 180 capsule 0   sildenafil (REVATIO) 20 MG tablet TAKE 3 TO 5 TABLETS BY MOUTH DAILY AS NEEDED (Patient not taking: Reported on 07/19/2022) 50 tablet 12   tamsulosin (FLOMAX) 0.4 MG CAPS capsule Take 0.4 mg by mouth at bedtime.     No current facility-administered medications for this visit.    VITAL SIGNS: There were no vitals taken for this visit. There were no vitals filed for this visit.  Estimated body mass index is 37.2 kg/m as calculated from the following:   Height as of 07/19/22: 5\' 11"  (1.803 m).   Weight as of 07/19/22: 266 lb 11.2 oz (121 kg).   PERFORMANCE STATUS (ECOG) : 0 - Asymptomatic   Physical Exam General: NAD Cardiovascular: regular rate and rhythm Pulmonary: clear ant fields Abdomen: soft, nontender, + bowel sounds Extremities: no edema, no joint deformities Skin: no rashes Neurological:   IMPRESSION: ***  We discussed Her current illness and what it means in the larger context of Her on-going co-morbidities. Natural disease trajectory and expectations were discussed.  I discussed the importance of continued conversation with family and their medical providers regarding overall plan of care and treatment options,  ensuring decisions are within the context of the patients values and GOCs.  PLAN: Established therapeutic relationship. Education provided on palliative's role in collaboration with their Oncology/Radiation team. No symptom management needs at this time.  Extensive goals of care discussions. Patient and family realistic.  DNR/DNI goldenrod completed and original given to patient.  MOST form completed and original given to patient. Copy placed on chart.  I will plan to see patient back in 4-6 weeks in collaboration to other oncology appointments.  Patient and family knows to contact office sooner if needed.   Patient expressed understanding and was in agreement with this plan. He also understands that He can call the clinic at any time with any questions, concerns, or complaints.   Any controlled substances utilized were prescribed in the context of palliative care. PDMP has been reviewed.    Visit consisted of counseling and  education dealing with the complex and emotionally intense issues of symptom management and palliative care in the setting of serious and potentially life-threatening illness.Greater than 50%  of this time was spent counseling and coordinating care related to the above assessment and plan.  Willette Alma, AGPCNP-BC  Palliative Medicine Team/[facility]  *Please note that this is a verbal dictation therefore any spelling or grammatical errors are due to the "Dragon [employer] One" system interpretation.

## 2022-09-07 ENCOUNTER — Inpatient Hospital Stay: Payer: Medicare Other | Attending: Hematology | Admitting: Nurse Practitioner

## 2022-09-07 ENCOUNTER — Encounter: Payer: Self-pay | Admitting: Nurse Practitioner

## 2022-09-07 DIAGNOSIS — C679 Malignant neoplasm of bladder, unspecified: Secondary | ICD-10-CM

## 2022-09-07 DIAGNOSIS — Z515 Encounter for palliative care: Secondary | ICD-10-CM | POA: Diagnosis not present

## 2022-09-07 DIAGNOSIS — Z7189 Other specified counseling: Secondary | ICD-10-CM | POA: Diagnosis not present

## 2022-09-07 DIAGNOSIS — R530 Neoplastic (malignant) related fatigue: Secondary | ICD-10-CM

## 2022-10-04 ENCOUNTER — Other Ambulatory Visit: Payer: Self-pay | Admitting: Hematology

## 2022-10-04 ENCOUNTER — Other Ambulatory Visit: Payer: Self-pay

## 2022-10-04 ENCOUNTER — Other Ambulatory Visit (HOSPITAL_COMMUNITY): Payer: Self-pay

## 2022-10-04 DIAGNOSIS — C772 Secondary and unspecified malignant neoplasm of intra-abdominal lymph nodes: Secondary | ICD-10-CM

## 2022-10-04 DIAGNOSIS — C61 Malignant neoplasm of prostate: Secondary | ICD-10-CM | POA: Diagnosis not present

## 2022-10-04 MED ORDER — ABIRATERONE ACETATE 250 MG PO TABS
500.0000 mg | ORAL_TABLET | Freq: Every day | ORAL | 0 refills | Status: DC
  Filled 2022-10-04: qty 60, 30d supply, fill #0

## 2022-10-11 ENCOUNTER — Other Ambulatory Visit (HOSPITAL_COMMUNITY): Payer: Self-pay

## 2022-10-11 DIAGNOSIS — C61 Malignant neoplasm of prostate: Secondary | ICD-10-CM | POA: Diagnosis not present

## 2022-10-11 DIAGNOSIS — R351 Nocturia: Secondary | ICD-10-CM | POA: Diagnosis not present

## 2022-10-11 DIAGNOSIS — C778 Secondary and unspecified malignant neoplasm of lymph nodes of multiple regions: Secondary | ICD-10-CM | POA: Diagnosis not present

## 2022-10-11 DIAGNOSIS — N401 Enlarged prostate with lower urinary tract symptoms: Secondary | ICD-10-CM | POA: Diagnosis not present

## 2022-10-11 DIAGNOSIS — C678 Malignant neoplasm of overlapping sites of bladder: Secondary | ICD-10-CM | POA: Diagnosis not present

## 2022-10-17 ENCOUNTER — Other Ambulatory Visit: Payer: Self-pay

## 2022-10-17 DIAGNOSIS — C679 Malignant neoplasm of bladder, unspecified: Secondary | ICD-10-CM

## 2022-10-17 DIAGNOSIS — C61 Malignant neoplasm of prostate: Secondary | ICD-10-CM

## 2022-10-17 NOTE — Progress Notes (Deleted)
Palliative Medicine [facility]  Telephone:(336) [phone] Fax:(336) [phone]   Name: HAYLEY DESHAIES Date: 10/17/2022 MRN: [id number]  DOB: 11/13/1933  Patient Care Team: Joaquim Nam, MD as PCP - General (Family Medicine) Tonny Bollman, MD as PCP - Cardiology (Cardiology) Laurey Morale, MD as Consulting Physician (Cardiology) Kennon Rounds as Physician Assistant (Cardiology) Cherlyn Cushing, RN as Oncology Nurse Navigator [name], [name], NP as Nurse Practitioner (Nurse Practitioner) [name], MD as Consulting Physician (Urology)   INTERVAL HISTORY: JORAWAR STUTE is a 89 y.o. male with oncologic medical history including malignant neoplasm of prostate (06/2022) metastatic disease to intra-abdominal lymph node. Previous history of bladder cancer (06/2011), abdominal aortic aneurysm without rupture, CHF, A-Fib, and HLD. Palliative ask to see for symptom management and goals of care.     SOCIAL HISTORY:     reports that he quit smoking about 32 years ago. His smoking use included cigarettes. He started smoking about 62 years ago. He has a 37.5 pack-year smoking history. He has never used smokeless tobacco. He reports that he does not drink alcohol and does not use drugs.  ADVANCE DIRECTIVES:  None on file  CODE STATUS: Full code  PAST MEDICAL HISTORY: Past Medical History:  Diagnosis Date   AAA (abdominal aortic aneurysm) (HCC)    s/p stent graft repair 2012   Arthritis    "right ankle" (04/17/2014)   Ascending aorta dilatation (HCC)    Echo 2021: 42 mm // Echocardiogram 10/22: EF 55-60, no RWMA, mildly reduced RVSF, RVSP 39.7, severe BAE, trivial MR, mod TR, trivial AI, mild AS (mean 8), ascending aorta 40 mm   Atrial fibrillation (HCC)    a. permanent;  b. Hematuria on Pradaxa;  c. cold intol and chills with Xarelto   Bladder cancer (HCC)    Cardiomyopathy (HCC)    a. Echo (03/2013): Inferior and inferolateral AK, EF 40-45%,  mild MR, mod LAE, mod RAE, mod TR, mild PI, PASP 46   Chronic kidney disease    COPD (chronic obstructive pulmonary disease) (HCC)    Coronary artery disease s/p cabg x7  1992   a. s/p CABG in 1992 (X 7 - Dr. Andrey Campanile);  b.  Myoview (01/2011): Fixed inferior defect suggestive of prior MI versus diaphragmatic attenuation, apical reversibility possibly suspicious for small area of infarction with peri-infarct ischemia, no significant change from prior study, not gated, low risk study;  c.  Echo (05/2010): Mod LVH, EF 55-65%, mild AI, mild MR, moderate LAE, mild RAE, moderate    Hematuria bladder tumor ---  followed by Dr Vernie Ammons   Hiatal hernia    repaired in 1992 w/[facility]   Hx of cardiovascular stress test    Lexiscan Myoview (03/2013):  No ischemia; not gated.   Hyperglycemia diet controlled   Hyperlipidemia    Hypertension    Hypothyroidism    Obesity    Organic impotence    Prostate cancer (HCC)    RBBB    S/P AAA repair using bifurcation graft    Sleep apnea no rx cpap   mild- tested  2012   Ventricular ectopy    mild    ALLERGIES:  is allergic to dabigatran etexilate mesylate, xarelto [rivaroxaban], and metoprolol.  MEDICATIONS:  Current Outpatient Medications  Medication Sig Dispense Refill   abiraterone acetate (ZYTIGA) 250 MG tablet Take 2 tablets (500 mg total) by mouth daily. Take on an empty stomach 1 hour before or 2 hours after  a meal 60 tablet 0   apixaban (ELIQUIS) 5 MG TABS tablet TAKE 1 TABLET BY MOUTH DAILY     atorvastatin (LIPITOR) 40 MG tablet TAKE ONE TABLET BY MOUTH ONCE A DAY 90 tablet 1   docusate sodium (COLACE) 100 MG capsule Take 1 capsule (100 mg total) by mouth daily as needed for up to 30 doses. 30 capsule 0   finasteride (PROSCAR) 5 MG tablet Take 5 mg by mouth daily.     levothyroxine (SYNTHROID) 137 MCG tablet TAKE ONE TABLET BY MOUTH ONCE A DAY ON AN EMPTY STOMACH 90 tablet 2   predniSONE (DELTASONE) 5 MG tablet Take 1 tablet (5 mg total) by mouth daily  with breakfast. 90 tablet 1   ramipril (ALTACE) 5 MG capsule TAKE ONE CAPSULE BY MOUTH TWO TIMES DAILY 180 capsule 0   sildenafil (REVATIO) 20 MG tablet TAKE 3 TO 5 TABLETS BY MOUTH DAILY AS NEEDED (Patient not taking: Reported on 07/19/2022) 50 tablet 12   tamsulosin (FLOMAX) 0.4 MG CAPS capsule Take 0.4 mg by mouth at bedtime.     No current facility-administered medications for this visit.    VITAL SIGNS: There were no vitals taken for this visit. There were no vitals filed for this visit.  Estimated body mass index is 37.2 kg/m as calculated from the following:   Height as of 07/19/22: 5\' 11"  (1.803 m).   Weight as of 07/19/22: 266 lb 11.2 oz (121 kg).   PERFORMANCE STATUS (ECOG) : 0 - Asymptomatic   IMPRESSION:  I discussed the importance of continued conversation with family and their medical providers regarding overall plan of care and treatment options, ensuring decisions are within the context of the patients values and GOCs.  PLAN:  No symptom management needs at this time.  DNR/DNI. Patient has out of facility form MOST form on file  I will plan to see patient back in 4-6 weeks in collaboration to other oncology appointments.  Patient and family knows to contact office sooner if needed.   Patient expressed understanding and was in agreement with this plan. He also understands that He can call the clinic at any time with any questions, concerns, or complaints.   Any controlled substances utilized were prescribed in the context of palliative care. PDMP has been reviewed.    Visit consisted of counseling and education dealing with the complex and emotionally intense issues of symptom management and palliative care in the setting of serious and potentially life-threatening illness.Greater than 50%  of this time was spent counseling and coordinating care related to the above assessment and plan.  Willette Alma, AGPCNP-BC  Palliative Medicine Team/[facility]  *Please note that this is a verbal dictation therefore any spelling or grammatical errors are due to the "Dragon Medical One" system interpretation.

## 2022-10-18 ENCOUNTER — Inpatient Hospital Stay: Payer: Medicare Other

## 2022-10-18 ENCOUNTER — Ambulatory Visit (HOSPITAL_COMMUNITY)
Admission: RE | Admit: 2022-10-18 | Discharge: 2022-10-18 | Disposition: A | Discharge status: Home / Self Care | Payer: Medicare Other | Source: Ambulatory Visit | Attending: Hematology | Admitting: Hematology

## 2022-10-18 ENCOUNTER — Inpatient Hospital Stay: Payer: Medicare Other | Admitting: Nurse Practitioner

## 2022-10-18 ENCOUNTER — Inpatient Hospital Stay: Payer: Medicare Other | Attending: Hematology

## 2022-10-18 ENCOUNTER — Inpatient Hospital Stay: Payer: Medicare Other | Admitting: Hematology

## 2022-10-18 VITALS — BP 141/65 | HR 72 | Temp 98.2°F | Resp 18 | Wt 266.7 lb

## 2022-10-18 DIAGNOSIS — C775 Secondary and unspecified malignant neoplasm of intrapelvic lymph nodes: Secondary | ICD-10-CM | POA: Insufficient documentation

## 2022-10-18 DIAGNOSIS — C772 Secondary and unspecified malignant neoplasm of intra-abdominal lymph nodes: Secondary | ICD-10-CM | POA: Insufficient documentation

## 2022-10-18 DIAGNOSIS — R911 Solitary pulmonary nodule: Secondary | ICD-10-CM | POA: Diagnosis not present

## 2022-10-18 DIAGNOSIS — C7951 Secondary malignant neoplasm of bone: Secondary | ICD-10-CM | POA: Diagnosis not present

## 2022-10-18 DIAGNOSIS — C61 Malignant neoplasm of prostate: Secondary | ICD-10-CM | POA: Insufficient documentation

## 2022-10-18 DIAGNOSIS — Z8551 Personal history of malignant neoplasm of bladder: Secondary | ICD-10-CM | POA: Diagnosis not present

## 2022-10-18 DIAGNOSIS — Z79899 Other long term (current) drug therapy: Secondary | ICD-10-CM | POA: Insufficient documentation

## 2022-10-18 DIAGNOSIS — R0602 Shortness of breath: Secondary | ICD-10-CM | POA: Diagnosis not present

## 2022-10-18 DIAGNOSIS — I10 Essential (primary) hypertension: Secondary | ICD-10-CM | POA: Diagnosis not present

## 2022-10-18 DIAGNOSIS — Z7901 Long term (current) use of anticoagulants: Secondary | ICD-10-CM | POA: Insufficient documentation

## 2022-10-18 DIAGNOSIS — Z79818 Long term (current) use of other agents affecting estrogen receptors and estrogen levels: Secondary | ICD-10-CM | POA: Diagnosis not present

## 2022-10-18 DIAGNOSIS — Z87891 Personal history of nicotine dependence: Secondary | ICD-10-CM | POA: Insufficient documentation

## 2022-10-18 DIAGNOSIS — R918 Other nonspecific abnormal finding of lung field: Secondary | ICD-10-CM | POA: Diagnosis not present

## 2022-10-18 DIAGNOSIS — C679 Malignant neoplasm of bladder, unspecified: Secondary | ICD-10-CM

## 2022-10-18 LAB — CMP (CANCER CENTER ONLY)
ALT: 9 U/L (ref 0–44)
AST: 11 U/L — ABNORMAL LOW (ref 15–41)
Albumin: 3.7 g/dL (ref 3.5–5.0)
Alkaline Phosphatase: 88 U/L (ref 38–126)
Anion gap: 6 (ref 5–15)
BUN: 15 mg/dL (ref 8–23)
CO2: 30 mmol/L (ref 22–32)
Calcium: 9 mg/dL (ref 8.9–10.3)
Chloride: 104 mmol/L (ref 98–111)
Creatinine: 0.93 mg/dL (ref 0.61–1.24)
GFR, Estimated: >60 mL/min (ref >60)
Glucose, Bld: 192 mg/dL — ABNORMAL HIGH (ref 70–99)
Potassium: 3.7 mmol/L (ref 3.5–5.1)
Sodium: 140 mmol/L (ref 135–145)
Total Bilirubin: 0.9 mg/dL (ref 0.3–1.2)
Total Protein: 6.9 g/dL (ref 6.5–8.1)

## 2022-10-18 LAB — CBC WITH DIFFERENTIAL (CANCER CENTER ONLY)
Abs Immature Granulocytes: 0.02 10*3/uL (ref 0.00–0.07)
Basophils Absolute: 0.1 10*3/uL (ref 0.0–0.1)
Basophils Relative: 1 %
Eosinophils Absolute: 0.2 10*3/uL (ref 0.0–0.5)
Eosinophils Relative: 3 %
HCT: 39.1 % (ref 39.0–52.0)
Hemoglobin: 12.6 g/dL — ABNORMAL LOW (ref 13.0–17.0)
Immature Granulocytes: 0 %
Lymphocytes Relative: 17 %
Lymphs Abs: 1.1 10*3/uL (ref 0.7–4.0)
MCH: 29.2 pg (ref 26.0–34.0)
MCHC: 32.2 g/dL (ref 30.0–36.0)
MCV: 90.5 fL (ref 80.0–100.0)
Monocytes Absolute: 0.6 10*3/uL (ref 0.1–1.0)
Monocytes Relative: 9 %
Neutro Abs: 4.6 10*3/uL (ref 1.7–7.7)
Neutrophils Relative %: 70 %
Platelet Count: 198 10*3/uL (ref 150–400)
RBC: 4.32 MIL/uL (ref 4.22–5.81)
RDW: 14.7 % (ref 11.5–15.5)
WBC Count: 6.6 10*3/uL (ref 4.0–10.5)
nRBC: 0 % (ref 0.0–0.2)

## 2022-10-18 NOTE — Progress Notes (Signed)
HEMATOLOGY/ONCOLOGY CLINIC NOTE  Date of Service:  10/18/22   Patient Care Team: Joaquim Nam, MD as PCP - General (Family Medicine) Tonny Bollman, MD as PCP - Cardiology (Cardiology) Laurey Morale, MD as Consulting Physician (Cardiology) Kennon Rounds as Physician Assistant (Cardiology) Cherlyn Cushing, RN as Oncology Nurse Navigator [name], [name], NP as Nurse Practitioner (Nurse Practitioner) [name], MD as Consulting Physician (Urology)  CHIEF COMPLAINTS/PURPOSE OF CONSULTATION:   Malignant neoplasm of prostate metastatic to intra-abdominal lymph node [facility])   Prior Therapy: CT-guided biopsy of the left pelvic lymph nodes on June 02, 2021 showed metastatic carcinoma staining positive for PSA.   Current therapy:   Eligard 45 mg every 6 months started in June 2023.  He is currently receiving that under the care of [facility].   Zytiga 1000 mg with prednisone 5 mg started on August 03, 2021.  His dose was reduced to 500 mg daily dose starting in September 2023.  HISTORY OF PRESENTING ILLNESS:   [name] is a wonderful 89 y.o. male who is here for continued evaluation and management of Malignant neoplasm of prostate metastatic to intra-abdominal lymph node (HCC). Patient was getting followed up by [name]. Patient is accompanied by his daughter during this visit.   Patient was initially diagnosed with advanced prostate cancer in May 2023. He also had castration-sensitive with lymphadenopathy and bone disease, PSA with 24 at the time of diagnosis. He was also previously diagnosed with superficial bladder tumor in 2012 and developed relapsed disease in May 2023 with [facility].  Patient's current treatment includes Eligard 45 mg every 6 months which was started in June 2023 and Zytiga 500 mg with Prednisone 5 mg. He previously on Zytiga 1000 mg from August 03, 2021 to September 2023. Reduced to 500 mg in September 2023.   Patient was last seen  by [name] on 01/18/2022 and he was doing well overall. He denied of any toxicities with Zytiga after reducing the dosage. During the last visit, Foley catheter was removed.   He reports he has been doing well overall since his last visit with [name]. He denies fever, chills, night sweats, new lumps/bumps, urinary problems, abnormal bowel movement, unexpected weight loss, bone pain, abdominal pain, chest pain, back pain. However, he does complain of increased fatigue, occasional bilateral leg swelling, occasional shortness of breath, and occasional constipation. His fatigue is affecting his daily activities, such as getting tired making breakfast. He notes that his fatigue causes bilateral leg weakness.  Patient's daughter notes that the patient recently had sinus infection since the last visit. He has recovered well from the infection. His daughter also reports that the patient has decreased appetite, but his weight has been stable.   He has dentures and no other dental issues.   He is complaint with all of his medications.   Patient's daughter notes that he has a blister on his right feet sole, which is affecting his walking and movement.   INTERVAL HISTORY:  [name] is a wonderful 89 y.o. male who is here for continued evaluation and management of Malignant neoplasm of prostate metastatic to intra-abdominal lymph node (HCC).   Patient was last seen by me on 07/19/2022 and he complained of lethargy, fatigue, congestion, and occasional shortness of breath. He attributed his SOB and nasal congestion to allergies.   Patient is accompanied by his family members during this visit. He complains of worsened fatigue, lethargy, insomnia, nasal congestion,  and SOB. Patient notes he only gets around 3-4 hours of sleep at night. Denies cough. He has not been to his PCP regarding SOB. He notes that his breathing improves when he seats up and worsens when he lays down.   Patient notes that his  leg swelling has improved since our last visit. He denies being on water pills.   He regularly follows-up with his Urologist for bladder treatment.   He denies any recent infection issues, fever, chills, night sweats, abdominal pain, back pain, bone pain, or abnormal bowel movement.   Patient's PCP is managing his Revatio medication for his pulmonary hypertension.   MEDICAL HISTORY:  Past Medical History:  Diagnosis Date   AAA (abdominal aortic aneurysm) (HCC)    s/p stent graft repair 2012   Arthritis    "right ankle" (04/17/2014)   Ascending aorta dilatation (HCC)    Echo 2021: 42 mm // Echocardiogram 10/22: EF 55-60, no RWMA, mildly reduced RVSF, RVSP 39.7, severe BAE, trivial MR, mod TR, trivial AI, mild AS (mean 8), ascending aorta 40 mm   Atrial fibrillation (HCC)    a. permanent;  b. Hematuria on Pradaxa;  c. cold intol and chills with Xarelto   Bladder cancer (HCC)    Cardiomyopathy (HCC)    a. Echo (03/2013): Inferior and inferolateral AK, EF 40-45%, mild MR, mod LAE, mod RAE, mod TR, mild PI, PASP 46   Chronic kidney disease    COPD (chronic obstructive pulmonary disease) (HCC)    Coronary artery disease s/p cabg x7  1992   a. s/p CABG in 1992 (X 7 - Dr. Andrey Campanile);  b.  Myoview (01/2011): Fixed inferior defect suggestive of prior MI versus diaphragmatic attenuation, apical reversibility possibly suspicious for small area of infarction with peri-infarct ischemia, no significant change from prior study, not gated, low risk study;  c.  Echo (05/2010): Mod LVH, EF 55-65%, mild AI, mild MR, moderate LAE, mild RAE, moderate    Hematuria bladder tumor ---  followed by Dr Vernie Ammons   Hiatal hernia    repaired in 1992 w/[facility]   Hx of cardiovascular stress test    Lexiscan Myoview (03/2013):  No ischemia; not gated.   Hyperglycemia diet controlled   Hyperlipidemia    Hypertension    Hypothyroidism    Obesity    Organic impotence    Prostate cancer (HCC)    RBBB    S/P AAA repair using  bifurcation graft    Sleep apnea no rx cpap   mild- tested  2012   Ventricular ectopy    mild    SURGICAL HISTORY: Past Surgical History:  Procedure Laterality Date   ABDOMINAL AORTIC ANEURYSM REPAIR  02/28/2010   using bifurcation graft   CARDIAC CATHETERIZATION  03/31/1990   Positive   CARDIOVASCULAR STRESS TEST  02-01-2011  NUCLEAR NO EXERCISE   LOW RISK STUDY.  SMALL APICAL DEFECT. NO EVIDENCE OF MULTIZONE ISCHEMIA.   CORONARY ARTERY BYPASS GRAFT  05/30/1990   CABG X 7   CYSTOSCOPY N/A 01/07/2022   Procedure: CYSTOSCOPY, BLADDER BIOPSY WITH FULGERATION BILATERAL RETROGRADE PYELOGRAM;  Surgeon: [name], MD;  Location: [facility];  Service: Urology;  Laterality: N/A;  45 MINUTES NEEDED FOR CASE  INTUBATED PARALYZED   CYSTOSCOPY WITH BIOPSY N/A 07/23/2021   Procedure: CYSTOSCOPY WITH  BLADDER BIOPSY AND BILATERAL RETROGRADE PYELOGRAM;  Surgeon: [name], MD;  Location: [facility];  Service: Urology;  Laterality: N/A;   ENDOVASCULAR STENT INSERTION  02/15/2011   Procedure:  ENDOVASCULAR STENT GRAFT INSERTION;  Surgeon: Nilda Simmer, MD;  Location: [facility] OR;  Service: Vascular;  Laterality: N/A;  Insertion of endovascular stent graft    ETT  10/31/2000   [facility]   HIATAL HERNIA REPAIR  02/28/1990   "repaired when I had my OHS"   Hospital - Afib  03/28/2002   INGUINAL HERNIA REPAIR Left 03/01/1995   TOTAL ANKLE ARTHROPLASTY Right 04/17/2014   Procedure: RIGHT TOTAL ANKLE ARTHOPLASTY;  Surgeon: Toni Arthurs, MD;  Location: [facility] OR;  Service: Orthopedics;  Laterality: Right;   TRANSURETHRAL RESECTION OF BLADDER TUMOR  03/25/2011   Procedure: TRANSURETHRAL RESECTION OF BLADDER TUMOR (TURBT);  Surgeon: Garnett Farm, MD;  Location: [facility];  Service: Urology;  Laterality: N/A;    SOCIAL HISTORY: Social History   Socioeconomic History   Marital status: Widowed    Spouse name: Not on file   Number of children: Not on file   Years of education: Not on file    Highest education level: Not on file  Occupational History   Occupation: retired Water quality scientist.    Employer: retired    Comment: 2001. Likes to play golf . Travels with motor home  Tobacco Use   Smoking status: Former    Current packs/day: 0.00    Average packs/day: 1.3 packs/day for 30.0 years (37.5 ttl pk-yrs)    Types: Cigarettes    Start date: 02/29/1960    Quit date: 02/28/1990    Years since quitting: 32.6   Smokeless tobacco: Never  Vaping Use   Vaping status: Never Used  Substance and Sexual Activity   Alcohol use: No    Alcohol/week: 0.0 standard drinks of alcohol    Comment: basically no alcohol use.  only very rare alcohol use.    Drug use: No   Sexual activity: Yes  Other Topics Concern   Not on file  Social History Narrative   From [place]   Lives with girlfriend.     Divorced, remarried then widowed, 2 children alive- (prev with 3 children- from 1st marriage)   Likes to golf   Prev 4 sport athlete in HS, football fan.    Social Determinants of Health   Financial Resource Strain: Low Risk  (12/09/2021)   Overall Financial Resource Strain (CARDIA)    Difficulty of Paying Living Expenses: Not hard at all  Food Insecurity: No Food Insecurity (12/09/2021)   Hunger Vital Sign    Worried About Running Out of Food in the Last Year: Never true    Ran Out of Food in the Last Year: Never true  Transportation Needs: No Transportation Needs (12/09/2021)   PRAPARE - Administrator, Civil Service (Medical): No    Lack of Transportation (Non-Medical): No  Physical Activity: Inactive (12/09/2021)   Exercise Vital Sign    Days of Exercise per Week: 0 days    Minutes of Exercise per Session: 0 min  Stress: No Stress Concern Present (12/09/2021)   [employer] of Occupational Health - Occupational Stress Questionnaire    Feeling of Stress : Not at all  Social Connections: Socially Isolated (12/09/2021)   Social Connection and Isolation Panel  [NHANES]    Frequency of Communication with Friends and Family: Three times a week    Frequency of Social Gatherings with Friends and Family: Once a week    Attends Religious Services: Never    Database administrator or Organizations: No    Attends Banker Meetings:  Never    Marital Status: Widowed  Intimate Partner Violence: Not At Risk (12/09/2021)   Humiliation, Afraid, Rape, and Kick questionnaire    Fear of Current or Ex-Partner: No    Emotionally Abused: No    Physically Abused: No    Sexually Abused: No    FAMILY HISTORY: Family History  Problem Relation Age of Onset   Obesity Mother 51       deceased   Hypertension Mother    Diabetes Mother    Heart disease Mother        before age 32   Hyperlipidemia Mother    Alcohol abuse Sister 80       deceased   Other Father        deceased unknown   Colon cancer Neg Hx    Prostate cancer Neg Hx    Anesthesia problems Neg Hx     ALLERGIES:  is allergic to dabigatran etexilate mesylate, xarelto [rivaroxaban], and metoprolol.  MEDICATIONS:  Current Outpatient Medications  Medication Sig Dispense Refill   abiraterone acetate (ZYTIGA) 250 MG tablet Take 2 tablets (500 mg total) by mouth daily. Take on an empty stomach 1 hour before or 2 hours after a meal 60 tablet 0   apixaban (ELIQUIS) 5 MG TABS tablet TAKE 1 TABLET BY MOUTH DAILY     atorvastatin (LIPITOR) 40 MG tablet TAKE ONE TABLET BY MOUTH ONCE A DAY 90 tablet 1   docusate sodium (COLACE) 100 MG capsule Take 1 capsule (100 mg total) by mouth daily as needed for up to 30 doses. 30 capsule 0   finasteride (PROSCAR) 5 MG tablet Take 5 mg by mouth daily.     levothyroxine (SYNTHROID) 137 MCG tablet TAKE ONE TABLET BY MOUTH ONCE A DAY ON AN EMPTY STOMACH 90 tablet 2   predniSONE (DELTASONE) 5 MG tablet Take 1 tablet (5 mg total) by mouth daily with breakfast. 90 tablet 1   ramipril (ALTACE) 5 MG capsule TAKE ONE CAPSULE BY MOUTH TWO TIMES DAILY 180 capsule 0    sildenafil (REVATIO) 20 MG tablet TAKE 3 TO 5 TABLETS BY MOUTH DAILY AS NEEDED (Patient not taking: Reported on 07/19/2022) 50 tablet 12   tamsulosin (FLOMAX) 0.4 MG CAPS capsule Take 0.4 mg by mouth at bedtime.     No current facility-administered medications for this visit.    REVIEW OF SYSTEMS:    10 Point review of Systems was done is negative except as noted above.  PHYSICAL EXAMINATION: ECOG PERFORMANCE STATUS: 2 - Symptomatic, <50% confined to bed  . Vitals:   10/18/22 1230  BP: (!) 141/65  Pulse: 72  Resp: 18  Temp: 98.2 F (36.8 C)  SpO2: 98%    Filed Weights   10/18/22 1230  Weight: 266 lb 11.2 oz (121 kg)    .Body mass index is 37.2 kg/m.  GENERAL:alert, in no acute distress and comfortable SKIN: no acute rashes, no significant lesions EYES: conjunctiva are pink and non-injected, sclera anicteric OROPHARYNX: MMM, no exudates, no oropharyngeal erythema or ulceration NECK: supple, no JVD LYMPH:  no palpable lymphadenopathy in the cervical, axillary or inguinal regions LUNGS: clear to auscultation b/l with normal respiratory effort HEART: regular rate & rhythm ABDOMEN:  normoactive bowel sounds , non tender, not distended. Extremity: no pedal edema PSYCH: alert & oriented x 3 with fluent speech NEURO: no focal motor/sensory deficits  LABORATORY DATA:  I have reviewed the data as listed .    Latest Ref Rng & Units 10/18/2022  11:50 AM 07/19/2022   11:53 AM 04/19/2022   12:34 PM  CBC  WBC 4.0 - 10.5 K/uL 6.6  6.7  7.3   Hemoglobin 13.0 - 17.0 g/dL 53.6  64.4  03.4   Hematocrit 39.0 - 52.0 % 39.1  41.2  40.0   Platelets 150 - 400 K/uL 198  174  194    .    Latest Ref Rng & Units 10/18/2022   11:50 AM 07/19/2022   11:53 AM 04/19/2022   12:34 PM  CMP  Glucose 70 - 99 mg/dL 742  595  638   BUN 8 - 23 mg/dL 15  10  11    Creatinine 0.61 - 1.24 mg/dL 7.56  4.33  2.95   Sodium 135 - 145 mmol/L 140  140  141   Potassium 3.5 - 5.1 mmol/L 3.7  3.9  3.2    Chloride 98 - 111 mmol/L 104  105  106   CO2 22 - 32 mmol/L 30  29  29    Calcium 8.9 - 10.3 mg/dL 9.0  9.0  9.0   Total Protein 6.5 - 8.1 g/dL 6.9  6.7  6.9   Total Bilirubin 0.3 - 1.2 mg/dL 0.9  0.9  0.8   Alkaline Phos 38 - 126 U/L 88  96  100   AST 15 - 41 U/L 11  12  13    ALT 0 - 44 U/L 9  9  10      RADIOGRAPHIC STUDIES: I have personally reviewed the radiological images as listed and agreed with the findings in the report. No results found.  ASSESSMENT & PLAN:   1.  Castration-sensitive advanced prostate cancer with lymphadenopathy diagnosed in May 2023.    2.  Androgen deprivation: He will continue to receive Eligard every 6 months under the care of Dr. Cardell Peach and urology. Was last given in December 2023.    3.  Bone directed therapy: He is to continue calcium and vitamin D supplements.    4.  Bladder cancer: He continues to follow with urology regarding this issue.  He has carcinoma in situ on last cystoscopy.  5.  Hypertension: His blood pressure is elevated today and normally is during doctor's visits.  Ambulatory blood pressure monitoring per his report are low are close to normal range.  I have asked him to check his blood pressure periodically and address that with his primary care physician if he is continue to have elevated blood pressure at home.    6.  Urinary retention: Resolved and his catheter has been removed.  Continues to follow with Dr. Cardell Peach regarding this issue.   PLAN: -Discussed lab results from today, 10/18/2022, with the patient. CBC shows slightly decreased hemoglobin at 12.6 g/dL, but stable overall. CMP shows elevated glucose level at 192 and decreased AST level at 11.  -recommend to follow-up with PCP regarding SOB.  -Discussed with the patient that fatigue/lethargy is due to other medical problems, including SOB and insomnia.  -Discussed with the patient that fatigue could also be due to [facility]. Discussed the option of discontinuing Zytiga or taking 1  Zytiga medication to see if it helps his fatigue.  -Patient wants to continue with 1 Zytiga medication, instead of 2 . -Will prescribe water pill.  -Discussed the option of chest X-ray after this visit. Pt agrees.  -Discussed the option of EKG during this visit. Patient agrees.  -Recommended to visit Urologist regarding toxicities with bladder treatment.  -Answered all of patient's questions.  -  Recommend to wear compression socks to reduce leg swelling.      FOLLOW-UP: EKG today CXR today RTC with Dr Candise Che with labs in 2 months F/u with PCP in 1-2 weeks   The total time spent in the appointment was 30 minutes* .  All of the patient's questions were answered with apparent satisfaction. The patient knows to call the clinic with any problems, questions or concerns.   Wyvonnia Lora MD MS AAHIVMS [facility] Hematology/Oncology Physician [facility]  .*Total Encounter Time as defined by the Centers for Medicare and Medicaid Services includes, in addition to the face-to-face time of a patient visit (documented in the note above) non-face-to-face time: obtaining and reviewing outside history, ordering and reviewing medications, tests or procedures, care coordination (communications with other health care professionals or caregivers) and documentation in the medical record.   I,Param Shah,acting as a Neurosurgeon for Wyvonnia Lora, MD.,have documented all relevant documentation on the behalf of Wyvonnia Lora, MD,as directed by  Wyvonnia Lora, MD while in the presence of Wyvonnia Lora, MD.  .I have reviewed the above documentation for accuracy and completeness, and I agree with the above.  Johney Maine MD

## 2022-10-19 LAB — PROSTATE-SPECIFIC AG, SERUM (LABCORP): Prostate Specific Ag, Serum: <0.1 ng/mL (ref 0.0–4.0)

## 2022-10-20 ENCOUNTER — Other Ambulatory Visit: Payer: Self-pay | Admitting: Hematology

## 2022-10-20 MED ORDER — FUROSEMIDE 20 MG PO TABS
20.0000 mg | ORAL_TABLET | Freq: Two times a day (BID) | ORAL | 0 refills | Status: DC
Start: 2022-10-20 — End: 2022-10-28

## 2022-10-26 ENCOUNTER — Other Ambulatory Visit (HOSPITAL_COMMUNITY): Payer: Self-pay

## 2022-10-27 ENCOUNTER — Other Ambulatory Visit (HOSPITAL_COMMUNITY): Payer: Self-pay

## 2022-10-28 ENCOUNTER — Encounter: Payer: Self-pay | Admitting: Family Medicine

## 2022-10-28 ENCOUNTER — Ambulatory Visit: Payer: Medicare Other | Admitting: Family Medicine

## 2022-10-28 VITALS — BP 122/72 | HR 50 | Temp 97.7°F | Ht 71.0 in | Wt 257.0 lb

## 2022-10-28 DIAGNOSIS — R0602 Shortness of breath: Secondary | ICD-10-CM | POA: Diagnosis not present

## 2022-10-28 DIAGNOSIS — C772 Secondary and unspecified malignant neoplasm of intra-abdominal lymph nodes: Secondary | ICD-10-CM | POA: Diagnosis not present

## 2022-10-28 DIAGNOSIS — C61 Malignant neoplasm of prostate: Secondary | ICD-10-CM

## 2022-10-28 LAB — BASIC METABOLIC PANEL WITH GFR
BUN: 17 mg/dL (ref 6–23)
CO2: 26 meq/L (ref 19–32)
Calcium: 9.4 mg/dL (ref 8.4–10.5)
Chloride: 104 meq/L (ref 96–112)
Creatinine, Ser: 0.94 mg/dL (ref 0.40–1.50)
GFR: 73.31 mL/min
Glucose, Bld: 128 mg/dL — ABNORMAL HIGH (ref 70–99)
Potassium: 3.5 meq/L (ref 3.5–5.1)
Sodium: 140 meq/L (ref 135–145)

## 2022-10-28 LAB — BRAIN NATRIURETIC PEPTIDE: Pro B Natriuretic peptide (BNP): 328 pg/mL — ABNORMAL HIGH (ref 0.0–100.0)

## 2022-10-28 MED ORDER — FUROSEMIDE 20 MG PO TABS
20.0000 mg | ORAL_TABLET | Freq: Every day | ORAL | Status: DC
Start: 2022-10-28 — End: 2022-12-23

## 2022-10-28 MED ORDER — ABIRATERONE ACETATE 250 MG PO TABS
250.0000 mg | ORAL_TABLET | Freq: Every day | ORAL | Status: DC
Start: 2022-10-28 — End: 2022-11-24

## 2022-10-28 NOTE — Progress Notes (Unsigned)
Pulse has been 50-90 on home check.  48 on recheck at OV today.    Recent started lasix daily, instead of twice a day.  Less ankle edema in the meantime.  Breathing is better.  Less nocturia.   Swelling wasn't new but had been worse recently.   No CP.    No fevers.  No cough. no sputum.   ======================  D/w pt about prev imaging.   IMPRESSION: Reticulonodular opacities in the lower lungs, potentially bronchitis and/or atypical infection.   New nodule/mass in the right upper lung when compared to the prior plain film. This appears more pronounced than the prior PET-CT. Chest CT is indicated given the patient's history.  Meds, vitals, and allergies reviewed.   ROS: Per HPI unless specifically indicated in ROS section    Huston Foley but regular Ctab Trace BLE

## 2022-10-28 NOTE — Patient Instructions (Addendum)
Go to the lab on the way out.   If you have mychart we'll likely use that to update you.    Don't change your meds for now.  I'll update Dr. Kirtland Bouchard.  Take care.  Glad to see you.   Check your AM weight.  If gaining 3 lbs, then let us know.   If lightheaded, skip lasix that day.

## 2022-10-31 DIAGNOSIS — R0602 Shortness of breath: Secondary | ICD-10-CM | POA: Insufficient documentation

## 2022-10-31 NOTE — Assessment & Plan Note (Signed)
No change in medications for now.  The concern is for CHF/fluid retention in the setting of relative bradycardia, which could be compounded by relative bradycardia.  See above regarding potentially seeing cardiology.  He wanted to defer for now. I'll update Dr. Candise Che.  His swelling is better in the meantime with Lasix use.  Would continue as is. I asked him to check his AM weight.  If gaining 3 lbs, then let us know.   If lightheaded, skip lasix that day.

## 2022-11-01 ENCOUNTER — Telehealth: Payer: Self-pay | Admitting: Family Medicine

## 2022-11-01 DIAGNOSIS — R0602 Shortness of breath: Secondary | ICD-10-CM

## 2022-11-01 NOTE — Telephone Encounter (Signed)
Patient daughter would like to know if a referral can be sent out for patient to see Dr cooper cardiologist. She said that he usually sees [facility] they really need to see the doctor. She said that he has been dealing with sob,and fluid around his heart that he has already been seen by Dr Para March For.Patient is also requesting a phone from Dr Para March

## 2022-11-01 NOTE — Telephone Encounter (Signed)
Called pt.  D/w pt about options re: cardiology, would prefer Dr. Excell Seltzer or Tereso Newcomer if possible.  Referral placed.    Less SOB, no BLE edema now- taking 1 lasix a day.  Routine cautions d/w pt.  He can update me as needed.  He is going to update daughter that I am working on referral.

## 2022-11-16 ENCOUNTER — Encounter: Payer: Self-pay | Admitting: Cardiovascular Disease

## 2022-11-16 ENCOUNTER — Ambulatory Visit (INDEPENDENT_AMBULATORY_CARE_PROVIDER_SITE_OTHER): Payer: Medicare Other

## 2022-11-16 ENCOUNTER — Ambulatory Visit: Payer: Medicare Other | Attending: Cardiovascular Disease | Admitting: Cardiovascular Disease

## 2022-11-16 VITALS — BP 150/90 | HR 74 | Ht 71.0 in | Wt 257.4 lb

## 2022-11-16 DIAGNOSIS — R0602 Shortness of breath: Secondary | ICD-10-CM

## 2022-11-16 DIAGNOSIS — I10 Essential (primary) hypertension: Secondary | ICD-10-CM

## 2022-11-16 DIAGNOSIS — I5032 Chronic diastolic (congestive) heart failure: Secondary | ICD-10-CM | POA: Diagnosis not present

## 2022-11-16 DIAGNOSIS — I2510 Atherosclerotic heart disease of native coronary artery without angina pectoris: Secondary | ICD-10-CM | POA: Diagnosis not present

## 2022-11-16 DIAGNOSIS — E782 Mixed hyperlipidemia: Secondary | ICD-10-CM

## 2022-11-16 DIAGNOSIS — I4821 Permanent atrial fibrillation: Secondary | ICD-10-CM | POA: Diagnosis not present

## 2022-11-16 NOTE — Progress Notes (Signed)
Cardiology Office Note:    Date:  11/16/2022   ID:  [name], DOB 1933-06-10, MRN [id number]  PCP:  Joaquim Nam, MD   [facility] Providers Cardiologist:  Tonny Bollman, MD Cardiology APP:  Kennon Rounds     Referring MD: Joaquim Nam, MD   Chief Complaint  Patient presents with   Coronary Artery Disease    History of Present Illness:    [name] is a 89 y.o. male with a hx of:  Coronary artery disease S/p CABG in 1992 Myoview 7/21: low risk  Systolic CHF Ischemic CM Echocardiogram 2/15: EF 40-45 Echocardiogram 7/21: EF 60-65 Permanent atrial fibrillation  CHA2DS2-VASc=5 (age x 2, HTN, CAD, CHF) >> Apixaban   Abdominal aortic aneurysm s/p EVAR Chronic kidney disease RBBB COPD Hypertension Hyperlipidemia  Dilated ascending aorta  Echocardiogram 7/21: 42 mm   The patient is here with his son and his daughter today.  He has metastatic prostate cancer and is on oral chemotherapy.  He complains of pretty marked fatigue.  He also complains of leg swelling, shortness of breath, and orthopnea.  He has not had PND.  He denies chest pain or pressure.  He started taking furosemide 20 mg daily several weeks ago and has noticed significant improvement in his symptoms.  The patient has not had syncope or presyncope, but he does complain of marked fatigue.  He reportedly has had heart rates as low as the 30s based on his home monitoring.  He has no other complaints at this time.  Past Medical History:  Diagnosis Date   AAA (abdominal aortic aneurysm) (HCC)    s/p stent graft repair 2012   Arthritis    "right ankle" (04/17/2014)   Ascending aorta dilatation (HCC)    Echo 2021: 42 mm // Echocardiogram 10/22: EF 55-60, no RWMA, mildly reduced RVSF, RVSP 39.7, severe BAE, trivial MR, mod TR, trivial AI, mild AS (mean 8), ascending aorta 40 mm   Atrial fibrillation (HCC)    a. permanent;  b. Hematuria on Pradaxa;  c. cold intol and chills with  Xarelto   Bladder cancer (HCC)    Cardiomyopathy (HCC)    a. Echo (03/2013): Inferior and inferolateral AK, EF 40-45%, mild MR, mod LAE, mod RAE, mod TR, mild PI, PASP 46   Chronic kidney disease    COPD (chronic obstructive pulmonary disease) (HCC)    Coronary artery disease s/p cabg x7  1992   a. s/p CABG in 1992 (X 7 - Dr. Andrey Campanile);  b.  Myoview (01/2011): Fixed inferior defect suggestive of prior MI versus diaphragmatic attenuation, apical reversibility possibly suspicious for small area of infarction with peri-infarct ischemia, no significant change from prior study, not gated, low risk study;  c.  Echo (05/2010): Mod LVH, EF 55-65%, mild AI, mild MR, moderate LAE, mild RAE, moderate    Hematuria bladder tumor ---  followed by Dr Vernie Ammons   Hiatal hernia    repaired in 1992 w/[facility]   Hx of cardiovascular stress test    Lexiscan Myoview (03/2013):  No ischemia; not gated.   Hyperglycemia diet controlled   Hyperlipidemia    Hypertension    Hypothyroidism    Obesity    Organic impotence    Prostate cancer (HCC)    RBBB    S/P AAA repair using bifurcation graft    Sleep apnea no rx cpap   mild- tested  2012   Ventricular ectopy    mild  Past Surgical History:  Procedure Laterality Date   ABDOMINAL AORTIC ANEURYSM REPAIR  02/28/2010   using bifurcation graft   CARDIAC CATHETERIZATION  03/31/1990   Positive   CARDIOVASCULAR STRESS TEST  02-01-2011  NUCLEAR NO EXERCISE   LOW RISK STUDY.  SMALL APICAL DEFECT. NO EVIDENCE OF MULTIZONE ISCHEMIA.   CORONARY ARTERY BYPASS GRAFT  05/30/1990   CABG X 7   CYSTOSCOPY N/A 01/07/2022   Procedure: CYSTOSCOPY, BLADDER BIOPSY WITH FULGERATION BILATERAL RETROGRADE PYELOGRAM;  Surgeon: [name], MD;  Location: [facility];  Service: Urology;  Laterality: N/A;  45 MINUTES NEEDED FOR CASE  INTUBATED PARALYZED   CYSTOSCOPY WITH BIOPSY N/A 07/23/2021   Procedure: CYSTOSCOPY WITH  BLADDER BIOPSY AND BILATERAL RETROGRADE PYELOGRAM;  Surgeon: [name], MD;  Location: [facility];  Service: Urology;  Laterality: N/A;   ENDOVASCULAR STENT INSERTION  02/15/2011   Procedure: ENDOVASCULAR STENT GRAFT INSERTION;  Surgeon: Nilda Simmer, MD;  Location: [facility] OR;  Service: Vascular;  Laterality: N/A;  Insertion of endovascular stent graft    ETT  10/31/2000   WNL   HIATAL HERNIA REPAIR  02/28/1990   "repaired when I had my OHS"   Hospital - Afib  03/28/2002   INGUINAL HERNIA REPAIR Left 03/01/1995   TOTAL ANKLE ARTHROPLASTY Right 04/17/2014   Procedure: RIGHT TOTAL ANKLE ARTHOPLASTY;  Surgeon: Toni Arthurs, MD;  Location: [facility] OR;  Service: Orthopedics;  Laterality: Right;   TRANSURETHRAL RESECTION OF BLADDER TUMOR  03/25/2011   Procedure: TRANSURETHRAL RESECTION OF BLADDER TUMOR (TURBT);  Surgeon: Garnett Farm, MD;  Location: [facility];  Service: Urology;  Laterality: N/A;    Current Medications: Current Meds  Medication Sig   abiraterone acetate (ZYTIGA) 250 MG tablet Take 1 tablet (250 mg total) by mouth daily. Take on an empty stomach 1 hour before or 2 hours after a meal   apixaban (ELIQUIS) 5 MG TABS tablet TAKE 1 TABLET BY MOUTH DAILY   atorvastatin (LIPITOR) 40 MG tablet TAKE ONE TABLET BY MOUTH ONCE A DAY   docusate sodium (COLACE) 100 MG capsule Take 1 capsule (100 mg total) by mouth daily as needed for up to 30 doses.   finasteride (PROSCAR) 5 MG tablet Take 5 mg by mouth daily.   furosemide (LASIX) 20 MG tablet Take 1 tablet (20 mg total) by mouth daily.   levothyroxine (SYNTHROID) 137 MCG tablet TAKE ONE TABLET BY MOUTH ONCE A DAY ON AN EMPTY STOMACH   predniSONE (DELTASONE) 5 MG tablet Take 1 tablet (5 mg total) by mouth daily with breakfast.   ramipril (ALTACE) 5 MG capsule TAKE ONE CAPSULE BY MOUTH TWO TIMES DAILY   tamsulosin (FLOMAX) 0.4 MG CAPS capsule Take 0.4 mg by mouth at bedtime.     Allergies:   Dabigatran etexilate mesylate, Xarelto [rivaroxaban], and Metoprolol   Social History    Socioeconomic History   Marital status: Widowed    Spouse name: Not on file   Number of children: Not on file   Years of education: Not on file   Highest education level: Not on file  Occupational History   Occupation: retired Water quality scientist.    Employer: retired    Comment: 2001. Likes to play golf . Travels with motor home  Tobacco Use   Smoking status: Former    Current packs/day: 0.00    Average packs/day: 1.3 packs/day for 30.0 years (37.5 ttl pk-yrs)    Types: Cigarettes    Start date: 02/29/1960  Quit date: 02/28/1990    Years since quitting: 32.7   Smokeless tobacco: Never  Vaping Use   Vaping status: Never Used  Substance and Sexual Activity   Alcohol use: No    Alcohol/week: 0.0 standard drinks of alcohol    Comment: basically no alcohol use.  only very rare alcohol use.    Drug use: No   Sexual activity: Yes  Other Topics Concern   Not on file  Social History Narrative   From [place]   Lives with girlfriend.     Divorced, remarried then widowed, 2 children alive- (prev with 3 children- from 1st marriage)   Likes to golf   Prev 4 sport athlete in HS, football fan.    Social Determinants of Health   Financial Resource Strain: Low Risk  (12/09/2021)   Overall Financial Resource Strain (CARDIA)    Difficulty of Paying Living Expenses: Not hard at all  Food Insecurity: No Food Insecurity (12/09/2021)   Hunger Vital Sign    Worried About Running Out of Food in the Last Year: Never true    Ran Out of Food in the Last Year: Never true  Transportation Needs: No Transportation Needs (12/09/2021)   PRAPARE - Administrator, Civil Service (Medical): No    Lack of Transportation (Non-Medical): No  Physical Activity: Inactive (12/09/2021)   Exercise Vital Sign    Days of Exercise per Week: 0 days    Minutes of Exercise per Session: 0 min  Stress: No Stress Concern Present (12/09/2021)   [employer] of Occupational Health - Occupational  Stress Questionnaire    Feeling of Stress : Not at all  Social Connections: Socially Isolated (12/09/2021)   Social Connection and Isolation Panel [NHANES]    Frequency of Communication with Friends and Family: Three times a week    Frequency of Social Gatherings with Friends and Family: Once a week    Attends Religious Services: Never    Database administrator or Organizations: No    Attends Banker Meetings: Never    Marital Status: Widowed     Family History: The patient's family history includes Alcohol abuse (age of onset: 72) in his sister; Diabetes in his mother; Heart disease in his mother; Hyperlipidemia in his mother; Hypertension in his mother; Obesity (age of onset: 82) in his mother; Other in his father. There is no history of Colon cancer, Prostate cancer, or Anesthesia problems.  ROS:   Please see the history of present illness.    All other systems reviewed and are negative.  EKGs/Labs/Other Studies Reviewed:    The following studies were reviewed today: Echo 11/2020: 1. Left ventricular ejection fraction, by estimation, is 55 to 60%. The  left ventricle has normal function. The left ventricle has no regional  wall motion abnormalities. Left ventricular diastolic function could not  be evaluated.   2. Right ventricular systolic function is mildly reduced. The right  ventricular size is normal. There is mildly elevated pulmonary artery  systolic pressure.   3. Left atrial size was severely dilated.   4. Right atrial size was severely dilated.   5. The mitral valve is normal in structure. Trivial mitral valve  regurgitation. No evidence of mitral stenosis.   6. Tricuspid valve regurgitation is moderate.   7. The aortic valve is tricuspid. There is moderate calcification of the  aortic valve. Aortic valve regurgitation is trivial. Mild aortic valve  stenosis.   8. Aortic dilatation noted. There is  mild dilatation of the ascending  aorta, measuring 40  mm.   9. The inferior vena cava is normal in size with greater than 50%  respiratory variability, suggesting right atrial pressure of 3 mmHg.      Myoview Scan 08/2019: The left ventricular ejection fraction is normal (55-65%). Nuclear stress EF: 60%. Blood pressure was markedly elevated at baseline. There was no ST segment deviation noted during stress. The study is normal. This is a low risk study. Defect 1: There is a small defect of moderate severity present in the apical inferior location. This defect is fixed and likely due to diaphragmatic attenuatiion. There is no ischemia  Recent Labs: 05/13/2022: TSH 0.56 10/18/2022: ALT 9; Hemoglobin 12.6; Platelet Count 198 10/28/2022: BUN 17; Creatinine, Ser 0.94; Potassium 3.5; Pro B Natriuretic peptide (BNP) 328.0; Sodium 140  Recent Lipid Panel    Component Value Date/Time   CHOL 133 05/13/2022 1222   CHOL 151 09/24/2019 0850   TRIG 138.0 05/13/2022 1222   HDL 43.40 05/13/2022 1222   HDL 40 09/24/2019 0850   CHOLHDL 3 05/13/2022 1222   VLDL 27.6 05/13/2022 1222   LDLCALC 62 05/13/2022 1222   LDLCALC 81 09/24/2019 0850   LDLDIRECT 99.0 03/21/2017 1031     Risk Assessment/Calculations:    CHA2DS2-VASc Score = 5   This indicates a 7.2% annual risk of stroke. The patient's score is based upon: CHF History: 1 HTN History: 1 Diabetes History: 0 Stroke History: 0 Vascular Disease History: 1 Age Score: 2 Gender Score: 0           Physical Exam:    VS:  BP (!) 150/90 (BP Location: Left Arm, Patient Position: Sitting, Cuff Size: Large)   Pulse 74   Ht 5\' 11"  (1.803 m)   Wt 257 lb 6.4 oz (116.8 kg)   SpO2 97%   BMI 35.90 kg/m     Wt Readings from Last 3 Encounters:  11/16/22 257 lb 6.4 oz (116.8 kg)  10/28/22 257 lb (116.6 kg)  10/18/22 266 lb 11.2 oz (121 kg)     GEN:  Well nourished, well developed pleasant obese elderly gentleman in no acute distress HEENT: Normal NECK: No JVD; No carotid bruits LYMPHATICS:  No lymphadenopathy CARDIAC: Irregularly irregular, no murmurs, rubs, gallops RESPIRATORY:  Clear to auscultation without rales, wheezing or rhonchi  ABDOMEN: Soft, non-tender, non-distended MUSCULOSKELETAL: Trace bilateral pretibial edema; No deformity  SKIN: Warm and dry NEUROLOGIC:  Alert and oriented x 3 PSYCHIATRIC:  Normal affect   ASSESSMENT:    1. Permanent atrial fibrillation (HCC)   2. Coronary artery disease involving native coronary artery of native heart without angina pectoris   3. Essential hypertension   4. Mixed hyperlipidemia   5. Chronic diastolic heart failure (HCC)   6. Shortness of breath    PLAN:    In order of problems listed above:   I reviewed his nuclear scan from 2021 and his echocardiogram from 2022.  He has had normal LVEF and severe biatrial enlargement with no significant valvular disease.  He remains on apixaban for anticoagulation.  Heart rate has been highly variable and he reports some heart rate readings in the 30s.  I think he needs outpatient event monitoring.  Will place a 3-day ZIO monitor.  In general he prefers a conservative approach to his care.  We had some discussion about potential findings of the monitor and consideration of a pacemaker.  He would be open minded if he really needs something like  this done.  Will await his monitor results.  He is not on any AV nodal blockers. Appears clinically stable after remote CABG.  Continue apixaban, no antiplatelet therapy in the setting of chronic oral anticoagulation.  Treated with atorvastatin for lipid lowering. Blood pressure above goal.  Continue furosemide and ramipril at current doses.  Reluctant to intensify his antihypertensive medications with all of his issues going on at present. Treated with atorvastatin.  LDL cholesterol 62.  Continue current therapy. Continue low-dose furosemide as symptoms seem to be improving.  Could increase dose if needed.  He does not appear grossly volume overloaded  on exam today.  Consider SGLT2 inhibitor.  Check 2D echocardiogram and await result before further medicine recommendations. As above, will check 2D echocardiogram and 3-day ZIO monitor.  Continue current medical therapy for now.           Medication Adjustments/Labs and Tests Ordered: Current medicines are reviewed at length with the patient today.  Concerns regarding medicines are outlined above.  Orders Placed This Encounter  Procedures   LONG TERM MONITOR (3-14 DAYS)   ECHOCARDIOGRAM COMPLETE   No orders of the defined types were placed in this encounter.   Patient Instructions  Medication Instructions:  Your physician recommends that you continue on your current medications as directed. Please refer to the Current Medication list given to you today.  *If you need a refill on your cardiac medications before your next appointment, please call your pharmacy*   Lab Work: NONE If you have labs (blood work) drawn today and your tests are completely normal, you will receive your results only by: MyChart Message (if you have MyChart) OR A paper copy in the mail If you have any lab test that is abnormal or we need to change your treatment, we will call you to review the results.  Testing/Procedures: ECHO Your physician has requested that you have an echocardiogram. Echocardiography is a painless test that uses sound waves to create images of your heart. It provides your doctor with information about the size and shape of your heart and how well your heart's chambers and valves are working. This procedure takes approximately one hour. There are no restrictions for this procedure. Please do NOT wear cologne, perfume, aftershave, or lotions (deodorant is allowed). Please arrive 15 minutes prior to your appointment time.  3-day Zio monitor Your physician has recommended that you wear an event monitor. Event monitors are medical devices that record the heart's electrical activity.  Doctors most often [place] these monitors to diagnose arrhythmias. Arrhythmias are problems with the speed or rhythm of the heartbeat. The monitor is a small, portable device. You can wear one while you do your normal daily activities. This is usually used to diagnose what is causing palpitations/syncope (passing out).  Follow-Up: At [facility], you and your health needs are our priority.  As part of our continuing mission to provide you with exceptional heart care, we have created designated Provider Care Teams.  These Care Teams include your primary Cardiologist (physician) and Advanced Practice Providers (APPs -  Physician Assistants and Nurse Practitioners) who all work together to provide you with the care you need, when you need it.  Your next appointment:   6 month(s)  Provider:   Jari Favre, PA-C, Ronie Spies, PA-C, Eligha Bridegroom, NP, Tereso Newcomer, PA-C, or Perlie Gold, PA-C     Then, Tonny Bollman, MD will plan to see you again in 1 year(s).     Other Instructions  ZIO XT- Long Term Monitor Instructions  Your physician has requested you wear a ZIO patch monitor for 3 days.  This is a single patch monitor. [employer] supplies one patch monitor per enrollment. Additional stickers are not available. Please do not apply patch if you will be having a Nuclear Stress Test,  Echocardiogram, Cardiac CT, MRI, or Chest Xray during the period you would be wearing the  monitor. The patch cannot be worn during these tests. You cannot remove and re-apply the  ZIO XT patch monitor.  Your ZIO patch monitor will be mailed 3 day USPS to your address on file. It may take 3-5 days  to receive your monitor after you have been enrolled.  Once you have received your monitor, please review the enclosed instructions. Your monitor  has already been registered assigning a specific monitor serial # to you.  Billing and Patient Assistance Program Information  We have supplied [employer] with any of your  insurance information on file for billing purposes. [employer] offers a sliding scale Patient Assistance Program for patients that do not have  insurance, or whose insurance does not completely cover the cost of the ZIO monitor.  You must apply for the Patient Assistance Program to qualify for this discounted rate.  To apply, please call [employer] at [phone], select option 4, select option 2, ask to apply for  Patient Assistance Program. Meredeth Ide will ask your household income, and how many people  are in your household. They will quote your out-of-pocket cost based on that information.  [employer] will also be able to set up a 65-month, interest-free payment plan if needed.  Applying the monitor   Shave hair from upper left chest.  Hold abrader disc by orange tab. Rub abrader in 40 strokes over the upper left chest as  indicated in your monitor instructions.  Clean area with 4 enclosed alcohol pads. Let dry.  Apply patch as indicated in monitor instructions. Patch will be placed under collarbone on left  side of chest with arrow pointing upward.  Rub patch adhesive wings for 2 minutes. Remove white label marked "1". Remove the white  label marked "2". Rub patch adhesive wings for 2 additional minutes.  While looking in a mirror, press and release button in center of patch. A small green light will  flash 3-4 times. This will be your only indicator that the monitor has been turned on.  Do not shower for the first 24 hours. You may shower after the first 24 hours.  Press the button if you feel a symptom. You will hear a small click. Record Date, Time and  Symptom in the Patient Logbook.  When you are ready to remove the patch, follow instructions on the last 2 pages of Patient  Logbook. Stick patch monitor onto the last page of Patient Logbook.  Place Patient Logbook in the blue and white box. Use locking tab on box and tape box closed  securely. The blue and white box has prepaid postage on  it. Please place it in the mailbox as  soon as possible. Your physician should have your test results approximately 7 days after the  monitor has been mailed back to [facility].  Call [facility] Customer Care at [phone] if you have questions regarding  your ZIO XT patch monitor. Call them immediately if you see an orange light blinking on your  monitor.  If your monitor falls off in less than 4 days, contact our Monitor department at [phone].  If  your monitor becomes loose or falls off after 4 days call [employer] at [phone] for  suggestions on securing your monitor    Signed, Tonny Bollman, MD  11/16/2022 4:51 PM    [facility]

## 2022-11-16 NOTE — Patient Instructions (Signed)
Medication Instructions:  Your physician recommends that you continue on your current medications as directed. Please refer to the Current Medication list given to you today.  *If you need a refill on your cardiac medications before your next appointment, please call your pharmacy*   Lab Work: NONE If you have labs (blood work) drawn today and your tests are completely normal, you will receive your results only by: MyChart Message (if you have MyChart) OR A paper copy in the mail If you have any lab test that is abnormal or we need to change your treatment, we will call you to review the results.  Testing/Procedures: ECHO Your physician has requested that you have an echocardiogram. Echocardiography is a painless test that uses sound waves to create images of your heart. It provides your doctor with information about the size and shape of your heart and how well your heart's chambers and valves are working. This procedure takes approximately one hour. There are no restrictions for this procedure. Please do NOT wear cologne, perfume, aftershave, or lotions (deodorant is allowed). Please arrive 15 minutes prior to your appointment time.  3-day Zio monitor Your physician has recommended that you wear an event monitor. Event monitors are medical devices that record the heart's electrical activity. Doctors most often [place] these monitors to diagnose arrhythmias. Arrhythmias are problems with the speed or rhythm of the heartbeat. The monitor is a small, portable device. You can wear one while you do your normal daily activities. This is usually used to diagnose what is causing palpitations/syncope (passing out).  Follow-Up: At [facility], you and your health needs are our priority.  As part of our continuing mission to provide you with exceptional heart care, we have created designated Provider Care Teams.  These Care Teams include your primary Cardiologist (physician) and Advanced Practice  Providers (APPs -  Physician Assistants and Nurse Practitioners) who all work together to provide you with the care you need, when you need it.  Your next appointment:   6 month(s)  Provider:   Jari Favre, PA-C, Ronie Spies, PA-C, Eligha Bridegroom, NP, Tereso Newcomer, PA-C, or Perlie Gold, PA-C     Then, Tonny Bollman, MD will plan to see you again in 1 year(s).     Other Instructions ZIO XT- Long Term Monitor Instructions  Your physician has requested you wear a ZIO patch monitor for 3 days.  This is a single patch monitor. [employer] supplies one patch monitor per enrollment. Additional stickers are not available. Please do not apply patch if you will be having a Nuclear Stress Test,  Echocardiogram, Cardiac CT, MRI, or Chest Xray during the period you would be wearing the  monitor. The patch cannot be worn during these tests. You cannot remove and re-apply the  ZIO XT patch monitor.  Your ZIO patch monitor will be mailed 3 day USPS to your address on file. It may take 3-5 days  to receive your monitor after you have been enrolled.  Once you have received your monitor, please review the enclosed instructions. Your monitor  has already been registered assigning a specific monitor serial # to you.  Billing and Patient Assistance Program Information  We have supplied [employer] with any of your insurance information on file for billing purposes. [employer] offers a sliding scale Patient Assistance Program for patients that do not have  insurance, or whose insurance does not completely cover the cost of the ZIO monitor.  You must apply for the Patient Assistance Program  to qualify for this discounted rate.  To apply, please call [employer] at [phone], select option 4, select option 2, ask to apply for  Patient Assistance Program. Meredeth Ide will ask your household income, and how many people  are in your household. They will quote your out-of-pocket cost based on that information.  [employer] will  also be able to set up a 27-month, interest-free payment plan if needed.  Applying the monitor   Shave hair from upper left chest.  Hold abrader disc by orange tab. Rub abrader in 40 strokes over the upper left chest as  indicated in your monitor instructions.  Clean area with 4 enclosed alcohol pads. Let dry.  Apply patch as indicated in monitor instructions. Patch will be placed under collarbone on left  side of chest with arrow pointing upward.  Rub patch adhesive wings for 2 minutes. Remove white label marked "1". Remove the white  label marked "2". Rub patch adhesive wings for 2 additional minutes.  While looking in a mirror, press and release button in center of patch. A small green light will  flash 3-4 times. This will be your only indicator that the monitor has been turned on.  Do not shower for the first 24 hours. You may shower after the first 24 hours.  Press the button if you feel a symptom. You will hear a small click. Record Date, Time and  Symptom in the Patient Logbook.  When you are ready to remove the patch, follow instructions on the last 2 pages of Patient  Logbook. Stick patch monitor onto the last page of Patient Logbook.  Place Patient Logbook in the blue and white box. Use locking tab on box and tape box closed  securely. The blue and white box has prepaid postage on it. Please place it in the mailbox as  soon as possible. Your physician should have your test results approximately 7 days after the  monitor has been mailed back to [facility].  Call [facility] Customer Care at [phone] if you have questions regarding  your ZIO XT patch monitor. Call them immediately if you see an orange light blinking on your  monitor.  If your monitor falls off in less than 4 days, contact our Monitor department at [phone].  If your monitor becomes loose or falls off after 4 days call [employer] at [phone] for  suggestions on securing your monitor

## 2022-11-16 NOTE — Progress Notes (Unsigned)
ZIO XT serial # [id number] from office inventory applied to patient.

## 2022-11-17 ENCOUNTER — Other Ambulatory Visit: Payer: Self-pay | Admitting: Family Medicine

## 2022-11-17 ENCOUNTER — Other Ambulatory Visit: Payer: Self-pay | Admitting: Cardiovascular Disease

## 2022-11-17 DIAGNOSIS — I4891 Unspecified atrial fibrillation: Secondary | ICD-10-CM

## 2022-11-17 NOTE — Telephone Encounter (Signed)
Eliquis 5mg  refill request received. Patient is 89 years old, weight-116.8kg, Crea-0.94 on 10/28/22, Diagnosis-Afib, and last seen by Dr. Excell Seltzer on 11/17/22. Dose is appropriate based on dosing criteria. Will send in refill to requested pharmacy.

## 2022-11-24 ENCOUNTER — Other Ambulatory Visit (HOSPITAL_COMMUNITY): Payer: Self-pay

## 2022-11-24 ENCOUNTER — Other Ambulatory Visit (HOSPITAL_COMMUNITY): Payer: Self-pay | Admitting: Pharmacy Technician

## 2022-11-24 ENCOUNTER — Other Ambulatory Visit: Payer: Self-pay | Admitting: Hematology

## 2022-11-24 ENCOUNTER — Other Ambulatory Visit: Payer: Self-pay

## 2022-11-24 ENCOUNTER — Telehealth: Payer: Self-pay

## 2022-11-24 DIAGNOSIS — C61 Malignant neoplasm of prostate: Secondary | ICD-10-CM

## 2022-11-24 DIAGNOSIS — R0602 Shortness of breath: Secondary | ICD-10-CM | POA: Diagnosis not present

## 2022-11-24 DIAGNOSIS — I4821 Permanent atrial fibrillation: Secondary | ICD-10-CM | POA: Diagnosis not present

## 2022-11-24 MED ORDER — ABIRATERONE ACETATE 250 MG PO TABS
250.0000 mg | ORAL_TABLET | Freq: Every day | ORAL | 0 refills | Status: DC
Start: 2022-11-24 — End: 2023-01-24
  Filled 2022-11-24: qty 30, 30d supply, fill #0

## 2022-11-24 MED ORDER — ABIRATERONE ACETATE 250 MG PO TABS: 250.0000 mg | ORAL_TABLET | Freq: Every day | ORAL | Status: DC

## 2022-11-24 MED ORDER — ABIRATERONE ACETATE 250 MG PO TABS: 500.0000 mg | ORAL_TABLET | Freq: Every day | ORAL | 0 refills | Status: DC

## 2022-11-24 NOTE — Progress Notes (Signed)
Specialty Pharmacy Refill Coordination Note  VENTON DIPPOLD is a 89 y.o. male contacted today regarding refills of specialty medication(s) Abiraterone Acetate .  Patient requested [name] at [facility] at [place]  on 12/06/22   Medication will be filled on 12/05/22.   Refill Request has been sent to MD; Patient Taking 1 tablet daily.

## 2022-11-25 ENCOUNTER — Telehealth (HOSPITAL_COMMUNITY): Payer: Self-pay | Admitting: Cardiovascular Disease

## 2022-11-25 NOTE — Telephone Encounter (Signed)
Patients daughter called and cancelled echocardiogram. Patient is battling cancer and does not want to take on another issue at this time. Order will be removed from the [facility].

## 2022-11-25 NOTE — Telephone Encounter (Signed)
Oral Oncology Pharmacist Encounter   Patient fills Zytiga through [facility]. Script was sent is as no print. Modified script to normal so that prescription can be dispensed.    Oral Chemotherapy Clinic will follow up with outside pharmacy to ensure Rx is delivered.    Bethel Born, PharmD Hematology/Oncology Clinical Pharmacist [facility] Oral Chemotherapy Navigation Clinic [phone] 11/25/2022 8:28 AM

## 2022-12-02 ENCOUNTER — Ambulatory Visit (HOSPITAL_COMMUNITY): Payer: Medicare Other

## 2022-12-05 ENCOUNTER — Other Ambulatory Visit: Payer: Self-pay

## 2022-12-06 ENCOUNTER — Other Ambulatory Visit (HOSPITAL_COMMUNITY): Payer: Self-pay

## 2022-12-19 ENCOUNTER — Ambulatory Visit: Payer: Medicare Other | Admitting: Hematology

## 2022-12-19 ENCOUNTER — Other Ambulatory Visit: Payer: Medicare Other

## 2022-12-19 ENCOUNTER — Other Ambulatory Visit: Payer: Self-pay

## 2022-12-19 DIAGNOSIS — C772 Secondary and unspecified malignant neoplasm of intra-abdominal lymph nodes: Secondary | ICD-10-CM

## 2022-12-19 NOTE — Progress Notes (Unsigned)
Palliative Medicine [facility]  Telephone:([phone]) [phone] Fax:(336) [phone]   Name: [name] Date: 12/19/2022 MRN: [id number]  DOB: 01/22/1934  Patient Care Team: Joaquim Nam, MD as PCP - General (Family Medicine) Tonny Bollman, MD as PCP - Cardiology (Cardiology) Laurey Morale, MD as Consulting Physician (Cardiology) Kennon Rounds as Physician Assistant (Cardiology) Cherlyn Cushing, RN as Oncology Nurse Navigator [name], [name], NP as Nurse Practitioner (Nurse Practitioner) [name], MD as Consulting Physician (Urology)   INTERVAL HISTORY: [name] is a 89 y.o. male with oncologic medical history including malignant neoplasm of prostate (06/2022) metastatic disease to intra-abdominal lymph node. Previous history of bladder cancer (06/2011), abdominal aortic aneurysm without rupture, CHF, A-Fib, and HLD. Palliative ask to see for symptom management and goals of care.     SOCIAL HISTORY:     reports that he quit smoking about 32 years ago. His smoking use included cigarettes. He started smoking about 62 years ago. He has a 37.5 pack-year smoking history. He has never used smokeless tobacco. He reports that he does not drink alcohol and does not use drugs.  ADVANCE DIRECTIVES:  None on file  CODE STATUS: Full code  PAST MEDICAL HISTORY: Past Medical History:  Diagnosis Date   AAA (abdominal aortic aneurysm) (HCC)    s/p stent graft repair 2012   Arthritis    "right ankle" (04/17/2014)   Ascending aorta dilatation (HCC)    Echo 2021: 42 mm // Echocardiogram 10/22: EF 55-60, no RWMA, mildly reduced RVSF, RVSP 39.7, severe BAE, trivial MR, mod TR, trivial AI, mild AS (mean 8), ascending aorta 40 mm   Atrial fibrillation (HCC)    a. permanent;  b. Hematuria on Pradaxa;  c. cold intol and chills with Xarelto   Bladder cancer (HCC)    Cardiomyopathy (HCC)    a. Echo (03/2013): Inferior and inferolateral AK, EF 40-45%,  mild MR, mod LAE, mod RAE, mod TR, mild PI, PASP 46   Chronic kidney disease    COPD (chronic obstructive pulmonary disease) (HCC)    Coronary artery disease s/p cabg x7  1992   a. s/p CABG in 1992 (X 7 - Dr. Andrey Campanile);  b.  Myoview (01/2011): Fixed inferior defect suggestive of prior MI versus diaphragmatic attenuation, apical reversibility possibly suspicious for small area of infarction with peri-infarct ischemia, no significant change from prior study, not gated, low risk study;  c.  Echo (05/2010): Mod LVH, EF 55-65%, mild AI, mild MR, moderate LAE, mild RAE, moderate    Hematuria bladder tumor ---  followed by Dr Vernie Ammons   Hiatal hernia    repaired in 1992 w/[facility]   Hx of cardiovascular stress test    Lexiscan Myoview (03/2013):  No ischemia; not gated.   Hyperglycemia diet controlled   Hyperlipidemia    Hypertension    Hypothyroidism    Obesity    Organic impotence    Prostate cancer (HCC)    RBBB    S/P AAA repair using bifurcation graft    Sleep apnea no rx cpap   mild- tested  2012   Ventricular ectopy    mild    ALLERGIES:  is allergic to dabigatran etexilate mesylate, xarelto [rivaroxaban], and metoprolol.  MEDICATIONS:  Current Outpatient Medications  Medication Sig Dispense Refill   abiraterone acetate (ZYTIGA) 250 MG tablet Take 1 tablet (250 mg total) by mouth daily. Take on an empty stomach 1 hour before or 2 hours after  a meal 30 tablet 0   atorvastatin (LIPITOR) 40 MG tablet TAKE ONE TABLET BY MOUTH ONCE A DAY 90 tablet 1   docusate sodium (COLACE) 100 MG capsule Take 1 capsule (100 mg total) by mouth daily as needed for up to 30 doses. 30 capsule 0   ELIQUIS 5 MG TABS tablet TAKE ONE TABLET BY MOUTH TWICE (2) DAILY 60 tablet 5   finasteride (PROSCAR) 5 MG tablet Take 5 mg by mouth daily.     furosemide (LASIX) 20 MG tablet Take 1 tablet (20 mg total) by mouth daily.     levothyroxine (SYNTHROID) 137 MCG tablet TAKE ONE TABLET BY MOUTH ONCE A DAY ON AN EMPTY STOMACH  90 tablet 2   predniSONE (DELTASONE) 5 MG tablet Take 1 tablet (5 mg total) by mouth daily with breakfast. 90 tablet 1   ramipril (ALTACE) 5 MG capsule TAKE ONE CAPSULE BY MOUTH TWO TIMES DAILY 180 capsule 1   tamsulosin (FLOMAX) 0.4 MG CAPS capsule Take 0.4 mg by mouth at bedtime.     No current facility-administered medications for this visit.    VITAL SIGNS: There were no vitals taken for this visit. There were no vitals filed for this visit.  Estimated body mass index is 35.9 kg/m as calculated from the following:   Height as of 11/16/22: 5\' 11"  (1.803 m).   Weight as of 11/16/22: 257 lb 6.4 oz (116.8 kg).   PERFORMANCE STATUS (ECOG) : 0 - Asymptomatic  Assessment NAD RRR Normal breathing pattern AAO x3  IMPRESSION: [name] presents to clinic for follow-up. No acute distress. His family is present. He is doing well overall. Taking things one day at a time. Appetite is good. Some days better than others. Denies nausea, vomiting, constipation, or diarrhea.   Denies pain or discomfort. Is remaining as active as possible. Shares he is looking forward to the upcoming holidays and spending time with his family.   No symptom management needs at this time. Patient and family knows we are available as needed. I am glad patient is doing much better with improved quality of life.   I discussed the importance of continued conversation with family and their medical providers regarding overall plan of care and treatment options, ensuring decisions are within the context of the patients values and GOCs.  PLAN:  No symptom management needs at this time.  DNR/DNI. Patient has out of facility form MOST form on file  Patient and family knows to contact office as needed.   Patient expressed understanding and was in agreement with this plan. He also understands that He can call the clinic at any time with any questions, concerns, or complaints.   Any controlled substances utilized were  prescribed in the context of palliative care. PDMP has been reviewed.    Visit consisted of counseling and education dealing with the complex and emotionally intense issues of symptom management and palliative care in the setting of serious and potentially life-threatening illness.Greater than 50%  of this time was spent counseling and coordinating care related to the above assessment and plan.  Willette Alma, AGPCNP-BC  Palliative Medicine Team/[facility]  *Please note that this is a verbal dictation therefore any spelling or grammatical errors are due to the "Dragon Medical One" system interpretation.

## 2022-12-20 ENCOUNTER — Inpatient Hospital Stay (HOSPITAL_BASED_OUTPATIENT_CLINIC_OR_DEPARTMENT_OTHER): Payer: Medicare Other | Admitting: Hematology

## 2022-12-20 ENCOUNTER — Encounter: Payer: Self-pay | Admitting: Nurse Practitioner

## 2022-12-20 ENCOUNTER — Inpatient Hospital Stay: Payer: Medicare Other | Attending: Hematology

## 2022-12-20 ENCOUNTER — Inpatient Hospital Stay (HOSPITAL_BASED_OUTPATIENT_CLINIC_OR_DEPARTMENT_OTHER): Payer: Medicare Other | Admitting: Nurse Practitioner

## 2022-12-20 VITALS — BP 147/76 | HR 68 | Temp 97.2°F | Resp 18 | Wt 257.9 lb

## 2022-12-20 DIAGNOSIS — Z7901 Long term (current) use of anticoagulants: Secondary | ICD-10-CM | POA: Insufficient documentation

## 2022-12-20 DIAGNOSIS — C61 Malignant neoplasm of prostate: Secondary | ICD-10-CM

## 2022-12-20 DIAGNOSIS — C772 Secondary and unspecified malignant neoplasm of intra-abdominal lymph nodes: Secondary | ICD-10-CM

## 2022-12-20 DIAGNOSIS — Z87891 Personal history of nicotine dependence: Secondary | ICD-10-CM | POA: Insufficient documentation

## 2022-12-20 DIAGNOSIS — Z8551 Personal history of malignant neoplasm of bladder: Secondary | ICD-10-CM | POA: Insufficient documentation

## 2022-12-20 DIAGNOSIS — R530 Neoplastic (malignant) related fatigue: Secondary | ICD-10-CM

## 2022-12-20 DIAGNOSIS — C775 Secondary and unspecified malignant neoplasm of intrapelvic lymph nodes: Secondary | ICD-10-CM | POA: Insufficient documentation

## 2022-12-20 DIAGNOSIS — C7951 Secondary malignant neoplasm of bone: Secondary | ICD-10-CM | POA: Insufficient documentation

## 2022-12-20 DIAGNOSIS — Z79899 Other long term (current) drug therapy: Secondary | ICD-10-CM | POA: Diagnosis not present

## 2022-12-20 DIAGNOSIS — I714 Abdominal aortic aneurysm, without rupture, unspecified: Secondary | ICD-10-CM | POA: Diagnosis not present

## 2022-12-20 DIAGNOSIS — Z515 Encounter for palliative care: Secondary | ICD-10-CM

## 2022-12-20 DIAGNOSIS — I4891 Unspecified atrial fibrillation: Secondary | ICD-10-CM | POA: Insufficient documentation

## 2022-12-20 DIAGNOSIS — Z7952 Long term (current) use of systemic steroids: Secondary | ICD-10-CM | POA: Diagnosis not present

## 2022-12-20 LAB — CMP (CANCER CENTER ONLY)
ALT: 8 U/L (ref 0–44)
AST: 10 U/L — ABNORMAL LOW (ref 15–41)
Albumin: 3.9 g/dL (ref 3.5–5.0)
Alkaline Phosphatase: 82 U/L (ref 38–126)
Anion gap: 7 (ref 5–15)
BUN: 13 mg/dL (ref 8–23)
CO2: 25 mmol/L (ref 22–32)
Calcium: 9.3 mg/dL (ref 8.9–10.3)
Chloride: 107 mmol/L (ref 98–111)
Creatinine: 0.78 mg/dL (ref 0.61–1.24)
GFR, Estimated: >60 mL/min (ref >60)
Glucose, Bld: 182 mg/dL — ABNORMAL HIGH (ref 70–99)
Potassium: 3.6 mmol/L (ref 3.5–5.1)
Sodium: 139 mmol/L (ref 135–145)
Total Bilirubin: 0.9 mg/dL (ref 0.3–1.2)
Total Protein: 6.8 g/dL (ref 6.5–8.1)

## 2022-12-20 LAB — CBC WITH DIFFERENTIAL (CANCER CENTER ONLY)
Abs Immature Granulocytes: 0.02 10*3/uL (ref 0.00–0.07)
Basophils Absolute: 0 10*3/uL (ref 0.0–0.1)
Basophils Relative: 1 %
Eosinophils Absolute: 0.1 10*3/uL (ref 0.0–0.5)
Eosinophils Relative: 2 %
HCT: 40.7 % (ref 39.0–52.0)
Hemoglobin: 13.3 g/dL (ref 13.0–17.0)
Immature Granulocytes: 0 %
Lymphocytes Relative: 17 %
Lymphs Abs: 1 10*3/uL (ref 0.7–4.0)
MCH: 30 pg (ref 26.0–34.0)
MCHC: 32.7 g/dL (ref 30.0–36.0)
MCV: 91.7 fL (ref 80.0–100.0)
Monocytes Absolute: 0.5 10*3/uL (ref 0.1–1.0)
Monocytes Relative: 8 %
Neutro Abs: 4.5 10*3/uL (ref 1.7–7.7)
Neutrophils Relative %: 72 %
Platelet Count: 179 10*3/uL (ref 150–400)
RBC: 4.44 MIL/uL (ref 4.22–5.81)
RDW: 14 % (ref 11.5–15.5)
WBC Count: 6.2 10*3/uL (ref 4.0–10.5)
nRBC: 0 % (ref 0.0–0.2)

## 2022-12-20 NOTE — Progress Notes (Signed)
HEMATOLOGY/ONCOLOGY CLINIC NOTE  Date of Service:  12/20/22   Patient Care Team: Joaquim Nam, MD as PCP - General (Family Medicine) Tonny Bollman, MD as PCP - Cardiology (Cardiology) Laurey Morale, MD as Consulting Physician (Cardiology) Kennon Rounds as Physician Assistant (Cardiology) Cherlyn Cushing, RN as Oncology Nurse Navigator [name], [name], NP as Nurse Practitioner (Nurse Practitioner) [name], MD as Consulting Physician (Urology)  CHIEF COMPLAINTS/PURPOSE OF CONSULTATION:   Malignant neoplasm of prostate metastatic to intra-abdominal lymph node [facility])   Prior Therapy: CT-guided biopsy of the left pelvic lymph nodes on June 02, 2021 showed metastatic carcinoma staining positive for PSA.   Current therapy:   Eligard 45 mg every 6 months started in June 2023.  He is currently receiving that under the care of [facility].   Zytiga 1000 mg with prednisone 5 mg started on August 03, 2021.  His dose was reduced to 500 mg daily dose starting in September 2023.  HISTORY OF PRESENTING ILLNESS:   [name] is a wonderful 89 y.o. male who is here for continued evaluation and management of Malignant neoplasm of prostate metastatic to intra-abdominal lymph node (HCC). Patient was getting followed up by [name]. Patient is accompanied by his daughter during this visit.   Patient was initially diagnosed with advanced prostate cancer in May 2023. He also had castration-sensitive with lymphadenopathy and bone disease, PSA with 24 at the time of diagnosis. He was also previously diagnosed with superficial bladder tumor in 2012 and developed relapsed disease in May 2023 with CIS.  Patient's current treatment includes Eligard 45 mg every 6 months which was started in June 2023 and Zytiga 500 mg with Prednisone 5 mg. He previously on Zytiga 1000 mg from August 03, 2021 to September 2023. Reduced to 500 mg in September 2023.   Patient was last seen  by [name] on 01/18/2022 and he was doing well overall. He denied of any toxicities with Zytiga after reducing the dosage. During the last visit, Foley catheter was removed.   He reports he has been doing well overall since his last visit with [name]. He denies fever, chills, night sweats, new lumps/bumps, urinary problems, abnormal bowel movement, unexpected weight loss, bone pain, abdominal pain, chest pain, back pain. However, he does complain of increased fatigue, occasional bilateral leg swelling, occasional shortness of breath, and occasional constipation. His fatigue is affecting his daily activities, such as getting tired making breakfast. He notes that his fatigue causes bilateral leg weakness.  Patient's daughter notes that the patient recently had sinus infection since the last visit. He has recovered well from the infection. His daughter also reports that the patient has decreased appetite, but his weight has been stable.   He has dentures and no other dental issues.   He is complaint with all of his medications.   Patient's daughter notes that he has a blister on his right feet sole, which is affecting his walking and movement.   INTERVAL HISTORY:  [name] is a wonderful 89 y.o. male who is here for continued evaluation and management of Malignant neoplasm of prostate metastatic to intra-abdominal lymph node (HCC).   Patient was last seen by me on 10/18/2022 and he complained of worsened fatigue, lethargy, insomnia, nasal congestion, and SOB.   Patient is accompanied by his wife and his son during this visit. Patient notes he has been doing well overall since our last visit. He has been prescribed  lasix, which has improved his leg swelling. Patient takes Lasix once daily.   Patient notes he recently had a visit with his Cardiologist and notes that he is planning on having Echocardiogram.   Patient is unsure when he had his last Eligard injection. He will follow-up  with his Urologist.   He denies any new infection issues, fever, chills, night sweats, new respiratory symptoms, SOB, unexpected weight loss, back pain, chest pain. Patient reports that he lost around 15 lbs since last year.   Patient notes that his appetite and energy have improved since our last visit.   MEDICAL HISTORY:  Past Medical History:  Diagnosis Date   AAA (abdominal aortic aneurysm) (HCC)    s/p stent graft repair 2012   Arthritis    "right ankle" (04/17/2014)   Ascending aorta dilatation (HCC)    Echo 2021: 42 mm // Echocardiogram 10/22: EF 55-60, no RWMA, mildly reduced RVSF, RVSP 39.7, severe BAE, trivial MR, mod TR, trivial AI, mild AS (mean 8), ascending aorta 40 mm   Atrial fibrillation (HCC)    a. permanent;  b. Hematuria on Pradaxa;  c. cold intol and chills with Xarelto   Bladder cancer (HCC)    Cardiomyopathy (HCC)    a. Echo (03/2013): Inferior and inferolateral AK, EF 40-45%, mild MR, mod LAE, mod RAE, mod TR, mild PI, PASP 46   Chronic kidney disease    COPD (chronic obstructive pulmonary disease) (HCC)    Coronary artery disease s/p cabg x7  1992   a. s/p CABG in 1992 (X 7 - Dr. Andrey Campanile);  b.  Myoview (01/2011): Fixed inferior defect suggestive of prior MI versus diaphragmatic attenuation, apical reversibility possibly suspicious for small area of infarction with peri-infarct ischemia, no significant change from prior study, not gated, low risk study;  c.  Echo (05/2010): Mod LVH, EF 55-65%, mild AI, mild MR, moderate LAE, mild RAE, moderate    Hematuria bladder tumor ---  followed by Dr Vernie Ammons   Hiatal hernia    repaired in 1992 w/[facility]   Hx of cardiovascular stress test    Lexiscan Myoview (03/2013):  No ischemia; not gated.   Hyperglycemia diet controlled   Hyperlipidemia    Hypertension    Hypothyroidism    Obesity    Organic impotence    Prostate cancer (HCC)    RBBB    S/P AAA repair using bifurcation graft    Sleep apnea no rx cpap   mild- tested   2012   Ventricular ectopy    mild    SURGICAL HISTORY: Past Surgical History:  Procedure Laterality Date   ABDOMINAL AORTIC ANEURYSM REPAIR  02/28/2010   using bifurcation graft   CARDIAC CATHETERIZATION  03/31/1990   Positive   CARDIOVASCULAR STRESS TEST  02-01-2011  NUCLEAR NO EXERCISE   LOW RISK STUDY.  SMALL APICAL DEFECT. NO EVIDENCE OF MULTIZONE ISCHEMIA.   CORONARY ARTERY BYPASS GRAFT  05/30/1990   CABG X 7   CYSTOSCOPY N/A 01/07/2022   Procedure: CYSTOSCOPY, BLADDER BIOPSY WITH FULGERATION BILATERAL RETROGRADE PYELOGRAM;  Surgeon: [name], MD;  Location: [facility];  Service: Urology;  Laterality: N/A;  45 MINUTES NEEDED FOR CASE  INTUBATED PARALYZED   CYSTOSCOPY WITH BIOPSY N/A 07/23/2021   Procedure: CYSTOSCOPY WITH  BLADDER BIOPSY AND BILATERAL RETROGRADE PYELOGRAM;  Surgeon: [name], MD;  Location: [facility];  Service: Urology;  Laterality: N/A;   ENDOVASCULAR STENT INSERTION  02/15/2011   Procedure: ENDOVASCULAR STENT GRAFT INSERTION;  Surgeon: Arlys John  Carmon Sails, MD;  Location: [facility] OR;  Service: Vascular;  Laterality: N/A;  Insertion of endovascular stent graft    ETT  10/31/2000   [facility]   HIATAL HERNIA REPAIR  02/28/1990   "repaired when I had my OHS"   Hospital - Afib  03/28/2002   INGUINAL HERNIA REPAIR Left 03/01/1995   TOTAL ANKLE ARTHROPLASTY Right 04/17/2014   Procedure: RIGHT TOTAL ANKLE ARTHOPLASTY;  Surgeon: Toni Arthurs, MD;  Location: [facility] OR;  Service: Orthopedics;  Laterality: Right;   TRANSURETHRAL RESECTION OF BLADDER TUMOR  03/25/2011   Procedure: TRANSURETHRAL RESECTION OF BLADDER TUMOR (TURBT);  Surgeon: Garnett Farm, MD;  Location: [facility];  Service: Urology;  Laterality: N/A;    SOCIAL HISTORY: Social History   Socioeconomic History   Marital status: Widowed    Spouse name: Not on file   Number of children: Not on file   Years of education: Not on file   Highest education level: Not on file  Occupational History    Occupation: retired Water quality scientist.    Employer: retired    Comment: 2001. Likes to play golf . Travels with motor home  Tobacco Use   Smoking status: Former    Current packs/day: 0.00    Average packs/day: 1.3 packs/day for 30.0 years (37.5 ttl pk-yrs)    Types: Cigarettes    Start date: 02/29/1960    Quit date: 02/28/1990    Years since quitting: 32.8   Smokeless tobacco: Never  Vaping Use   Vaping status: Never Used  Substance and Sexual Activity   Alcohol use: No    Alcohol/week: 0.0 standard drinks of alcohol    Comment: basically no alcohol use.  only very rare alcohol use.    Drug use: No   Sexual activity: Yes  Other Topics Concern   Not on file  Social History Narrative   From [place]   Lives with girlfriend.     Divorced, remarried then widowed, 2 children alive- (prev with 3 children- from 1st marriage)   Likes to golf   Prev 4 sport athlete in HS, football fan.    Social Determinants of Health   Financial Resource Strain: Low Risk  (12/09/2021)   Overall Financial Resource Strain (CARDIA)    Difficulty of Paying Living Expenses: Not hard at all  Food Insecurity: No Food Insecurity (12/09/2021)   Hunger Vital Sign    Worried About Running Out of Food in the Last Year: Never true    Ran Out of Food in the Last Year: Never true  Transportation Needs: No Transportation Needs (12/09/2021)   PRAPARE - Administrator, Civil Service (Medical): No    Lack of Transportation (Non-Medical): No  Physical Activity: Inactive (12/09/2021)   Exercise Vital Sign    Days of Exercise per Week: 0 days    Minutes of Exercise per Session: 0 min  Stress: No Stress Concern Present (12/09/2021)   [employer] of Occupational Health - Occupational Stress Questionnaire    Feeling of Stress : Not at all  Social Connections: Socially Isolated (12/09/2021)   Social Connection and Isolation Panel [NHANES]    Frequency of Communication with Friends and Family:  Three times a week    Frequency of Social Gatherings with Friends and Family: Once a week    Attends Religious Services: Never    Database administrator or Organizations: No    Attends Banker Meetings: Never    Marital Status: Widowed  Intimate Partner Violence: Not At Risk (12/09/2021)   Humiliation, Afraid, Rape, and Kick questionnaire    Fear of Current or Ex-Partner: No    Emotionally Abused: No    Physically Abused: No    Sexually Abused: No    FAMILY HISTORY: Family History  Problem Relation Age of Onset   Obesity Mother 70       deceased   Hypertension Mother    Diabetes Mother    Heart disease Mother        before age 23   Hyperlipidemia Mother    Alcohol abuse Sister 34       deceased   Other Father        deceased unknown   Colon cancer Neg Hx    Prostate cancer Neg Hx    Anesthesia problems Neg Hx     ALLERGIES:  is allergic to dabigatran etexilate mesylate, xarelto [rivaroxaban], and metoprolol.  MEDICATIONS:  Current Outpatient Medications  Medication Sig Dispense Refill   abiraterone acetate (ZYTIGA) 250 MG tablet Take 1 tablet (250 mg total) by mouth daily. Take on an empty stomach 1 hour before or 2 hours after a meal 30 tablet 0   atorvastatin (LIPITOR) 40 MG tablet TAKE ONE TABLET BY MOUTH ONCE A DAY 90 tablet 1   docusate sodium (COLACE) 100 MG capsule Take 1 capsule (100 mg total) by mouth daily as needed for up to 30 doses. 30 capsule 0   ELIQUIS 5 MG TABS tablet TAKE ONE TABLET BY MOUTH TWICE (2) DAILY 60 tablet 5   finasteride (PROSCAR) 5 MG tablet Take 5 mg by mouth daily.     furosemide (LASIX) 20 MG tablet Take 1 tablet (20 mg total) by mouth daily.     levothyroxine (SYNTHROID) 137 MCG tablet TAKE ONE TABLET BY MOUTH ONCE A DAY ON AN EMPTY STOMACH 90 tablet 2   predniSONE (DELTASONE) 5 MG tablet Take 1 tablet (5 mg total) by mouth daily with breakfast. 90 tablet 1   ramipril (ALTACE) 5 MG capsule TAKE ONE CAPSULE BY MOUTH TWO  TIMES DAILY 180 capsule 1   tamsulosin (FLOMAX) 0.4 MG CAPS capsule Take 0.4 mg by mouth at bedtime.     No current facility-administered medications for this visit.    REVIEW OF SYSTEMS:    10 Point review of Systems was done is negative except as noted above.  PHYSICAL EXAMINATION: ECOG PERFORMANCE STATUS: 2 - Symptomatic, <50% confined to bed  . Vitals:   12/20/22 1410  BP: (!) 147/76  Pulse: 68  Resp: 18  Temp: (!) 97.2 F (36.2 C)  SpO2: 99%     Filed Weights   12/20/22 1410  Weight: 257 lb 14.4 oz (117 kg)     .Body mass index is 35.97 kg/m.  GENERAL:alert, in no acute distress and comfortable SKIN: no acute rashes, no significant lesions EYES: conjunctiva are pink and non-injected, sclera anicteric OROPHARYNX: MMM, no exudates, no oropharyngeal erythema or ulceration NECK: supple, no JVD LYMPH:  no palpable lymphadenopathy in the cervical, axillary or inguinal regions LUNGS: clear to auscultation b/l with normal respiratory effort HEART: regular rate & rhythm ABDOMEN:  normoactive bowel sounds , non tender, not distended. Extremity: no pedal edema PSYCH: alert & oriented x 3 with fluent speech NEURO: no focal motor/sensory deficits  LABORATORY DATA:  I have reviewed the data as listed .    Latest Ref Rng & Units 12/20/2022    1:43 PM 10/18/2022   11:50 AM 07/19/2022  11:53 AM  CBC  WBC 4.0 - 10.5 K/uL 6.2  6.6  6.7   Hemoglobin 13.0 - 17.0 g/dL 16.1  09.6  04.5   Hematocrit 39.0 - 52.0 % 40.7  39.1  41.2   Platelets 150 - 400 K/uL 179  198  174    .    Latest Ref Rng & Units 12/20/2022    1:43 PM 10/28/2022   12:29 PM 10/18/2022   11:50 AM  CMP  Glucose 70 - 99 mg/dL 409  811  914   BUN 8 - 23 mg/dL 13  17  15    Creatinine 0.61 - 1.24 mg/dL 7.82  9.56  2.13   Sodium 135 - 145 mmol/L 139  140  140   Potassium 3.5 - 5.1 mmol/L 3.6  3.5  3.7   Chloride 98 - 111 mmol/L 107  104  104   CO2 22 - 32 mmol/L 25  26  30    Calcium 8.9 - 10.3 mg/dL  9.3  9.4  9.0   Total Protein 6.5 - 8.1 g/dL 6.8   6.9   Total Bilirubin 0.3 - 1.2 mg/dL 0.9   0.9   Alkaline Phos 38 - 126 U/L 82   88   AST 15 - 41 U/L 10   11   ALT 0 - 44 U/L 8   9     RADIOGRAPHIC STUDIES: I have personally reviewed the radiological images as listed and agreed with the findings in the report. LONG TERM MONITOR (3-14 DAYS)  Addendum Date: 12/07/2022   Patch Wear Time:  4 days and 0 hours (2024-09-18T15:42:14-0400 to 2024-09-22T16:07:04-398) 2 Ventricular Tachycardia runs occurred, the run with the fastest interval lasting 5 beats with a max rate of 174 bpm, the longest lasting 6 beats with an avg rate of 127 bpm. Atrial Fibrillation occurred continuously (100% burden), ranging from 43-156 bpm (avg of 79 bpm). Bundle Branch Block/IVCD was present. Isolated VEs were frequent (5.1%, 22981), VE Couplets were rare (<1.0%, 399), and no VE Triplets were present. Ventricular Bigeminy and Trigeminy were present. SUMMARY: The basic rhythm is atrial fibrillation with an average HR of 79 bpm. Frequent PVC's of 5.1% with a 6 beat run of NSVT. No pathologic pauses > 3 seconds.  Result Date: 12/07/2022 Patch Wear Time:  4 days and 0 hours (2024-09-18T15:42:14-0400 to 2024-09-22T16:07:04-398) 2 Ventricular Tachycardia runs occurred, the run with the fastest interval lasting 5 beats with a max rate of 174 bpm, the longest lasting 6 beats with an avg rate of 127 bpm. Atrial Fibrillation occurred continuously (100% burden), ranging from 43-156 bpm (avg of 79 bpm). Bundle Branch Block/IVCD was present. Isolated VEs were frequent (5.1%, 22981), VE Couplets were rare (<1.0%, 399), and no VE Triplets were present. Ventricular Bigeminy and Trigeminy were present. SUMMARY: The basic rhythm is atrial fibrillation with RVR, average rate 127 bpm. Frequent PVC's of 5.1% with a 6 beat run of NSVT    ASSESSMENT & PLAN:   1.  Castration-sensitive advanced prostate cancer with lymphadenopathy diagnosed in  May 2023.    2.  Androgen deprivation: He will continue to receive Eligard every 6 months under the care of Dr. Cardell Peach and urology. Was last given in December 2023.    3.  Bone directed therapy: He is to continue calcium and vitamin D supplements.    4.  Bladder cancer: He continues to follow with urology regarding this issue.  He has carcinoma in situ on last cystoscopy.  5.  Hypertension: His blood pressure is elevated today and normally is during doctor's visits.  Ambulatory blood pressure monitoring per his report are low are close to normal range.  I have asked him to check his blood pressure periodically and address that with his primary care physician if he is continue to have elevated blood pressure at home.    6.  Urinary retention: Resolved and his catheter has been removed.  Continues to follow with Dr. Cardell Peach regarding this issue.   PLAN: -continue with 1 Zytiga medication, instead of 2 . -Answered all of patient's questions.  -Recommend to wear compression socks to reduce leg swelling.    -Discussed lab results from today, 12/20/2022, in detail with the patient. CBC is stable. CMP I stable PSA remains undetectable -Discussed the chest X-ray result from 10/18/2022, which showed Reticulonodular opacities in the lower lungs, potentially bronchitis and/or atypical infection. nodule/mass in the right upper lung when compared to the prior plain film. Patient had similar in the past.  -Discussed the option of CT scan. Patient wants to wait for now.   FOLLOW-UP:  RTC with Dr Candise Che with labs in 3 months F/u with [facility] for continue Eligard injections   The total time spent in the appointment was 30 minutes* .  All of the patient's questions were answered with apparent satisfaction. The patient knows to call the clinic with any problems, questions or concerns.   Wyvonnia Lora MD MS AAHIVMS [facility] Hematology/Oncology Physician [facility]  .*Total Encounter Time  as defined by the Centers for Medicare and Medicaid Services includes, in addition to the face-to-face time of a patient visit (documented in the note above) non-face-to-face time: obtaining and reviewing outside history, ordering and reviewing medications, tests or procedures, care coordination (communications with other health care professionals or caregivers) and documentation in the medical record.   I,Param Shah,acting as a Neurosurgeon for Wyvonnia Lora, MD.,have documented all relevant documentation on the behalf of Wyvonnia Lora, MD,as directed by  Wyvonnia Lora, MD while in the presence of Wyvonnia Lora, MD.   .I have reviewed the above documentation for accuracy and completeness, and I agree with the above. Johney Maine MD

## 2022-12-21 ENCOUNTER — Telehealth: Payer: Self-pay | Admitting: Hematology

## 2022-12-21 LAB — PROSTATE-SPECIFIC AG, SERUM (LABCORP): Prostate Specific Ag, Serum: <0.1 ng/mL (ref 0.0–4.0)

## 2022-12-21 NOTE — Telephone Encounter (Signed)
Per [facility] LOS 10/22 Left patient a message in regards to next scheduled appointment times/dates

## 2022-12-22 ENCOUNTER — Other Ambulatory Visit: Payer: Self-pay

## 2022-12-22 ENCOUNTER — Other Ambulatory Visit: Payer: Self-pay | Admitting: Hematology

## 2022-12-22 DIAGNOSIS — C61 Malignant neoplasm of prostate: Secondary | ICD-10-CM

## 2023-01-03 ENCOUNTER — Ambulatory Visit (INDEPENDENT_AMBULATORY_CARE_PROVIDER_SITE_OTHER): Payer: Medicare Other

## 2023-01-03 VITALS — Ht 71.0 in | Wt 257.0 lb

## 2023-01-03 DIAGNOSIS — Z0000 Encounter for general adult medical examination without abnormal findings: Secondary | ICD-10-CM

## 2023-01-03 NOTE — Progress Notes (Signed)
Subjective:   [name] is a 89 y.o. male who presents for Medicare Annual/Subsequent preventive examination.  Visit Complete: Virtual I connected with  [name] on 01/03/23 by a audio enabled telemedicine application and verified that I am speaking with the correct person using two identifiers.  Patient Location: Home  Provider Location: Office/Clinic  I discussed the limitations of evaluation and management by telemedicine. The patient expressed understanding and agreed to proceed.  Vital Signs: Because this visit was a virtual/telehealth visit, some criteria may be missing or patient reported. Any vitals not documented were not able to be obtained and vitals that have been documented are patient reported.  Patient Medicare AWV questionnaire was completed by the patient on (not done); I have confirmed that all information answered by patient is correct and no changes since this date. Cardiac Risk Factors include: advanced age (>85men, >14 women);dyslipidemia;hypertension;male gender;obesity (BMI >30kg/m2);sedentary lifestyle    Objective:    Today's Vitals   01/03/23 1123  Weight: 257 lb (116.6 kg)  Height: 5\' 11"  (1.803 m)   Body mass index is 35.84 kg/m.     01/03/2023   11:35 AM 12/21/2021   11:07 AM 12/09/2021   12:38 PM 07/08/2021    4:02 PM 07/06/2021    9:59 AM 06/29/2021    1:37 PM 06/02/2021    8:40 AM  Advanced Directives  Does Patient Have a Medical Advance Directive? Yes No No No No No No  Type of Estate agent of [place];Living will        Would patient like information on creating a medical advance directive?  No - Patient declined No - Patient declined  No - Patient declined  Yes (MAU/Ambulatory/Procedural Areas - Information given)    Current Medications (verified) Outpatient Encounter Medications as of 01/03/2023  Medication Sig   abiraterone acetate (ZYTIGA) 250 MG tablet Take 1 tablet (250 mg total) by mouth daily. Take on  an empty stomach 1 hour before or 2 hours after a meal   atorvastatin (LIPITOR) 40 MG tablet TAKE ONE TABLET BY MOUTH ONCE A DAY   docusate sodium (COLACE) 100 MG capsule Take 1 capsule (100 mg total) by mouth daily as needed for up to 30 doses.   ELIQUIS 5 MG TABS tablet TAKE ONE TABLET BY MOUTH TWICE (2) DAILY   finasteride (PROSCAR) 5 MG tablet Take 5 mg by mouth daily.   furosemide (LASIX) 20 MG tablet TAKE ONE TABLET (20 MG TOTAL) BY MOUTH TWO TIMES DAILY.   levothyroxine (SYNTHROID) 137 MCG tablet TAKE ONE TABLET BY MOUTH ONCE A DAY ON AN EMPTY STOMACH   predniSONE (DELTASONE) 5 MG tablet TAKE ONE TABLET (5 MG TOTAL) BY MOUTH DAILY WITH BREAKFAST.   ramipril (ALTACE) 5 MG capsule TAKE ONE CAPSULE BY MOUTH TWO TIMES DAILY   tamsulosin (FLOMAX) 0.4 MG CAPS capsule Take 0.4 mg by mouth at bedtime.   No facility-administered encounter medications on file as of 01/03/2023.    Allergies (verified) Dabigatran etexilate mesylate, Xarelto [rivaroxaban], and Metoprolol   History: Past Medical History:  Diagnosis Date   AAA (abdominal aortic aneurysm) (HCC)    s/p stent graft repair 2012   Arthritis    "right ankle" (04/17/2014)   Ascending aorta dilatation (HCC)    Echo 2021: 42 mm // Echocardiogram 10/22: EF 55-60, no RWMA, mildly reduced RVSF, RVSP 39.7, severe BAE, trivial MR, mod TR, trivial AI, mild AS (mean 8), ascending aorta 40 mm   Atrial fibrillation (  HCC)    a. permanent;  b. Hematuria on Pradaxa;  c. cold intol and chills with Xarelto   Bladder cancer (HCC)    Cardiomyopathy (HCC)    a. Echo (03/2013): Inferior and inferolateral AK, EF 40-45%, mild MR, mod LAE, mod RAE, mod TR, mild PI, PASP 46   Chronic kidney disease    COPD (chronic obstructive pulmonary disease) (HCC)    Coronary artery disease s/p cabg x7  1992   a. s/p CABG in 1992 (X 7 - Dr. Andrey Campanile);  b.  Myoview (01/2011): Fixed inferior defect suggestive of prior MI versus diaphragmatic attenuation, apical  reversibility possibly suspicious for small area of infarction with peri-infarct ischemia, no significant change from prior study, not gated, low risk study;  c.  Echo (05/2010): Mod LVH, EF 55-65%, mild AI, mild MR, moderate LAE, mild RAE, moderate    Hematuria bladder tumor ---  followed by Dr Vernie Ammons   Hiatal hernia    repaired in 1992 w/[facility]   Hx of cardiovascular stress test    Lexiscan Myoview (03/2013):  No ischemia; not gated.   Hyperglycemia diet controlled   Hyperlipidemia    Hypertension    Hypothyroidism    Obesity    Organic impotence    Prostate cancer (HCC)    RBBB    S/P AAA repair using bifurcation graft    Sleep apnea no rx cpap   mild- tested  2012   Ventricular ectopy    mild   Past Surgical History:  Procedure Laterality Date   ABDOMINAL AORTIC ANEURYSM REPAIR  02/28/2010   using bifurcation graft   CARDIAC CATHETERIZATION  03/31/1990   Positive   CARDIOVASCULAR STRESS TEST  02-01-2011  NUCLEAR NO EXERCISE   LOW RISK STUDY.  SMALL APICAL DEFECT. NO EVIDENCE OF MULTIZONE ISCHEMIA.   CORONARY ARTERY BYPASS GRAFT  05/30/1990   CABG X 7   CYSTOSCOPY N/A 01/07/2022   Procedure: CYSTOSCOPY, BLADDER BIOPSY WITH FULGERATION BILATERAL RETROGRADE PYELOGRAM;  Surgeon: [name], MD;  Location: [facility];  Service: Urology;  Laterality: N/A;  45 MINUTES NEEDED FOR CASE  INTUBATED PARALYZED   CYSTOSCOPY WITH BIOPSY N/A 07/23/2021   Procedure: CYSTOSCOPY WITH  BLADDER BIOPSY AND BILATERAL RETROGRADE PYELOGRAM;  Surgeon: [name], MD;  Location: [facility];  Service: Urology;  Laterality: N/A;   ENDOVASCULAR STENT INSERTION  02/15/2011   Procedure: ENDOVASCULAR STENT GRAFT INSERTION;  Surgeon: Nilda Simmer, MD;  Location: [facility] OR;  Service: Vascular;  Laterality: N/A;  Insertion of endovascular stent graft    ETT  10/31/2000   WNL   HIATAL HERNIA REPAIR  02/28/1990   "repaired when I had my [facility]"   Hospital - Afib  03/28/2002   INGUINAL HERNIA REPAIR Left  03/01/1995   TOTAL ANKLE ARTHROPLASTY Right 04/17/2014   Procedure: RIGHT TOTAL ANKLE ARTHOPLASTY;  Surgeon: Toni Arthurs, MD;  Location: [facility] OR;  Service: Orthopedics;  Laterality: Right;   TRANSURETHRAL RESECTION OF BLADDER TUMOR  03/25/2011   Procedure: TRANSURETHRAL RESECTION OF BLADDER TUMOR (TURBT);  Surgeon: Garnett Farm, MD;  Location: [facility];  Service: Urology;  Laterality: N/A;   Family History  Problem Relation Age of Onset   Obesity Mother 79       deceased   Hypertension Mother    Diabetes Mother    Heart disease Mother        before age 3   Hyperlipidemia Mother    Alcohol abuse Sister 63  deceased   Other Father        deceased unknown   Colon cancer Neg Hx    Prostate cancer Neg Hx    Anesthesia problems Neg Hx    Social History   Socioeconomic History   Marital status: Widowed    Spouse name: Not on file   Number of children: Not on file   Years of education: Not on file   Highest education level: Not on file  Occupational History   Occupation: retired Water quality scientist.    Employer: retired    Comment: 2001. Likes to play golf . Travels with motor home  Tobacco Use   Smoking status: Former    Current packs/day: 0.00    Average packs/day: 1.3 packs/day for 30.0 years (37.5 ttl pk-yrs)    Types: Cigarettes    Start date: 02/29/1960    Quit date: 02/28/1990    Years since quitting: 32.8   Smokeless tobacco: Never  Vaping Use   Vaping status: Never Used  Substance and Sexual Activity   Alcohol use: No    Alcohol/week: 0.0 standard drinks of alcohol    Comment: basically no alcohol use.  only very rare alcohol use.    Drug use: No   Sexual activity: Yes  Other Topics Concern   Not on file  Social History Narrative   From [place]   Lives with girlfriend.     Divorced, remarried then widowed, 2 children alive- (prev with 3 children- from 1st marriage)   Likes to golf   Prev 4 sport athlete in HS, football fan.     Social Determinants of Health   Financial Resource Strain: Low Risk  (01/03/2023)   Overall Financial Resource Strain (CARDIA)    Difficulty of Paying Living Expenses: Not hard at all  Food Insecurity: No Food Insecurity (01/03/2023)   Hunger Vital Sign    Worried About Running Out of Food in the Last Year: Never true    Ran Out of Food in the Last Year: Never true  Transportation Needs: No Transportation Needs (01/03/2023)   PRAPARE - Administrator, Civil Service (Medical): No    Lack of Transportation (Non-Medical): No  Physical Activity: Inactive (01/03/2023)   Exercise Vital Sign    Days of Exercise per Week: 0 days    Minutes of Exercise per Session: 0 min  Stress: No Stress Concern Present (01/03/2023)   [employer] of Occupational Health - Occupational Stress Questionnaire    Feeling of Stress : Not at all  Social Connections: Socially Isolated (01/03/2023)   Social Connection and Isolation Panel [NHANES]    Frequency of Communication with Friends and Family: Three times a week    Frequency of Social Gatherings with Friends and Family: Twice a week    Attends Religious Services: Never    Database administrator or Organizations: No    Attends Banker Meetings: Never    Marital Status: Widowed    Tobacco Counseling Counseling given: Not Answered  Clinical Intake:  Pre-visit preparation completed: No  Pain : No/denies pain    BMI - recorded: 35.84 Nutritional Status: BMI > 30  Obese Nutritional Risks: None Diabetes: No  How often do you need to have someone help you when you read instructions, pamphlets, or other written materials from your doctor or pharmacy?: 1 - Never  Interpreter Needed?: No  Comments: lives with partner Information entered by :: B.Azan Maneri,LPN   Activities of Daily Living  01/03/2023   11:35 AM  In your present state of health, do you have any difficulty performing the following activities:  Hearing? 0   Vision? 0  Difficulty concentrating or making decisions? 1  Comment remembering things  Walking or climbing stairs? 0  Dressing or bathing? 0  Doing errands, shopping? 0  Preparing Food and eating ? N  Using the Toilet? N  In the past six months, have you accidently leaked urine? N  Do you have problems with loss of bowel control? N  Managing your Medications? N  Managing your Finances? N  Housekeeping or managing your Housekeeping? N    Patient Care Team: Joaquim Nam, MD as PCP - General (Family Medicine) Tonny Bollman, MD as PCP - Cardiology (Cardiology) Laurey Morale, MD as Consulting Physician (Cardiology) Kennon Rounds as Physician Assistant (Cardiology) Cherlyn Cushing, RN as Oncology Nurse Navigator [name], [name], NP as Nurse Practitioner (Nurse Practitioner) [name], MD as Consulting Physician (Urology)  Indicate any recent Medical Services you may have received from other than [facility] providers in the past year (date may be approximate).     Assessment:   This is a routine wellness examination for [place].  Hearing/Vision screen Hearing Screening - Comments:: Pt says he can hear ok Vision Screening - Comments:: Pt says he wears glasses and sees ok Dr Dione Booze    Goals Addressed             This Visit's Progress    Patient Stated   On track    Starting 07/16/18, I will continue to take medications as prescribed.      Patient Stated       No goals:I am not into that anymore, I have no worries about that       Depression Screen    01/03/2023   11:31 AM 10/28/2022   11:46 AM 05/13/2022   11:17 AM 12/09/2021   12:43 PM 11/21/2020    3:20 PM 02/06/2020    3:32 PM 07/16/2018   11:09 AM  PHQ 2/9 Scores  PHQ - 2 Score 1 5 0 1 0 0 0  PHQ- 9 Score  14 0 3   0    Fall Risk    01/03/2023   11:25 AM 10/28/2022   11:45 AM 05/13/2022   11:17 AM 12/09/2021   12:38 PM 11/21/2020    3:27 PM  Fall Risk   Falls in the past year? 1 1 0  0 0  Number falls in past yr: 1 1 0 0 0  Injury with Fall? 0 0 0 0 0  Risk for fall due to : No Fall Risks No Fall Risks No Fall Risks    Follow up Falls prevention discussed;Education provided Falls evaluation completed Falls evaluation completed Falls evaluation completed;Education provided;Falls prevention discussed     MEDICARE RISK AT HOME: Medicare Risk at Home Any stairs in or around the home?: No If so, are there any without handrails?: No Home free of loose throw rugs in walkways, pet beds, electrical cords, etc?: Yes Adequate lighting in your home to reduce risk of falls?: Yes Life alert?: No Use of a cane, walker or w/c?: No Grab bars in the bathroom?: Yes Shower chair or bench in shower?: Yes Elevated toilet seat or a handicapped toilet?: Yes  TIMED UP AND GO:  Was the test performed?  No    Cognitive Function:    07/16/2018   11:55 AM 11/30/2015   11:27  AM  MMSE - Mini Mental State Exam  Orientation to time 5 5  Orientation to Place 5 5  Registration 3 3  Attention/ Calculation 0 0  Recall 3 2  Recall-comments  pt was unable to recall 1 of 3 words  Language- name 2 objects 0 0  Language- repeat 1 1  Language- follow 3 step command 0 3  Language- read & follow direction 0 0  Write a sentence 0 0  Copy design 0 0  Total score 17 19        01/03/2023   11:37 AM 12/09/2021   12:38 PM 11/21/2020    3:38 PM  6CIT Screen  What Year? 0 points 0 points 0 points  What month? 0 points 0 points 0 points  What time? 0 points 0 points 0 points  Count back from 20 0 points 0 points 0 points  Months in reverse 4 points 4 points 0 points  Repeat phrase 0 points 0 points 2 points  Total Score 4 points 4 points 2 points    Immunizations Immunization History  Administered Date(s) Administered   Fluad Quad(high Dose 65+) 02/06/2020   Influenza Split 12/28/2010, 12/29/2011   Influenza Whole 01/09/2006, 12/18/2008, 10/27/2009   Influenza,inj,Quad PF,6+ Mos  12/11/2012, 11/22/2013, 11/26/2014, 11/30/2015, 11/29/2018   Influenza-Unspecified 01/28/2017, 11/28/2017   PFIZER(Purple Top)SARS-COV-2 Vaccination 04/13/2019, 05/08/2019, 01/06/2020   Pfizer Covid-19 Vaccine Bivalent Booster 41yrs & up 12/26/2020   Pneumococcal Conjugate-13 11/26/2014   Pneumococcal Polysaccharide-23 03/01/1999, 12/28/2010   Td 11/28/1996, 09/27/2006   Zoster, Live 04/29/2010    TDAP status: Up to date  Flu Vaccine status: Declined, Education has been provided regarding the importance of this vaccine but patient still declined. Advised may receive this vaccine at local pharmacy or Health Dept. Aware to provide a copy of the vaccination record if obtained from local pharmacy or Health Dept. Verbalized acceptance and understanding.  Pneumococcal vaccine status: Up to date  Covid-19 vaccine status: Completed vaccines  Qualifies for Shingles Vaccine? Yes   Zostavax completed No   Shingrix Completed?: No.    Education has been provided regarding the importance of this vaccine. Patient has been advised to call insurance company to determine out of pocket expense if they have not yet received this vaccine. Advised may also receive vaccine at local pharmacy or Health Dept. Verbalized acceptance and understanding.  Screening Tests Health Maintenance  Topic Date Due   Zoster Vaccines- Shingrix (1 of 2) 02/20/1953   DTaP/Tdap/Td (3 - Tdap) 09/26/2016   COVID-19 Vaccine (5 - 2023-24 season) 10/30/2022   INFLUENZA VACCINE  05/29/2023 (Originally 09/29/2022)   Pneumonia Vaccine 26+ Years old  Completed   HPV VACCINES  Aged Out    Health Maintenance  Health Maintenance Due  Topic Date Due   Zoster Vaccines- Shingrix (1 of 2) [birth year]20/1953   DTaP/Tdap/Td (3 - Tdap) 09/26/2016   COVID-19 Vaccine (5 - 2023-24 season) 10/30/2022    Colorectal cancer screening: No longer required.   Lung Cancer Screening: (Low Dose CT Chest recommended if Age 74-80 years, 20 pack-year currently  smoking OR have quit w/in 15years.) does not qualify.   Lung Cancer Screening Referral: no  Additional Screening:  Hepatitis C Screening: does not qualify; Completed no  Vision Screening: Recommended annual ophthalmology exams for early detection of glaucoma and other disorders of the eye. Is the patient up to date with their annual eye exam?  Yes  Who is the provider or what is the name of the office  in which the patient attends annual eye exams? Dr Gloris Manchester due soon If pt is not established with a provider, would they like to be referred to a provider to establish care? No .   Dental Screening: Recommended annual dental exams for proper oral hygiene  Diabetic Foot Exam: n/a  Community Resource Referral / Chronic Care Management: CRR required this visit?  No   CCM required this visit?  No    Plan:     I have personally reviewed and noted the following in the patient's chart:   Medical and social history Use of alcohol, tobacco or illicit drugs  Current medications and supplements including opioid prescriptions. Patient is not currently taking opioid prescriptions. Functional ability and status Nutritional status Physical activity Advanced directives List of other physicians Hospitalizations, surgeries, and ER visits in previous 12 months Vitals Screenings to include cognitive, depression, and falls Referrals and appointments  In addition, I have reviewed and discussed with patient certain preventive protocols, quality metrics, and best practice recommendations. A written personalized care plan for preventive services as well as general preventive health recommendations were provided to patient.    Sue Lush, LPN   16/02/958   After Visit Summary: (MyChart) Due to this being a telephonic visit, the after visit summary with patients personalized plan was offered to patient via MyChart   Nurse Notes: The patient states he is doing well and has no concerns or  questions at this time.

## 2023-01-03 NOTE — Patient Instructions (Signed)
[name] , Thank you for taking time to come for your Medicare Wellness Visit. I appreciate your ongoing commitment to your health goals. Please review the following plan we discussed and let me know if I can assist you in the future.   Referrals/Orders/Follow-Ups/Clinician Recommendations: none  This is a list of the screening recommended for you and due dates:  Health Maintenance  Topic Date Due   COVID-19 Vaccine (5 - 2023-24 season) 01/19/2023*   Zoster (Shingles) Vaccine (1 of 2) 04/05/2023*   Flu Shot  05/29/2023*   DTaP/Tdap/Td vaccine (3 - Tdap) 01/03/2024*   Pneumonia Vaccine  Completed   HPV Vaccine  Aged Out  *Topic was postponed. The date shown is not the original due date.    Advanced directives: (In Chart) A copy of your advanced directives are scanned into your chart should your provider ever need it.  Next Medicare Annual Wellness Visit scheduled for next year: Yes 01/04/24 @ 1pm telephone

## 2023-01-17 DIAGNOSIS — C61 Malignant neoplasm of prostate: Secondary | ICD-10-CM | POA: Diagnosis not present

## 2023-01-23 ENCOUNTER — Other Ambulatory Visit: Payer: Self-pay

## 2023-01-24 ENCOUNTER — Other Ambulatory Visit: Payer: Self-pay

## 2023-01-24 ENCOUNTER — Other Ambulatory Visit: Payer: Self-pay | Admitting: Hematology

## 2023-01-24 ENCOUNTER — Other Ambulatory Visit (HOSPITAL_COMMUNITY): Payer: Self-pay

## 2023-01-24 DIAGNOSIS — R3912 Poor urinary stream: Secondary | ICD-10-CM | POA: Diagnosis not present

## 2023-01-24 DIAGNOSIS — C61 Malignant neoplasm of prostate: Secondary | ICD-10-CM | POA: Diagnosis not present

## 2023-01-24 DIAGNOSIS — C678 Malignant neoplasm of overlapping sites of bladder: Secondary | ICD-10-CM | POA: Diagnosis not present

## 2023-01-24 MED ORDER — ABIRATERONE ACETATE 250 MG PO TABS
250.0000 mg | ORAL_TABLET | Freq: Every day | ORAL | 0 refills | Status: DC
  Filled 2023-01-24: qty 30, 30d supply, fill #0

## 2023-01-24 NOTE — Progress Notes (Signed)
Specialty Pharmacy Refill Coordination Note  [name] is a 89 y.o. male contacted today regarding refills of specialty medication(s) Abiraterone Acetate   Patient requested [name] at [facility] at [place] date: 02/07/23   Medication will be filled on 02/06/23. *Pending refill request*

## 2023-01-24 NOTE — Progress Notes (Signed)
Specialty Pharmacy Ongoing Clinical Assessment Note  [name] is a 89 y.o. male who is being followed by the specialty pharmacy service for RxSp Oncology   Patient's specialty medication(s) reviewed today: Abiraterone Acetate   Missed doses in the last 4 weeks: 0   Patient/Caregiver did not have any additional questions or concerns.   No data recorded  Adverse events/side effects summary: No adverse events/side effects   Patient's therapy is appropriate to: Continue    Goals Addressed             This Visit's Progress    Slow Disease Progression       Patient is on track. Patient will maintain adherence.  PSA remains <0.1 ng/mL.          Follow up:  6 months  Servando Snare Specialty Pharmacist

## 2023-02-01 ENCOUNTER — Other Ambulatory Visit: Payer: Self-pay | Admitting: Family Medicine

## 2023-02-06 ENCOUNTER — Other Ambulatory Visit: Payer: Self-pay

## 2023-02-07 ENCOUNTER — Other Ambulatory Visit (HOSPITAL_COMMUNITY): Payer: Self-pay

## 2023-02-10 DIAGNOSIS — C61 Malignant neoplasm of prostate: Secondary | ICD-10-CM | POA: Diagnosis not present

## 2023-02-14 ENCOUNTER — Ambulatory Visit: Payer: Self-pay

## 2023-02-24 ENCOUNTER — Other Ambulatory Visit: Payer: Self-pay | Admitting: Family Medicine

## 2023-02-27 ENCOUNTER — Other Ambulatory Visit: Payer: Self-pay | Admitting: Family Medicine

## 2023-02-28 ENCOUNTER — Other Ambulatory Visit: Payer: Self-pay

## 2023-02-28 ENCOUNTER — Other Ambulatory Visit (HOSPITAL_COMMUNITY): Payer: Self-pay

## 2023-02-28 ENCOUNTER — Other Ambulatory Visit: Payer: Self-pay | Admitting: Hematology

## 2023-02-28 DIAGNOSIS — C61 Malignant neoplasm of prostate: Secondary | ICD-10-CM

## 2023-02-28 MED ORDER — ABIRATERONE ACETATE 250 MG PO TABS
250.0000 mg | ORAL_TABLET | Freq: Every day | ORAL | 0 refills | Status: DC
  Filled 2023-02-28: qty 30, 30d supply, fill #0

## 2023-02-28 NOTE — Progress Notes (Signed)
 Specialty Pharmacy Refill Coordination Note  [name] is a 89 y.o. male contacted today regarding refills of specialty medication(s) Abiraterone  Acetate (ZYTIGA )   Patient requested Marylyn at [facility] at [place] date: 03/07/23   Medication will be filled on 01.06.25.  Refill request pending

## 2023-03-06 ENCOUNTER — Ambulatory Visit
Admission: RE | Admit: 2023-03-06 | Discharge: 2023-03-06 | Disposition: A | Discharge status: Home / Self Care | Payer: Medicare Other | Source: Ambulatory Visit | Attending: Emergency Medicine | Admitting: Emergency Medicine

## 2023-03-06 ENCOUNTER — Other Ambulatory Visit: Payer: Self-pay

## 2023-03-06 VITALS — BP 144/86 | HR 92 | Temp 98.1°F | Resp 18

## 2023-03-06 DIAGNOSIS — L02212 Cutaneous abscess of back [any part, except buttock]: Secondary | ICD-10-CM

## 2023-03-06 DIAGNOSIS — C679 Malignant neoplasm of bladder, unspecified: Secondary | ICD-10-CM

## 2023-03-06 DIAGNOSIS — C61 Malignant neoplasm of prostate: Secondary | ICD-10-CM

## 2023-03-06 MED ORDER — DOXYCYCLINE HYCLATE 100 MG PO CAPS: 100.0000 mg | ORAL_CAPSULE | Freq: Two times a day (BID) | ORAL | 0 refills | Status: AC

## 2023-03-06 NOTE — Discharge Instructions (Addendum)
 Take the doxycycline as directed.  Follow-up with a dermatologist or your primary care provider.

## 2023-03-06 NOTE — ED Provider Notes (Signed)
 [name]    CSN: [id number] Arrival date & time: 03/06/23  1248      History   Chief Complaint Chief Complaint  Patient presents with   Back Pain    Back cysts with puss and blood discharge - Entered by patient   Abscess    HPI [name] is a 89 y.o. male.  Patient presents with an abscess on his right mid back for several years that has gotten worse in the last week.  It is open and draining pus.  No fever or chills.  His girlfriend, who is with him today, has been manually expressing the pus.    The history is provided by the patient and medical records.    Past Medical History:  Diagnosis Date   AAA (abdominal aortic aneurysm) (HCC)    s/p stent graft repair 2012   Arthritis    right ankle (04/17/2014)   Ascending aorta dilatation (HCC)    Echo 2021: 42 mm // Echocardiogram 10/22: EF 55-60, no RWMA, mildly reduced RVSF, RVSP 39.7, severe BAE, trivial MR, mod TR, trivial AI, mild AS (mean 8), ascending aorta 40 mm   Atrial fibrillation (HCC)    a. permanent;  b. Hematuria on Pradaxa ;  c. cold intol and chills with Xarelto    Bladder cancer (HCC)    Cardiomyopathy (HCC)    a. Echo (03/2013): Inferior and inferolateral AK, EF 40-45%, mild MR, mod LAE, mod RAE, mod TR, mild PI, PASP 46   Chronic kidney disease    COPD (chronic obstructive pulmonary disease) (HCC)    Coronary artery disease s/p cabg x7  1992   a. s/p CABG in 1992 (X 7 - Dr. Tanda);  b.  Myoview  (01/2011): Fixed inferior defect suggestive of prior MI versus diaphragmatic attenuation, apical reversibility possibly suspicious for small area of infarction with peri-infarct ischemia, no significant change from prior study, not gated, low risk study;  c.  Echo (05/2010): Mod LVH, EF 55-65%, mild AI, mild MR, moderate LAE, mild RAE, moderate    Hematuria bladder tumor ---  followed by Dr Ottelin   Hiatal hernia    repaired in 1992 w/[facility]   Hx of cardiovascular stress test    Lexiscan  Myoview   (03/2013):  No ischemia; not gated.   Hyperglycemia diet controlled   Hyperlipidemia    Hypertension    Hypothyroidism    Obesity    Organic impotence    Prostate cancer (HCC)    RBBB    S/P AAA repair using bifurcation graft    Sleep apnea no rx cpap   mild- tested  2012   Ventricular ectopy    mild    Patient Active Problem List   Diagnosis Date Noted   SOB (shortness of breath) 10/31/2022   Malignant neoplasm of prostate metastatic to intra-abdominal lymph node (HCC) 06/29/2021   Malignant neoplasm of prostate metastatic to intrapelvic lymph node (HCC) 06/29/2021   Ascending aorta dilatation (HCC) 12/09/2020   Health care maintenance 07/18/2018   Atrial fibrillation (HCC)    Chronic systolic CHF (congestive heart failure) (HCC) 11/29/2014   Arthritis of right ankle 04/17/2014   Advance care planning 11/24/2013   Cardiomyopathy, ischemic 06/17/2013   AAA (abdominal aortic aneurysm) without rupture (HCC) 11/02/2012   Medicare annual wellness visit, subsequent 12/30/2011   Bladder cancer (HCC) 07/29/2011   Intermittent claudication (HCC) 04/29/2011   Hyperlipidemia 06/13/2010   ORGANIC IMPOTENCE 04/29/2010   Hypothyroidism 06/29/2006   OBESITY 06/29/2006   Essential hypertension  06/29/2006   Coronary artery disease involving native coronary artery of native heart without angina pectoris 06/29/2006   Hyperglycemia 06/29/2006    Past Surgical History:  Procedure Laterality Date   ABDOMINAL AORTIC ANEURYSM REPAIR  02/28/2010   using bifurcation graft   CARDIAC CATHETERIZATION  03/31/1990   Positive   CARDIOVASCULAR STRESS TEST  02-01-2011  NUCLEAR NO EXERCISE   LOW RISK STUDY.  SMALL APICAL DEFECT. NO EVIDENCE OF MULTIZONE ISCHEMIA.   CORONARY ARTERY BYPASS GRAFT  05/30/1990   CABG X 7   CYSTOSCOPY N/A 01/07/2022   Procedure: CYSTOSCOPY, BLADDER BIOPSY WITH FULGERATION BILATERAL RETROGRADE PYELOGRAM;  Surgeon: Selma Donnice SAUNDERS, MD;  Location: [facility];  Service: Urology;   Laterality: N/A;  45 MINUTES NEEDED FOR CASE  INTUBATED PARALYZED   CYSTOSCOPY WITH BIOPSY N/A 07/23/2021   Procedure: CYSTOSCOPY WITH  BLADDER BIOPSY AND BILATERAL RETROGRADE PYELOGRAM;  Surgeon: Selma Donnice SAUNDERS, MD;  Location: [facility];  Service: Urology;  Laterality: N/A;   ENDOVASCULAR STENT INSERTION  02/15/2011   Procedure: ENDOVASCULAR STENT GRAFT INSERTION;  Surgeon: Redell Lurena Door, MD;  Location: [facility];  Service: Vascular;  Laterality: N/A;  Insertion of endovascular stent graft    ETT  10/31/2000   WNL   HIATAL HERNIA REPAIR  02/28/1990   repaired when I had my [facility]   Hospital - Afib  03/28/2002   INGUINAL HERNIA REPAIR Left 03/01/1995   TOTAL ANKLE ARTHROPLASTY Right 04/17/2014   Procedure: RIGHT TOTAL ANKLE ARTHOPLASTY;  Surgeon: Norleen Armor, MD;  Location: [facility] OR;  Service: Orthopedics;  Laterality: Right;   TRANSURETHRAL RESECTION OF BLADDER TUMOR  03/25/2011   Procedure: TRANSURETHRAL RESECTION OF BLADDER TUMOR (TURBT);  Surgeon: Mark C Ottelin, MD;  Location[facility];  Service: Urology;  Laterality: N/A;       Home Medications    Prior to Admission medications   Medication Sig Start Date End Date Taking? Authorizing Provider  doxycycline  (VIBRAMYCIN ) 100 MG capsule Take 1 capsule (100 mg total) by mouth 2 (two) times daily for 7 days. 03/06/23 03/13/23 Yes Corlis Burnard DEL, NP  abiraterone  acetate (ZYTIGA ) 250 MG tablet Take 1 tablet (250 mg total) by mouth daily. Take on an empty stomach 1 hour before or 2 hours after a meal 02/28/23   Onesimo Emaline Brink, MD  atorvastatin  (LIPITOR) 40 MG tablet TAKE ONE TABLET BY MOUTH ONCE A DAY 02/27/23   Cleatus Arlyss RAMAN, MD  docusate sodium  (COLACE) 100 MG capsule Take 1 capsule (100 mg total) by mouth daily as needed for up to 30 doses. 01/07/22   Selma Donnice SAUNDERS, MD  ELIQUIS  5 MG TABS tablet TAKE ONE TABLET BY MOUTH TWICE (2) DAILY 11/17/22   Cooper, Michael, MD  finasteride (PROSCAR) 5 MG tablet Take 5 mg by mouth  daily.    [provider]  furosemide  (LASIX ) 20 MG tablet TAKE ONE TABLET (20 MG TOTAL) BY MOUTH TWO TIMES DAILY. 12/23/22   Onesimo Emaline Brink, MD  levothyroxine  (SYNTHROID ) 137 MCG tablet TAKE ONE TABLET BY MOUTH ONCE A DAY ON AN EMPTY STOMACH 06/06/22   Cleatus Arlyss RAMAN, MD  predniSONE  (DELTASONE ) 5 MG tablet TAKE ONE TABLET (5 MG TOTAL) BY MOUTH DAILY WITH BREAKFAST. 12/23/22   Onesimo Emaline Brink, MD  ramipril  (ALTACE ) 5 MG capsule TAKE ONE CAPSULE BY MOUTH TWO TIMES DAILY 02/02/23   Cleatus Arlyss RAMAN, MD  tamsulosin (FLOMAX) 0.4 MG CAPS capsule Take 0.4 mg by mouth at bedtime.    [provider]    Family History Family History  Problem Relation Age of Onset   Obesity Mother 59       deceased   Hypertension Mother    Diabetes Mother    Heart disease Mother        before age 60   Hyperlipidemia Mother    Alcohol abuse Sister 8       deceased   Other Father        deceased unknown   Colon cancer Neg Hx    Prostate cancer Neg Hx    Anesthesia problems Neg Hx     Social History Social History   Tobacco Use   Smoking status: Former    Current packs/day: 0.00    Average packs/day: 1.3 packs/day for 30.0 years (37.5 ttl pk-yrs)    Types: Cigarettes    Start date: 02/29/1960    Quit date: 02/28/1990    Years since quitting: 33.0   Smokeless tobacco: Never  Vaping Use   Vaping status: Never Used  Substance Use Topics   Alcohol use: No    Alcohol/week: 0.0 standard drinks of alcohol    Comment: basically no alcohol use.  only very rare alcohol use.    Drug use: No     Allergies   Dabigatran  etexilate mesylate, Xarelto  [rivaroxaban ], and Metoprolol    Review of Systems Review of Systems  Constitutional:  Negative for chills and fever.  Skin:  Positive for color change and wound.     Physical Exam Triage Vital Signs ED Triage Vitals  Encounter Vitals Group     BP 03/06/23 1333 (!) 144/86     Systolic BP Percentile --      Diastolic BP  Percentile --      Pulse Rate 03/06/23 1311 92     Resp 03/06/23 1311 18     Temp 03/06/23 1311 98.1 F (36.7 C)     Temp src --      SpO2 03/06/23 1311 99 %     Weight --      Height --      Head Circumference --      Peak Flow --      Pain Score 03/06/23 1326 0     Pain Loc --      Pain Education --      Exclude from Growth Chart --    No data found.  Updated Vital Signs BP (!) 144/86   Pulse 92   Temp 98.1 F (36.7 C)   Resp 18   SpO2 99%   Visual Acuity Right Eye Distance:   Left Eye Distance:   Bilateral Distance:    Right Eye Near:   Left Eye Near:    Bilateral Near:     Physical Exam Constitutional:      General: He is not in acute distress. HENT:     Mouth/Throat:     Mouth: Mucous membranes are moist.  Cardiovascular:     Rate and Rhythm: Normal rate and regular rhythm.  Pulmonary:     Effort: Pulmonary effort is normal. No respiratory distress.  Skin:    General: Skin is warm and dry.     Findings: Erythema and lesion present.     Comments: 7 cm x 3 cm area of induration with open draining wound; small amount of purulent and bloody drainage.    Neurological:     Mental Status: He is alert.      UC Treatments / Results  Labs (all labs ordered are listed, but only abnormal results are displayed) Labs Reviewed - No data to display  EKG   Radiology No results found.  Procedures Procedures (including critical care time)  Medications Ordered in UC Medications - No data to display  Initial Impression / Assessment and Plan / UC Course  I have reviewed the triage vital signs and the nursing notes.  Pertinent labs & imaging results that were available during my care of the patient were reviewed by me and considered in my medical decision making (see chart for details).    This of back.  No I&D indicated at this time as the abscess is open and draining.  Treating today with doxycycline .  Instructed patient to follow-up with a dermatologist  or his PCP.  Contact information for [facility] provided.  Education provided on abscess.  He agrees to plan of care.  Final Clinical Impressions(s) / UC Diagnoses   Final diagnoses:  Abscess of back     Discharge Instructions      Take the doxycycline  as directed.  Follow-up with a dermatologist or your primary care provider.      ED Prescriptions     Medication Sig Dispense Auth. Provider   doxycycline  (VIBRAMYCIN ) 100 MG capsule Take 1 capsule (100 mg total) by mouth 2 (two) times daily for 7 days. 14 capsule Corlis Burnard DEL, NP      PDMP not reviewed this encounter.   Corlis Burnard DEL, NP 03/06/23 [phone]

## 2023-03-06 NOTE — ED Triage Notes (Signed)
 Patient to Urgent Care with complaints of an two abscess present to his middle back. Denies any pain, some itching. Denies any fevers.   Reports he has had the areas for multiple years. Worsened approx one week ago. Now draining purulent drainage/ blood.

## 2023-03-07 ENCOUNTER — Inpatient Hospital Stay (HOSPITAL_BASED_OUTPATIENT_CLINIC_OR_DEPARTMENT_OTHER): Payer: Medicare Other | Admitting: Hematology

## 2023-03-07 ENCOUNTER — Inpatient Hospital Stay: Payer: Medicare Other | Attending: Hematology

## 2023-03-07 ENCOUNTER — Other Ambulatory Visit (HOSPITAL_COMMUNITY): Payer: Self-pay

## 2023-03-07 VITALS — BP 161/78 | HR 79 | Temp 97.0°F | Resp 18 | Wt 263.7 lb

## 2023-03-07 DIAGNOSIS — C679 Malignant neoplasm of bladder, unspecified: Secondary | ICD-10-CM

## 2023-03-07 DIAGNOSIS — C7951 Secondary malignant neoplasm of bone: Secondary | ICD-10-CM | POA: Diagnosis not present

## 2023-03-07 DIAGNOSIS — C775 Secondary and unspecified malignant neoplasm of intrapelvic lymph nodes: Secondary | ICD-10-CM | POA: Diagnosis not present

## 2023-03-07 DIAGNOSIS — C772 Secondary and unspecified malignant neoplasm of intra-abdominal lymph nodes: Secondary | ICD-10-CM

## 2023-03-07 DIAGNOSIS — C61 Malignant neoplasm of prostate: Secondary | ICD-10-CM

## 2023-03-07 DIAGNOSIS — Z87891 Personal history of nicotine dependence: Secondary | ICD-10-CM | POA: Diagnosis not present

## 2023-03-07 LAB — CBC WITH DIFFERENTIAL (CANCER CENTER ONLY)
Abs Immature Granulocytes: 0.05 10*3/uL (ref 0.00–0.07)
Basophils Absolute: 0.1 10*3/uL (ref 0.0–0.1)
Basophils Relative: 1 %
Eosinophils Absolute: 0.3 10*3/uL (ref 0.0–0.5)
Eosinophils Relative: 4 %
HCT: 41.7 % (ref 39.0–52.0)
Hemoglobin: 13.3 g/dL (ref 13.0–17.0)
Immature Granulocytes: 1 %
Lymphocytes Relative: 22 %
Lymphs Abs: 1.6 10*3/uL (ref 0.7–4.0)
MCH: 29.2 pg (ref 26.0–34.0)
MCHC: 31.9 g/dL (ref 30.0–36.0)
MCV: 91.4 fL (ref 80.0–100.0)
Monocytes Absolute: 0.7 10*3/uL (ref 0.1–1.0)
Monocytes Relative: 9 %
Neutro Abs: 4.8 10*3/uL (ref 1.7–7.7)
Neutrophils Relative %: 63 %
Platelet Count: 221 10*3/uL (ref 150–400)
RBC: 4.56 MIL/uL (ref 4.22–5.81)
RDW: 13.6 % (ref 11.5–15.5)
WBC Count: 7.5 10*3/uL (ref 4.0–10.5)
nRBC: 0 % (ref 0.0–0.2)

## 2023-03-07 LAB — CMP (CANCER CENTER ONLY)
ALT: 11 U/L (ref 0–44)
AST: 22 U/L (ref 15–41)
Albumin: 3.8 g/dL (ref 3.5–5.0)
Alkaline Phosphatase: 132 U/L — ABNORMAL HIGH (ref 38–126)
Anion gap: 5 (ref 5–15)
BUN: 11 mg/dL (ref 8–23)
CO2: 31 mmol/L (ref 22–32)
Calcium: 9.4 mg/dL (ref 8.9–10.3)
Chloride: 104 mmol/L (ref 98–111)
Creatinine: 0.99 mg/dL (ref 0.61–1.24)
GFR, Estimated: >60 mL/min (ref >60)
Glucose, Bld: 135 mg/dL — ABNORMAL HIGH (ref 70–99)
Potassium: 3.9 mmol/L (ref 3.5–5.1)
Sodium: 140 mmol/L (ref 135–145)
Total Bilirubin: 0.8 mg/dL (ref 0.0–1.2)
Total Protein: 7.1 g/dL (ref 6.5–8.1)

## 2023-03-08 LAB — PROSTATE-SPECIFIC AG, SERUM (LABCORP): Prostate Specific Ag, Serum: <0.1 ng/mL (ref 0.0–4.0)

## 2023-03-13 NOTE — Progress Notes (Signed)
 HEMATOLOGY/ONCOLOGY CLINIC NOTE  Date of Service:  03/13/23   Patient Care Team: Cleatus Arlyss RAMAN, MD as PCP - General (Family Medicine) Wonda Sharper, MD as PCP - Cardiology (Cardiology) Rolan Ezra RAMAN, MD as Consulting Physician (Cardiology) Lelon Glendia ONEIDA DEVONNA as Physician Assistant (Cardiology) Vertell Pont, RN as Oncology Nurse Navigator [name], Fannie SAILOR, NP as Nurse Practitioner (Nurse Practitioner) Selma Donnice SAUNDERS, MD as Consulting Physician (Urology) [facility] (Ophthalmology)  CHIEF COMPLAINTS/PURPOSE OF CONSULTATION:   Malignant neoplasm of prostate metastatic to intra-abdominal lymph node [facility])   Prior Therapy: CT-guided biopsy of the left pelvic lymph nodes on June 02, 2021 showed metastatic carcinoma staining positive for PSA.   Current therapy:   Eligard  45 mg every 6 months started in June 2023.  He is currently receiving that under the care of [facility].   Zytiga  1000 mg with prednisone  5 mg started on August 03, 2021.  His dose was reduced to 500 mg daily dose starting in September 2023.and dpwn to 500mg  po daily from 12/20/2022  HISTORY OF PRESENTING ILLNESS:   [name] is a wonderful 89 y.o. male who is here for continued evaluation and management of Malignant neoplasm of prostate metastatic to intra-abdominal lymph node (HCC). Patient was getting followed up by Dr. Amadeo. Patient is accompanied by his daughter during this visit.   Patient was initially diagnosed with advanced prostate cancer in May 2023. He also had castration-sensitive with lymphadenopathy and bone disease, PSA with 24 at the time of diagnosis. He was also previously diagnosed with superficial bladder tumor in 2012 and developed relapsed disease in May 2023 with CIS.  Patient's current treatment includes Eligard  45 mg every 6 months which was started in June 2023 and Zytiga  500 mg with Prednisone  5 mg. He previously on Zytiga  1000 mg from August 03, 2021 to September 2023. Reduced to 500 mg in September 2023.   Patient was last seen by Dr. Amadeo on 01/18/2022 and he was doing well overall. He denied of any toxicities with Zytiga  after reducing the dosage. During the last visit, Foley catheter was removed.   He reports he has been doing well overall since his last visit with Dr. Amadeo. He denies fever, chills, night sweats, new lumps/bumps, urinary problems, abnormal bowel movement, unexpected weight loss, bone pain, abdominal pain, chest pain, back pain. However, he does complain of increased fatigue, occasional bilateral leg swelling, occasional shortness of breath, and occasional constipation. His fatigue is affecting his daily activities, such as getting tired making breakfast. He notes that his fatigue causes bilateral leg weakness.  Patient's daughter notes that the patient recently had sinus infection since the last visit. He has recovered well from the infection. His daughter also reports that the patient has decreased appetite, but his weight has been stable.   He has dentures and no other dental issues.   He is complaint with all of his medications.   Patient's daughter notes that he has a blister on his right feet sole, which is affecting his walking and movement.   INTERVAL HISTORY:  [name] is a wonderful 89 y.o. male who is here for continued evaluation and management of Malignant neoplasm of prostate metastatic to intra-abdominal lymph node (HCC).   Patient was last seen by me on 12/20/2022 and he complained of worsened fatigue, lethargy, insomnia, nasal congestion, and SOB.   Patient is accompanied by his wife and his son during this visit. Patient notes he has  been doing well overall since our last visit. He has been prescribed lasix , which has improved his leg swelling. Patient takes Lasix  once daily.   Patient notes he recently had a visit with his Cardiologist and notes that he is planning on having  Echocardiogram.   Patient is unsure when he had his last Eligard  injection. He will follow-up with his Urologist.   He denies any new infection issues, fever, chills, night sweats, new respiratory symptoms, SOB, unexpected weight loss, back pain, chest pain.   Patient notes that his appetite and energy have improved since our last visit and since his Zytiga  dose was reduced to 500mg  po daily.   MEDICAL HISTORY:  Past Medical History:  Diagnosis Date   AAA (abdominal aortic aneurysm) (HCC)    s/p stent graft repair 2012   Arthritis    right ankle (04/17/2014)   Ascending aorta dilatation (HCC)    Echo 2021: 42 mm // Echocardiogram 10/22: EF 55-60, no RWMA, mildly reduced RVSF, RVSP 39.7, severe BAE, trivial MR, mod TR, trivial AI, mild AS (mean 8), ascending aorta 40 mm   Atrial fibrillation (HCC)    a. permanent;  b. Hematuria on Pradaxa ;  c. cold intol and chills with Xarelto    Bladder cancer (HCC)    Cardiomyopathy (HCC)    a. Echo (03/2013): Inferior and inferolateral AK, EF 40-45%, mild MR, mod LAE, mod RAE, mod TR, mild PI, PASP 46   Chronic kidney disease    COPD (chronic obstructive pulmonary disease) (HCC)    Coronary artery disease s/p cabg x7  1992   a. s/p CABG in 1992 (X 7 - Dr. Tanda);  b.  Myoview  (01/2011): Fixed inferior defect suggestive of prior MI versus diaphragmatic attenuation, apical reversibility possibly suspicious for small area of infarction with peri-infarct ischemia, no significant change from prior study, not gated, low risk study;  c.  Echo (05/2010): Mod LVH, EF 55-65%, mild AI, mild MR, moderate LAE, mild RAE, moderate    Hematuria bladder tumor ---  followed by Dr Ottelin   Hiatal hernia    repaired in 1992 w/OHS   Hx of cardiovascular stress test    Lexiscan  Myoview  (03/2013):  No ischemia; not gated.   Hyperglycemia diet controlled   Hyperlipidemia    Hypertension    Hypothyroidism    Obesity    Organic impotence    Prostate cancer (HCC)     RBBB    S/P AAA repair using bifurcation graft    Sleep apnea no rx cpap   mild- tested  2012   Ventricular ectopy    mild    SURGICAL HISTORY: Past Surgical History:  Procedure Laterality Date   ABDOMINAL AORTIC ANEURYSM REPAIR  02/28/2010   using bifurcation graft   CARDIAC CATHETERIZATION  03/31/1990   Positive   CARDIOVASCULAR STRESS TEST  02-01-2011  NUCLEAR NO EXERCISE   LOW RISK STUDY.  SMALL APICAL DEFECT. NO EVIDENCE OF MULTIZONE ISCHEMIA.   CORONARY ARTERY BYPASS GRAFT  05/30/1990   CABG X 7   CYSTOSCOPY N/A 01/07/2022   Procedure: CYSTOSCOPY, BLADDER BIOPSY WITH FULGERATION BILATERAL RETROGRADE PYELOGRAM;  Surgeon: Selma Donnice SAUNDERS, MD;  Location: [facility];  Service: Urology;  Laterality: N/A;  45 MINUTES NEEDED FOR CASE  INTUBATED PARALYZED   CYSTOSCOPY WITH BIOPSY N/A 07/23/2021   Procedure: CYSTOSCOPY WITH  BLADDER BIOPSY AND BILATERAL RETROGRADE PYELOGRAM;  Surgeon: Selma Donnice SAUNDERS, MD;  Location: [facility];  Service: Urology;  Laterality: N/A;   ENDOVASCULAR STENT INSERTION  02/15/2011   Procedure: ENDOVASCULAR STENT GRAFT INSERTION;  Surgeon: Redell Lurena Door, MD;  Location: [facility] OR;  Service: Vascular;  Laterality: N/A;  Insertion of endovascular stent graft    ETT  10/31/2000   [facility]NL   HIATAL HERNIA REPAIR  02/28/1990   repaired when I had my OHS   Hospital - Afib  03/28/2002   INGUINAL HERNIA REPAIR Left 03/01/1995   TOTAL ANKLE ARTHROPLASTY Right 04/17/2014   Procedure: RIGHT TOTAL ANKLE ARTHOPLASTY;  Surgeon: Norleen Armor, MD;  Location: [facility] OR;  Service: Orthopedics;  Laterality: Right;   TRANSURETHRAL RESECTION OF BLADDER TUMOR  03/25/2011   Procedure: TRANSURETHRAL RESECTION OF BLADDER TUMOR (TURBT);  Surgeon: Mark C Ottelin, MD;  Location: [facility];  Service: Urology;  Laterality: N/A;    SOCIAL HISTORY: Social History   Socioeconomic History   Marital status: Widowed    Spouse name: Not on file   Number of children: Not on file   Years  of education: Not on file   Highest education level: Not on file  Occupational History   Occupation: retired Water Quality Scientist.    Employer: retired    Comment: 2001. Likes to play golf . Travels with motor home  Tobacco Use   Smoking status: Former    Current packs/day: 0.00    Average packs/day: 1.3 packs/day for 30.0 years (37.5 ttl pk-yrs)    Types: Cigarettes    Start date: 02/29/1960    Quit date: 02/28/1990    Years since quitting: 33.0   Smokeless tobacco: Never  Vaping Use   Vaping status: Never Used  Substance and Sexual Activity   Alcohol use: No    Alcohol/week: 0.0 standard drinks of alcohol    Comment: basically no alcohol use.  only very rare alcohol use.    Drug use: No   Sexual activity: Yes  Other Topics Concern   Not on file  Social History Narrative   From [place]   Lives with girlfriend.     Divorced, remarried then widowed, 2 children alive- (prev with 3 children- from 1st marriage)   Likes to golf   Prev 4 sport athlete in HS, football fan.    Social Drivers of Corporate Investment Banker Strain: Low Risk  (01/03/2023)   Overall Financial Resource Strain (CARDIA)    Difficulty of Paying Living Expenses: Not hard at all  Food Insecurity: No Food Insecurity (01/03/2023)   Hunger Vital Sign    Worried About Running Out of Food in the Last Year: Never true    Ran Out of Food in the Last Year: Never true  Transportation Needs: No Transportation Needs (01/03/2023)   PRAPARE - Administrator, Civil Service (Medical): No    Lack of Transportation (Non-Medical): No  Physical Activity: Inactive (01/03/2023)   Exercise Vital Sign    Days of Exercise per Week: 0 days    Minutes of Exercise per Session: 0 min  Stress: No Stress Concern Present (01/03/2023)   [employer] of Occupational Health - Occupational Stress Questionnaire    Feeling of Stress : Not at all  Social Connections: Socially Isolated (01/03/2023)   Social Connection and  Isolation Panel [NHANES]    Frequency of Communication with Friends and Family: Three times a week    Frequency of Social Gatherings with Friends and Family: Twice a week    Attends Religious Services: Never    Database Administrator or Organizations: No    Attends  Club or Organization Meetings: Never    Marital Status: Widowed  Intimate Partner Violence: Not At Risk (01/03/2023)   Humiliation, Afraid, Rape, and Kick questionnaire    Fear of Current or Ex-Partner: No    Emotionally Abused: No    Physically Abused: No    Sexually Abused: No    FAMILY HISTORY: Family History  Problem Relation Age of Onset   Obesity Mother 80       deceased   Hypertension Mother    Diabetes Mother    Heart disease Mother        before age 10   Hyperlipidemia Mother    Alcohol abuse Sister 53       deceased   Other Father        deceased unknown   Colon cancer Neg Hx    Prostate cancer Neg Hx    Anesthesia problems Neg Hx     ALLERGIES:  is allergic to dabigatran  etexilate mesylate, xarelto  [rivaroxaban ], and metoprolol .  MEDICATIONS:  Current Outpatient Medications  Medication Sig Dispense Refill   abiraterone  acetate (ZYTIGA ) 250 MG tablet Take 1 tablet (250 mg total) by mouth daily. Take on an empty stomach 1 hour before or 2 hours after a meal 30 tablet 0   atorvastatin  (LIPITOR) 40 MG tablet TAKE ONE TABLET BY MOUTH ONCE A DAY 90 tablet 0   docusate sodium  (COLACE) 100 MG capsule Take 1 capsule (100 mg total) by mouth daily as needed for up to 30 doses. 30 capsule 0   doxycycline  (VIBRAMYCIN ) 100 MG capsule Take 1 capsule (100 mg total) by mouth 2 (two) times daily for 7 days. 14 capsule 0   ELIQUIS  5 MG TABS tablet TAKE ONE TABLET BY MOUTH TWICE (2) DAILY 60 tablet 5   finasteride (PROSCAR) 5 MG tablet Take 5 mg by mouth daily.     furosemide  (LASIX ) 20 MG tablet TAKE ONE TABLET (20 MG TOTAL) BY MOUTH TWO TIMES DAILY. 60 tablet 0   levothyroxine  (SYNTHROID ) 137 MCG tablet TAKE ONE  TABLET BY MOUTH ONCE A DAY ON AN EMPTY STOMACH 90 tablet 2   predniSONE  (DELTASONE ) 5 MG tablet TAKE ONE TABLET (5 MG TOTAL) BY MOUTH DAILY WITH BREAKFAST. 90 tablet 1   ramipril  (ALTACE ) 5 MG capsule TAKE ONE CAPSULE BY MOUTH TWO TIMES DAILY 180 capsule 1   tamsulosin (FLOMAX) 0.4 MG CAPS capsule Take 0.4 mg by mouth at bedtime.     No current facility-administered medications for this visit.    REVIEW OF SYSTEMS:    10 Point review of Systems was done is negative except as noted above.  PHYSICAL EXAMINATION: ECOG PERFORMANCE STATUS: 2 - Symptomatic, <50% confined to bed  . Vitals:   03/07/23 1358 03/07/23 1359  BP: (!) 161/78 (!) 161/78  Pulse: 79 79  Resp:  18  Temp:  (!) 97 F (36.1 C)  SpO2:  100%     Filed Weights   03/07/23 1358 03/07/23 1359  Weight: 263 lb 11.2 oz (119.6 kg) 263 lb 11.2 oz (119.6 kg)     .Body mass index is 36.78 kg/m.  GENERAL:alert, in no acute distress and comfortable SKIN: no acute rashes, no significant lesions EYES: conjunctiva are pink and non-injected, sclera anicteric OROPHARYNX: MMM, no exudates, no oropharyngeal erythema or ulceration NECK: supple, no JVD LYMPH:  no palpable lymphadenopathy in the cervical, axillary or inguinal regions LUNGS: clear to auscultation b/l with normal respiratory effort HEART: regular rate & rhythm ABDOMEN:  normoactive bowel  sounds , non tender, not distended. Extremity: no pedal edema PSYCH: alert & oriented x 3 with fluent speech NEURO: no focal motor/sensory deficits   LABORATORY DATA:  I have reviewed the data as listed .    Latest Ref Rng & Units 03/07/2023    1:25 PM 12/20/2022    1:43 PM 10/18/2022   11:50 AM  CBC  WBC 4.0 - 10.5 K/uL 7.5  6.2  6.6   Hemoglobin 13.0 - 17.0 g/dL 86.6  86.6  87.3   Hematocrit 39.0 - 52.0 % 41.7  40.7  39.1   Platelets 150 - 400 K/uL 221  179  198    .    Latest Ref Rng & Units 03/07/2023    1:25 PM 12/20/2022    1:43 PM 10/28/2022   12:29 PM  CMP   Glucose 70 - 99 mg/dL 864  817  871   BUN 8 - 23 mg/dL 11  13  17    Creatinine 0.61 - 1.24 mg/dL 9.00  9.21  9.05   Sodium 135 - 145 mmol/L 140  139  140   Potassium 3.5 - 5.1 mmol/L 3.9  3.6  3.5   Chloride 98 - 111 mmol/L 104  107  104   CO2 22 - 32 mmol/L 31  25  26    Calcium  8.9 - 10.3 mg/dL 9.4  9.3  9.4   Total Protein 6.5 - 8.1 g/dL 7.1  6.8    Total Bilirubin 0.0 - 1.2 mg/dL 0.8  0.9    Alkaline Phos 38 - 126 U/L 132  82    AST 15 - 41 U/L 22  10    ALT 0 - 44 U/L 11  8     PSA <0.1  RADIOGRAPHIC STUDIES: I have personally reviewed the radiological images as listed and agreed with the findings in the report. No results found.  ASSESSMENT & PLAN:   1.  Castration-sensitive advanced prostate cancer with lymphadenopathy diagnosed in May 2023.    2.  Androgen deprivation: He will continue to receive Eligard  every 6 months under the care of Dr. Selma and urology. Was last given in December 2023.    3.  Bone directed therapy: He is to continue calcium  and vitamin D supplements.    4.  Bladder cancer: He continues to follow with urology regarding this issue.  He has carcinoma in situ on last cystoscopy.  5.  Hypertension: His blood pressure is elevated today and normally is during doctor's visits.  Ambulatory blood pressure monitoring per his report are low are close to normal range.  I have asked him to check his blood pressure periodically and address that with his primary care physician if he is continue to have elevated blood pressure at home.    6.  Urinary retention: Resolved and his catheter has been removed.  Continues to follow with Dr. Selma regarding this issue.   PLAN: -labs today were discussed in details PSA still<0.1 -continue Zytiga  500mg  po daily -- tolerated better with decreased fatigue and improved po intake. -patient declined CT chest to monitor/evaluation for lung nodule -Answered all of patient's questions.  -Recommend to wear compression socks to reduce  leg swelling.    -Discussed lab results from today, 12/20/2022, in detail with the patient. CBC is stable. CMP I stable PSA remains undetectable  FOLLOW-UP:  RTC with Dr Onesimo with labs in 3 months F/u with [facility] for continue Eligard  injections   .The total time spent in  the appointment was 23 minutes* .  All of the patient's questions were answered with apparent satisfaction. The patient knows to call the clinic with any problems, questions or concerns.   Emaline Saran MD MS AAHIVMS [facility] Hematology/Oncology Physician [facility]  .*Total Encounter Time as defined by the Centers for Medicare and Medicaid Services includes, in addition to the face-to-face time of a patient visit (documented in the note above) non-face-to-face time: obtaining and reviewing outside history, ordering and reviewing medications, tests or procedures, care coordination (communications with other health care professionals or caregivers) and documentation in the medical record.

## 2023-03-20 ENCOUNTER — Other Ambulatory Visit: Payer: Self-pay | Admitting: Hematology

## 2023-03-20 ENCOUNTER — Other Ambulatory Visit: Payer: Self-pay | Admitting: Family Medicine

## 2023-03-23 ENCOUNTER — Telehealth: Payer: Self-pay | Admitting: Hematology

## 2023-03-23 NOTE — Telephone Encounter (Signed)
Spoke with patient confirming upcoming appointment  

## 2023-03-28 ENCOUNTER — Other Ambulatory Visit (HOSPITAL_COMMUNITY): Payer: Self-pay

## 2023-03-28 ENCOUNTER — Other Ambulatory Visit: Payer: Self-pay | Admitting: Hematology

## 2023-03-28 ENCOUNTER — Other Ambulatory Visit: Payer: Self-pay

## 2023-03-28 DIAGNOSIS — C61 Malignant neoplasm of prostate: Secondary | ICD-10-CM

## 2023-03-28 MED ORDER — ABIRATERONE ACETATE 250 MG PO TABS
250.0000 mg | ORAL_TABLET | Freq: Every day | ORAL | 0 refills | Status: DC
  Filled 2023-03-28: qty 30, 30d supply, fill #0

## 2023-03-28 NOTE — Progress Notes (Signed)
Specialty Pharmacy Refill Coordination Note  [name] is a 89 y.o. male contacted today regarding refills of specialty medication(s) Abiraterone Acetate [name]   Patient requested Pickup at [facility] at [place] date: 04/05/23   Medication will be filled on 04/05/23. This fill date is pending response to refill request from provider. Patient is aware and if they have not received fill by intended date they must follow up with pharmacy.

## 2023-03-31 ENCOUNTER — Ambulatory Visit: Payer: Self-pay | Admitting: Family Medicine

## 2023-03-31 NOTE — Telephone Encounter (Signed)
This RN attempted to call patient's daughter [name] @ [phone] but no answer. LVM asking for return call.  Copied from CRM [id number]. Topic: Clinical - Red Word Triage >> Mar 31, 2023  2:29 PM [name] wrote: Reason for CRM: [name] with Paliative Care called regarding the patient. She saw him earlier today and he was complaining of new lower back pain. It is a constant dull pain. He will take Tylenol as needed, but it is not easing the pain and is affecting his sleep. He also has a headache that is more constant with increased pressure on the backside of his head.  He is having shortness of breath with exertion. His lungs do sound clear and there was no shortness of breath while resting. He bent over to tie his shoes and became short of breath, so there may also be some additional fluid in his stomach. He has not been taking his Lasik Rx, but will be starting back on that today.  [name] mentioned he has a 2 plus bilateral ankle edema.  [name] has already called the patient's oncology office to see if Dr. Candise Che wants to repeat a CT scan to assess his cancer, but they deferred her to [place] to deal with the patient's pain while the office speaks to Dr. Candise Che about a scan.  Please call [name] [phone]) for additional clinical information from visit or patient's daughter [name] as she is very detailed on patient care and was with patient during this visit today.

## 2023-03-31 NOTE — Telephone Encounter (Signed)
3rd attempt to contact family, no ringing, straight to voicemail, LVM for call back to discuss pt's current state. Attempted to call Jolee, palliative care nurse, back to see if info about pt's current state. LVM for call back to discuss, informed that info would be sent to doc HP message.

## 2023-03-31 NOTE — Telephone Encounter (Signed)
Daughter [name] called back. [name] is unsure why triage is being involved since they have palliative care for the pt. [name] feels "too many hands are in the pot" and wants to touch base with Palliative care nurse before triaging. [name] stated he pt is not in distress. RN advised pt to call back with concerns that would we would be glad to  help. [name] verbalized understanding.             Copied from CRM [id number]. Topic: Clinical - Red Word Triage >> Mar 31, 2023  2:29 PM Corin V wrote: Reason for CRM: [name] with Paliative Care called regarding the patient. She saw him earlier today and he was complaining of new lower back pain. It is a constant dull pain. He will take Tylenol as needed, but it is not easing the pain and is affecting his sleep. He also has a headache that is more constant with increased pressure on the backside of his head.  He is having shortness of breath with exertion. His lungs do sound clear and there was no shortness of breath while resting. He bent over to tie his shoes and became short of breath, so there may also be some additional fluid in his stomach. He has not been taking his Lasik Rx, but will be starting back on that today.  [name] mentioned he has a 2 plus bilateral ankle edema.  [name] has already called the patient's oncology office to see if Dr. Candise Che wants to repeat a CT scan to assess his cancer, but they deferred her to [place] to deal with the patient's pain while the office speaks to Dr. Candise Che about a scan.  Please call [name] [phone]) for additional clinical information from visit or patient's daughter [name] as she is very detailed on patient care and was with patient during this visit today. Reason for Disposition . Health Information question, no triage required and triager able to answer question  Answer Assessment - Initial Assessment Questions 1. REASON FOR CALL or QUESTION: "What is your reason for calling today?" or "How can I best help you?" or "What  question do you have that I can help answer?"     Daughter [name] called back. [name] is unsure why triage is being involved since they have palliative care for the pt. [name] feels "too many hands are in the pot" and wants to touch base with Palliative care nurse before triaging. [name] stated he pt is not in distress.  Protocols used: Information Only Call - No Triage-A-AH

## 2023-03-31 NOTE — Telephone Encounter (Signed)
2nd attempt to contact pt family, no ringing, straight to voicemail, LVM for call back to assess further. Placed in call back.

## 2023-04-02 NOTE — Telephone Encounter (Signed)
See other phone note

## 2023-04-02 NOTE — Telephone Encounter (Signed)
Noted. Thanks. I'll defer.  

## 2023-04-03 ENCOUNTER — Telehealth: Payer: Medicare Other | Admitting: Nurse Practitioner

## 2023-04-04 ENCOUNTER — Inpatient Hospital Stay: Payer: Medicare Other | Attending: Hematology | Admitting: Nurse Practitioner

## 2023-04-04 DIAGNOSIS — R530 Neoplastic (malignant) related fatigue: Secondary | ICD-10-CM

## 2023-04-04 DIAGNOSIS — G893 Neoplasm related pain (acute) (chronic): Secondary | ICD-10-CM

## 2023-04-04 DIAGNOSIS — Z8551 Personal history of malignant neoplasm of bladder: Secondary | ICD-10-CM | POA: Insufficient documentation

## 2023-04-04 DIAGNOSIS — R519 Headache, unspecified: Secondary | ICD-10-CM | POA: Insufficient documentation

## 2023-04-04 DIAGNOSIS — Z87891 Personal history of nicotine dependence: Secondary | ICD-10-CM | POA: Insufficient documentation

## 2023-04-04 DIAGNOSIS — Z79899 Other long term (current) drug therapy: Secondary | ICD-10-CM | POA: Insufficient documentation

## 2023-04-04 DIAGNOSIS — Z515 Encounter for palliative care: Secondary | ICD-10-CM

## 2023-04-04 DIAGNOSIS — C7951 Secondary malignant neoplasm of bone: Secondary | ICD-10-CM | POA: Insufficient documentation

## 2023-04-04 DIAGNOSIS — C772 Secondary and unspecified malignant neoplasm of intra-abdominal lymph nodes: Secondary | ICD-10-CM

## 2023-04-04 DIAGNOSIS — C61 Malignant neoplasm of prostate: Secondary | ICD-10-CM | POA: Insufficient documentation

## 2023-04-04 DIAGNOSIS — Z7901 Long term (current) use of anticoagulants: Secondary | ICD-10-CM | POA: Insufficient documentation

## 2023-04-04 DIAGNOSIS — Z7952 Long term (current) use of systemic steroids: Secondary | ICD-10-CM | POA: Insufficient documentation

## 2023-04-04 DIAGNOSIS — C775 Secondary and unspecified malignant neoplasm of intrapelvic lymph nodes: Secondary | ICD-10-CM | POA: Insufficient documentation

## 2023-04-04 MED ORDER — TRAMADOL HCL 50 MG PO TABS: 50.0000 mg | ORAL_TABLET | Freq: Four times a day (QID) | ORAL | 0 refills | Status: DC | PRN

## 2023-04-04 NOTE — Progress Notes (Signed)
 Palliative Medicine [facility]  Telephone:([phone] Fax:(336) [phone]   Name: [name] Date: 04/04/2023 MRN: [id number]  DOB: 08/24/1933  Patient Care Team: Cleatus Arlyss RAMAN, MD as PCP - General (Family Medicine) Wonda Sharper, MD as PCP - Cardiology (Cardiology) Rolan Ezra RAMAN, MD as Consulting Physician (Cardiology) Lelon Glendia ONEIDA DEVONNA as Physician Assistant (Cardiology) Vertell Pont, RN as Oncology Nurse Navigator [name], Fannie SAILOR, NP as Nurse Practitioner (Nurse Practitioner) Selma Donnice SAUNDERS, MD as Consulting Physician (Urology) [facility] (Ophthalmology)   I connected with [name] on 04/04/23 at 12:30 PM EST by video and verified that I am speaking with the correct person using two identifiers.   I discussed the limitations, risks, security and privacy concerns of performing an evaluation and management service by telemedicine and the availability of in-person appointments. I also discussed with the patient that there may be a patient responsible charge related to this service. The patient expressed understanding and agreed to proceed.   Other persons participating in the visit and their role in the encounter: [place], daughter and Significant Other present.   Patient's location: home  Provider's location: [facility]   Chief Complaint: follow up pf symptom management    INTERVAL HISTORY: BRACEN SCHUM is a 89 y.o. male with oncologic medical history including malignant neoplasm of prostate (06/2022) metastatic disease to intra-abdominal lymph node. Previous history of bladder cancer (06/2011), abdominal aortic aneurysm without rupture, CHF, A-Fib, and HLD. Palliative ask to see for symptom management and goals of care.     SOCIAL HISTORY:     reports that he quit smoking about 33 years ago. His smoking use included cigarettes. He started smoking about 63 years ago. He has a 37.5 pack-year smoking history. He  has never used smokeless tobacco. He reports that he does not drink alcohol and does not use drugs.  ADVANCE DIRECTIVES:  None on file  CODE STATUS: Full code  PAST MEDICAL HISTORY: Past Medical History:  Diagnosis Date   AAA (abdominal aortic aneurysm) (HCC)    s/p stent graft repair 2012   Arthritis    right ankle (04/17/2014)   Ascending aorta dilatation (HCC)    Echo 2021: 42 mm // Echocardiogram 10/22: EF 55-60, no RWMA, mildly reduced RVSF, RVSP 39.7, severe BAE, trivial MR, mod TR, trivial AI, mild AS (mean 8), ascending aorta 40 mm   Atrial fibrillation (HCC)    a. permanent;  b. Hematuria on Pradaxa ;  c. cold intol and chills with Xarelto    Bladder cancer (HCC)    Cardiomyopathy (HCC)    a. Echo (03/2013): Inferior and inferolateral AK, EF 40-45%, mild MR, mod LAE, mod RAE, mod TR, mild PI, PASP 46   Chronic kidney disease    COPD (chronic obstructive pulmonary disease) (HCC)    Coronary artery disease s/p cabg x7  1992   a. s/p CABG in 1992 (X 7 - Dr. Tanda);  b.  Myoview  (01/2011): Fixed inferior defect suggestive of prior MI versus diaphragmatic attenuation, apical reversibility possibly suspicious for small area of infarction with peri-infarct ischemia, no significant change from prior study, not gated, low risk study;  c.  Echo (05/2010): Mod LVH, EF 55-65%, mild AI, mild MR, moderate LAE, mild RAE, moderate    Hematuria bladder tumor ---  followed by Dr Ottelin   Hiatal hernia    repaired in 1992 w/[facility]   Hx of cardiovascular stress test    Lexiscan  Myoview  (03/2013):  No ischemia; not gated.   Hyperglycemia diet controlled   Hyperlipidemia    Hypertension    Hypothyroidism    Obesity    Organic impotence    Prostate cancer (HCC)    RBBB    S/P AAA repair using bifurcation graft    Sleep apnea no rx cpap   mild- tested  2012   Ventricular ectopy    mild    ALLERGIES:  is allergic to dabigatran  etexilate mesylate, xarelto  [rivaroxaban ], and  metoprolol .  MEDICATIONS:  Current Outpatient Medications  Medication Sig Dispense Refill   abiraterone  acetate (ZYTIGA ) 250 MG tablet Take 1 tablet (250 mg total) by mouth daily. Take on an empty stomach 1 hour before or 2 hours after a meal 30 tablet 0   atorvastatin  (LIPITOR) 40 MG tablet TAKE ONE TABLET BY MOUTH ONCE A DAY 90 tablet 0   docusate sodium  (COLACE) 100 MG capsule Take 1 capsule (100 mg total) by mouth daily as needed for up to 30 doses. 30 capsule 0   ELIQUIS  5 MG TABS tablet TAKE ONE TABLET BY MOUTH TWICE (2) DAILY 60 tablet 5   finasteride (PROSCAR) 5 MG tablet Take 5 mg by mouth daily.     furosemide  (LASIX ) 20 MG tablet TAKE ONE TABLET (20 MG TOTAL) BY MOUTH TWO TIMES DAILY. 60 tablet 0   levothyroxine  (SYNTHROID ) 137 MCG tablet TAKE ONE TABLET BY MOUTH ONCE A DAY ON AN EMPTY STOMACH 90 tablet 0   predniSONE  (DELTASONE ) 5 MG tablet TAKE ONE TABLET (5 MG TOTAL) BY MOUTH DAILY WITH BREAKFAST. 90 tablet 1   ramipril  (ALTACE ) 5 MG capsule TAKE ONE CAPSULE BY MOUTH TWO TIMES DAILY 180 capsule 1   tamsulosin (FLOMAX) 0.4 MG CAPS capsule Take 0.4 mg by mouth at bedtime.     No current facility-administered medications for this visit.    VITAL SIGNS: There were no vitals taken for this visit. There were no vitals filed for this visit.  Estimated body mass index is 36.78 kg/m as calculated from the following:   Height as of 01/03/23: 5' 11 (1.803 m).   Weight as of 03/07/23: 263 lb 11.2 oz (119.6 kg).   PERFORMANCE STATUS (ECOG) : 0 - Asymptomatic  IMPRESSION:  I connected by video with [name] for follow-up on symptoms. HIs family is present. He reports chronic urinary issues but no new changes. Poor sleep pattern, attributing it to having his days and nights mixed up, although he slept well the two nights prior to the visit. He mentions a decrease in appetite but has been eating well recently. He notes a weight loss from 260 pounds to 255 pounds, which he  attributes to resuming a diuretic medication after a brief discontinuation.   [name] is followed by outpatient palliative with [facility].   The patient is complaining of new onset of symptoms over the past 2-3 weeks that includes blurry vision when looking to the right. Previously encouraged further ED work-up however patient and family declined. He reports his vision remains clear when looking straight ahead or when closing each eye individually or looking with both eyes. Peripheral vision on the right side is also blurry, though he can still detect movement. The blurriness does not improve with the use of glasses.  He has persistent headache primarily located at the back of his head which radiates at times around to forehead, which was less severe on the morning of the visit. There are no changes in vision or  the appearance of black specks during headaches. No numbness, tingling, or difficulty chewing on the right side of his face, despite some drooping of the eye on that side. He has not experienced nausea or vomiting.  [name] previously declined imaging per Dr. Onesimo however would like to proceed for further evaluation to include head and chest if possible. This has been relayed to the appropriate oncology team.   We discussed at length patients worsening pain. He is complaining of persistent lower back pain and leg pain from the hip to the ankle, which is alleviated by standing. The pain is not severe but requires constant movement or standing to manage. His back pain is constant. Family has been administering Tylenol  with minimal relief. This is affecting his everyday activities and quality of life. Education provided on use of Tramadol  for his pain. Discussed use, efficacy, and potential side effects.   All questions answered and support provided.  Assessment and Plan  Visual Changes Blurry vision when looking to the right, both with and without glasses. No changes in peripheral  vision. No associated symptoms such as black specks or increased blurriness with headaches. Plan for scans of the chest and head to investigate further. -Pending scans per Oncology  Headaches Constant headaches, particularly at the back of the head. No associated changes in vision. Pain seems to be slightly improved recently. -Prescribe Tramadol  1 tablet every 6 hours as needed for pain. Can increase to 2 tablets if pain is severe. -Patient declined ED evaluation for neurological work-up.   Lower Back Pain Chronic lower back pain. Currently managed with Tylenol  with minimal relief. -Prescribe Tramadol  50-100mg  every 6 hours as needed for pain management.  Sleep Disturbances Poor sleep quality, possibly due to reversed sleep-wake cycle. -Advise on sleep hygiene and maintaining a regular sleep schedule.  Leg Pain Pain from hip to ankle on one side, requiring constant movement or standing for relief. -Use prescribed Tramadol  for pain management.  General Health Maintenance -Continue current medications. -Check for constipation as a potential side effect of Tramadol . Use stool softener as needed. -Follow-up call scheduled for next Tuesday at 3:30pm to assess response to Tramadol  and discuss scan results.  Patient expressed understanding and was in agreement with this plan. He also understands that He can call the clinic at any time with any questions, concerns, or complaints.   Any controlled substances utilized were prescribed in the context of palliative care. PDMP has been reviewed.   Visit consisted of counseling and education dealing with the complex and emotionally intense issues of symptom management and palliative care in the setting of serious and potentially life-threatening illness.  Levon Borer, AGPCNP-BC  Palliative Medicine Team/[facility]

## 2023-04-05 ENCOUNTER — Telehealth: Payer: Medicare Other

## 2023-04-10 NOTE — Progress Notes (Signed)
Palliative Medicine [facility]  Telephone:(336) [phone] Fax:(336) [phone]   Name: [name] Date: 04/10/2023 MRN: [id number]  DOB: 05-31-1933  Patient Care Team: Joaquim Nam, MD as PCP - General (Family Medicine) Tonny Bollman, MD as PCP - Cardiology (Cardiology) Laurey Morale, MD as Consulting Physician (Cardiology) Kennon Rounds as Physician Assistant (Cardiology) Cherlyn Cushing, RN as Oncology Nurse Navigator [name], [name], NP as Nurse Practitioner (Nurse Practitioner) [name], MD as Consulting Physician (Urology) [facility] (Ophthalmology)   I connected with [name] on 04/10/23 at 11:30 AM EST by phone and verified that I am speaking with the correct person using two identifiers.   I discussed the limitations, risks, security and privacy concerns of performing an evaluation and management service by telemedicine and the availability of in-person appointments. I also discussed with the patient that there may be a patient responsible charge related to this service. The patient expressed understanding and agreed to proceed.   Other persons participating in the visit and their role in the encounter: [name], daughter and Significant Other present.   Patient's location: home  Provider's location: [facility]   Chief Complaint: follow up pf symptom management    INTERVAL HISTORY: [name] is a 89 y.o. male with oncologic medical history including malignant neoplasm of prostate (06/2022) metastatic disease to intra-abdominal lymph node. Previous history of bladder cancer (06/2011), abdominal aortic aneurysm without rupture, CHF, A-Fib, and HLD. Palliative ask to see for symptom management and goals of care.     SOCIAL HISTORY:     reports that he quit smoking about 33 years ago. His smoking use included cigarettes. He started smoking about 63 years ago. He has a 37.5 pack-year smoking history. He  has never used smokeless tobacco. He reports that he does not drink alcohol and does not use drugs.  ADVANCE DIRECTIVES:  None on file  CODE STATUS: Full code  PAST MEDICAL HISTORY: Past Medical History:  Diagnosis Date   AAA (abdominal aortic aneurysm) (HCC)    s/p stent graft repair 2012   Arthritis    "right ankle" (04/17/2014)   Ascending aorta dilatation (HCC)    Echo 2021: 42 mm // Echocardiogram 10/22: EF 55-60, no RWMA, mildly reduced RVSF, RVSP 39.7, severe BAE, trivial MR, mod TR, trivial AI, mild AS (mean 8), ascending aorta 40 mm   Atrial fibrillation (HCC)    a. permanent;  b. Hematuria on Pradaxa;  c. cold intol and chills with Xarelto   Bladder cancer (HCC)    Cardiomyopathy (HCC)    a. Echo (03/2013): Inferior and inferolateral AK, EF 40-45%, mild MR, mod LAE, mod RAE, mod TR, mild PI, PASP 46   Chronic kidney disease    COPD (chronic obstructive pulmonary disease) (HCC)    Coronary artery disease s/p cabg x7  1992   a. s/p CABG in 1992 (X 7 - Dr. Andrey Campanile);  b.  Myoview (01/2011): Fixed inferior defect suggestive of prior MI versus diaphragmatic attenuation, apical reversibility possibly suspicious for small area of infarction with peri-infarct ischemia, no significant change from prior study, not gated, low risk study;  c.  Echo (05/2010): Mod LVH, EF 55-65%, mild AI, mild MR, moderate LAE, mild RAE, moderate    Hematuria bladder tumor ---  followed by Dr Vernie Ammons   Hiatal hernia    repaired in 1992 w/[facility]   Hx of cardiovascular stress test    Lexiscan Myoview (03/2013):  No ischemia; not gated.   Hyperglycemia diet controlled   Hyperlipidemia    Hypertension    Hypothyroidism    Obesity    Organic impotence    Prostate cancer (HCC)    RBBB    S/P AAA repair using bifurcation graft    Sleep apnea no rx cpap   mild- tested  2012   Ventricular ectopy    mild    ALLERGIES:  is allergic to dabigatran etexilate mesylate, xarelto [rivaroxaban], and  metoprolol.  MEDICATIONS:  Current Outpatient Medications  Medication Sig Dispense Refill   abiraterone acetate (ZYTIGA) 250 MG tablet Take 1 tablet (250 mg total) by mouth daily. Take on an empty stomach 1 hour before or 2 hours after a meal 30 tablet 0   atorvastatin (LIPITOR) 40 MG tablet TAKE ONE TABLET BY MOUTH ONCE A DAY 90 tablet 0   docusate sodium (COLACE) 100 MG capsule Take 1 capsule (100 mg total) by mouth daily as needed for up to 30 doses. 30 capsule 0   ELIQUIS 5 MG TABS tablet TAKE ONE TABLET BY MOUTH TWICE (2) DAILY 60 tablet 5   finasteride (PROSCAR) 5 MG tablet Take 5 mg by mouth daily.     furosemide (LASIX) 20 MG tablet TAKE ONE TABLET (20 MG TOTAL) BY MOUTH TWO TIMES DAILY. 60 tablet 0   levothyroxine (SYNTHROID) 137 MCG tablet TAKE ONE TABLET BY MOUTH ONCE A DAY ON AN EMPTY STOMACH 90 tablet 0   predniSONE (DELTASONE) 5 MG tablet TAKE ONE TABLET (5 MG TOTAL) BY MOUTH DAILY WITH BREAKFAST. 90 tablet 1   ramipril (ALTACE) 5 MG capsule TAKE ONE CAPSULE BY MOUTH TWO TIMES DAILY 180 capsule 1   tamsulosin (FLOMAX) 0.4 MG CAPS capsule Take 0.4 mg by mouth at bedtime.     traMADol (ULTRAM) 50 MG tablet Take 1 tablet (50 mg total) by mouth every 6 (six) hours as needed. 45 tablet 0   No current facility-administered medications for this visit.    VITAL SIGNS: There were no vitals taken for this visit. There were no vitals filed for this visit.  Estimated body mass index is 36.78 kg/m as calculated from the following:   Height as of 01/03/23: 5\' 11"  (1.803 m).   Weight as of 03/07/23: 263 lb 11.2 oz (119.6 kg).   PERFORMANCE STATUS (ECOG) : 0 - Asymptomatic  IMPRESSION:  I connected with [name] via phone for symptom management follow-up. He has a history of prostate and bladder cancer. Is complaining of worsening back pain and fatigue. He is accompanied by his son, who is concerned about his father's quality of life. He experiences fatigue and a general sense  of being 'tired and give out' consistently. He is currently taking prednisone 5mg , one tablet daily as prescribed. He mentions that his fatigue has been a long-standing issue.  He has a history of prostate cancer and is concerned about the potential spread of cancer, particularly given his new back pain and persistent fatigue. His son emotionally expresses his concern about the possibility of cancer affecting his sudden onset of back pain and or brain, given his symptoms. They are requesting scans to evaluate with understanding recent tumor markers were undetectable. [name] reports occasional headaches, though not daily most recently, and notes that his right eye droops, which he feels has worsened. He experiences blurry vision, particularly when looking to the right, which makes him apprehensive about driving. Patient was previously advised to have ED evaluation upon  symptom onset however declined at that time. He has not had recent PCP follow-up with Dr. Para March. Request to also reach out to their office given recent changes.   We discussed his pain at length. Mr. Cinquemani reports that he is experiencing worsening back pain that has become severe enough to impair his ability to walk upon getting out of bed. Tylenol provides more relief than his prescribed pain medication, tramadol, which he takes twice daily, once in the morning and once at night. Despite this, he still experiences significant pain, particularly when getting up from bed, changing positions, or standing/walking for long periods. He also uses a heating pad while sitting in his recliner, which provides some relief. The pain has recently become more pronounced, requiring him to sit down while waiting in line at store several days ago due to the severity of the pain. He uses a walking cane for support when necessary.  The patient has Tramadol 50mg  on hand. Discussed optimizing use given his pain has worsened. Education provided on use in  addition to Tylenol. He is aware he may take 1-2 tablets as needed every 4-6 hours. Discussed would not want to change him to anything stronger until seeing if he can gain better relief taking Tramadol more frequently at increased doses. He and son verbalized understanding.   Goals of Care [name] and his son reports his quality of life is most valuable over quantity. He is actively followed by [facility]. He is emotional expressing decrease in quality of life over the past several weeks making him question if continuing with aggressive measures such as oral treatments and medications. They are realistic in their understanding expressing awareness patient's health is a challenge and at some point, he will face end-of-life however wishes to make sure that what time he has left remains as good as it can for as long as it can. Support provided.    We will continue to closely monitor and support in collaboration with oncology team.  Assessment and Plan  Back Pain Severe, limiting mobility. Noted some improvement with Tylenol, but persistent pain despite Tramadol use. No clear etiology identified in the conversation. No recent scans or work-up by PCP.  -Increase Tramadol to 2 tablets (50-100mg ) as needed for pain. -Continue Tylenol as needed. -Consider alternative pain management if no improvement with increased Tramadol dose.  Headache Intermittent, no clear pattern identified. Improvement noted with Tylenol and Tramadol. -Continue Tylenol and Tramadol as needed.  Cancer  On oral Zytiga which he associates with fatigue. Patient's family questioning benefit of medications and ongoing therapies due to impact on quality of life. -Continue current cancer medication as prescribed.  -Discussed with oncologist (Dr. Candise Che and Waynetta Sandy, RN) regarding potential for imaging to assess disease changes and potential medication adjustments (prednisone). Plans to schedule follow-up for further work-up  as needed.   Fatigue Chronic, potentially related to cancer medication. -Continue current management, discuss with oncologist for potential adjustments.  Ptosis (drooping right eye) Progressive, affecting vision and limiting activities such as driving. -Report to PCP (Dr. Para March) for further evaluation.  Steroid use (Prednisone) On 5mg  daily, discussion of potential increase for energy. -Continue Prednisone 5mg  daily. -Discuss with Dr. Demetrio Lapping regarding potential adjustments.  Follow-up Plan to touch base on Friday to assess pain management efficacy. Further discussion with Dr. Demetrio Lapping for support and navigating patient's reported symptoms. Also advised patient to reach out to Dr. Para March. Patient expressed understanding and was in agreement with this plan. He also understands that He  can call the clinic at any time with any questions, concerns, or complaints.   Any controlled substances utilized were prescribed in the context of palliative care. PDMP has been reviewed.   Visit consisted of counseling and education dealing with the complex and emotionally intense issues of symptom management and palliative care in the setting of serious and potentially life-threatening illness.  Willette Alma, AGPCNP-BC  Palliative Medicine Team/[facility]

## 2023-04-11 ENCOUNTER — Encounter: Payer: Self-pay | Admitting: Nurse Practitioner

## 2023-04-11 ENCOUNTER — Inpatient Hospital Stay (HOSPITAL_BASED_OUTPATIENT_CLINIC_OR_DEPARTMENT_OTHER): Payer: Medicare Other | Admitting: Nurse Practitioner

## 2023-04-11 DIAGNOSIS — C61 Malignant neoplasm of prostate: Secondary | ICD-10-CM | POA: Diagnosis not present

## 2023-04-11 DIAGNOSIS — R530 Neoplastic (malignant) related fatigue: Secondary | ICD-10-CM | POA: Diagnosis not present

## 2023-04-11 DIAGNOSIS — Z7189 Other specified counseling: Secondary | ICD-10-CM

## 2023-04-11 DIAGNOSIS — G893 Neoplasm related pain (acute) (chronic): Secondary | ICD-10-CM

## 2023-04-11 DIAGNOSIS — Z515 Encounter for palliative care: Secondary | ICD-10-CM

## 2023-04-11 DIAGNOSIS — C772 Secondary and unspecified malignant neoplasm of intra-abdominal lymph nodes: Secondary | ICD-10-CM

## 2023-04-12 ENCOUNTER — Other Ambulatory Visit: Payer: Self-pay

## 2023-04-12 DIAGNOSIS — C772 Secondary and unspecified malignant neoplasm of intra-abdominal lymph nodes: Secondary | ICD-10-CM

## 2023-04-12 NOTE — Progress Notes (Signed)
 HEMATOLOGY/ONCOLOGY CLINIC NOTE  Date of Service:  04/13/2023   Patient Care Team: Joaquim Nam, MD as PCP - General (Family Medicine) Tonny Bollman, MD as PCP - Cardiology (Cardiology) Laurey Morale, MD as Consulting Physician (Cardiology) Kennon Rounds as Physician Assistant (Cardiology) Cherlyn Cushing, RN as Oncology Nurse Navigator [name], [name], NP as Nurse Practitioner (Nurse Practitioner) [name], MD as Consulting Physician (Urology) [facility] (Ophthalmology)  CHIEF COMPLAINTS/PURPOSE OF CONSULTATION:   Malignant neoplasm of prostate metastatic to intra-abdominal lymph node [facility])   Prior Therapy: CT-guided biopsy of the left pelvic lymph nodes on June 02, 2021 showed metastatic carcinoma staining positive for PSA.   Current therapy:   Eligard 45 mg every 6 months started in June 2023.  He is currently receiving that under the care of [facility].   Zytiga 1000 mg with prednisone 5 mg started on August 03, 2021.  His dose was reduced to 500 mg daily dose starting in September 2023.and dpwn to 500mg  po daily from 12/20/2022  HISTORY OF PRESENTING ILLNESS:   [name] is a wonderful 89 y.o. male who is here for continued evaluation and management of Malignant neoplasm of prostate metastatic to intra-abdominal lymph node (HCC). Patient was getting followed up by [name]. Patient is accompanied by his daughter during this visit.   Patient was initially diagnosed with advanced prostate cancer in May 2023. He also had castration-sensitive with lymphadenopathy and bone disease, PSA with 24 at the time of diagnosis. He was also previously diagnosed with superficial bladder tumor in 2012 and developed relapsed disease in May 2023 with CIS.  Patient's current treatment includes Eligard 45 mg every 6 months which was started in June 2023 and Zytiga 500 mg with Prednisone 5 mg. He previously on Zytiga 1000 mg from August 03, 2021 to September 2023. Reduced to 500 mg in September 2023.   Patient was last seen by [name] on 01/18/2022 and he was doing well overall. He denied of any toxicities with Zytiga after reducing the dosage. During the last visit, Foley catheter was removed.   He reports he has been doing well overall since his last visit with [name]. He denies fever, chills, night sweats, new lumps/bumps, urinary problems, abnormal bowel movement, unexpected weight loss, bone pain, abdominal pain, chest pain, back pain. However, he does complain of increased fatigue, occasional bilateral leg swelling, occasional shortness of breath, and occasional constipation. His fatigue is affecting his daily activities, such as getting tired making breakfast. He notes that his fatigue causes bilateral leg weakness.  Patient's daughter notes that the patient recently had sinus infection since the last visit. He has recovered well from the infection. His daughter also reports that the patient has decreased appetite, but his weight has been stable.   He has dentures and no other dental issues.   He is complaint with all of his medications.   Patient's daughter notes that he has a blister on his right feet sole, which is affecting his walking and movement.   INTERVAL HISTORY:  [name] is a wonderful 89 y.o. male who is here for continued evaluation and management of Malignant neoplasm of prostate metastatic to intra-abdominal lymph node (HCC).   Patient was last seen by me on 03/07/2023 and was doing well overall with no new medical complaints.   Today, he is accompanied by his son and daughter. Patient presents in a wheelchair and has a cane  available. Patient complains of plenty of bilateral back pain, neck pain, and headache.   His headache/cervicalgia is present in back of the head and upper neck area. He reports that his headache has worsened in the last 3-4 weeks. Patient notes that his headache was  mildly present intermittently previously.   He denies any neck on palpation and has no tenderness when pushing on the back.   Patient reports that he generally takes Tylenol in the mornings and takes narcotic mediation at night. Pain medication does improve his head and back pain. His son reports an incident in which after patient takes his narcotic medication, he did sleep for longer/harder.   He reports vision issues present over the last month, in which he experiences double vision when looking to the right. Patient has normal focus when looking to the left side. He has no change in vision when looking straight ahead. Patient was noted to endorse mild swelling in the right eyelid. He reports that he does not drive at this time due to his vision issues. Patient has not yet been evaluated by an ophthalmologist.    Patient notes that he did stop Lasix for 1 week which caused SOB and leg swelling. He has since restarted Lasix, and his leg swelling improved. He endorses mild leg swelling at this time.   Patient reports that when he does not take Flomax, it is difficult to initiate urination. His symptoms improve when taking the medication.   He takes one 5 MG tablet of Prednisone regularly.   Patient reports that he currently has a poor quality of life. He daughter reports that he feels miserable and hopeless often. Patient's son reports that patient has extreme fatigue and weakness.   Patient decreased appetite over the last week. His daughter reports that patient has had a change in his taste buds recently.   Passing denies any discomfort with passing urine, change in urine color/odor, new cough, or cold.   His daughter reports that patient has endorsed confusion issues, such as "randomly taking medication".   He reports depression, which he attributes to his medication.   He notes that it has been a while since he has followed up with his PCP.   MEDICAL HISTORY:  Past Medical History:   Diagnosis Date   AAA (abdominal aortic aneurysm) (HCC)    s/p stent graft repair 2012   Arthritis    "right ankle" (04/17/2014)   Ascending aorta dilatation (HCC)    Echo 2021: 42 mm // Echocardiogram 10/22: EF 55-60, no RWMA, mildly reduced RVSF, RVSP 39.7, severe BAE, trivial MR, mod TR, trivial AI, mild AS (mean 8), ascending aorta 40 mm   Atrial fibrillation (HCC)    a. permanent;  b. Hematuria on Pradaxa;  c. cold intol and chills with Xarelto   Bladder cancer (HCC)    Cardiomyopathy (HCC)    a. Echo (03/2013): Inferior and inferolateral AK, EF 40-45%, mild MR, mod LAE, mod RAE, mod TR, mild PI, PASP 46   Chronic kidney disease    COPD (chronic obstructive pulmonary disease) (HCC)    Coronary artery disease s/p cabg x7  1992   a. s/p CABG in 1992 (X 7 - Dr. Andrey Campanile);  b.  Myoview (01/2011): Fixed inferior defect suggestive of prior MI versus diaphragmatic attenuation, apical reversibility possibly suspicious for small area of infarction with peri-infarct ischemia, no significant change from prior study, not gated, low risk study;  c.  Echo (05/2010): Mod LVH, EF 55-65%, mild AI,  mild MR, moderate LAE, mild RAE, moderate    Hematuria bladder tumor ---  followed by Dr Vernie Ammons   Hiatal hernia    repaired in 1992 w/[facility]   Hx of cardiovascular stress test    Lexiscan Myoview (03/2013):  No ischemia; not gated.   Hyperglycemia diet controlled   Hyperlipidemia    Hypertension    Hypothyroidism    Obesity    Organic impotence    Prostate cancer (HCC)    RBBB    S/P AAA repair using bifurcation graft    Sleep apnea no rx cpap   mild- tested  2012   Ventricular ectopy    mild    SURGICAL HISTORY: Past Surgical History:  Procedure Laterality Date   ABDOMINAL AORTIC ANEURYSM REPAIR  02/28/2010   using bifurcation graft   CARDIAC CATHETERIZATION  03/31/1990   Positive   CARDIOVASCULAR STRESS TEST  02-01-2011  NUCLEAR NO EXERCISE   LOW RISK STUDY.  SMALL APICAL DEFECT. NO EVIDENCE OF  MULTIZONE ISCHEMIA.   CORONARY ARTERY BYPASS GRAFT  05/30/1990   CABG X 7   CYSTOSCOPY N/A 01/07/2022   Procedure: CYSTOSCOPY, BLADDER BIOPSY WITH FULGERATION BILATERAL RETROGRADE PYELOGRAM;  Surgeon: [name], MD;  Location: [facility];  Service: Urology;  Laterality: N/A;  45 MINUTES NEEDED FOR CASE  INTUBATED PARALYZED   CYSTOSCOPY WITH BIOPSY N/A 07/23/2021   Procedure: CYSTOSCOPY WITH  BLADDER BIOPSY AND BILATERAL RETROGRADE PYELOGRAM;  Surgeon: [name], MD;  Location: [facility];  Service: Urology;  Laterality: N/A;   ENDOVASCULAR STENT INSERTION  02/15/2011   Procedure: ENDOVASCULAR STENT GRAFT INSERTION;  Surgeon: Nilda Simmer, MD;  Location: [facility] OR;  Service: Vascular;  Laterality: N/A;  Insertion of endovascular stent graft    ETT  10/31/2000   [facility]   HIATAL HERNIA REPAIR  02/28/1990   "repaired when I had my OHS"   Hospital - Afib  03/28/2002   INGUINAL HERNIA REPAIR Left 03/01/1995   TOTAL ANKLE ARTHROPLASTY Right 04/17/2014   Procedure: RIGHT TOTAL ANKLE ARTHOPLASTY;  Surgeon: Toni Arthurs, MD;  Location: [facility] OR;  Service: Orthopedics;  Laterality: Right;   TRANSURETHRAL RESECTION OF BLADDER TUMOR  03/25/2011   Procedure: TRANSURETHRAL RESECTION OF BLADDER TUMOR (TURBT);  Surgeon: Garnett Farm, MD;  Location: [facility];  Service: Urology;  Laterality: N/A;    SOCIAL HISTORY: Social History   Socioeconomic History   Marital status: Widowed    Spouse name: Not on file   Number of children: Not on file   Years of education: Not on file   Highest education level: Not on file  Occupational History   Occupation: retired Water quality scientist.    Employer: retired    Comment: 2001. Likes to play golf . Travels with motor home  Tobacco Use   Smoking status: Former    Current packs/day: 0.00    Average packs/day: 1.3 packs/day for 30.0 years (37.5 ttl pk-yrs)    Types: Cigarettes    Start date: 02/29/1960    Quit date: 02/28/1990    Years since  quitting: 33.1   Smokeless tobacco: Never  Vaping Use   Vaping status: Never Used  Substance and Sexual Activity   Alcohol use: No    Alcohol/week: 0.0 standard drinks of alcohol    Comment: basically no alcohol use.  only very rare alcohol use.    Drug use: No   Sexual activity: Yes  Other Topics Concern   Not on file  Social  History Narrative   From [place]   Lives with girlfriend.     Divorced, remarried then widowed, 2 children alive- (prev with 3 children- from 1st marriage)   Likes to golf   Prev 4 sport athlete in HS, football fan.    Social Drivers of Corporate investment banker Strain: Low Risk  (01/03/2023)   Overall Financial Resource Strain (CARDIA)    Difficulty of Paying Living Expenses: Not hard at all  Food Insecurity: No Food Insecurity (01/03/2023)   Hunger Vital Sign    Worried About Running Out of Food in the Last Year: Never true    Ran Out of Food in the Last Year: Never true  Transportation Needs: No Transportation Needs (01/03/2023)   PRAPARE - Administrator, Civil Service (Medical): No    Lack of Transportation (Non-Medical): No  Physical Activity: Inactive (01/03/2023)   Exercise Vital Sign    Days of Exercise per Week: 0 days    Minutes of Exercise per Session: 0 min  Stress: No Stress Concern Present (01/03/2023)   [employer] of Occupational Health - Occupational Stress Questionnaire    Feeling of Stress : Not at all  Social Connections: Socially Isolated (01/03/2023)   Social Connection and Isolation Panel [NHANES]    Frequency of Communication with Friends and Family: Three times a week    Frequency of Social Gatherings with Friends and Family: Twice a week    Attends Religious Services: Never    Database administrator or Organizations: No    Attends Banker Meetings: Never    Marital Status: Widowed  Intimate Partner Violence: Not At Risk (01/03/2023)   Humiliation, Afraid, Rape, and Kick questionnaire     Fear of Current or Ex-Partner: No    Emotionally Abused: No    Physically Abused: No    Sexually Abused: No    FAMILY HISTORY: Family History  Problem Relation Age of Onset   Obesity Mother 57       deceased   Hypertension Mother    Diabetes Mother    Heart disease Mother        before age 80   Hyperlipidemia Mother    Alcohol abuse Sister 57       deceased   Other Father        deceased unknown   Colon cancer Neg Hx    Prostate cancer Neg Hx    Anesthesia problems Neg Hx     ALLERGIES:  is allergic to dabigatran etexilate mesylate, xarelto [rivaroxaban], and metoprolol.  MEDICATIONS:  Current Outpatient Medications  Medication Sig Dispense Refill   abiraterone acetate (ZYTIGA) 250 MG tablet Take 1 tablet (250 mg total) by mouth daily. Take on an empty stomach 1 hour before or 2 hours after a meal 30 tablet 0   atorvastatin (LIPITOR) 40 MG tablet TAKE ONE TABLET BY MOUTH ONCE A DAY 90 tablet 0   docusate sodium (COLACE) 100 MG capsule Take 1 capsule (100 mg total) by mouth daily as needed for up to 30 doses. 30 capsule 0   ELIQUIS 5 MG TABS tablet TAKE ONE TABLET BY MOUTH TWICE (2) DAILY 60 tablet 5   finasteride (PROSCAR) 5 MG tablet Take 5 mg by mouth daily.     furosemide (LASIX) 20 MG tablet TAKE ONE TABLET (20 MG TOTAL) BY MOUTH TWO TIMES DAILY. 60 tablet 0   levothyroxine (SYNTHROID) 137 MCG tablet TAKE ONE TABLET BY MOUTH ONCE A DAY ON  AN EMPTY STOMACH 90 tablet 0   predniSONE (DELTASONE) 5 MG tablet TAKE ONE TABLET (5 MG TOTAL) BY MOUTH DAILY WITH BREAKFAST. 90 tablet 1   ramipril (ALTACE) 5 MG capsule TAKE ONE CAPSULE BY MOUTH TWO TIMES DAILY 180 capsule 1   tamsulosin (FLOMAX) 0.4 MG CAPS capsule Take 0.4 mg by mouth at bedtime.     traMADol (ULTRAM) 50 MG tablet Take 1 tablet (50 mg total) by mouth every 6 (six) hours as needed. 45 tablet 0   No current facility-administered medications for this visit.    REVIEW OF SYSTEMS:    10 Point review of Systems was  done is negative except as noted above.   PHYSICAL EXAMINATION: ECOG PERFORMANCE STATUS: 2 - Symptomatic, <50% confined to bed  . Vitals:   04/13/23 1202  BP: (!) 170/69  Pulse: (!) 45  Resp: 20  Temp: 97.6 F (36.4 C)  SpO2: 95%   Filed Weights   04/13/23 1202  Weight: 261 lb 4.8 oz (118.5 kg)   .Body mass index is 36.44 kg/m.   GENERAL:alert, in no acute distress and comfortable SKIN: no acute rashes, no significant lesions EYES: conjunctiva are pink and non-injected, sclera anicteric OROPHARYNX: MMM, no exudates, no oropharyngeal erythema or ulceration NECK: supple, no JVD LYMPH:  no palpable lymphadenopathy in the cervical, axillary or inguinal regions LUNGS: clear to auscultation b/l with normal respiratory effort HEART: regular rate & rhythm ABDOMEN:  normoactive bowel sounds , non tender, not distended. Extremity: no pedal edema PSYCH: alert & oriented x 3 with fluent speech NEURO: no focal motor/sensory deficits    LABORATORY DATA:  I have reviewed the data as listed .    Latest Ref Rng & Units 04/13/2023   11:44 AM 03/07/2023    1:25 PM 12/20/2022    1:43 PM  CBC  WBC 4.0 - 10.5 K/uL 8.1  7.5  6.2   Hemoglobin 13.0 - 17.0 g/dL 16.1  09.6  04.5   Hematocrit 39.0 - 52.0 % 42.2  41.7  40.7   Platelets 150 - 400 K/uL 172  221  179    .    Latest Ref Rng & Units 04/13/2023   11:44 AM 03/07/2023    1:25 PM 12/20/2022    1:43 PM  CMP  Glucose 70 - 99 mg/dL 409  811  914   BUN 8 - 23 mg/dL 15  11  13    Creatinine 0.61 - 1.24 mg/dL 7.82  9.56  2.13   Sodium 135 - 145 mmol/L 137  140  139   Potassium 3.5 - 5.1 mmol/L 3.9  3.9  3.6   Chloride 98 - 111 mmol/L 101  104  107   CO2 22 - 32 mmol/L 29  31  25    Calcium 8.9 - 10.3 mg/dL 9.5  9.4  9.3   Total Protein 6.5 - 8.1 g/dL 7.1  7.1  6.8   Total Bilirubin 0.0 - 1.2 mg/dL 1.0  0.8  0.9   Alkaline Phos 38 - 126 U/L 198  132  82   AST 15 - 41 U/L 29  22  10    ALT 0 - 44 U/L 12  11  8     PSA  <0.1  RADIOGRAPHIC STUDIES: I have personally reviewed the radiological images as listed and agreed with the findings in the report. No results found.  ASSESSMENT & PLAN:   89 y.o. male with:  1.  Castration-sensitive advanced prostate cancer with lymphadenopathy diagnosed  in May 2023.    2.  Androgen deprivation: He will continue to receive Eligard every 6 months under the care of Dr. Cardell Peach and urology. Was last given in December 2023.    3.  Bone directed therapy: He is to continue calcium and vitamin D supplements.    4.  Bladder cancer: He continues to follow with urology regarding this issue.  He has carcinoma in situ on last cystoscopy.  5.  Hypertension: His blood pressure is elevated today and normally is during doctor's visits.  Ambulatory blood pressure monitoring per his report are low are close to normal range.  I have asked him to check his blood pressure periodically and address that with his primary care physician if he is continue to have elevated blood pressure at home.    6.  Urinary retention: Resolved and his catheter has been removed.  Continues to follow with Dr. Cardell Peach regarding this issue.   PLAN:  -Discussed lab results on 04/13/2023 in detail with patient. CBC showed WBC of 8.1K, hemoglobin of 13.5, and platelets of 172K. -his last PSA remains undetectable -did not feel any enlarged lymph nodes during physical examination -discussed that it is most likely that patient may have worsened degenerative disks in the back and neck -patient has no active symptoms of uncontrolled cancer at this time -discussed option to schedule Tylenol 1000 MG twice a day to minimize the use of narcotics -discussed option of receiving a PSMA PET scan of the neck/lower back for further evaluation, which patient is agreeable to -discussed that if there is no obvious sign of cancer on imaging, there would be role to decide what to do next -discussed that if there is concern for  cancer-related pain, then it will be managed by [place], while degenerative changes shall be managed by his PCP  -okay to stop Zytiga until his PSMA PET scan  -educated patient that pain medication can cause confusion -will order a UA to ensure that patient has no urinary infection causing confusion -educated patient that there is a role to continue Prednisone as his adrenal glands are still suppressed -will increase prednisone to two tablets (10 MG total) daily -discussed that having a sleep deficit due to not sleeping optimally for some time can subsequently cause increased periods of sleeping  -educated patient that zytiga can cause fatigue, though fatigue is generally due to a combination of factors, including multiple mobility limitations, depression, and medication effects. -discussed option to focus on comfort care if there is concern for low quality of life -recommend patient to connect with his ophthalmologist to discuss eye issues.  -discussed that there is a role for his medications to be simplified by his PCP -answered all of patient's and his children's questions in detail   FOLLOW-UP: Labs today PSMA PET in 4-5 days Phone visit with Dr Candise Che in 2 weeks  The total time spent in the appointment was 32 minutes* .  All of the patient's questions were answered with apparent satisfaction. The patient knows to call the clinic with any problems, questions or concerns.   Wyvonnia Lora MD MS AAHIVMS [facility] Hematology/Oncology Physician [facility]  .*Total Encounter Time as defined by the Centers for Medicare and Medicaid Services includes, in addition to the face-to-face time of a patient visit (documented in the note above) non-face-to-face time: obtaining and reviewing outside history, ordering and reviewing medications, tests or procedures, care coordination (communications with other health care professionals or caregivers) and documentation in the  medical record.     I,Mitra Faeizi,acting as a Neurosurgeon for Wyvonnia Lora, MD.,have documented all relevant documentation on the behalf of Wyvonnia Lora, MD,as directed by  Wyvonnia Lora, MD while in the presence of Wyvonnia Lora, MD.  .I have reviewed the above documentation for accuracy and completeness, and I agree with the above. Johney Maine MD

## 2023-04-13 ENCOUNTER — Inpatient Hospital Stay (HOSPITAL_BASED_OUTPATIENT_CLINIC_OR_DEPARTMENT_OTHER): Payer: Medicare Other | Admitting: Hematology

## 2023-04-13 ENCOUNTER — Inpatient Hospital Stay: Payer: Medicare Other

## 2023-04-13 ENCOUNTER — Other Ambulatory Visit: Payer: Self-pay

## 2023-04-13 ENCOUNTER — Other Ambulatory Visit: Payer: Medicare Other

## 2023-04-13 VITALS — BP 170/69 | HR 45 | Temp 97.6°F | Resp 20 | Wt 261.3 lb

## 2023-04-13 DIAGNOSIS — Z7901 Long term (current) use of anticoagulants: Secondary | ICD-10-CM | POA: Diagnosis not present

## 2023-04-13 DIAGNOSIS — C772 Secondary and unspecified malignant neoplasm of intra-abdominal lymph nodes: Secondary | ICD-10-CM | POA: Diagnosis not present

## 2023-04-13 DIAGNOSIS — R300 Dysuria: Secondary | ICD-10-CM

## 2023-04-13 DIAGNOSIS — C775 Secondary and unspecified malignant neoplasm of intrapelvic lymph nodes: Secondary | ICD-10-CM | POA: Diagnosis not present

## 2023-04-13 DIAGNOSIS — R5383 Other fatigue: Secondary | ICD-10-CM

## 2023-04-13 DIAGNOSIS — Z87891 Personal history of nicotine dependence: Secondary | ICD-10-CM | POA: Diagnosis not present

## 2023-04-13 DIAGNOSIS — C61 Malignant neoplasm of prostate: Secondary | ICD-10-CM

## 2023-04-13 DIAGNOSIS — C7951 Secondary malignant neoplasm of bone: Secondary | ICD-10-CM | POA: Diagnosis not present

## 2023-04-13 DIAGNOSIS — Z79899 Other long term (current) drug therapy: Secondary | ICD-10-CM | POA: Diagnosis not present

## 2023-04-13 DIAGNOSIS — Z8551 Personal history of malignant neoplasm of bladder: Secondary | ICD-10-CM | POA: Diagnosis not present

## 2023-04-13 DIAGNOSIS — Z7952 Long term (current) use of systemic steroids: Secondary | ICD-10-CM | POA: Diagnosis not present

## 2023-04-13 DIAGNOSIS — R519 Headache, unspecified: Secondary | ICD-10-CM | POA: Diagnosis not present

## 2023-04-13 LAB — CBC WITH DIFFERENTIAL (CANCER CENTER ONLY)
Abs Immature Granulocytes: 0.04 10*3/uL (ref 0.00–0.07)
Basophils Absolute: 0.1 10*3/uL (ref 0.0–0.1)
Basophils Relative: 1 %
Eosinophils Absolute: 0.2 10*3/uL (ref 0.0–0.5)
Eosinophils Relative: 3 %
HCT: 42.2 % (ref 39.0–52.0)
Hemoglobin: 13.5 g/dL (ref 13.0–17.0)
Immature Granulocytes: 1 %
Lymphocytes Relative: 11 %
Lymphs Abs: 0.9 10*3/uL (ref 0.7–4.0)
MCH: 28.8 pg (ref 26.0–34.0)
MCHC: 32 g/dL (ref 30.0–36.0)
MCV: 90 fL (ref 80.0–100.0)
Monocytes Absolute: 0.7 10*3/uL (ref 0.1–1.0)
Monocytes Relative: 9 %
Neutro Abs: 6.2 10*3/uL (ref 1.7–7.7)
Neutrophils Relative %: 75 %
Platelet Count: 172 10*3/uL (ref 150–400)
RBC: 4.69 MIL/uL (ref 4.22–5.81)
RDW: 14.6 % (ref 11.5–15.5)
WBC Count: 8.1 10*3/uL (ref 4.0–10.5)
nRBC: 0 % (ref 0.0–0.2)

## 2023-04-13 LAB — CMP (CANCER CENTER ONLY)
ALT: 12 U/L (ref 0–44)
AST: 29 U/L (ref 15–41)
Albumin: 3.9 g/dL (ref 3.5–5.0)
Alkaline Phosphatase: 198 U/L — ABNORMAL HIGH (ref 38–126)
Anion gap: 7 (ref 5–15)
BUN: 15 mg/dL (ref 8–23)
CO2: 29 mmol/L (ref 22–32)
Calcium: 9.5 mg/dL (ref 8.9–10.3)
Chloride: 101 mmol/L (ref 98–111)
Creatinine: 0.86 mg/dL (ref 0.61–1.24)
GFR, Estimated: >60 mL/min (ref >60)
Glucose, Bld: 141 mg/dL — ABNORMAL HIGH (ref 70–99)
Potassium: 3.9 mmol/L (ref 3.5–5.1)
Sodium: 137 mmol/L (ref 135–145)
Total Bilirubin: 1 mg/dL (ref 0.0–1.2)
Total Protein: 7.1 g/dL (ref 6.5–8.1)

## 2023-04-13 LAB — URINALYSIS, COMPLETE (UACMP) WITH MICROSCOPIC
Bacteria, UA: NONE SEEN
Bilirubin Urine: NEGATIVE
Glucose, UA: NEGATIVE mg/dL
Hgb urine dipstick: NEGATIVE
Ketones, ur: NEGATIVE mg/dL
Leukocytes,Ua: NEGATIVE
Nitrite: NEGATIVE
Protein, ur: 30 mg/dL — AB
Specific Gravity, Urine: 1.017 (ref 1.005–1.030)
pH: 5 (ref 5.0–8.0)

## 2023-04-13 MED ORDER — PREDNISONE 5 MG PO TABS: 10.0000 mg | ORAL_TABLET | Freq: Every day | ORAL | 1 refills | Status: DC

## 2023-04-14 ENCOUNTER — Telehealth: Payer: Self-pay | Admitting: Hematology

## 2023-04-14 LAB — URINE CULTURE: Culture: NO GROWTH

## 2023-04-14 LAB — PROSTATE-SPECIFIC AG, SERUM (LABCORP): Prostate Specific Ag, Serum: <0.1 ng/mL (ref 0.0–4.0)

## 2023-04-14 NOTE — Telephone Encounter (Signed)
Left patient a vm regarding upcoming appointment

## 2023-04-25 DIAGNOSIS — C61 Malignant neoplasm of prostate: Secondary | ICD-10-CM | POA: Diagnosis not present

## 2023-04-27 ENCOUNTER — Encounter (HOSPITAL_COMMUNITY)
Admission: RE | Admit: 2023-04-27 | Discharge: 2023-04-27 | Disposition: A | Discharge status: Home / Self Care | Payer: Medicare Other | Source: Ambulatory Visit | Attending: Hematology | Admitting: Hematology

## 2023-04-27 DIAGNOSIS — C61 Malignant neoplasm of prostate: Secondary | ICD-10-CM | POA: Diagnosis not present

## 2023-04-27 DIAGNOSIS — C772 Secondary and unspecified malignant neoplasm of intra-abdominal lymph nodes: Secondary | ICD-10-CM | POA: Insufficient documentation

## 2023-04-27 DIAGNOSIS — R918 Other nonspecific abnormal finding of lung field: Secondary | ICD-10-CM | POA: Diagnosis not present

## 2023-04-27 MED ORDER — FLOTUFOLASTAT F 18 GALLIUM 296-5846 MBQ/ML IV SOLN
8.0000 | Freq: Once | INTRAVENOUS | Status: AC
  Administered 2023-04-27: 7.8 via INTRAVENOUS

## 2023-04-27 NOTE — Progress Notes (Signed)
 HEMATOLOGY/ONCOLOGY PHONE VISIT NOTE  Date of Service:  04/28/2023   Patient Care Team: Johnny Nam, MD as PCP - General (Family Medicine) Johnny Bollman, MD as PCP - Cardiology (Cardiology) Johnny Morale, MD as Consulting Physician (Cardiology) [name] as Physician Assistant (Cardiology) Johnny Cushing, RN as Oncology Nurse Navigator Johnny, [name], NP as Nurse Practitioner (Nurse Practitioner) Johnny Hick, MD as Consulting Physician (Urology) [name], P.A. (Ophthalmology)  CHIEF COMPLAINTS/PURPOSE OF CONSULTATION:   Malignant neoplasm of prostate metastatic to intra-abdominal lymph node [facility])   Prior Therapy: CT-guided biopsy of the left pelvic lymph nodes on June 02, 2021 showed metastatic carcinoma staining positive for PSA.   Current therapy:   Eligard 45 mg every 6 months started in June 2023.  He is currently receiving that under the care of [facility].   Zytiga 1000 mg with prednisone 5 mg started on August 03, 2021.  His dose was reduced to 500 mg daily dose starting in September 2023.and dpwn to 500mg  po daily from 12/20/2022  HISTORY OF PRESENTING ILLNESS:   [name] is a wonderful 89 y.o. male who is here for continued evaluation and management of Malignant neoplasm of prostate metastatic to intra-abdominal lymph node (HCC). Patient was getting followed up by [name]. Patient is accompanied by his daughter during this visit.   Patient was initially diagnosed with advanced prostate cancer in May 2023. He also had castration-sensitive with lymphadenopathy and bone disease, PSA with 24 at the time of diagnosis. He was also previously diagnosed with superficial bladder tumor in 2012 and developed relapsed disease in May 2023 with CIS.  Patient's current treatment includes Eligard 45 mg every 6 months which was started in June 2023 and Zytiga 500 mg with Prednisone 5 mg. He previously on Zytiga 1000 mg from  August 03, 2021 to September 2023. Reduced to 500 mg in September 2023.   Patient was last seen by [name] on 01/18/2022 and he was doing well overall. He denied of any toxicities with Zytiga after reducing the dosage. During the last visit, Foley catheter was removed.   He reports he has been doing well overall since his last visit with [name]. He denies fever, chills, night sweats, new lumps/bumps, urinary problems, abnormal bowel movement, unexpected weight loss, bone pain, abdominal pain, chest pain, back pain. However, he does complain of increased fatigue, occasional bilateral leg swelling, occasional shortness of breath, and occasional constipation. His fatigue is affecting his daily activities, such as getting tired making breakfast. He notes that his fatigue causes bilateral leg weakness.  Patient's daughter notes that the patient recently had sinus infection since the last visit. He has recovered well from the infection. His daughter also reports that the patient has decreased appetite, but his weight has been stable.   He has dentures and no other dental issues.   He is complaint with all of his medications.   Patient's daughter notes that he has a blister on his right feet sole, which is affecting his walking and movement.   INTERVAL HISTORY:  [name] is a wonderful 89 y.o. male who is here for continued evaluation and management of Malignant neoplasm of prostate metastatic to intra-abdominal lymph node (HCC).   Patient was last seen by me on 04/13/2023 and complained of plenty of bilateral back pain, worsened headache/cervicalgia, double vision, mild swelling in the right eyelid, mild leg swelling, depression, decreased appetite, change in his taste buds,  confusion issues, extreme fatigue/weakness, and increased sleeping habits with use of narcotics.   I connected with [name] on 04/28/2023 at  3:30 PM EST by telephone visit and verified that I am speaking with the  correct person using two identifiers.   I discussed the limitations, risks, security and privacy concerns of performing an evaluation and management service by telemedicine and the availability of in-person appointments. I also discussed with the patient that there may be a patient responsible charge related to this service. The patient expressed understanding and agreed to proceed.   Other persons participating in the visit and their role in the encounter: his son, [name], and his daughter, [name]  Patient's location: home Provider's location: [facility]   Chief Complaint: continued evaluation and management of Malignant neoplasm of prostate metastatic to intra-abdominal lymph node (HCC).     Today, he reports lower back pain, sudden right shoulder pain, and leg pain sometimes radiating up to the groin.   His son reports that taking two tablets of Tramadol does not improve his pain well. Tylenol generally improves pain better than Tramadol.   Patient did endorse headaches, which have improved since being off of Zytiga. Patient has not had any significant difference in his fatigue since holding Zytiga.   MEDICAL HISTORY:  Past Medical History:  Diagnosis Date   AAA (abdominal aortic aneurysm) (HCC)    s/p stent graft repair 2012   Arthritis    "right ankle" (04/17/2014)   Ascending aorta dilatation (HCC)    Echo 2021: 42 mm // Echocardiogram 10/22: EF 55-60, no RWMA, mildly reduced RVSF, RVSP 39.7, severe BAE, trivial MR, mod TR, trivial AI, mild AS (mean 8), ascending aorta 40 mm   Atrial fibrillation (HCC)    a. permanent;  b. Hematuria on Pradaxa;  c. cold intol and chills with Xarelto   Bladder cancer (HCC)    Cardiomyopathy (HCC)    a. Echo (03/2013): Inferior and inferolateral AK, EF 40-45%, mild MR, mod LAE, mod RAE, mod TR, mild PI, PASP 46   Chronic kidney disease    COPD (chronic obstructive pulmonary disease) (HCC)    Coronary artery disease s/p cabg x7  1992   a. s/p CABG in 1992 (X 7  - Dr. Andrey Campanile);  b.  Myoview (01/2011): Fixed inferior defect suggestive of prior MI versus diaphragmatic attenuation, apical reversibility possibly suspicious for small area of infarction with peri-infarct ischemia, no significant change from prior study, not gated, low risk study;  c.  Echo (05/2010): Mod LVH, EF 55-65%, mild AI, mild MR, moderate LAE, mild RAE, moderate    Hematuria bladder tumor ---  followed by Dr Vernie Ammons   Hiatal hernia    repaired in 1992 w/[facility]   Hx of cardiovascular stress test    Lexiscan Myoview (03/2013):  No ischemia; not gated.   Hyperglycemia diet controlled   Hyperlipidemia    Hypertension    Hypothyroidism    Obesity    Organic impotence    Prostate cancer (HCC)    RBBB    S/P AAA repair using bifurcation graft    Sleep apnea no rx cpap   mild- tested  2012   Ventricular ectopy    mild    SURGICAL HISTORY: Past Surgical History:  Procedure Laterality Date   ABDOMINAL AORTIC ANEURYSM REPAIR  02/28/2010   using bifurcation graft   CARDIAC CATHETERIZATION  03/31/1990   Positive   CARDIOVASCULAR STRESS TEST  02-01-2011  NUCLEAR NO EXERCISE   LOW RISK  STUDY.  SMALL APICAL DEFECT. NO EVIDENCE OF MULTIZONE ISCHEMIA.   CORONARY ARTERY BYPASS GRAFT  05/30/1990   CABG X 7   CYSTOSCOPY N/A 01/07/2022   Procedure: CYSTOSCOPY, BLADDER BIOPSY WITH FULGERATION BILATERAL RETROGRADE PYELOGRAM;  Surgeon: Johnny Hick, MD;  Location: [facility];  Service: Urology;  Laterality: N/A;  45 MINUTES NEEDED FOR CASE  INTUBATED PARALYZED   CYSTOSCOPY WITH BIOPSY N/A 07/23/2021   Procedure: CYSTOSCOPY WITH  BLADDER BIOPSY AND BILATERAL RETROGRADE PYELOGRAM;  Surgeon: Johnny Hick, MD;  Location: [facility];  Service: Urology;  Laterality: N/A;   ENDOVASCULAR STENT INSERTION  02/15/2011   Procedure: ENDOVASCULAR STENT GRAFT INSERTION;  Surgeon: Nilda Simmer, MD;  Location: [facility] OR;  Service: Vascular;  Laterality: N/A;  Insertion of endovascular stent graft    ETT  10/31/2000    [facility]NL   HIATAL HERNIA REPAIR  02/28/1990   "repaired when I had my OHS"   Galvan - Afib  03/28/2002   INGUINAL HERNIA REPAIR Left 03/01/1995   TOTAL ANKLE ARTHROPLASTY Right 04/17/2014   Procedure: RIGHT TOTAL ANKLE ARTHOPLASTY;  Surgeon: Toni Arthurs, MD;  Location: [facility];  Service: Orthopedics;  Laterality: Right;   TRANSURETHRAL RESECTION OF BLADDER TUMOR  03/25/2011   Procedure: TRANSURETHRAL RESECTION OF BLADDER TUMOR (TURBT);  Surgeon: Garnett Farm, MD;  Location: [facility];  Service: Urology;  Laterality: N/A;    SOCIAL HISTORY: Social History   Socioeconomic History   Marital status: Widowed    Spouse name: Not on file   Number of children: Not on file   Years of education: Not on file   Highest education level: Not on file  Occupational History   Occupation: retired Water quality scientist.    Employer: retired    Comment: 2001. Likes to play golf . Travels with motor home  Tobacco Use   Smoking status: Former    Current packs/day: 0.00    Average packs/day: 1.3 packs/day for 30.0 years (37.5 ttl pk-yrs)    Types: Cigarettes    Start date: 02/29/1960    Quit date: 02/28/1990    Years since quitting: 33.1   Smokeless tobacco: Never  Vaping Use   Vaping status: Never Used  Substance and Sexual Activity   Alcohol use: No    Alcohol/week: 0.0 standard drinks of alcohol    Comment: basically no alcohol use.  only very rare alcohol use.    Drug use: No   Sexual activity: Yes  Other Topics Concern   Not on file  Social History Narrative   From [place]   Lives with girlfriend.     Divorced, remarried then widowed, 2 children alive- (prev with 3 children- from 1st marriage)   Likes to golf   Prev 4 sport athlete in HS, football fan.    Social Drivers of Corporate investment banker Strain: Low Risk  (01/03/2023)   Overall Financial Resource Strain (CARDIA)    Difficulty of Paying Living Expenses: Not hard at all  Food Insecurity: No Food  Insecurity (01/03/2023)   Hunger Vital Sign    Worried About Running Out of Food in the Last Year: Never true    Ran Out of Food in the Last Year: Never true  Transportation Needs: No Transportation Needs (01/03/2023)   PRAPARE - Administrator, Civil Service (Medical): No    Lack of Transportation (Non-Medical): No  Physical Activity: Inactive (01/03/2023)   Exercise Vital Sign    Days of Exercise  per Week: 0 days    Minutes of Exercise per Session: 0 min  Stress: No Stress Concern Present (01/03/2023)   [employer] of Occupational Health - Occupational Stress Questionnaire    Feeling of Stress : Not at all  Social Connections: Socially Isolated (01/03/2023)   Social Connection and Isolation Panel [NHANES]    Frequency of Communication with Friends and Family: Three times a week    Frequency of Social Gatherings with Friends and Family: Twice a week    Attends Religious Services: Never    Database administrator or Organizations: No    Attends Banker Meetings: Never    Marital Status: Widowed  Intimate Partner Violence: Not At Risk (01/03/2023)   Humiliation, Afraid, Rape, and Kick questionnaire    Fear of Current or Ex-Partner: No    Emotionally Abused: No    Physically Abused: No    Sexually Abused: No    FAMILY HISTORY: Family History  Problem Relation Age of Onset   Obesity Mother 25       deceased   Hypertension Mother    Diabetes Mother    Heart disease Mother        before age 76   Hyperlipidemia Mother    Alcohol abuse Sister 2       deceased   Other Father        deceased unknown   Colon cancer Neg Hx    Prostate cancer Neg Hx    Anesthesia problems Neg Hx     ALLERGIES:  is allergic to dabigatran etexilate mesylate, xarelto [rivaroxaban], and metoprolol.  MEDICATIONS:  Current Outpatient Medications  Medication Sig Dispense Refill   abiraterone acetate (ZYTIGA) 250 MG tablet Take 1 tablet (250 mg total) by mouth daily. Take on  an empty stomach 1 hour before or 2 hours after a meal 30 tablet 0   atorvastatin (LIPITOR) 40 MG tablet TAKE ONE TABLET BY MOUTH ONCE A DAY 90 tablet 0   docusate sodium (COLACE) 100 MG capsule Take 1 capsule (100 mg total) by mouth daily as needed for up to 30 doses. 30 capsule 0   ELIQUIS 5 MG TABS tablet TAKE ONE TABLET BY MOUTH TWICE (2) DAILY 60 tablet 5   furosemide (LASIX) 20 MG tablet TAKE ONE TABLET (20 MG TOTAL) BY MOUTH TWO TIMES DAILY. 60 tablet 0   levothyroxine (SYNTHROID) 137 MCG tablet TAKE ONE TABLET BY MOUTH ONCE A DAY ON AN EMPTY STOMACH 90 tablet 0   predniSONE (DELTASONE) 5 MG tablet Take 2 tablets (10 mg total) by mouth daily with breakfast. 90 tablet 1   ramipril (ALTACE) 5 MG capsule TAKE ONE CAPSULE BY MOUTH TWO TIMES DAILY 180 capsule 1   tamsulosin (FLOMAX) 0.4 MG CAPS capsule Take 0.4 mg by mouth at bedtime.     traMADol (ULTRAM) 50 MG tablet Take 1 tablet (50 mg total) by mouth every 6 (six) hours as needed. 45 tablet 0   No current facility-administered medications for this visit.   Facility-Administered Medications Ordered in Other Visits  Medication Dose Route Frequency Provider Last Rate Last Admin   flotufolastat F 18 Gallium (POSLUMA) [phone] MBQ/ML 8 millicurie  8 millicurie Intravenous Once Ramiro Harvest, Nisarg A, MD        REVIEW OF SYSTEMS:    10 Point review of Systems was done is negative except as noted above.   PHYSICAL EXAMINATION: TELEMEDICINE VISIT   LABORATORY DATA:  I have reviewed the data as listed .  Latest Ref Rng & Units 04/13/2023   11:44 AM 03/07/2023    1:25 PM 12/20/2022    1:43 PM  CBC  WBC 4.0 - 10.5 K/uL 8.1  7.5  6.2   Hemoglobin 13.0 - 17.0 g/dL 16.1  09.6  04.5   Hematocrit 39.0 - 52.0 % 42.2  41.7  40.7   Platelets 150 - 400 K/uL 172  221  179    .    Latest Ref Rng & Units 04/13/2023   11:44 AM 03/07/2023    1:25 PM 12/20/2022    1:43 PM  CMP  Glucose 70 - 99 mg/dL 409  811  914   BUN 8 - 23 mg/dL 15  11  13     Creatinine 0.61 - 1.24 mg/dL 7.82  9.56  2.13   Sodium 135 - 145 mmol/L 137  140  139   Potassium 3.5 - 5.1 mmol/L 3.9  3.9  3.6   Chloride 98 - 111 mmol/L 101  104  107   CO2 22 - 32 mmol/L 29  31  25    Calcium 8.9 - 10.3 mg/dL 9.5  9.4  9.3   Total Protein 6.5 - 8.1 g/dL 7.1  7.1  6.8   Total Bilirubin 0.0 - 1.2 mg/dL 1.0  0.8  0.9   Alkaline Phos 38 - 126 U/L 198  132  82   AST 15 - 41 U/L 29  22  10    ALT 0 - 44 U/L 12  11  8     PSA <0.1  RADIOGRAPHIC STUDIES: I have personally reviewed the radiological images as listed and agreed with the findings in the report. No results found.  ASSESSMENT & PLAN:   89 y.o. male with:  1.  Castration-sensitive advanced prostate cancer with lymphadenopathy diagnosed in May 2023.    2.  Androgen deprivation: He will continue to receive Eligard every 6 months under the care of Dr. Cardell Peach and urology. Was last given in December 2023.    3.  Bone directed therapy: He is to continue calcium and vitamin D supplements.    4.  Bladder cancer: He continues to follow with urology regarding this issue.  He has carcinoma in situ on last cystoscopy.  5.  Hypertension: His blood pressure is elevated today and normally is during doctor's visits.  Ambulatory blood pressure monitoring per his report are low are close to normal range.  I have asked him to check his blood pressure periodically and address that with his primary care physician if he is continue to have elevated blood pressure at home.    6.  Urinary retention: Resolved and his catheter has been removed.  Continues to follow with Dr. Cardell Peach regarding this issue.   PLAN:  -Discussed lab results from 04/13/2023 in detail with patient. CBC normal; showed WBC of 8.1K, hemoglobin of 13.5, and platelets of 172K. -UA showed no signs of an infection -PSA continues to be undetectable -CMP shows stable liver and kidney function. His Alkaline phosphatase has increased to 198 U/L, which could be related to  bone changes.  -Patient did have a PSMA PET scan yesterday, 04/27/2023, which has not yet been officially read. Discussed concern that there does appear to be some activity on his PET scan, including in the sacral spine, right hip joint, other areas of the pelvis, which is concerning for recurrence of disease. Also discussed potential involvement of the liver, which could be from inflammation or radiologist may feel is related to  obvious cancer. There does not appear to be any obvious findings in the shoulder that would explain his sudden right shoulder pain. Will await official reading from radiologist.  -discussed unusual finding of his prostate cancer no longer correlating with PSA levels if he does have active disease at this time. Discussed that this could happen when prostate cancer redifferentiates and no longer makes PSA protein.  -discussed that there may be a role for a biopsy of a spot in the bone or liver to evaluate whether his prostate cancer may have changed form such as to small cell cancer of the prostate.  -discussed that if there is concern or either small cell cancer of the prostate or recurrent regular protate cancer, there would be a need to consider the next line treatment -discussed that if there is confirmation of active disease, there would be the option of palliative radiation for local pain control.  -There would be a need for a discussion to take place in regards to other treatment options that patient would like to consider.  -discussed that if he is a candidate, there may potentially be the option of chemotherapy to control disease -Zytiga does not appear to control his Prostate cancer well -discussed that his fatigue is most likely due to a combination of factors, including pain, age, cancer, etc.  -will stop tramadol -will order Hydrocodone combined with tylenol for pain management -will order Senna to avoid constipation with pain medication  FOLLOW-UP: TBD pending  pet/ct results   The total time spent in the appointment was 20 minutes* .  All of the patient's questions were answered with apparent satisfaction. The patient knows to call the clinic with any problems, questions or concerns.   Wyvonnia Lora MD MS AAHIVMS [facility] Hematology/Oncology Physician [place]  .*Total Encounter Time as defined by the Centers for Medicare and Medicaid Services includes, in addition to the face-to-face time of a patient visit (documented in the note above) non-face-to-face time: obtaining and reviewing outside history, ordering and reviewing medications, tests or procedures, care coordination (communications with other health care professionals or caregivers) and documentation in the medical record.   I,Mitra Faeizi,acting as a Neurosurgeon for Wyvonnia Lora, MD.,have documented all relevant documentation on the behalf of Wyvonnia Lora, MD,as directed by  Wyvonnia Lora, MD while in the presence of Wyvonnia Lora, MD.  .I have reviewed the above documentation for accuracy and completeness, and I agree with the above. Johney Maine MD

## 2023-04-28 ENCOUNTER — Other Ambulatory Visit: Payer: Self-pay

## 2023-04-28 ENCOUNTER — Inpatient Hospital Stay (HOSPITAL_BASED_OUTPATIENT_CLINIC_OR_DEPARTMENT_OTHER): Payer: Medicare Other | Admitting: Hematology

## 2023-04-28 ENCOUNTER — Encounter: Payer: Self-pay | Admitting: Hematology

## 2023-04-28 DIAGNOSIS — R519 Headache, unspecified: Secondary | ICD-10-CM | POA: Diagnosis not present

## 2023-04-28 DIAGNOSIS — C775 Secondary and unspecified malignant neoplasm of intrapelvic lymph nodes: Secondary | ICD-10-CM | POA: Diagnosis not present

## 2023-04-28 DIAGNOSIS — C61 Malignant neoplasm of prostate: Secondary | ICD-10-CM | POA: Diagnosis not present

## 2023-04-28 DIAGNOSIS — C772 Secondary and unspecified malignant neoplasm of intra-abdominal lymph nodes: Secondary | ICD-10-CM

## 2023-04-28 DIAGNOSIS — Z8551 Personal history of malignant neoplasm of bladder: Secondary | ICD-10-CM | POA: Diagnosis not present

## 2023-04-28 DIAGNOSIS — Z87891 Personal history of nicotine dependence: Secondary | ICD-10-CM | POA: Diagnosis not present

## 2023-04-28 DIAGNOSIS — C7951 Secondary malignant neoplasm of bone: Secondary | ICD-10-CM | POA: Diagnosis not present

## 2023-04-28 MED ORDER — HYDROCODONE-ACETAMINOPHEN 5-325 MG PO TABS: 1.0000 | ORAL_TABLET | Freq: Four times a day (QID) | ORAL | 0 refills | Status: DC | PRN

## 2023-04-28 MED ORDER — SENNOSIDES-DOCUSATE SODIUM 8.6-50 MG PO TABS: 2.0000 | ORAL_TABLET | Freq: Every day | ORAL | 1 refills | Status: DC

## 2023-05-02 DIAGNOSIS — C61 Malignant neoplasm of prostate: Secondary | ICD-10-CM | POA: Diagnosis not present

## 2023-05-02 DIAGNOSIS — N401 Enlarged prostate with lower urinary tract symptoms: Secondary | ICD-10-CM | POA: Diagnosis not present

## 2023-05-02 DIAGNOSIS — C678 Malignant neoplasm of overlapping sites of bladder: Secondary | ICD-10-CM | POA: Diagnosis not present

## 2023-05-02 DIAGNOSIS — R310 Gross hematuria: Secondary | ICD-10-CM | POA: Diagnosis not present

## 2023-05-02 DIAGNOSIS — R3912 Poor urinary stream: Secondary | ICD-10-CM | POA: Diagnosis not present

## 2023-05-05 ENCOUNTER — Telehealth: Payer: Self-pay | Admitting: Medical Oncology

## 2023-05-05 NOTE — Telephone Encounter (Addendum)
 [name] called and asked to set up f/u appt.to review PET scan. Dr. Candise Che notifed.

## 2023-05-05 NOTE — Telephone Encounter (Signed)
 Pet scan is final and in chart..Dr.Kale notified

## 2023-05-09 ENCOUNTER — Inpatient Hospital Stay: Attending: Hematology | Admitting: Hematology

## 2023-05-09 ENCOUNTER — Other Ambulatory Visit: Payer: Self-pay

## 2023-05-09 VITALS — BP 166/59 | HR 44 | Temp 97.4°F | Resp 18 | Ht 71.0 in | Wt 256.6 lb

## 2023-05-09 DIAGNOSIS — C772 Secondary and unspecified malignant neoplasm of intra-abdominal lymph nodes: Secondary | ICD-10-CM

## 2023-05-09 DIAGNOSIS — C61 Malignant neoplasm of prostate: Secondary | ICD-10-CM

## 2023-05-09 DIAGNOSIS — Z8551 Personal history of malignant neoplasm of bladder: Secondary | ICD-10-CM | POA: Insufficient documentation

## 2023-05-09 DIAGNOSIS — C799 Secondary malignant neoplasm of unspecified site: Secondary | ICD-10-CM

## 2023-05-09 DIAGNOSIS — Z87891 Personal history of nicotine dependence: Secondary | ICD-10-CM | POA: Insufficient documentation

## 2023-05-09 DIAGNOSIS — N189 Chronic kidney disease, unspecified: Secondary | ICD-10-CM | POA: Insufficient documentation

## 2023-05-09 DIAGNOSIS — I129 Hypertensive chronic kidney disease with stage 1 through stage 4 chronic kidney disease, or unspecified chronic kidney disease: Secondary | ICD-10-CM | POA: Insufficient documentation

## 2023-05-09 DIAGNOSIS — C775 Secondary and unspecified malignant neoplasm of intrapelvic lymph nodes: Secondary | ICD-10-CM | POA: Insufficient documentation

## 2023-05-09 DIAGNOSIS — C7951 Secondary malignant neoplasm of bone: Secondary | ICD-10-CM | POA: Insufficient documentation

## 2023-05-09 DIAGNOSIS — Z7189 Other specified counseling: Secondary | ICD-10-CM

## 2023-05-09 MED ORDER — DEXAMETHASONE 4 MG PO TABS: 4.0000 mg | ORAL_TABLET | Freq: Two times a day (BID) | ORAL | 1 refills | Status: DC

## 2023-05-09 MED ORDER — HYDROCODONE-ACETAMINOPHEN 5-325 MG PO TABS
1.0000 | ORAL_TABLET | ORAL | 0 refills | Status: DC | PRN
Start: 2023-05-09 — End: 2023-06-20

## 2023-05-09 MED ORDER — POLYETHYLENE GLYCOL 3350 17 G PO PACK: 17.0000 g | PACK | Freq: Every day | ORAL | 0 refills | Status: DC

## 2023-05-09 MED ORDER — FENTANYL 12 MCG/HR TD PT72: 1.0000 | MEDICATED_PATCH | TRANSDERMAL | 0 refills | Status: DC

## 2023-05-09 MED ORDER — SENNOSIDES-DOCUSATE SODIUM 8.6-50 MG PO TABS: 2.0000 | ORAL_TABLET | Freq: Two times a day (BID) | ORAL | 1 refills | Status: DC

## 2023-05-09 MED ORDER — ONDANSETRON HCL 8 MG PO TABS: 8.0000 mg | ORAL_TABLET | Freq: Three times a day (TID) | ORAL | 0 refills | Status: DC | PRN

## 2023-05-09 NOTE — Progress Notes (Signed)
 HEMATOLOGY/ONCOLOGY CLINIC NOTE  Date of Service:  05/09/23    Patient Care Team: Joaquim Nam, MD as PCP - General (Family Medicine) Tonny Bollman, MD as PCP - Cardiology (Cardiology) Laurey Morale, MD as Consulting Physician (Cardiology) Kennon Rounds as Physician Assistant (Cardiology) Cherlyn Cushing, RN as Oncology Nurse Navigator [name], [name], NP as Nurse Practitioner (Nurse Practitioner) [name], MD as Consulting Physician (Urology) [facility] (Ophthalmology)  CHIEF COMPLAINTS/PURPOSE OF CONSULTATION:   Malignant neoplasm of prostate metastatic to intra-abdominal lymph node [facility])   CT-guided biopsy of the left pelvic lymph nodes on June 02, 2021 showed metastatic carcinoma staining positive for PSA.   Current therapy:   Eligard 45 mg every 6 months started in June 2023.  He is currently receiving that under the care of [facility].   Zytiga 1000 mg with prednisone 5 mg started on August 03, 2021.  His dose was reduced to 500 mg daily dose starting in September 2023.and dpwn to 500mg  po daily from 12/20/2022  HISTORY OF PRESENTING ILLNESS:   [name] is a wonderful 89 y.o. male who is here for continued evaluation and management of Malignant neoplasm of prostate metastatic to intra-abdominal lymph node (HCC). Patient was getting followed up by [name]. Patient is accompanied by his daughter during this visit.   Patient was initially diagnosed with advanced prostate cancer in May 2023. He also had castration-sensitive with lymphadenopathy and bone disease, PSA with 24 at the time of diagnosis. He was also previously diagnosed with superficial bladder tumor in 2012 and developed relapsed disease in May 2023 with CIS.  Patient's current treatment includes Eligard 45 mg every 6 months which was started in June 2023 and Zytiga 500 mg with Prednisone 5 mg. He previously on Zytiga 1000 mg from August 03, 2021 to  September 2023. Reduced to 500 mg in September 2023.   Patient was last seen by [name] on 01/18/2022 and he was doing well overall. He denied of any toxicities with Zytiga after reducing the dosage. During the last visit, Foley catheter was removed.   He reports he has been doing well overall since his last visit with [name]. He denies fever, chills, night sweats, new lumps/bumps, urinary problems, abnormal bowel movement, unexpected weight loss, bone pain, abdominal pain, chest pain, back pain. However, he does complain of increased fatigue, occasional bilateral leg swelling, occasional shortness of breath, and occasional constipation. His fatigue is affecting his daily activities, such as getting tired making breakfast. He notes that his fatigue causes bilateral leg weakness.  Patient's daughter notes that the patient recently had sinus infection since the last visit. He has recovered well from the infection. His daughter also reports that the patient has decreased appetite, but his weight has been stable.   He has dentures and no other dental issues.   He is complaint with all of his medications.   Patient's daughter notes that he has a blister on his right feet sole, which is affecting his walking and movement.   INTERVAL HISTORY:  [name] is a wonderful 89 y.o. male who is here for continued evaluation and management of Malignant neoplasm of prostate metastatic to intra-abdominal lymph node (HCC).   I last connected with the patient on 04/28/2023, tele-med visit, and he complained of back pain, sudden right shoulder pain, and leg pain which occasionally radiates to his groin.   Patient is accompanied by his family members during  this visit. Patient notes he has been doing fairly well since our last visit. He complains of lower back pain that radiates to his legs, bilateral shoulder pain, and bilateral groin pain. Patient notes that groin pain has limited him from walking.    He denies any new infection issues, fever, chills, night sweats, hematuria, urinary symptoms, abdominal pain. He complains of bilateral leg swelling.   Patient also reports constipation due to pain medication, hydrocodone. He is currently taking 1 Hydrocodone every 3-4 hours, which temporarily helps his pain.   Patient's wife notes he has not been sleeping in bed, due to his back and leg pain. Patient complains of worsening eye sight. He reports of having double vision when he looks right.   Patient notes his last cystoscopy was around 1 year ago.   He has discontinued Lipitor.   Patient currently lives with his daughter.   His PSMA PET/CT was discussed in details.  MEDICAL HISTORY:  Past Medical History:  Diagnosis Date   AAA (abdominal aortic aneurysm) (HCC)    s/p stent graft repair 2012   Arthritis    "right ankle" (04/17/2014)   Ascending aorta dilatation (HCC)    Echo 2021: 42 mm // Echocardiogram 10/22: EF 55-60, no RWMA, mildly reduced RVSF, RVSP 39.7, severe BAE, trivial MR, mod TR, trivial AI, mild AS (mean 8), ascending aorta 40 mm   Atrial fibrillation (HCC)    a. permanent;  b. Hematuria on Pradaxa;  c. cold intol and chills with Xarelto   Bladder cancer (HCC)    Cardiomyopathy (HCC)    a. Echo (03/2013): Inferior and inferolateral AK, EF 40-45%, mild MR, mod LAE, mod RAE, mod TR, mild PI, PASP 46   Chronic kidney disease    COPD (chronic obstructive pulmonary disease) (HCC)    Coronary artery disease s/p cabg x7  1992   a. s/p CABG in 1992 (X 7 - Dr. Andrey Campanile);  b.  Myoview (01/2011): Fixed inferior defect suggestive of prior MI versus diaphragmatic attenuation, apical reversibility possibly suspicious for small area of infarction with peri-infarct ischemia, no significant change from prior study, not gated, low risk study;  c.  Echo (05/2010): Mod LVH, EF 55-65%, mild AI, mild MR, moderate LAE, mild RAE, moderate    Hematuria bladder tumor ---  followed by Dr Vernie Ammons    Hiatal hernia    repaired in 1992 w/OHS   Hx of cardiovascular stress test    Lexiscan Myoview (03/2013):  No ischemia; not gated.   Hyperglycemia diet controlled   Hyperlipidemia    Hypertension    Hypothyroidism    Obesity    Organic impotence    Prostate cancer (HCC)    RBBB    S/P AAA repair using bifurcation graft    Sleep apnea no rx cpap   mild- tested  2012   Ventricular ectopy    mild    SURGICAL HISTORY: Past Surgical History:  Procedure Laterality Date   ABDOMINAL AORTIC ANEURYSM REPAIR  02/28/2010   using bifurcation graft   CARDIAC CATHETERIZATION  03/31/1990   Positive   CARDIOVASCULAR STRESS TEST  02-01-2011  NUCLEAR NO EXERCISE   LOW RISK STUDY.  SMALL APICAL DEFECT. NO EVIDENCE OF MULTIZONE ISCHEMIA.   CORONARY ARTERY BYPASS GRAFT  05/30/1990   CABG X 7   CYSTOSCOPY N/A 01/07/2022   Procedure: CYSTOSCOPY, BLADDER BIOPSY WITH FULGERATION BILATERAL RETROGRADE PYELOGRAM;  Surgeon: [name], MD;  Location: [facility];  Service: Urology;  Laterality: N/A;  45  MINUTES NEEDED FOR CASE  INTUBATED PARALYZED   CYSTOSCOPY WITH BIOPSY N/A 07/23/2021   Procedure: CYSTOSCOPY WITH  BLADDER BIOPSY AND BILATERAL RETROGRADE PYELOGRAM;  Surgeon: [name], MD;  Location: [facility];  Service: Urology;  Laterality: N/A;   ENDOVASCULAR STENT INSERTION  02/15/2011   Procedure: ENDOVASCULAR STENT GRAFT INSERTION;  Surgeon: Nilda Simmer, MD;  Location: [facility] OR;  Service: Vascular;  Laterality: N/A;  Insertion of endovascular stent graft    ETT  10/31/2000   [facility]   HIATAL HERNIA REPAIR  02/28/1990   "repaired when I had my OHS"   Hospital - Afib  03/28/2002   INGUINAL HERNIA REPAIR Left 03/01/1995   TOTAL ANKLE ARTHROPLASTY Right 04/17/2014   Procedure: RIGHT TOTAL ANKLE ARTHOPLASTY;  Surgeon: Toni Arthurs, MD;  Location: [facility] OR;  Service: Orthopedics;  Laterality: Right;   TRANSURETHRAL RESECTION OF BLADDER TUMOR  03/25/2011   Procedure: TRANSURETHRAL RESECTION OF BLADDER  TUMOR (TURBT);  Surgeon: Garnett Farm, MD;  Location: [facility];  Service: Urology;  Laterality: N/A;    SOCIAL HISTORY: Social History   Socioeconomic History   Marital status: Widowed    Spouse name: Not on file   Number of children: Not on file   Years of education: Not on file   Highest education level: Not on file  Occupational History   Occupation: retired Water quality scientist.    Employer: retired    Comment: 2001. Likes to play golf . Travels with motor home  Tobacco Use   Smoking status: Former    Current packs/day: 0.00    Average packs/day: 1.3 packs/day for 30.0 years (37.5 ttl pk-yrs)    Types: Cigarettes    Start date: 02/29/1960    Quit date: 02/28/1990    Years since quitting: 33.2   Smokeless tobacco: Never  Vaping Use   Vaping status: Never Used  Substance and Sexual Activity   Alcohol use: No    Alcohol/week: 0.0 standard drinks of alcohol    Comment: basically no alcohol use.  only very rare alcohol use.    Drug use: No   Sexual activity: Yes  Other Topics Concern   Not on file  Social History Narrative   From [place]   Lives with girlfriend.     Divorced, remarried then widowed, 2 children alive- (prev with 3 children- from 1st marriage)   Likes to golf   Prev 4 sport athlete in HS, football fan.    Social Drivers of Corporate investment banker Strain: Low Risk  (01/03/2023)   Overall Financial Resource Strain (CARDIA)    Difficulty of Paying Living Expenses: Not hard at all  Food Insecurity: No Food Insecurity (01/03/2023)   Hunger Vital Sign    Worried About Running Out of Food in the Last Year: Never true    Ran Out of Food in the Last Year: Never true  Transportation Needs: No Transportation Needs (01/03/2023)   PRAPARE - Administrator, Civil Service (Medical): No    Lack of Transportation (Non-Medical): No  Physical Activity: Inactive (01/03/2023)   Exercise Vital Sign    Days of Exercise per Week: 0 days     Minutes of Exercise per Session: 0 min  Stress: No Stress Concern Present (01/03/2023)   [employer] of Occupational Health - Occupational Stress Questionnaire    Feeling of Stress : Not at all  Social Connections: Socially Isolated (01/03/2023)   Social Connection and Isolation Panel [NHANES]  Frequency of Communication with Friends and Family: Three times a week    Frequency of Social Gatherings with Friends and Family: Twice a week    Attends Religious Services: Never    Database administrator or Organizations: No    Attends Banker Meetings: Never    Marital Status: Widowed  Intimate Partner Violence: Not At Risk (01/03/2023)   Humiliation, Afraid, Rape, and Kick questionnaire    Fear of Current or Ex-Partner: No    Emotionally Abused: No    Physically Abused: No    Sexually Abused: No    FAMILY HISTORY: Family History  Problem Relation Age of Onset   Obesity Mother 9       deceased   Hypertension Mother    Diabetes Mother    Heart disease Mother        before age 15   Hyperlipidemia Mother    Alcohol abuse Sister 69       deceased   Other Father        deceased unknown   Colon cancer Neg Hx    Prostate cancer Neg Hx    Anesthesia problems Neg Hx     ALLERGIES:  is allergic to dabigatran etexilate mesylate, xarelto [rivaroxaban], and metoprolol.  MEDICATIONS:  Current Outpatient Medications  Medication Sig Dispense Refill   abiraterone acetate (ZYTIGA) 250 MG tablet Take 1 tablet (250 mg total) by mouth daily. Take on an empty stomach 1 hour before or 2 hours after a meal 30 tablet 0   atorvastatin (LIPITOR) 40 MG tablet TAKE ONE TABLET BY MOUTH ONCE A DAY 90 tablet 0   docusate sodium (COLACE) 100 MG capsule Take 1 capsule (100 mg total) by mouth daily as needed for up to 30 doses. 30 capsule 0   ELIQUIS 5 MG TABS tablet TAKE ONE TABLET BY MOUTH TWICE (2) DAILY 60 tablet 5   furosemide (LASIX) 20 MG tablet TAKE ONE TABLET (20 MG TOTAL) BY MOUTH  TWO TIMES DAILY. 60 tablet 0   HYDROcodone-acetaminophen (NORCO/VICODIN) 5-325 MG tablet Take 1-2 tablets by mouth every 6 (six) hours as needed for moderate pain (pain score 4-6) or severe pain (pain score 7-10). 50 tablet 0   levothyroxine (SYNTHROID) 137 MCG tablet TAKE ONE TABLET BY MOUTH ONCE A DAY ON AN EMPTY STOMACH 90 tablet 0   predniSONE (DELTASONE) 5 MG tablet Take 2 tablets (10 mg total) by mouth daily with breakfast. 90 tablet 1   ramipril (ALTACE) 5 MG capsule TAKE ONE CAPSULE BY MOUTH TWO TIMES DAILY 180 capsule 1   senna-docusate (SENNA S) 8.6-50 MG tablet Take 2 tablets by mouth at bedtime. 60 tablet 1   tamsulosin (FLOMAX) 0.4 MG CAPS capsule Take 0.4 mg by mouth at bedtime.     No current facility-administered medications for this visit.    REVIEW OF SYSTEMS:    10 Point review of Systems was done is negative except as noted above.   PHYSICAL EXAMINATION:  .BP (!) 166/59 (BP Location: Left Arm, Patient Position: Sitting) Comment: Nurse notified  Pulse (!) 44 Comment: Nurse notified  Temp (!) 97.4 F (36.3 C) (Temporal)   Resp 18   Ht 5\' 11"  (1.803 m)   Wt 256 lb 9.6 oz (116.4 kg)   SpO2 97%   BMI 35.79 kg/m   GENERAL:alert, in no acute distress and comfortable SKIN: no acute rashes, no significant lesions EYES: conjunctiva are pink and non-injected, sclera anicteric OROPHARYNX: MMM, no exudates, no oropharyngeal erythema  or ulceration NECK: supple, no JVD LYMPH:  no palpable lymphadenopathy in the cervical, axillary or inguinal regions LUNGS: clear to auscultation b/l with normal respiratory effort HEART: regular rate & rhythm ABDOMEN:  normoactive bowel sounds , non tender, not distended. Extremity: no pedal edema PSYCH: alert & oriented x 3 with fluent speech NEURO: no focal motor/sensory deficits    LABORATORY DATA:  I have reviewed the data as listed .    Latest Ref Rng & Units 04/13/2023   11:44 AM 03/07/2023    1:25 PM 12/20/2022    1:43 PM  CBC   WBC 4.0 - 10.5 K/uL 8.1  7.5  6.2   Hemoglobin 13.0 - 17.0 g/dL 81.1  91.4  78.2   Hematocrit 39.0 - 52.0 % 42.2  41.7  40.7   Platelets 150 - 400 K/uL 172  221  179    .    Latest Ref Rng & Units 04/13/2023   11:44 AM 03/07/2023    1:25 PM 12/20/2022    1:43 PM  CMP  Glucose 70 - 99 mg/dL 956  213  086   BUN 8 - 23 mg/dL 15  11  13    Creatinine 0.61 - 1.24 mg/dL 5.78  4.69  6.29   Sodium 135 - 145 mmol/L 137  140  139   Potassium 3.5 - 5.1 mmol/L 3.9  3.9  3.6   Chloride 98 - 111 mmol/L 101  104  107   CO2 22 - 32 mmol/L 29  31  25    Calcium 8.9 - 10.3 mg/dL 9.5  9.4  9.3   Total Protein 6.5 - 8.1 g/dL 7.1  7.1  6.8   Total Bilirubin 0.0 - 1.2 mg/dL 1.0  0.8  0.9   Alkaline Phos 38 - 126 U/L 198  132  82   AST 15 - 41 U/L 29  22  10    ALT 0 - 44 U/L 12  11  8     PSA <0.1  RADIOGRAPHIC STUDIES: I have personally reviewed the radiological images as listed and agreed with the findings in the report. NM PET (PSMA) SKULL TO MID THIGH Result Date: 05/05/2023 CLINICAL DATA:  Prostate carcinoma evaluate residual recurrent disease. EXAM: NUCLEAR MEDICINE PET SKULL BASE TO THIGH TECHNIQUE: 7.8 mCi F18 Piflufolastat (Pylarify) was injected intravenously. Full-ring PET imaging was performed from the skull base to thigh after the radiotracer. CT data was obtained and used for attenuation correction and anatomic localization. COMPARISON:  None Available. FINDINGS: NECK No radiotracer activity in neck lymph nodes. Incidental CT finding: None. CHEST Marked interval enlargement of previous described RIGHT upper lobe pulmonary nodules. Nodules are now mass size measuring 7 cm x 5.5 cm on image 68 within the RIGHT upper lobe. This large RIGHT upper lobe mass has only mild radiotracer activity within 1 portion of the mass with SUV max equal 5.7. Incidental CT finding: None. ABDOMEN/PELVIS Prostate: No focal activity in the prostate bed. Lymph nodes: No abnormal radiotracer accumulation within pelvic or  abdominal nodes. Liver: There several large low-attenuation lesions within the liver without radiotracer activity. For example lesion in the superior aspect of the RIGHT hepatic lobe with measuring 5.1 cm without radiotracer activity. A similar lesion inferior RIGHT hepatic lobe measuring 3.9 cm on image 124. 7 lesions in the RIGHT hepatic lobe and 1 small in the LEFT hepatic lobe. Peripheral radiotracer activity in the liver is indeterminate and may relate to attenuation artifact. Incidental CT finding: None. SKELETON Interval progression  of radiotracer avid skeletal metastasis. For example new lesion in the central and RIGHT sacral with SUV max equal 13.1 on image 168. Multiple new lesions throughout the thoracic and lumbar spine. Example lesion at at L1 with SUV max equal 12.8. Lesion involves the T1 vertebral body extending into the adjacent SUV max equal 10.4. IMPRESSION: 1. Marked interval enlargement of RIGHT upper lobe pulmonary mass consistent with metastatic disease. Mass has low radiotracer activity indicating either non PSMA expressing prostate carcinoma versus a secondary malignancy. Patient does have a history of bladder carcinoma. Favor bladder carcinoma. 2. Interval development of multiple low-attenuation lesions within the liver without radiotracer activity. Findings are consistent with hepatic metastasis. Same differential as lung mass. Recommend tissue sampling of liver. 3. Interval progression of radiotracer avid prostate cancer skeletal metastasis. These results will be called to the ordering clinician or representative by the Radiologist Assistant, and communication documented in the PACS or [employer]. Electronically Signed   By: Genevive Bi M.D.   On: 05/05/2023 10:51    ASSESSMENT & PLAN:   89 y.o. male with:  1.  Castration-sensitive advanced prostate cancer with lymphadenopathy diagnosed in May 2023.    2.  Androgen deprivation: He will continue to receive Eligard every  6 months under the care of Dr. Cardell Peach and urology. Was last given in December 2023.    3.  Bone directed therapy: He is to continue calcium and vitamin D supplements.    4.  Bladder cancer: He continues to follow with urology regarding this issue.  He has carcinoma in situ on last cystoscopy.  5.  Hypertension: His blood pressure is elevated today and normally is during doctor's visits.  Ambulatory blood pressure monitoring per his report are low are close to normal range.  I have asked him to check his blood pressure periodically and address that with his primary care physician if he is continue to have elevated blood pressure at home.    6.  Urinary retention: Resolved and his catheter has been removed.  Continues to follow with Dr. Cardell Peach regarding this issue.   PLAN: -Discussed PET scan results from 04/27/2023 in detail with the patient. Showed 1. Marked interval enlargement of RIGHT upper lobe pulmonary mass consistent with metastatic disease. Mass has low radiotracer activity indicating either non PSMA expressing prostate carcinoma versus a secondary malignancy. Patient does have a history of bladder carcinoma. Favor bladder carcinoma. 2. Interval development of multiple low-attenuation lesions within the liver without radiotracer activity. Findings are consistent with hepatic metastasis. Same differential as lung mass. Recommend tissue sampling of liver. 3. Interval progression of radiotracer avid prostate cancer skeletal metastasis. -Discussed with the patient that there are different possibilities of diagnosis based on PET scan result: 1. Metastasis of Prostate cancer with potentially changed cell type since PSA levels not co-relating  2. Different primary cancer-- lung cancer vs GI/pancreatico-biliary. 3. Metastasis bladder cancer.  -Discussed with the patient that the next step to confirm the diagnosis would be biopsy of one of the masses in the liver.  -Discussed with the patient that we would  need Brain MRI due to vision changes.  -Discussed with the patient that treatment would include chemotherapy, radiation treatment, or immunotherapy. -Discussed the option of hospice or to proceed with biopsy, MRI brain, and palliative treatment .  -Discussed the option of getting referred to radiation oncologist to see if radiation treatment can help pt's localized pain, due to his carcinoma, or try to control his pain with medication.   -  Patient does not want to proceed with biopsy nor Brain MRI. He has also declined CT chest to further evaluate lung nodule over the last year. -Patient wants to proceed with hospice and adjust pain medication to address pain and constipation.  -Answered all of patient's questions.  -Will adjust pain medication today and refer/start patient on Hospice. Will prescribe dexamethasone and low-dose fentanyl patch. Can take hydrocodone 2 pills every 4-hours as needed for pain with the fentanyl patch.  -Discussed with the patient that if he takes more hydrocodone while using low-dose fentanyl patch, we will increase the fentanyl patch dosage. We can also change pain medication if needed.  -Discontinue Prednisone.  -Will prescribe nausea medication as well.  -Senna-S two pills twice day as needed for constipation. -Paper prescription.    FOLLOW-UP: Referral to Hospice  The total time spent in the appointment was 40 minutes* .  All of the patient's questions were answered with apparent satisfaction. The patient knows to call the clinic with any problems, questions or concerns.   Wyvonnia Lora MD MS AAHIVMS [facility] Hematology/Oncology Physician [facility]  .*Total Encounter Time as defined by the Centers for Medicare and Medicaid Services includes, in addition to the face-to-face time of a patient visit (documented in the note above) non-face-to-face time: obtaining and reviewing outside history, ordering and reviewing medications, tests or procedures,  care coordination (communications with other health care professionals or caregivers) and documentation in the medical record.   I,Param Shah,acting as a Neurosurgeon for Wyvonnia Lora, MD.,have documented all relevant documentation on the behalf of Wyvonnia Lora, MD,as directed by  Wyvonnia Lora, MD while in the presence of Wyvonnia Lora, MD.  .I have reviewed the above documentation for accuracy and completeness, and I agree with the above. Johney Maine MD

## 2023-05-11 DIAGNOSIS — J449 Chronic obstructive pulmonary disease, unspecified: Secondary | ICD-10-CM | POA: Diagnosis not present

## 2023-05-11 DIAGNOSIS — E785 Hyperlipidemia, unspecified: Secondary | ICD-10-CM | POA: Diagnosis not present

## 2023-05-11 DIAGNOSIS — I4891 Unspecified atrial fibrillation: Secondary | ICD-10-CM | POA: Diagnosis not present

## 2023-05-11 DIAGNOSIS — D075 Carcinoma in situ of prostate: Secondary | ICD-10-CM | POA: Diagnosis not present

## 2023-05-11 DIAGNOSIS — C7951 Secondary malignant neoplasm of bone: Secondary | ICD-10-CM | POA: Diagnosis not present

## 2023-05-11 DIAGNOSIS — E039 Hypothyroidism, unspecified: Secondary | ICD-10-CM | POA: Diagnosis not present

## 2023-05-11 DIAGNOSIS — I10 Essential (primary) hypertension: Secondary | ICD-10-CM | POA: Diagnosis not present

## 2023-05-11 DIAGNOSIS — N189 Chronic kidney disease, unspecified: Secondary | ICD-10-CM | POA: Diagnosis not present

## 2023-05-11 DIAGNOSIS — I2510 Atherosclerotic heart disease of native coronary artery without angina pectoris: Secondary | ICD-10-CM | POA: Diagnosis not present

## 2023-05-11 DIAGNOSIS — C775 Secondary and unspecified malignant neoplasm of intrapelvic lymph nodes: Secondary | ICD-10-CM | POA: Diagnosis not present

## 2023-05-11 DIAGNOSIS — C787 Secondary malignant neoplasm of liver and intrahepatic bile duct: Secondary | ICD-10-CM | POA: Diagnosis not present

## 2023-05-11 DIAGNOSIS — G893 Neoplasm related pain (acute) (chronic): Secondary | ICD-10-CM | POA: Diagnosis not present

## 2023-05-12 DIAGNOSIS — D075 Carcinoma in situ of prostate: Secondary | ICD-10-CM | POA: Diagnosis not present

## 2023-05-12 DIAGNOSIS — I4891 Unspecified atrial fibrillation: Secondary | ICD-10-CM | POA: Diagnosis not present

## 2023-05-12 DIAGNOSIS — I10 Essential (primary) hypertension: Secondary | ICD-10-CM | POA: Diagnosis not present

## 2023-05-12 DIAGNOSIS — N189 Chronic kidney disease, unspecified: Secondary | ICD-10-CM | POA: Diagnosis not present

## 2023-05-12 DIAGNOSIS — E039 Hypothyroidism, unspecified: Secondary | ICD-10-CM | POA: Diagnosis not present

## 2023-05-12 DIAGNOSIS — E785 Hyperlipidemia, unspecified: Secondary | ICD-10-CM | POA: Diagnosis not present

## 2023-05-15 ENCOUNTER — Other Ambulatory Visit: Payer: Self-pay

## 2023-05-15 NOTE — Progress Notes (Signed)
 Patient is entering hospice. Zytiga is discontinued.

## 2023-05-16 DIAGNOSIS — N189 Chronic kidney disease, unspecified: Secondary | ICD-10-CM | POA: Diagnosis not present

## 2023-05-16 DIAGNOSIS — I4891 Unspecified atrial fibrillation: Secondary | ICD-10-CM | POA: Diagnosis not present

## 2023-05-16 DIAGNOSIS — D075 Carcinoma in situ of prostate: Secondary | ICD-10-CM | POA: Diagnosis not present

## 2023-05-16 DIAGNOSIS — E039 Hypothyroidism, unspecified: Secondary | ICD-10-CM | POA: Diagnosis not present

## 2023-05-16 DIAGNOSIS — E785 Hyperlipidemia, unspecified: Secondary | ICD-10-CM | POA: Diagnosis not present

## 2023-05-16 DIAGNOSIS — I10 Essential (primary) hypertension: Secondary | ICD-10-CM | POA: Diagnosis not present

## 2023-05-17 DIAGNOSIS — D075 Carcinoma in situ of prostate: Secondary | ICD-10-CM | POA: Diagnosis not present

## 2023-05-17 DIAGNOSIS — I4891 Unspecified atrial fibrillation: Secondary | ICD-10-CM | POA: Diagnosis not present

## 2023-05-17 DIAGNOSIS — I10 Essential (primary) hypertension: Secondary | ICD-10-CM | POA: Diagnosis not present

## 2023-05-17 DIAGNOSIS — E785 Hyperlipidemia, unspecified: Secondary | ICD-10-CM | POA: Diagnosis not present

## 2023-05-17 DIAGNOSIS — N189 Chronic kidney disease, unspecified: Secondary | ICD-10-CM | POA: Diagnosis not present

## 2023-05-17 DIAGNOSIS — E039 Hypothyroidism, unspecified: Secondary | ICD-10-CM | POA: Diagnosis not present

## 2023-05-18 ENCOUNTER — Inpatient Hospital Stay: Payer: Medicare Other

## 2023-05-18 DIAGNOSIS — N189 Chronic kidney disease, unspecified: Secondary | ICD-10-CM | POA: Diagnosis not present

## 2023-05-18 DIAGNOSIS — I4891 Unspecified atrial fibrillation: Secondary | ICD-10-CM | POA: Diagnosis not present

## 2023-05-18 DIAGNOSIS — D075 Carcinoma in situ of prostate: Secondary | ICD-10-CM | POA: Diagnosis not present

## 2023-05-18 DIAGNOSIS — E039 Hypothyroidism, unspecified: Secondary | ICD-10-CM | POA: Diagnosis not present

## 2023-05-18 DIAGNOSIS — E785 Hyperlipidemia, unspecified: Secondary | ICD-10-CM | POA: Diagnosis not present

## 2023-05-18 DIAGNOSIS — I10 Essential (primary) hypertension: Secondary | ICD-10-CM | POA: Diagnosis not present

## 2023-05-24 DIAGNOSIS — N189 Chronic kidney disease, unspecified: Secondary | ICD-10-CM | POA: Diagnosis not present

## 2023-05-24 DIAGNOSIS — I4891 Unspecified atrial fibrillation: Secondary | ICD-10-CM | POA: Diagnosis not present

## 2023-05-24 DIAGNOSIS — I10 Essential (primary) hypertension: Secondary | ICD-10-CM | POA: Diagnosis not present

## 2023-05-24 DIAGNOSIS — E785 Hyperlipidemia, unspecified: Secondary | ICD-10-CM | POA: Diagnosis not present

## 2023-05-24 DIAGNOSIS — D075 Carcinoma in situ of prostate: Secondary | ICD-10-CM | POA: Diagnosis not present

## 2023-05-24 DIAGNOSIS — E039 Hypothyroidism, unspecified: Secondary | ICD-10-CM | POA: Diagnosis not present

## 2023-05-25 DIAGNOSIS — E039 Hypothyroidism, unspecified: Secondary | ICD-10-CM | POA: Diagnosis not present

## 2023-05-25 DIAGNOSIS — I10 Essential (primary) hypertension: Secondary | ICD-10-CM | POA: Diagnosis not present

## 2023-05-25 DIAGNOSIS — N189 Chronic kidney disease, unspecified: Secondary | ICD-10-CM | POA: Diagnosis not present

## 2023-05-25 DIAGNOSIS — E785 Hyperlipidemia, unspecified: Secondary | ICD-10-CM | POA: Diagnosis not present

## 2023-05-25 DIAGNOSIS — I4891 Unspecified atrial fibrillation: Secondary | ICD-10-CM | POA: Diagnosis not present

## 2023-05-25 DIAGNOSIS — D075 Carcinoma in situ of prostate: Secondary | ICD-10-CM | POA: Diagnosis not present

## 2023-05-30 DIAGNOSIS — I4891 Unspecified atrial fibrillation: Secondary | ICD-10-CM | POA: Diagnosis not present

## 2023-05-30 DIAGNOSIS — G893 Neoplasm related pain (acute) (chronic): Secondary | ICD-10-CM | POA: Diagnosis not present

## 2023-05-30 DIAGNOSIS — D075 Carcinoma in situ of prostate: Secondary | ICD-10-CM | POA: Diagnosis not present

## 2023-05-30 DIAGNOSIS — N189 Chronic kidney disease, unspecified: Secondary | ICD-10-CM | POA: Diagnosis not present

## 2023-05-30 DIAGNOSIS — I10 Essential (primary) hypertension: Secondary | ICD-10-CM | POA: Diagnosis not present

## 2023-05-30 DIAGNOSIS — I2510 Atherosclerotic heart disease of native coronary artery without angina pectoris: Secondary | ICD-10-CM | POA: Diagnosis not present

## 2023-05-30 DIAGNOSIS — C787 Secondary malignant neoplasm of liver and intrahepatic bile duct: Secondary | ICD-10-CM | POA: Diagnosis not present

## 2023-05-30 DIAGNOSIS — C775 Secondary and unspecified malignant neoplasm of intrapelvic lymph nodes: Secondary | ICD-10-CM | POA: Diagnosis not present

## 2023-05-30 DIAGNOSIS — E785 Hyperlipidemia, unspecified: Secondary | ICD-10-CM | POA: Diagnosis not present

## 2023-05-30 DIAGNOSIS — E039 Hypothyroidism, unspecified: Secondary | ICD-10-CM | POA: Diagnosis not present

## 2023-05-30 DIAGNOSIS — C7951 Secondary malignant neoplasm of bone: Secondary | ICD-10-CM | POA: Diagnosis not present

## 2023-05-30 DIAGNOSIS — J449 Chronic obstructive pulmonary disease, unspecified: Secondary | ICD-10-CM | POA: Diagnosis not present

## 2023-05-31 DIAGNOSIS — D075 Carcinoma in situ of prostate: Secondary | ICD-10-CM | POA: Diagnosis not present

## 2023-05-31 DIAGNOSIS — E039 Hypothyroidism, unspecified: Secondary | ICD-10-CM | POA: Diagnosis not present

## 2023-05-31 DIAGNOSIS — E785 Hyperlipidemia, unspecified: Secondary | ICD-10-CM | POA: Diagnosis not present

## 2023-05-31 DIAGNOSIS — N189 Chronic kidney disease, unspecified: Secondary | ICD-10-CM | POA: Diagnosis not present

## 2023-05-31 DIAGNOSIS — I10 Essential (primary) hypertension: Secondary | ICD-10-CM | POA: Diagnosis not present

## 2023-05-31 DIAGNOSIS — I4891 Unspecified atrial fibrillation: Secondary | ICD-10-CM | POA: Diagnosis not present

## 2023-06-02 ENCOUNTER — Other Ambulatory Visit: Payer: Self-pay | Admitting: Hematology

## 2023-06-02 DIAGNOSIS — I10 Essential (primary) hypertension: Secondary | ICD-10-CM | POA: Diagnosis not present

## 2023-06-02 DIAGNOSIS — E785 Hyperlipidemia, unspecified: Secondary | ICD-10-CM | POA: Diagnosis not present

## 2023-06-02 DIAGNOSIS — N189 Chronic kidney disease, unspecified: Secondary | ICD-10-CM | POA: Diagnosis not present

## 2023-06-02 DIAGNOSIS — I4891 Unspecified atrial fibrillation: Secondary | ICD-10-CM | POA: Diagnosis not present

## 2023-06-02 DIAGNOSIS — D075 Carcinoma in situ of prostate: Secondary | ICD-10-CM | POA: Diagnosis not present

## 2023-06-02 DIAGNOSIS — E039 Hypothyroidism, unspecified: Secondary | ICD-10-CM | POA: Diagnosis not present

## 2023-06-06 DIAGNOSIS — I10 Essential (primary) hypertension: Secondary | ICD-10-CM | POA: Diagnosis not present

## 2023-06-06 DIAGNOSIS — E039 Hypothyroidism, unspecified: Secondary | ICD-10-CM | POA: Diagnosis not present

## 2023-06-06 DIAGNOSIS — I4891 Unspecified atrial fibrillation: Secondary | ICD-10-CM | POA: Diagnosis not present

## 2023-06-06 DIAGNOSIS — D075 Carcinoma in situ of prostate: Secondary | ICD-10-CM | POA: Diagnosis not present

## 2023-06-06 DIAGNOSIS — N189 Chronic kidney disease, unspecified: Secondary | ICD-10-CM | POA: Diagnosis not present

## 2023-06-06 DIAGNOSIS — E785 Hyperlipidemia, unspecified: Secondary | ICD-10-CM | POA: Diagnosis not present

## 2023-06-07 DIAGNOSIS — I4891 Unspecified atrial fibrillation: Secondary | ICD-10-CM | POA: Diagnosis not present

## 2023-06-07 DIAGNOSIS — N189 Chronic kidney disease, unspecified: Secondary | ICD-10-CM | POA: Diagnosis not present

## 2023-06-07 DIAGNOSIS — E785 Hyperlipidemia, unspecified: Secondary | ICD-10-CM | POA: Diagnosis not present

## 2023-06-07 DIAGNOSIS — D075 Carcinoma in situ of prostate: Secondary | ICD-10-CM | POA: Diagnosis not present

## 2023-06-07 DIAGNOSIS — E039 Hypothyroidism, unspecified: Secondary | ICD-10-CM | POA: Diagnosis not present

## 2023-06-07 DIAGNOSIS — I10 Essential (primary) hypertension: Secondary | ICD-10-CM | POA: Diagnosis not present

## 2023-06-09 DIAGNOSIS — I4891 Unspecified atrial fibrillation: Secondary | ICD-10-CM | POA: Diagnosis not present

## 2023-06-09 DIAGNOSIS — I10 Essential (primary) hypertension: Secondary | ICD-10-CM | POA: Diagnosis not present

## 2023-06-09 DIAGNOSIS — E039 Hypothyroidism, unspecified: Secondary | ICD-10-CM | POA: Diagnosis not present

## 2023-06-09 DIAGNOSIS — E785 Hyperlipidemia, unspecified: Secondary | ICD-10-CM | POA: Diagnosis not present

## 2023-06-09 DIAGNOSIS — N189 Chronic kidney disease, unspecified: Secondary | ICD-10-CM | POA: Diagnosis not present

## 2023-06-09 DIAGNOSIS — D075 Carcinoma in situ of prostate: Secondary | ICD-10-CM | POA: Diagnosis not present

## 2023-06-12 DIAGNOSIS — E785 Hyperlipidemia, unspecified: Secondary | ICD-10-CM | POA: Diagnosis not present

## 2023-06-12 DIAGNOSIS — I4891 Unspecified atrial fibrillation: Secondary | ICD-10-CM | POA: Diagnosis not present

## 2023-06-12 DIAGNOSIS — E039 Hypothyroidism, unspecified: Secondary | ICD-10-CM | POA: Diagnosis not present

## 2023-06-12 DIAGNOSIS — I10 Essential (primary) hypertension: Secondary | ICD-10-CM | POA: Diagnosis not present

## 2023-06-12 DIAGNOSIS — N189 Chronic kidney disease, unspecified: Secondary | ICD-10-CM | POA: Diagnosis not present

## 2023-06-12 DIAGNOSIS — D075 Carcinoma in situ of prostate: Secondary | ICD-10-CM | POA: Diagnosis not present

## 2023-06-13 DIAGNOSIS — D075 Carcinoma in situ of prostate: Secondary | ICD-10-CM | POA: Diagnosis not present

## 2023-06-13 DIAGNOSIS — N189 Chronic kidney disease, unspecified: Secondary | ICD-10-CM | POA: Diagnosis not present

## 2023-06-13 DIAGNOSIS — E785 Hyperlipidemia, unspecified: Secondary | ICD-10-CM | POA: Diagnosis not present

## 2023-06-13 DIAGNOSIS — I10 Essential (primary) hypertension: Secondary | ICD-10-CM | POA: Diagnosis not present

## 2023-06-13 DIAGNOSIS — E039 Hypothyroidism, unspecified: Secondary | ICD-10-CM | POA: Diagnosis not present

## 2023-06-13 DIAGNOSIS — I4891 Unspecified atrial fibrillation: Secondary | ICD-10-CM | POA: Diagnosis not present

## 2023-06-14 DIAGNOSIS — E039 Hypothyroidism, unspecified: Secondary | ICD-10-CM | POA: Diagnosis not present

## 2023-06-14 DIAGNOSIS — E785 Hyperlipidemia, unspecified: Secondary | ICD-10-CM | POA: Diagnosis not present

## 2023-06-14 DIAGNOSIS — I10 Essential (primary) hypertension: Secondary | ICD-10-CM | POA: Diagnosis not present

## 2023-06-14 DIAGNOSIS — D075 Carcinoma in situ of prostate: Secondary | ICD-10-CM | POA: Diagnosis not present

## 2023-06-14 DIAGNOSIS — I4891 Unspecified atrial fibrillation: Secondary | ICD-10-CM | POA: Diagnosis not present

## 2023-06-14 DIAGNOSIS — N189 Chronic kidney disease, unspecified: Secondary | ICD-10-CM | POA: Diagnosis not present

## 2023-06-15 DIAGNOSIS — N189 Chronic kidney disease, unspecified: Secondary | ICD-10-CM | POA: Diagnosis not present

## 2023-06-15 DIAGNOSIS — D075 Carcinoma in situ of prostate: Secondary | ICD-10-CM | POA: Diagnosis not present

## 2023-06-15 DIAGNOSIS — E039 Hypothyroidism, unspecified: Secondary | ICD-10-CM | POA: Diagnosis not present

## 2023-06-15 DIAGNOSIS — I10 Essential (primary) hypertension: Secondary | ICD-10-CM | POA: Diagnosis not present

## 2023-06-15 DIAGNOSIS — I4891 Unspecified atrial fibrillation: Secondary | ICD-10-CM | POA: Diagnosis not present

## 2023-06-15 DIAGNOSIS — E785 Hyperlipidemia, unspecified: Secondary | ICD-10-CM | POA: Diagnosis not present

## 2023-06-16 DIAGNOSIS — E785 Hyperlipidemia, unspecified: Secondary | ICD-10-CM | POA: Diagnosis not present

## 2023-06-16 DIAGNOSIS — D075 Carcinoma in situ of prostate: Secondary | ICD-10-CM | POA: Diagnosis not present

## 2023-06-16 DIAGNOSIS — N189 Chronic kidney disease, unspecified: Secondary | ICD-10-CM | POA: Diagnosis not present

## 2023-06-16 DIAGNOSIS — I4891 Unspecified atrial fibrillation: Secondary | ICD-10-CM | POA: Diagnosis not present

## 2023-06-16 DIAGNOSIS — I10 Essential (primary) hypertension: Secondary | ICD-10-CM | POA: Diagnosis not present

## 2023-06-16 DIAGNOSIS — E039 Hypothyroidism, unspecified: Secondary | ICD-10-CM | POA: Diagnosis not present

## 2023-06-17 DIAGNOSIS — D075 Carcinoma in situ of prostate: Secondary | ICD-10-CM | POA: Diagnosis not present

## 2023-06-17 DIAGNOSIS — E785 Hyperlipidemia, unspecified: Secondary | ICD-10-CM | POA: Diagnosis not present

## 2023-06-17 DIAGNOSIS — I4891 Unspecified atrial fibrillation: Secondary | ICD-10-CM | POA: Diagnosis not present

## 2023-06-17 DIAGNOSIS — I10 Essential (primary) hypertension: Secondary | ICD-10-CM | POA: Diagnosis not present

## 2023-06-17 DIAGNOSIS — N189 Chronic kidney disease, unspecified: Secondary | ICD-10-CM | POA: Diagnosis not present

## 2023-06-17 DIAGNOSIS — E039 Hypothyroidism, unspecified: Secondary | ICD-10-CM | POA: Diagnosis not present

## 2023-06-18 DIAGNOSIS — E039 Hypothyroidism, unspecified: Secondary | ICD-10-CM | POA: Diagnosis not present

## 2023-06-18 DIAGNOSIS — D075 Carcinoma in situ of prostate: Secondary | ICD-10-CM | POA: Diagnosis not present

## 2023-06-18 DIAGNOSIS — I10 Essential (primary) hypertension: Secondary | ICD-10-CM | POA: Diagnosis not present

## 2023-06-18 DIAGNOSIS — I4891 Unspecified atrial fibrillation: Secondary | ICD-10-CM | POA: Diagnosis not present

## 2023-06-18 DIAGNOSIS — N189 Chronic kidney disease, unspecified: Secondary | ICD-10-CM | POA: Diagnosis not present

## 2023-06-18 DIAGNOSIS — E785 Hyperlipidemia, unspecified: Secondary | ICD-10-CM | POA: Diagnosis not present

## 2023-06-21 ENCOUNTER — Other Ambulatory Visit: Payer: Medicare Other

## 2023-06-21 ENCOUNTER — Ambulatory Visit: Payer: Medicare Other | Admitting: Hematology

## 2023-06-22 ENCOUNTER — Telehealth: Payer: Self-pay | Admitting: Family Medicine

## 2023-06-22 NOTE — Telephone Encounter (Signed)
 Called and LMOVM for daughter.   Called his son, condolences given.  I always enjoyed seeing this kind patient in clinic and he will be missed.  Son thanked me for the call.

## 2023-06-26 ENCOUNTER — Telehealth: Payer: Self-pay

## 2023-06-26 NOTE — Telephone Encounter (Signed)
 Copied from CRM [id number]. Topic: General - Other >> Jun 23, 2023  3:51 PM Adonis Hoot wrote: Reason for CRM: Patients daughter called in to thank Dr Vallarie Gauze for his condolences on the passing of her dad. She wanted him to know that she received the voicemail and thanks him.

## 2023-06-26 NOTE — Telephone Encounter (Signed)
 Noted and thanks.  I was glad to see this kind gentleman.

## 2023-06-29 DEATH — deceased

## 2024-02-15 IMAGING — CT NM PET TUM IMG SKULL BASE T - THIGH
7 series · 23 of 25 positions shown · non-contrast
Comparison: Previous CT imaging from June 18, 2021.

CLINICAL DATA: Newly diagnosed high-risk prostate cancer in an
87-year-old male with positive LEFT pelvic lymph node biopsy and
prostate biopsy.

EXAM:
NUCLEAR MEDICINE PET SKULL BASE TO THIGH
TECHNIQUE: 8.4 mCi F18 Piflufolastat (Pylarify) was injected intravenously.
Full-ring PET imaging was performed from the skull base to thigh
after the radiotracer. CT data was obtained and used for attenuation
correction and anatomic localization.

[Series 3: pet sk_thigh ac · axial · 5.0mm · 4.07mm/px · z∈[-1010,+6]mm · 6 of 255 slices shown]
[im 1/255]
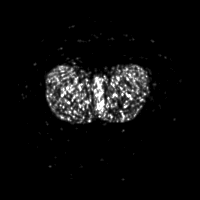
[im 51/255]
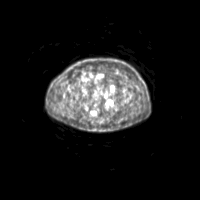
[im 102/255]
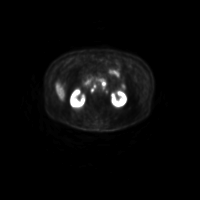
[im 153/255]
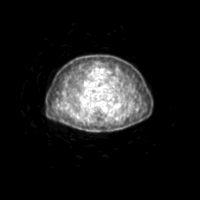
[im 204/255]
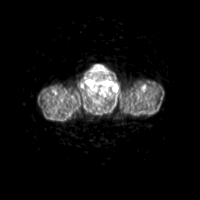
[im 255/255]
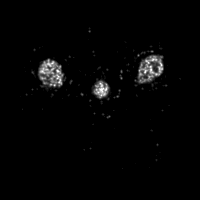

[Series 4: ct sk_thigh 5.0 br38 · axial · 5.0mm · 0.98mm/px · z∈[-758,+6]mm · 4 of 255 slices shown]
[im 64/255]
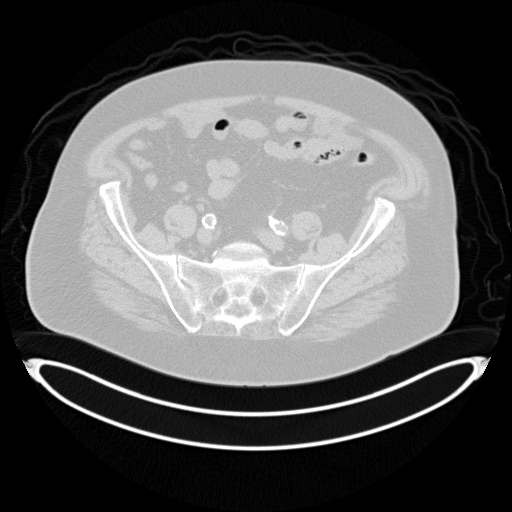
[im 128/255]
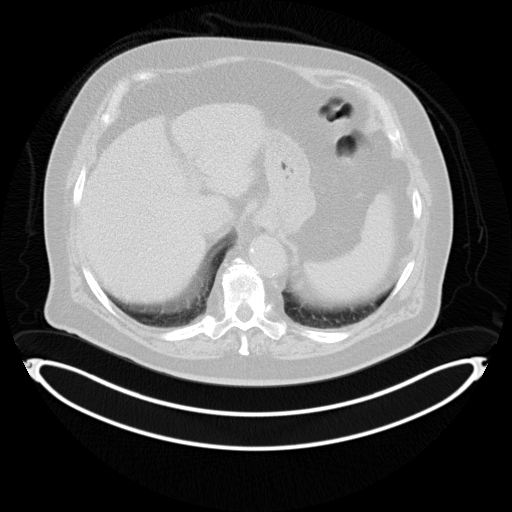
[im 191/255]
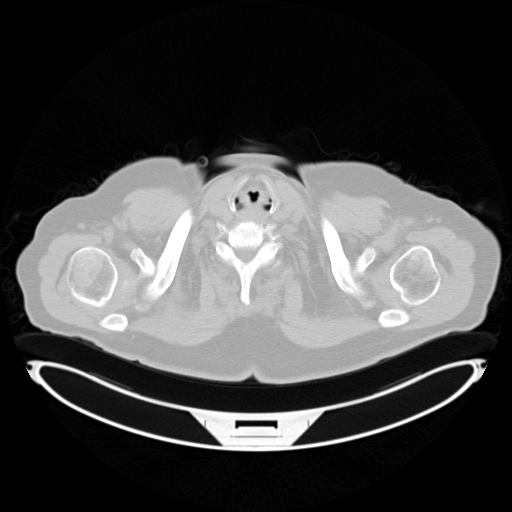
[im 255/255]
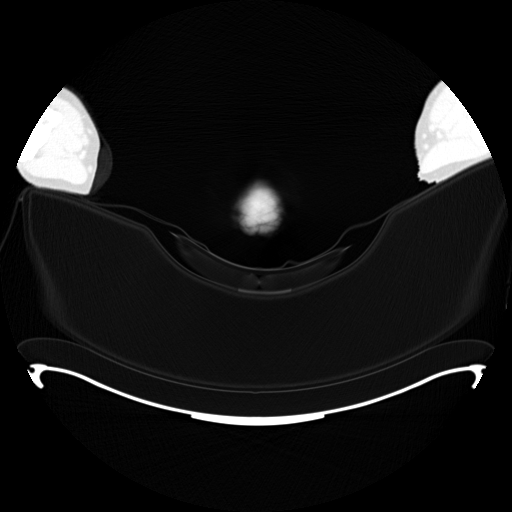

[Series 5: pet sk_thigh nac · axial · 5.0mm · 4.07mm/px · z∈[-1010,+6]mm · 5 of 255 slices shown]
[im 1/255]
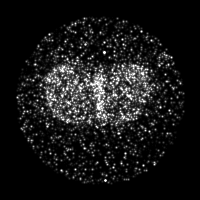
[im 64/255]
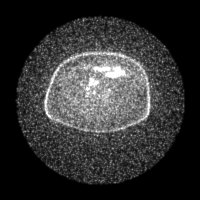
[im 128/255]
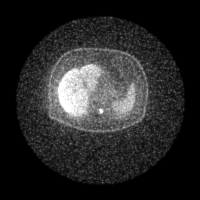
[im 191/255]
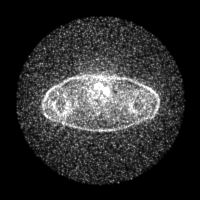
[im 255/255]
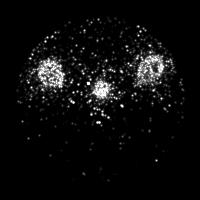

[Series 7: ct 5.0 bl57 lung_bone · axial · 5.0mm · 0.66mm/px · 1 of 63 slices shown]
[im 1/63]
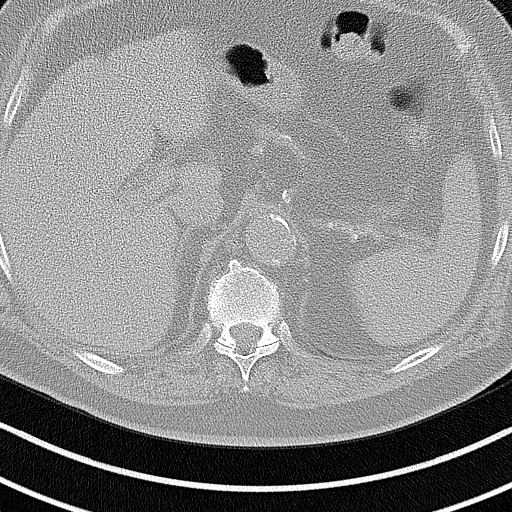

[Series 604: fused tra · 4 of 235 slices shown]
[im 1/235]
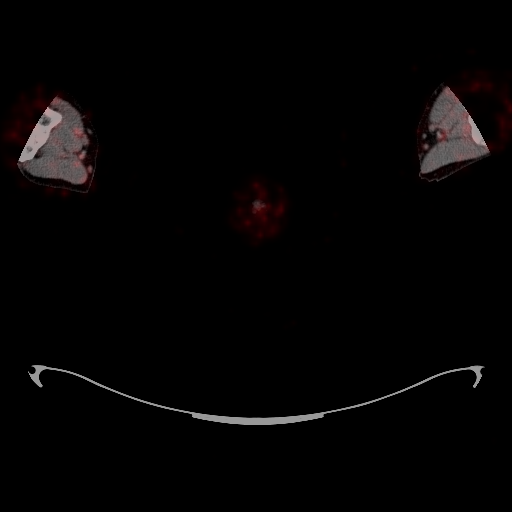
[im 118/235]
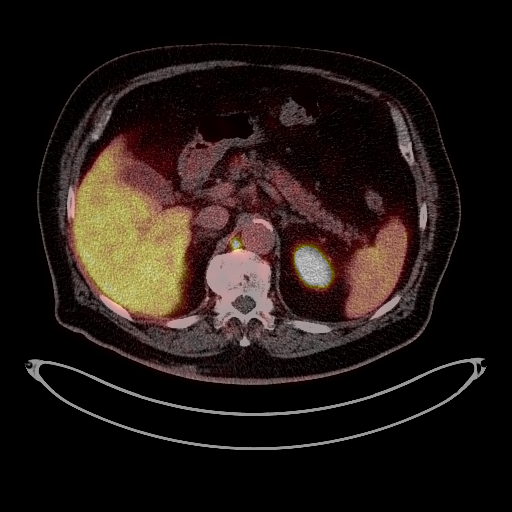
[im 176/235]
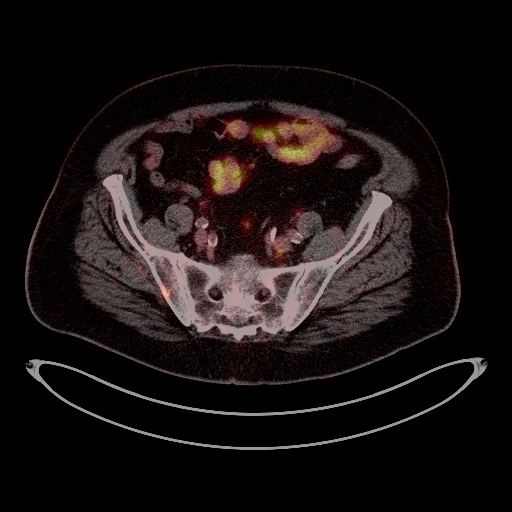
[im 235/235]
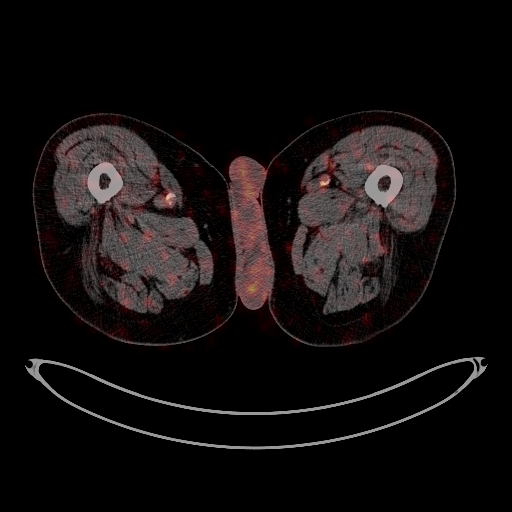

[Series 605: fused cor · 2 of 82 slices shown]
[im 1/82]
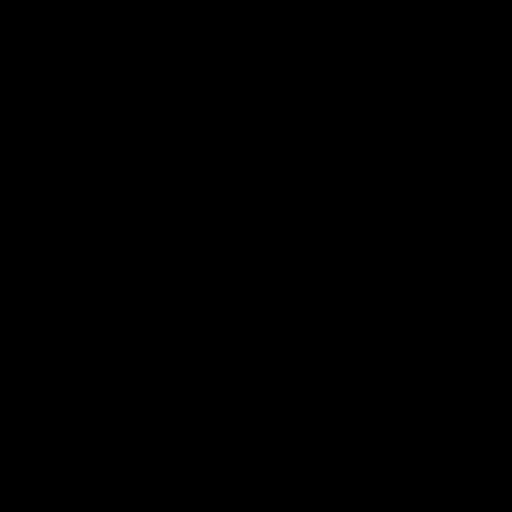
[im 82/82]
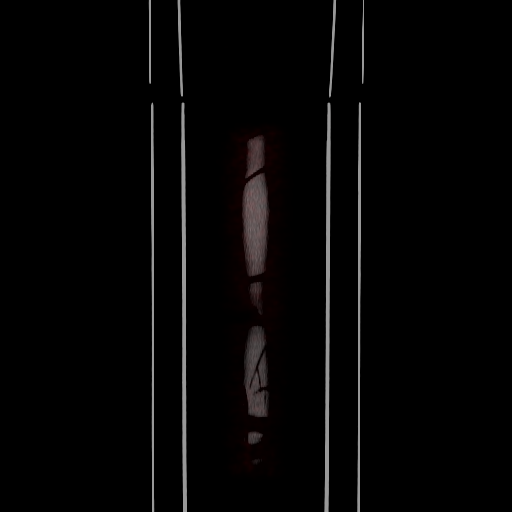

[Series 606: mip pet · coronal · 2.11mm/px · 1 of 32 slices shown]
[im 1/32]
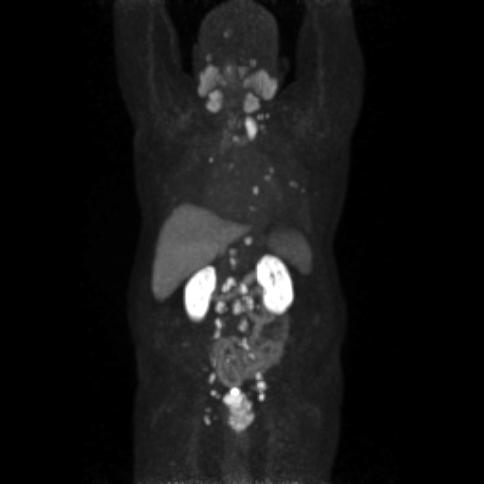

[23 of 25 positions shown; findings below may reference images not displayed]

FINDINGS: NECK

Signs of supraclavicular and low neck adenopathy (image 67/4 and
image 63/4. LEFT supraclavicular lymph node measuring 11 mm with a
maximum SUV of 39.1. Smaller less tracer avid lymph nodes on image
63 measuring approximately 4-5 mm though clearly with increased
radiotracer accumulation. No adenopathy by size criteria in the
neck. Signs of calvarial metastatic disease, see below.

Incidental CT finding: Sinus disease of RIGHT maxillary sinus is
mild.

CHEST

Multifocal nodal metastases in the chest and signs of scattered
small pulmonary nodules.

LEFT hilar lymph node (image 94/4) not clearly visible as a discrete
lymph node but with increased radiotracer accumulation showing
maximum SUV 7.87.

Juxta aortic lymph node (image 100/4) maximum SUV 16.3 lymph node
measuring approximately 5-6 mm.

Scattered smaller lymph nodes with limited radiotracer accumulation
(image 89/4) RIGHT paratracheal lymph node with mild radiotracer
accumulation.

Tiny pulmonary nodule RIGHT upper lobe (image 81/4) maximum SUV
measuring 3 mm.

Tiny pulmonary nodule LEFT lower lobe (image 99/4) no substantial
radiotracer accumulation.

Branching and nodular changes in the RIGHT upper lobe seen on
previous chest imaging from [DATE]st shows a maximum SUV of
(image 84/4) this area measuring in total 4 cm and approximally
cm greatest thickness.

Incidental CT finding: Post median sternotomy. Cardiac enlargement
is moderate to marked with four-chamber enlargement. Calcified
atherosclerosis and signs of coronary artery disease post CABG and
percutaneous coronary intervention. No adenopathy by size criteria
in the chest. No effusion or consolidation.

ABDOMEN/PELVIS

Prostate: Focal activity in the prostate bed with maximum SUV of
28.56 extending into the seminal vesicles and extending throughout
the posterior prostate from apex through the base and seminal
vesicles.

Lymph nodes: Multifocal nodal metastatic disease including
retrocrural, retroperitoneal and pelvic metastases.

Intra-aortocaval lymph node (image 151/4) maximum SUV of 35
measuring 11 mm short axis.

RIGHT retrocrural lymph node with slightly less radiotracer
accumulation. Lymph nodes tracking throughout the retroperitoneum
most less than a cm all showing increased radiotracer accumulation
greater than 20 for maximum SUV.

LEFT pelvic lymph node along the external iliac chain as the largest
of visible pelvic lymph nodes at 14 mm with a maximum SUV of 41.2.
There are bilateral iliac lymph nodes with involvement and bilateral
pelvic sidewall lymph nodes with involvement all smaller than the
above lymph node but displaying marked radiotracer accumulation.

Similar size pelvic lymph node in the mesorectum (image 207/4) 14 mm
maximum SUV 90.45.

Liver: Subtle area of hypodensity in the RIGHT hepatic lobe (image
141/4) no discrete increased radiotracer activity associated with
this area.

Incidental CT finding: Lobular hepatic contours. No pericholecystic
stranding.

No acute findings relative to pancreas, spleen, adrenal glands or of
the kidneys. Renal cysts which require no dedicated follow-up are
present in the LEFT kidney largest in the lateral upper pole
measuring 3 cm. Urinary bladder is thickened in collapsed.

No signs of acute gastrointestinal process. Diverticulosis of the
colon. Small hiatal hernia.

Aortic atherosclerosis post endovascular repair maximal caliber
cm is unchanged compared to [REDACTED]. The bi iliac endograft
placement as well with similar appearance.

SKELETON

Multifocal bony metastatic disease involving the calvarium LEFT
sixth rib, multiple sites in the spine, sites in the RIGHT
hemipelvis and LEFT sacral ala.

Calvarial lesion (image [DATE]) maximum SUV of 6.88.

T1 lesion on the RIGHT (image 73/4) 10 mm maximum SUV 11.1.

LEFT lateral and posterior sixth rib largest area (image 98/4)
maximum SUV of 5.89 without discrete visible lesion.

T10 vertebral body with subtle area of mixed sclerosis and lytic
change (image 127/4) 14 mm with a maximum SUV of 21.3. Similar
radiotracer accumulation in the LEFT L3 transverse process.

Slightly less radiotracer accumulation in LEFT sacral ala at the
superior margin of the sacral ala. Moderate uptake in the RIGHT
posterior iliac.
IMPRESSION: Signs of prostate cancer with extensive disease in the prostate,
extending into seminal vesicles and associated with multifocal nodal
and bony metastatic lesions.

Nodal disease in the chest, abdomen and pelvis as described.

No definitive signs of liver involvement. Subtle hypoattenuation in
the RIGHT hemiliver with signs of liver disease warrants attention
on subsequent imaging but shows no PSMA accumulation, potentially
variable fat deposition as there was no clear evidence of lesion in
this location on previous imaging from [DATE].

Aortic atherosclerosis, cardiomegaly and signs of aorto bi-iliac
endo grafting as discussed.

RIGHT upper lobe nodular area potentially post infectious or
inflammatory though without increased radiotracer accumulation.
Differential would include infectious or inflammatory changes
potentially atypical mycobacterial versus is bronchogenic neoplasm.
Comparison with more remote imaging may be helpful. Short interval
follow-up or FDG PET could be considered as warranted for further
assessment.
